# Patient Record
Sex: Male | Born: 1935 | Race: White | Hispanic: No | Marital: Married | Smoking: Never smoker
Health system: Southern US, Community
[De-identification: ages and names within clinical notes are randomized; demographics above are authoritative.]

## PROBLEM LIST (undated history)

## (undated) DIAGNOSIS — T4145XA Adverse effect of unspecified anesthetic, initial encounter: Secondary | ICD-10-CM

## (undated) DIAGNOSIS — M1710 Unilateral primary osteoarthritis, unspecified knee: Secondary | ICD-10-CM

## (undated) DIAGNOSIS — I259 Chronic ischemic heart disease, unspecified: Secondary | ICD-10-CM

## (undated) DIAGNOSIS — I10 Essential (primary) hypertension: Secondary | ICD-10-CM

## (undated) DIAGNOSIS — I4510 Unspecified right bundle-branch block: Secondary | ICD-10-CM

## (undated) DIAGNOSIS — H9190 Unspecified hearing loss, unspecified ear: Secondary | ICD-10-CM

## (undated) DIAGNOSIS — R600 Localized edema: Secondary | ICD-10-CM

## (undated) DIAGNOSIS — M179 Osteoarthritis of knee, unspecified: Secondary | ICD-10-CM

## (undated) DIAGNOSIS — B958 Unspecified staphylococcus as the cause of diseases classified elsewhere: Secondary | ICD-10-CM

## (undated) DIAGNOSIS — C801 Malignant (primary) neoplasm, unspecified: Secondary | ICD-10-CM

## (undated) DIAGNOSIS — J9601 Acute respiratory failure with hypoxia: Secondary | ICD-10-CM

## (undated) DIAGNOSIS — R0600 Dyspnea, unspecified: Secondary | ICD-10-CM

## (undated) DIAGNOSIS — Z87438 Personal history of other diseases of male genital organs: Secondary | ICD-10-CM

## (undated) DIAGNOSIS — N189 Chronic kidney disease, unspecified: Secondary | ICD-10-CM

## (undated) DIAGNOSIS — I2510 Atherosclerotic heart disease of native coronary artery without angina pectoris: Secondary | ICD-10-CM

## (undated) DIAGNOSIS — R0609 Other forms of dyspnea: Secondary | ICD-10-CM

## (undated) DIAGNOSIS — T8859XA Other complications of anesthesia, initial encounter: Secondary | ICD-10-CM

## (undated) DIAGNOSIS — J101 Influenza due to other identified influenza virus with other respiratory manifestations: Secondary | ICD-10-CM

## (undated) DIAGNOSIS — R609 Edema, unspecified: Secondary | ICD-10-CM

## (undated) DIAGNOSIS — N289 Disorder of kidney and ureter, unspecified: Secondary | ICD-10-CM

## (undated) DIAGNOSIS — I509 Heart failure, unspecified: Secondary | ICD-10-CM

## (undated) DIAGNOSIS — I214 Non-ST elevation (NSTEMI) myocardial infarction: Secondary | ICD-10-CM

## (undated) DIAGNOSIS — J189 Pneumonia, unspecified organism: Secondary | ICD-10-CM

## (undated) DIAGNOSIS — J45909 Unspecified asthma, uncomplicated: Secondary | ICD-10-CM

## (undated) DIAGNOSIS — I252 Old myocardial infarction: Secondary | ICD-10-CM

## (undated) HISTORY — PX: TOE AMPUTATION: SHX809

## (undated) HISTORY — PX: BILATERAL CARPAL TUNNEL RELEASE: SHX6508

## (undated) HISTORY — PX: JOINT REPLACEMENT: SHX530

## (undated) HISTORY — DX: Other forms of dyspnea: R06.09

## (undated) HISTORY — DX: Unspecified right bundle-branch block: I45.10

## (undated) HISTORY — DX: Edema, unspecified: R60.9

## (undated) HISTORY — PX: TRANSURETHRAL RESECTION OF PROSTATE: SHX73

## (undated) HISTORY — PX: FRACTURE SURGERY: SHX138

## (undated) HISTORY — PX: BUNIONECTOMY: SHX129

## (undated) HISTORY — DX: Old myocardial infarction: I25.2

## (undated) HISTORY — DX: Localized edema: R60.0

## (undated) HISTORY — PX: TOTAL KNEE ARTHROPLASTY: SHX125

## (undated) HISTORY — DX: Personal history of other diseases of male genital organs: Z87.438

## (undated) HISTORY — PX: BACK SURGERY: SHX140

## (undated) HISTORY — DX: Dyspnea, unspecified: R06.00

## (undated) HISTORY — DX: Chronic kidney disease, unspecified: N18.9

## (undated) HISTORY — DX: Essential (primary) hypertension: I10

## (undated) HISTORY — DX: Chronic ischemic heart disease, unspecified: I25.9

## (undated) HISTORY — PX: OTHER SURGICAL HISTORY: SHX169

## (undated) HISTORY — DX: Unilateral primary osteoarthritis, unspecified knee: M17.10

## (undated) HISTORY — DX: Osteoarthritis of knee, unspecified: M17.9

---

## 1996-01-21 HISTORY — PX: KNEE SURGERY: SHX244

## 1997-07-28 ENCOUNTER — Ambulatory Visit (HOSPITAL_COMMUNITY): Admission: RE | Admit: 1997-07-28 | Discharge: 1997-07-28 | Payer: Self-pay | Admitting: Orthopedic Surgery

## 1998-02-16 ENCOUNTER — Encounter: Payer: Self-pay | Admitting: Neurosurgery

## 1998-02-20 ENCOUNTER — Ambulatory Visit (HOSPITAL_COMMUNITY): Admission: RE | Admit: 1998-02-20 | Discharge: 1998-02-20 | Payer: Self-pay | Admitting: Neurosurgery

## 1998-07-03 ENCOUNTER — Ambulatory Visit (HOSPITAL_COMMUNITY): Admission: RE | Admit: 1998-07-03 | Discharge: 1998-07-04 | Payer: Self-pay | Admitting: Neurosurgery

## 1999-03-31 ENCOUNTER — Encounter: Payer: Self-pay | Admitting: Neurosurgery

## 1999-03-31 ENCOUNTER — Ambulatory Visit (HOSPITAL_COMMUNITY): Admission: RE | Admit: 1999-03-31 | Discharge: 1999-03-31 | Payer: Self-pay | Admitting: Neurosurgery

## 1999-04-12 ENCOUNTER — Encounter: Payer: Self-pay | Admitting: Neurosurgery

## 1999-04-16 ENCOUNTER — Encounter: Payer: Self-pay | Admitting: Neurosurgery

## 1999-04-16 ENCOUNTER — Inpatient Hospital Stay (HOSPITAL_COMMUNITY): Admission: RE | Admit: 1999-04-16 | Discharge: 1999-04-18 | Payer: Self-pay | Admitting: Neurosurgery

## 1999-04-21 ENCOUNTER — Emergency Department (HOSPITAL_COMMUNITY): Admission: EM | Admit: 1999-04-21 | Discharge: 1999-04-21 | Payer: Self-pay | Admitting: Emergency Medicine

## 1999-04-21 ENCOUNTER — Encounter: Payer: Self-pay | Admitting: Emergency Medicine

## 1999-05-01 ENCOUNTER — Encounter: Admission: RE | Admit: 1999-05-01 | Discharge: 1999-05-01 | Payer: Self-pay | Admitting: Neurosurgery

## 1999-05-01 ENCOUNTER — Encounter: Payer: Self-pay | Admitting: Neurosurgery

## 2000-12-15 ENCOUNTER — Encounter: Payer: Self-pay | Admitting: Family Medicine

## 2000-12-15 ENCOUNTER — Ambulatory Visit (HOSPITAL_COMMUNITY): Admission: RE | Admit: 2000-12-15 | Discharge: 2000-12-15 | Payer: Self-pay | Admitting: Family Medicine

## 2003-06-23 ENCOUNTER — Ambulatory Visit (HOSPITAL_COMMUNITY): Admission: RE | Admit: 2003-06-23 | Discharge: 2003-06-23 | Payer: Self-pay | Admitting: Neurosurgery

## 2003-07-04 ENCOUNTER — Inpatient Hospital Stay (HOSPITAL_COMMUNITY): Admission: RE | Admit: 2003-07-04 | Discharge: 2003-07-07 | Payer: Self-pay | Admitting: Neurosurgery

## 2003-10-14 ENCOUNTER — Ambulatory Visit (HOSPITAL_COMMUNITY): Admission: RE | Admit: 2003-10-14 | Discharge: 2003-10-14 | Payer: Self-pay | Admitting: Neurosurgery

## 2003-10-22 ENCOUNTER — Ambulatory Visit: Payer: Self-pay | Admitting: Internal Medicine

## 2003-10-22 ENCOUNTER — Inpatient Hospital Stay (HOSPITAL_COMMUNITY): Admission: AD | Admit: 2003-10-22 | Discharge: 2003-10-28 | Payer: Self-pay | Admitting: Neurosurgery

## 2003-11-28 ENCOUNTER — Ambulatory Visit: Payer: Self-pay | Admitting: Internal Medicine

## 2004-01-19 ENCOUNTER — Inpatient Hospital Stay (HOSPITAL_COMMUNITY): Admission: RE | Admit: 2004-01-19 | Discharge: 2004-01-23 | Payer: Self-pay | Admitting: Neurosurgery

## 2004-07-11 ENCOUNTER — Other Ambulatory Visit: Admission: RE | Admit: 2004-07-11 | Discharge: 2004-07-11 | Payer: Self-pay | Admitting: Dermatology

## 2004-08-20 ENCOUNTER — Encounter: Admission: RE | Admit: 2004-08-20 | Discharge: 2004-08-20 | Payer: Self-pay | Admitting: Otolaryngology

## 2004-10-07 ENCOUNTER — Ambulatory Visit: Payer: Self-pay | Admitting: Internal Medicine

## 2004-11-13 ENCOUNTER — Inpatient Hospital Stay (HOSPITAL_COMMUNITY): Admission: RE | Admit: 2004-11-13 | Discharge: 2004-11-16 | Payer: Self-pay | Admitting: Orthopedic Surgery

## 2005-01-07 ENCOUNTER — Ambulatory Visit: Payer: Self-pay | Admitting: Internal Medicine

## 2005-04-22 ENCOUNTER — Ambulatory Visit: Payer: Self-pay | Admitting: Internal Medicine

## 2005-09-30 ENCOUNTER — Ambulatory Visit (HOSPITAL_COMMUNITY): Admission: RE | Admit: 2005-09-30 | Discharge: 2005-09-30 | Payer: Self-pay | Admitting: Neurosurgery

## 2005-10-21 ENCOUNTER — Inpatient Hospital Stay (HOSPITAL_COMMUNITY): Admission: RE | Admit: 2005-10-21 | Discharge: 2005-10-25 | Payer: Self-pay | Admitting: Neurosurgery

## 2006-02-25 ENCOUNTER — Encounter (HOSPITAL_COMMUNITY): Admission: RE | Admit: 2006-02-25 | Discharge: 2006-03-27 | Payer: Self-pay | Admitting: Neurosurgery

## 2006-04-07 ENCOUNTER — Encounter (HOSPITAL_COMMUNITY): Admission: RE | Admit: 2006-04-07 | Discharge: 2006-05-07 | Payer: Self-pay | Admitting: Neurosurgery

## 2006-05-08 ENCOUNTER — Encounter (HOSPITAL_COMMUNITY): Admission: RE | Admit: 2006-05-08 | Discharge: 2006-06-07 | Payer: Self-pay | Admitting: Neurosurgery

## 2006-08-24 ENCOUNTER — Ambulatory Visit: Payer: Self-pay | Admitting: Internal Medicine

## 2006-08-25 ENCOUNTER — Ambulatory Visit: Payer: Self-pay | Admitting: Internal Medicine

## 2006-10-16 ENCOUNTER — Ambulatory Visit: Payer: Self-pay | Admitting: Internal Medicine

## 2006-10-23 ENCOUNTER — Encounter: Admission: RE | Admit: 2006-10-23 | Discharge: 2006-10-23 | Payer: Self-pay | Admitting: Cardiology

## 2007-03-21 DIAGNOSIS — I252 Old myocardial infarction: Secondary | ICD-10-CM

## 2007-03-21 HISTORY — DX: Old myocardial infarction: I25.2

## 2007-03-23 ENCOUNTER — Encounter: Payer: Self-pay | Admitting: Cardiology

## 2007-03-23 ENCOUNTER — Inpatient Hospital Stay (HOSPITAL_COMMUNITY): Admission: AD | Admit: 2007-03-23 | Discharge: 2007-03-28 | Payer: Self-pay | Admitting: Cardiology

## 2007-03-24 HISTORY — PX: CARDIAC CATHETERIZATION: SHX172

## 2007-04-15 ENCOUNTER — Encounter (HOSPITAL_COMMUNITY): Admission: RE | Admit: 2007-04-15 | Discharge: 2007-05-15 | Payer: Self-pay | Admitting: *Deleted

## 2007-05-17 ENCOUNTER — Encounter (HOSPITAL_COMMUNITY): Admission: RE | Admit: 2007-05-17 | Discharge: 2007-06-16 | Payer: Self-pay | Admitting: Cardiology

## 2007-06-18 ENCOUNTER — Encounter (HOSPITAL_COMMUNITY): Admission: RE | Admit: 2007-06-18 | Discharge: 2007-07-18 | Payer: Self-pay | Admitting: Cardiology

## 2007-07-19 ENCOUNTER — Encounter (HOSPITAL_COMMUNITY): Admission: RE | Admit: 2007-07-19 | Discharge: 2007-08-18 | Payer: Self-pay | Admitting: Cardiology

## 2008-02-28 ENCOUNTER — Ambulatory Visit (HOSPITAL_COMMUNITY): Admission: RE | Admit: 2008-02-28 | Discharge: 2008-02-28 | Payer: Self-pay | Admitting: Neurosurgery

## 2008-04-14 ENCOUNTER — Inpatient Hospital Stay (HOSPITAL_COMMUNITY): Admission: RE | Admit: 2008-04-14 | Discharge: 2008-04-15 | Payer: Self-pay | Admitting: Neurosurgery

## 2008-11-03 ENCOUNTER — Inpatient Hospital Stay (HOSPITAL_COMMUNITY): Admission: RE | Admit: 2008-11-03 | Discharge: 2008-11-06 | Payer: Self-pay | Admitting: Orthopedic Surgery

## 2009-03-22 ENCOUNTER — Encounter (HOSPITAL_COMMUNITY): Admission: RE | Admit: 2009-03-22 | Discharge: 2009-04-21 | Payer: Self-pay | Admitting: Orthopedic Surgery

## 2009-04-02 HISTORY — PX: CARDIOVASCULAR STRESS TEST: SHX262

## 2009-04-04 HISTORY — PX: TRANSTHORACIC ECHOCARDIOGRAM: SHX275

## 2009-12-05 ENCOUNTER — Ambulatory Visit: Payer: Self-pay | Admitting: Cardiology

## 2010-02-09 ENCOUNTER — Encounter: Payer: Self-pay | Admitting: Family Medicine

## 2010-04-04 ENCOUNTER — Ambulatory Visit (INDEPENDENT_AMBULATORY_CARE_PROVIDER_SITE_OTHER): Payer: PRIVATE HEALTH INSURANCE | Admitting: Cardiology

## 2010-04-04 DIAGNOSIS — I119 Hypertensive heart disease without heart failure: Secondary | ICD-10-CM

## 2010-04-04 DIAGNOSIS — E7800 Pure hypercholesterolemia, unspecified: Secondary | ICD-10-CM

## 2010-04-04 DIAGNOSIS — Z79899 Other long term (current) drug therapy: Secondary | ICD-10-CM

## 2010-04-25 LAB — TYPE AND SCREEN: ABO/RH(D): O POS

## 2010-04-25 LAB — COMPREHENSIVE METABOLIC PANEL
ALT: 26 U/L (ref 0–53)
Alkaline Phosphatase: 133 U/L — ABNORMAL HIGH (ref 39–117)
CO2: 28 mEq/L (ref 19–32)
Chloride: 105 mEq/L (ref 96–112)
Glucose, Bld: 106 mg/dL — ABNORMAL HIGH (ref 70–99)
Potassium: 4.7 mEq/L (ref 3.5–5.1)
Sodium: 141 mEq/L (ref 135–145)
Total Protein: 6.7 g/dL (ref 6.0–8.3)

## 2010-04-25 LAB — CBC
Hemoglobin: 14.4 g/dL (ref 13.0–17.0)
MCHC: 33.6 g/dL (ref 30.0–36.0)
Platelets: 143 10*3/uL — ABNORMAL LOW (ref 150–400)
RBC: 2.78 MIL/uL — ABNORMAL LOW (ref 4.22–5.81)
RBC: 2.84 MIL/uL — ABNORMAL LOW (ref 4.22–5.81)
RBC: 3.14 MIL/uL — ABNORMAL LOW (ref 4.22–5.81)
RBC: 4.46 MIL/uL (ref 4.22–5.81)
RDW: 15.1 % (ref 11.5–15.5)
RDW: 14.3 % (ref 11.5–15.5)
WBC: 6 10*3/uL (ref 4.0–10.5)
WBC: 7.1 10*3/uL (ref 4.0–10.5)
WBC: 6.3 10*3/uL (ref 4.0–10.5)

## 2010-04-25 LAB — BASIC METABOLIC PANEL
BUN: 46 mg/dL — ABNORMAL HIGH (ref 6–23)
CO2: 23 mEq/L (ref 19–32)
Calcium: 7.6 mg/dL — ABNORMAL LOW (ref 8.4–10.5)
Creatinine, Ser: 2.63 mg/dL — ABNORMAL HIGH (ref 0.4–1.5)
Creatinine, Ser: 2.8 mg/dL — ABNORMAL HIGH (ref 0.4–1.5)
GFR calc Af Amer: 27 mL/min — ABNORMAL LOW (ref >60)
GFR calc Af Amer: 29 mL/min — ABNORMAL LOW (ref >60)
GFR calc non Af Amer: 22 mL/min — ABNORMAL LOW (ref >60)
GFR calc non Af Amer: 24 mL/min — ABNORMAL LOW (ref >60)
Potassium: 4 mEq/L (ref 3.5–5.1)
Sodium: 139 mEq/L (ref 135–145)

## 2010-04-25 LAB — URINALYSIS, ROUTINE W REFLEX MICROSCOPIC
Glucose, UA: NEGATIVE mg/dL
Protein, ur: 30 mg/dL — AB

## 2010-04-25 LAB — PROTIME-INR
INR: 1.37 (ref 0.00–1.49)
INR: 2.49 — ABNORMAL HIGH (ref 0.00–1.49)
INR: 2.54 — ABNORMAL HIGH (ref 0.00–1.49)
Prothrombin Time: 16.8 seconds — ABNORMAL HIGH (ref 11.6–15.2)
Prothrombin Time: 26.7 seconds — ABNORMAL HIGH (ref 11.6–15.2)
Prothrombin Time: 27.1 seconds — ABNORMAL HIGH (ref 11.6–15.2)
Prothrombin Time: 13.9 seconds (ref 11.6–15.2)

## 2010-04-25 LAB — POCT I-STAT 4, (NA,K, GLUC, HGB,HCT)
Glucose, Bld: 123 mg/dL — ABNORMAL HIGH (ref 70–99)
HCT: 29 % — ABNORMAL LOW (ref 39.0–52.0)
Hemoglobin: 9.9 g/dL — ABNORMAL LOW (ref 13.0–17.0)

## 2010-04-25 LAB — PREPARE RBC (CROSSMATCH)

## 2010-04-25 LAB — URINE MICROSCOPIC-ADD ON

## 2010-05-02 LAB — COMPREHENSIVE METABOLIC PANEL
AST: 29 U/L (ref 0–37)
Albumin: 3.8 g/dL (ref 3.5–5.2)
Alkaline Phosphatase: 130 U/L — ABNORMAL HIGH (ref 39–117)
BUN: 34 mg/dL — ABNORMAL HIGH (ref 6–23)
CO2: 30 mEq/L (ref 19–32)
Chloride: 106 mEq/L (ref 96–112)
GFR calc non Af Amer: 27 mL/min — ABNORMAL LOW (ref >60)
Potassium: 5.2 mEq/L — ABNORMAL HIGH (ref 3.5–5.1)
Total Bilirubin: 0.7 mg/dL (ref 0.3–1.2)

## 2010-05-02 LAB — CBC
HCT: 43.8 % (ref 39.0–52.0)
Platelets: 209 10*3/uL (ref 150–400)
RBC: 4.54 MIL/uL (ref 4.22–5.81)
WBC: 6.3 10*3/uL (ref 4.0–10.5)

## 2010-05-02 LAB — DIFFERENTIAL
Basophils Absolute: 0.1 10*3/uL (ref 0.0–0.1)
Basophils Relative: 1 % (ref 0–1)
Eosinophils Relative: 3 % (ref 0–5)
Monocytes Absolute: 0.6 10*3/uL (ref 0.1–1.0)

## 2010-05-02 LAB — URINALYSIS, ROUTINE W REFLEX MICROSCOPIC
Bilirubin Urine: NEGATIVE
Glucose, UA: NEGATIVE mg/dL
Hgb urine dipstick: NEGATIVE
Ketones, ur: NEGATIVE mg/dL
pH: 6 (ref 5.0–8.0)

## 2010-05-02 LAB — POCT I-STAT 4, (NA,K, GLUC, HGB,HCT)
Glucose, Bld: 105 mg/dL — ABNORMAL HIGH (ref 70–99)
HCT: 44 % (ref 39.0–52.0)
Hemoglobin: 15 g/dL (ref 13.0–17.0)
Potassium: 4.4 mEq/L (ref 3.5–5.1)

## 2010-05-16 ENCOUNTER — Other Ambulatory Visit: Payer: Self-pay | Admitting: Cardiology

## 2010-05-16 DIAGNOSIS — I259 Chronic ischemic heart disease, unspecified: Secondary | ICD-10-CM

## 2010-05-16 DIAGNOSIS — I10 Essential (primary) hypertension: Secondary | ICD-10-CM

## 2010-05-17 NOTE — Telephone Encounter (Signed)
escribe request  

## 2010-05-31 ENCOUNTER — Other Ambulatory Visit: Payer: Self-pay | Admitting: *Deleted

## 2010-05-31 DIAGNOSIS — Z01818 Encounter for other preprocedural examination: Secondary | ICD-10-CM

## 2010-05-31 DIAGNOSIS — I252 Old myocardial infarction: Secondary | ICD-10-CM

## 2010-06-04 NOTE — Assessment & Plan Note (Signed)
[facility]                             PULMONARY OFFICE NOTE   NAME:[name] [name] LASHLEY                      MRN:          [id number]  DATE:10/16/2006                            DOB:          May 07, 1928    PROBLEM:  1. Chronic bronchitis.  2. History of lymphoma.  3. Question mediastinal adenopathy.  4. Allergic rhinitis.   HISTORY:  He and his wife indicate that he has really done pretty well  this summer with no special flare-up in the harvest season, apparently  because it has been wetter.  Less routine cough or phlegm.  He remembers  Advair as not helpful in the past and we note again his history of  childhood pneumonia.   MEDICATIONS:  1. Ziac 5 mg.  2. Allopurinol 100 mg.   ALLERGIES:  No medication allergy.   Chest x-ray:  August 24, 2006 film - no acute findings.  Lungs are  somewhat low-volume with left-base scarring.  Surgical changes in the  spine.   Pulmonary function tests on August 25, 2006, showed moderate obstructive  airways disease, especially in small area where there was response to  bronchodilator.  Measured lung volumes were normal.  Diffusion capacity  was normal.   On a six-minute walk test, he went 333 meters with normal heart rate and  oxygenation.  He held his oxygen saturation 96-98% throughout.   OBJECTIVE:  Weight 170 pounds, BP 140/82, pulse 54, room air saturation  99%.  Chronic postoperative changes on the right side of the face  persist.  Heart sounds regular.  Lungs are clear with somewhat  diminished breath sounds.   IMPRESSION:  1. History of lymphoma.  2. Asthmatic bronchitis/COPD, clinically stable.  3. Allergic rhinitis.   PLAN:  Sample albuterol inhaler with education and discussion for trial.  Flu vaccine was discussed and given.  Schedule return one year, earlier  p.r.n.     Matthew D. Maple Hudson, MD, [name] [name], FACP  Electronically Signed    CDY/MedQ  DD: 10/16/2006  DT: 10/17/2006  Job #:  [id number]   cc:   [name] [name], M.D.  [name] Callander. Ouida Sills, MD

## 2010-06-04 NOTE — Discharge Summary (Signed)
[name], [name]               ACCOUNT NO.:  [id number]   MEDICAL RECORD NO.:  [id number]          PATIENT TYPE:  INP   LOCATION:  4715                         FACILITY:  [facility]   PHYSICIAN:  [name], M.D. DATE OF BIRTH:  09/10/1928   DATE OF ADMISSION:  03/23/2007  DATE OF DISCHARGE:  03/28/2007                               DISCHARGE SUMMARY   FINAL DIAGNOSES:  1. Acute anterior wall myocardial infarction, with late presentation.  2. Chronic right bundle branch block.  3. Chronic renal insufficiency.  4. History of gout.  5. History of multiple previous operations on his spine.  6. Remote history of lymphoma of the right temporal area in 1979.   OPERATIONS PERFORMED:  1. Echocardiogram.  2. Left heart cardiac catheterization on March 24, 2007 by Dr. Roger Shelter, with no left ventricular angiogram performed because of      renal insufficiency.   HISTORY:  This is a 77 year old married Caucasian gentleman from  [place] who is admitted with chest pain and a suspected recent  anterior myocardial infarction.  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  He has not had any history of known coronary  artery disease.  In September 2006, he had a normal adenosine Cardiolite  stress test as preoperative clearance for hip surgery.  At that time, he  was noted to have a right bundle branch block, and his ejection fraction  at that time was 59%.  The patient had an episode of chest tightness  which prevented him from falling asleep on Friday evening, March 19, 2007.  He was in [place] visiting relatives at that time.  He drove  back to [place] the following day.  On March 22, 2006, he again  had chest discomfort in the evening and was able to fall asleep because  of chest tightness.  The discomfort  lasted most of the night, and he  came to the office as a work-in the next day, and his EKG showed  evidence of a new anterior wall myocardial infarction, with Q-waves  already present in leads V1-V3.   SOCIAL HISTORY:  Reveals that he does not drink alcohol or smoke.  He is  a retired Engineer, maintenance.  He is married, and his wife as a Engineer, civil (consulting).   FAMILY HISTORY:  Reveals his father died at 79 and had a pacemaker.  Mother died at 53 and had coronary disease.   PHYSICAL EXAMINATION ON ADMISSION IN THE OFFICE:  VITAL SIGNS:  Blood  pressure 130/84, pulse of 60 and regular, respirations are normal.  Jugular venous pressure normal.  HEAD/NECK:  Revealed scarring in the right temporal area from the  previous lymphoma excision.  CHEST:  Clear to percussion and auscultation.  HEART:  Reveals no murmur, gallop, rub, or click.  ABDOMEN:  Reveals no hepatosplenomegaly or mass.  EXTREMITIES:  Show 1+ pedal pulses.  No phlebitis or edema.  HOSPITAL COURSE:  The patient was admitted from my office to telemetry.  He was treated with IV nitroglycerin, IV heparin, Lopressor, and  aspirin.  He was begun on a Mucomyst protocol to prepare him for cardiac  catheterization the following day.  His Ziac was held.  Because of his  renal insufficiency and the risk of angiographic dye, we limited his  cardiac catheterization to coronaries only.  His left ventricular  function was assessed by echocardiogram on the afternoon of admission,  and it did show akinesis of the anterior wall, consistent with a recent  anterior wall myocardial infarction.  On the day after admission, after  IV hydration and after Mucomyst protocol, the patient underwent cardiac  catheterization, which showed that he did have a 100% occlusion of his  LAD before the first septal perforator.  He did not have any significant  obstructive disease of his other vessels.  There was collateral filling  from a large dominant right coronary artery,  with two large vessels on  the inferior wall supplying collaterals to the LAD, with incomplete  filling.  It was felt that medical management would be prudent.  The  patient was continued on aspirin and Plavix.  Postcatheterization, the  patient did well,  He did not have any groin complications.  His renal  function had improved prior to catheterization, with creatinine dropping  to 1.92.  At the time of discharge, BUN is 38, creatinine 2.25,  potassium 4.2.  The patient's activity was gradually increased.  He did  not show any evidence of congestive heart failure, and he had no  arrhythmias.  Cardiac rehab work with him, and he did well.  The plan  will be that he will join the [facility] cardiac rehab program in  several weeks.   By March 28, 2007, he was felt stable for discharge.  Chest x-ray that  morning showed no evidence of CHF, and his B natruretic peptide had  dropped to 945.   The patient is to increase activity slowly at home.  He may walk up  steps.  He is not to drive for several weeks.  He is to see Dr.  Patty Sermons in several weeks for a follow-up office visit, EKG, CBC, and  BMET.  We will call him with that appointment.   DISCHARGE MEDICATIONS:  1. Metoprolol 25 mg 1/2 tablet twice a day.  2. Coated aspirin 325, 1 daily.  3. Colchicine 0.6 mg daily.  4. Lipitor 80 mg daily.  5. Imdur 30 mg each morning.  6. Plavix 75 mg daily.  7. Nitrostat 1/50 sublingually p.r.n.  8. Allopurinol 100 mg daily.  9. Tylenol p.r.n. for pain.  10.He is to stop taking his Ziac.   CONDITION ON DISCHARGE:  Improved.           ______________________________  [name], M.D.     TB/MEDQ  D:  03/28/2007  T:  03/29/2007  Job:  [id number]   cc:   Colleen Can. Deborah Chalk, M.D.  Payton Doughty, M.D.

## 2010-06-04 NOTE — H&P (Signed)
NAMEDANDRE, SISLER               ACCOUNT NO.:  [id number]   MEDICAL RECORD NO.:  [id number]          PATIENT TYPE:  AMB   LOCATION:  SDS                          FACILITY:  [facility]   PHYSICIAN:  Payton Doughty, M.D.      DATE OF BIRTH:  November 01, 1928   DATE OF ADMISSION:  04/14/2008  DATE OF DISCHARGE:                              HISTORY & PHYSICAL   ADMISSION DIAGNOSIS:  Right peroneal neuropathy.   BODY OF TEXT:  This is a 77 year old gentleman who has had anterior  decompression and fusion, lumbar decompression and fusion, infected hip  replacement, redo hip replacement.  He has been developing dorsiflexion  and weakness on the right side.  MRI and CT of his spine did show any  compressive pathology.  EMG and nerve conduction studies demonstrated  pronator neuropathy at the fibular head on the right and he is now  admitted for peroneal nerve decompression.   MEDICAL HISTORY:  Remarkable for coronary artery disease.  He has had an  MI about a year ago.   He is on Toprol and several other cardiac medicines.   He is allergic to GENTAMICIN.   He had a history of renal failure secondary to gentamicin when he had  his infected hip.   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   REVIEW OF SYSTEMS:  Remarkable for leg pain, dorsiflexion, and weakness  on the right side.   PHYSICAL EXAMINATION:  HEENT:  Within normal limits.  NECK:  He has good range of motion in his neck.  CHEST:  Clear.  CARDIAC:  Bradycardic, but regular at this time.  ABDOMEN:  Nontender with hepatosplenomegaly.  EXTREMITIES:  No clubbing or cyanosis.  GU:  Deferred.  Peripheral pulses are good.  NEUROLOGIC:  He is awake, alert, and oriented.  Cranial nerves are  intact.  Motor exam shows 5/5 strength throughout the upper extremities,  save for the grip where he has bilateral interosseous weakness.  He has  had an ulnar nerve transposition as well.  LOWER EXTREMITIES:  Left  lower is pretty good strength.  Right side, he  has about 4/5 weakness in the dorsiflexors in the right and foot slap  when he walks.  This is all obviously complicated by his hip, with  limited range of motion of his right hip.  Although it is not bothering  him very much now.   CLINICAL IMPRESSION:  Right peroneal neuropathy.   PLAN:  Decompression of peroneal nerve with fibular head.  The risks and  benefits have been discussed and they wished to proceed.           ______________________________  Payton Doughty, M.D.     MWR/MEDQ  D:  04/14/2008  T:  04/14/2008  Job:  [id number]

## 2010-06-04 NOTE — Cardiovascular Report (Signed)
NAMEMOOSA, BUECHE               ACCOUNT NO.:  [id number]   MEDICAL RECORD NO.:  [id number]          PATIENT TYPE:  INP   LOCATION:  4715                         FACILITY:  [facility]   PHYSICIAN:  Colleen Can. Deborah Chalk, M.D.DATE OF BIRTH:  07/27/1928   DATE OF PROCEDURE:  03/24/2007  DATE OF DISCHARGE:                            CARDIAC CATHETERIZATION   PROCEDURE:  Left heart catheterization with coronary arteriograms.  No  left ventricular angiogram performed.   SITE:  Right femoral artery.   CATHETERS:  5-French, four curved Judkins left and right coronary  catheters; 5-French pigtail ventriculographic catheter.   CONTRAST MATERIAL:  Omnipaque.   MEDICATIONS PRIOR TO PROCEDURE:  Valium 10 mg.   MEDICATIONS DURING PROCEDURE:  None.   COMMENT:  The patient tolerated the procedure well.   HEMODYNAMIC DATA:  The aortic pressure was 134/77, LV is 127/15-26.   ANGIOGRAPHIC DATA:  1. Left main coronary artery is normal.  2. Left anterior descending is totally occluded before the first      septal perforating branch.  There are left-to-left collaterals with      intermittent filling of the left anterior descending as well as      right to left collaterals.  In a somewhat intermittent fashion, we      have filling of the distal left anterior descending.  3. Left circumflex continues predominantly as a relatively large,      trifurcating obtuse marginal branch.  There is 30% narrowing      approximately 2-cm from the origin.  At the level of the      trifurcation, there is another 30-40% narrowing.  There is some      scattered irregularities at the level of the trifurcation, but no      significant obstructive disease is present in the left circumflex.  4. Right coronary artery.  The right coronary artery is a very large      dominant vessel.  He has to large branches on the inferior wall.      There is a large posterior lateral branch and large posterior      descending vessel and  then at least three smaller branches on the      inferior wall.  At the level of the ostium of the posterior      descending branch, there appears to be a 30% narrowing.  There is      collaterals to the left anterior descending predominantly through      the septal perforating branches.  5. Left main coronary artery is normal.   IMPRESSION:  1. Totally occluded left anterior descending proximally before the      first septal perforating branch.  2. Mild coronary atherosclerosis in the right coronary artery and left      circumflex with incomplete collateral flow to the left anterior      descending.   DISCUSSION:  In light of the reports of the echocardiogram with anterior  wall being nonfunctioning, I think that we are basically dealing with a  completed anterior myocardial infarction of relatively significant size.  Unless there are  recurrent symptoms or objective signs of ischemia, I do  not think much will be accomplished with recanalization at this point 5  days out from his myocardial infarction.      Colleen Can. Deborah Chalk, M.D.  Electronically Signed     SNT/MEDQ  D:  03/24/2007  T:  03/25/2007  Job:  [id number]

## 2010-06-04 NOTE — H&P (Signed)
[name], [name]               ACCOUNT NO.:  [id number]   MEDICAL RECORD NO.:  [id number]          PATIENT TYPE:  INP   LOCATION:  4715                         FACILITY:  [facility]   PHYSICIAN:  [name], M.D. DATE OF BIRTH:  10-28-1928   DATE OF ADMISSION:  03/23/2007  DATE OF DISCHARGE:                              HISTORY & PHYSICAL   CHIEF COMPLAINT:  Chest pain.   HISTORY:  This is a 77 year old married Caucasian gentleman admitted  with chest pain and suspected recent anteroseptal myocardial infarction.  He has a past history of essential hypertension and history of chronic  renal insufficiency secondary to prior high-dose gentamicin for  osteomyelitis of his lumbar spine in 1978.  He does not have any history  of known coronary artery disease.  On October 10, 2004, he had a  normal adenosine Cardiolite stress test as preoperative clearance for  hip surgery, and at that time he did have a right bundle branch block  and had left ventricular ejection fraction of 59%.  Last Friday,  March 19, 2007, he was in [place] with his wife.  He was unable to  fall asleep because of chest tightness.  He was able to drive back to  [place] the following day.  Last night again he had difficulty  sleeping because of chest pressure and tightness. There was no dyspnea  at the time, no nausea, vomiting or diaphoresis.  He does give a history  of over the past several months having some occasional chest tightness  when he walks.  The patient came to the office today because of last  night's chest pain, and his EKG showed right bundle branch block with  new ST-segment elevation in the inner septal leads and new Q waves in V1-  V3.   PRESENT MEDICATIONS:  1. Ziac 5 mg daily.  2. Allegra 180 mg p.r.n.  3. Allopurinol 100 mg daily.  4. Colchicine 0.6 mg daily.  5. He also has some eye drops from his eye doctor on hand for p.r.n.      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   PAST MEDICAL HISTORY:  Positive for hypertension and renal  insufficiency. His serum creatinine is usually around 2.2 with a BUN in  the high 40s or low 50s.   PAST SURGICAL HISTORY:  Is extensive. He estimates he has had about 24  surgeries since 1978. He did have removal of an lymphoma from his right  temporal artery area in 1979, and this was treated at [facility] initially  with removal of the lymphoma tissue followed by radiation and followed  by chemotherapy. He has had multiple orthopedic procedures, most  recently a right hip replacement by Dr. Lequita Halt in October 2006. He had  a TURP in 1998 per Dr. Darvin Neighbours. He has had right total hip replacement  in 1983 with revision in 1994. He had carpal tunnel release on the left  in 1997. He had left total knee replacement in 1998.  He had bunion  surgery 1999, hand surgery in 2000, left arm surgery in 2000, three-  level  cervical fusion in 2001, right ankle surgery in 2002, thoracic and  lumbar surgery in 2005, amputation of the left fourth toe in 2005, and  second lumbar surgery with fusion and screws and ray cages in December  2005.   SOCIAL HISTORY:  Reveals that he is married.  He is a nonsmoker.  Does  not drink any alcohol.  He has four living children, and one child died  of an accident.   FAMILY HISTORY:  Reveals that the patient's father died at 68 and had a  pacemaker.  Mother died at age 51 and had coronary disease.   ALLERGIES:  None except for GENTAMICIN.   REVIEW OF SYSTEMS:  He denies any change in bowel habits, hematochezia  or melena.  He does have urinary frequency and nocturia x3.  He denies  any cough or sputum production.  Remainder of Review of Systems negative  in detail.   PHYSICAL EXAMINATION:  VITAL SIGNS:  Blood pressure 130/84, pulse 60 and  regular, respirations normal.  Jugular venous pressure normal.  HEAD AND NECK:  Reveals scarring in the right temple area from the area  of the previous lymphoma  removal.  LUNGS:  Clear to auscultation.  HEART:  Reveals no murmur, gallop, rub or click.  ABDOMEN:  Reveals no hepatosplenomegaly or mass.  EXTREMITIES:  Show 1+ pedal pulses.  No edema.  No phlebitis.   His electrocardiogram shows normal sinus rhythm with a right bundle  branch block pattern and what appears to be a probably recent  anteroseptal myocardial infarction with Q-waves V1-V3 and slight ST  elevation in V1-V3.   IMPRESSION:  1. Chest pain, probable recent anteroseptal myocardial infarction.  2. Right bundle branch block present in 2006.  3. Chronic renal insufficiency   DISPOSITION:  1. We are admitting to telemetry.  2. Will give him treatment with IV nitroglycerin, IV heparin,      Lopressor, aspirin.  3. Will hold his Ziac and give him IV hydration.  4. Will use the Mucomyst protocol.  5. Will plan for cardiac catheterization Wednesday morning by Dr.      Deborah Chalk using coronaries only.  6. We will assess his LV function by echocardiogram.  7. Will get serial enzymes.           ______________________________  [name], M.D.     TB/MEDQ  D:  03/23/2007  T:  03/23/2007  Job:  [id number]   cc:   Colleen Can. Deborah Chalk, M.D.

## 2010-06-04 NOTE — Op Note (Signed)
NAMEAIVAN, [name] NO.:  [id number]   MEDICAL RECORD NO.:  [id number]          PATIENT TYPE:  OIB   LOCATION:  3524                         FACILITY:  [facility]   PHYSICIAN:  Payton Doughty, M.D.      DATE OF BIRTH:  1928-02-09   DATE OF PROCEDURE:  04/14/2008  DATE OF DISCHARGE:                               OPERATIVE REPORT   PREOPERATIVE DIAGNOSIS:  Right peroneal neuropathy.   POSTOPERATIVE DIAGNOSIS:  Right peroneal neuropathy.   PROCEDURE:  Right peroneal nerve release.   SURGEON:  Payton Doughty, MD, Neurosurgery.   ANESTHESIA:  LMA.   COMPLICATIONS:  None.   ASSISTANT:  Bedelia Person, MD   BODY OF TEXT:  This is a 77 year old gentleman with right peroneal  neuropathy.   Taken to the operating room, had LMA anesthesia induced.  Following  shave, prep, and drape in usual sterile fashion, the skin was incised in  a lazy-S, starting just short of the popliteal fossa, carried 2  fingerbreadths below the fibular head, and then down onto the peroneus  muscle.  The fascial plane was identified and divided.  The peroneal  nerve was identified as it crossed the head of the fibula.  It was  gently dissected free.  The lateral margin of the peroneus fascia was  divided and this was the point of compression of the nerve.  There was a  fair amount of fat around the nerve, also right until it got to the  peroneal fascia and at that point, the fat had been pushed out of the  way and the nerve itself appeared fairly pale, division of this lateral  margin allowed the nerve to relax and actually seemed to pink up some.  The nerve was explored out into the leg and then up into the popliteal  fossa and found to be free.  The wound was irrigated.  Hemostasis  assured.  Successive layers of 2-0 Vicryl and 3-0 nylon were used to  close.  Betadine and Telfa dressing was applied, made occlusive with  OpSite, and the patient returned to the recovery room in good condition.         ______________________________  Payton Doughty, M.D.     MWR/MEDQ  D:  04/14/2008  T:  04/14/2008  Job:  [id number]

## 2010-06-04 NOTE — Assessment & Plan Note (Signed)
[facility]                             PULMONARY OFFICE NOTE   NAME:[name], [name]                      MRN:          [id number]  DATE:08/24/2006                            DOB:          10/15/1928    PROBLEM:  1. Chronic bronchitis.  2. History of lymphoma.  3. Question of mediastinal adenopathy.  4. Allergic rhinitis.   HISTORY:  No real change.  Persistent cough producing clear sputum.  Nasal congestion.  He has had hip surgery and back surgery in the past  year.  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  There is no  history of cardiac disease.   MEDICATIONS:  1. Ziac 5 mg.  2. Allopurinol 100 mg.   ALLERGIES:  No medication allergy.   OBJECTIVE:  Weight 171 pounds.  BP 120/72, pulse 61, room air saturation  98%.  There are no minor crackles.  Heart sounds are regular without murmur.  No visible drainage.  Voice  quality is normal.  No peripheral edema.   IMPRESSION:  1. History of lymphoma.  2. Bronchitis with occupational/farming dust exposure.   PLAN:  1. PFT.  2. Chest x-ray.  3. Schedule return in 2 months, earlier p.r.n.     Clinton D. Maple Hudson, MD, Tonny Bollman, FACP  Electronically Signed    CDY/MedQ  DD: 08/24/2006  DT: 08/25/2006  Job #: [id number]   cc:   [name], M.D.  Kingsley Callander. Ouida Sills, MD

## 2010-06-07 NOTE — Op Note (Signed)
NAMEJAESHAUN, RIVA               ACCOUNT NO.:  [id number]   MEDICAL RECORD NO.:  [id number]          PATIENT TYPE:  INP   LOCATION:  3010                         FACILITY:  [facility]   PHYSICIAN:  Leonides Grills, M.D.     DATE OF BIRTH:  [birth year]   DATE OF PROCEDURE:  10/25/2003  DATE OF DISCHARGE:                                 OPERATIVE REPORT   PREOPERATIVE DIAGNOSIS:  Left fourth toe osteomyelitis.   POSTOPERATIVE DIAGNOSIS:  Left fourth toe osteomyelitis.   OPERATION PERFORMED:  Left fourth toe amputation through metatarsophalangeal  joint.   ANESTHESIA:  Popliteal block with sedation.   SURGEON:  Leonides Grills, M.D.   ASSISTANT:  None.   COMPLICATIONS:  None.   TOURNIQUET TIME:  None.   DISPOSITION:  Stable to the PAR.   INDICATIONS:  This is a 77 year old male who on MRI as well as clinically  has findings consistent with osteomyelitis of the middle and distal phalanx  of the left fourth toe.  After a long consultation and discussion the  patient has elected to undergo a left fourth toe amputation instead of IV  antibiotics at this point.  He was consented for the above procedure.  All  risks, which include infection, neurovascular injury, persistent infection  and more proximal amputation were all explained.  Questions were encouraged  and answered.   DESCRIPTION OF OPERATION:  The patient was brought to the operating room and  placed in the supine position.  After adequate popliteal block with sedation  was administered (the patient was already on previous antibiotics) the left  lower extremity was then prepped and draped in a sterile manner.  No  tourniquet was used.   A curvilinear incision was then made around the base of the left fourth toe.  Dissection was carried down to bone.  Soft tissue was then elevated  circumferentially to the MTP joint.  Capsulotomy was then performed.  The  toe was  removed.  Hemostasis was obtained.  The deep tissues were closed  with 3-0  Vicryl and skin was closed with 4-0 nylon.  A sterile dressing was applied.   The patient was stable to the PAR.       PB/MEDQ  D:  10/25/2003  T:  10/25/2003  Job:  [id number]

## 2010-06-07 NOTE — H&P (Signed)
[place]. [facility]  Patient:    [name], [name]                        MRN: Adm. Date:  04/16/99 Attending:  Payton Doughty, M.D.                         History and Physical  ADMISSION DIAGNOSIS:  Cervical spondylosis at C4-5 and C6-C7.  HISTORY OF PRESENT ILLNESS:  This is a now 77 year old right handed white gentleman who has had a C5-6 disc done for cervical myelopathy numerous years ago.  He has also had a carpal tunnel done and he has had decompression of his left radial nerve.  He has had increasing clumsiness in his hands.  I can not button and unbutton objects.  EMG and nerve conduction study demonstrated undetectable median nerve left ulnar nerve diminished amplitude and ulnar muscles at C7 and C7.  He has loss of thenar eminence.  Hoffmans is positive.  MRI shows significant decompression of the spinal cord at C4-5 and C6-C7 levels above and below his previous fusion and he is now admitted for anterior cervical diskectomy and fusion at 4-5, 5-6 and 6-7.  PAST MEDICAL HISTORY:  Remarkable for a lymphoma in 1979.  It has completely resolved.  He has hypertension.  He has had a left knee replacement. He has had a bone graft revision in his left hip. He has had a right knee replacement and anterior cervical diskectomy and fusion.  SOCIAL HISTORY:  He does not smoke and does not drink.  He is a self Clinical biochemist.  ALLERGIES:  None.  FAMILY HISTORY:  Mom is 40 and daddy is deceased of cardiac disease.  PHYSICAL EXAMINATION:  HEENT: Within normal limits.  NECK:  He has reasonable range of motion in his neck but positive Lhermittes sign.  CHEST:  Clear.  CARDIAC: Unremarkable with regular rate and rhythm.  ABDOMEN:  Nontender.  No hepatosplenomegaly.  EXTREMITIES:  Without clubbing or cyanosis.  GU:  Deferred.  PERIPHERAL PULSES:  Good.  NEUROLOGIC: He is awake, alert and oriented.  His cranial nerves are intact. Motor exam  demonstrates 5/5 strength throughout the upper extremity save for the grips which are bilaterally 4/5.  Lower extremities are full strength with knee jerks at 3.  No clonus and toes are downgoing.  CLINICAL IMPRESSION:  Cervical myelopathy with spondylitic change at C4-5 and C6-C7.  PLAN:  The plan is to incorporate 4-5 and 6-7 fusions into the existing 5-6 fusion. The risks and benefits of this have been discussed with him and he wishes to proceed. DD:  04/16/99 TD:  04/17/99 Job: [id number]

## 2010-06-07 NOTE — Discharge Summary (Signed)
NAMEROURKE, MCQUITTY               ACCOUNT NO.:  [id number]   MEDICAL RECORD NO.:  [id number]          PATIENT TYPE:  INP   LOCATION:  3018                         FACILITY:  [facility]   PHYSICIAN:  Payton Doughty, M.D.      DATE OF BIRTH:  October 23, 1928   DATE OF ADMISSION:  01/19/2004  DATE OF DISCHARGE:  01/23/2004                                 DISCHARGE SUMMARY   ADMITTING DIAGNOSIS:  Spondylosis of L3-L4, L4-L5, L5-S1.   DISCHARGE DIAGNOSIS:  Spondylolysis L3-L4, L5-S1, spondylosis L4-L5.   PROCEDURES:  L3-L4, L4-L5, L5-S1 fusion.   COMPLICATIONS:  None.   DISCHARGE STATUS:  Doing well.   SUMMARY:  This is a 77 year old gentleman whose history and physical is  recounted in the chart.  He has had severe lumbar spondylosis and  progressive increase in low back pain.  Decompression about six months ago  has helped transiently but he now has intractable back and mostly right  lower extremity pain.  He is admitted for decompression and fusion.  Details  of the history and physical were recounted in the chart.  He was admitted  after obtaining normal laboratory values and underwent lumbar fusion.  It  was found that he had spondylolysis at L3-L4 and L5-S1.  Postoperatively, he  has done well.  His incision is well healing.  He has remained afebrile  except for one fever spike which resolved spontaneously.  His strength is  full save for the hip flexor on the right side which is limited by his hip  prosthesis.  He did well with physical therapy.  Foley is out.  PCA is  stopped.  He is being discharged home on Percocet and ciprofloxacin.  His  followup will be with [facility] office in about a week for  sutures.       MWR/MEDQ  D:  01/23/2004  T:  01/23/2004  Job:  [id number]

## 2010-06-07 NOTE — Discharge Summary (Signed)
NAMETYRESSE, [name]               ACCOUNT NO.:  [id number]   MEDICAL RECORD NO.:  [id number]          PATIENT TYPE:  INP   LOCATION:  3031                         FACILITY:  [facility]   PHYSICIAN:  Coletta Memos, M.D.     DATE OF BIRTH:  06/27/1928   DATE OF ADMISSION:  10/21/2005  DATE OF DISCHARGE:  10/25/2005                                 DISCHARGE SUMMARY   ADMISSION DIAGNOSIS:  Spondylosis L1-2, L2-3.   DISCHARGE DIAGNOSIS:  Spondylosis L1-2, L2-3.   PROCEDURE:  L2-3 laminectomy, discectomy.  Posterior lumbar interbody fusion  with Ray threaded cages, segmental pedicle screw fixation T9-L3,  posterolateral arthrodesis T9-L3.   DISCHARGE STATUS:  Alive and well.  Destination home.   DISCHARGE MEDICATIONS:  Discharge medications include Percocet and Flexeril.   HISTORY:  Mr. Jantz is a gentleman who had had previous surgery and fusion  from L3-S1__________ done well with that fusion but he returned with  significant pain secondary to lumbar stenosis __________.  He underwent a  decompression with arthrodesis on October 2.  He did well postoperatively.  He will be discharged home when he is walking with the use of a walker  without any problems.  He is voiding without difficulty.  Wounds are clean  and dry, no signs of infection __________.   DICTATION STOPPED HERE           ______________________________  Coletta Memos, M.D.     KC/MEDQ  D:  10/25/2005  T:  10/25/2005  Job:  [id number]

## 2010-06-07 NOTE — Op Note (Signed)
NAMEKAMONI, GENTLES NO.:  [id number]   MEDICAL RECORD NO.:  [id number]          PATIENT TYPE:  INP   LOCATION:  2626                         FACILITY:  [facility]   PHYSICIAN:  Payton Doughty, M.D.      DATE OF BIRTH:  [birth year]   DATE OF PROCEDURE:  10/21/2005  DATE OF DISCHARGE:                                 OPERATIVE REPORT   PREOPERATIVE DIAGNOSES:  Spondylosis, L2-3, L1-2.   POSTOPERATIVE DIAGNOSES:  Spondylosis, L2-3, L1-2.   OPERATIVE PROCEDURE:  L2-3 laminectomy, diskectomy, posterior lumbar  interbody fusion with Ray threaded fusion cages, segmental pedicle-screw  fixation from T9 to L3, and posterolateral arthrodesis, T9 to L3.   SURGEON:  Payton Doughty, M.D.   DOCTOR ASSISTANT:  Stefani Dama, M.D.   NURSE ASSISTANT:  [place].   SERVICE:  Neurosurgery.   ANESTHESIA:  General endotracheal.   PREPARATION:  Prep was done with an alcohol wipe.   COMPLICATIONS:  None.   BODY OF TEXT:  A 77 year old gentleman with severe lumbar spondylosis at 2-3  and severe neurogenic claudication, taken to the operating room, smoothly  anesthetized, intubated, placed prone on the operating table.  Following  shave, prep and drape in the usual sterile fashion, the skin was infiltrated  with 1% lidocaine with 1:100,000 epinephrine.  The skin was incised from T9  down to the top of L3.  The laminae of T9, T10, T11, T12, L1, L2 and the  transverse processes of L3 were exposed bilaterally in a subperiosteal  plane.  Correctness of the level was confirmed by the presence of pedicle  screws known to be in L3.  The pars interarticularis and lamina of L2 were  removed bilaterally with a high-speed drill, as well as the superior facet  of L3.  This allowed placement of Ray threaded fusion cages.  This level was  extremely spondylytic, with severe compression of the nerve roots.  Following complete decompression and placement of the cages, pedicle screws  were then placed  using standard landmarks in L2, L1, T12, T11, T9, T11, T10  and T9.  Intraoperative x-ray showed good placement of screws.  The rod was  contoured to connect to them using the rod-to-rod connector devices in the  [employer] set.  Following contouring of the rods and attachment, the caps were  tightened.  Intraoperative x-ray showed good confirmation of the spine and  good placement of the pedicle screws.  The ligaments of the thoracic  vertebra were decorticated as well the transverse processes of the lumbar  vertebra, and BMP on the extender Infuse matrix was placed as a  posterolateral arthrodesis.  The Ray cages had previously been impacted with  the bone graft harvested from the facet joints.  Successive layers of 0  Vicryl, 2-0 Vicryl and 3-0 nylon were used to close.  Betadine and a Telfa  dressing were applied, and the patient returned to the recovery room in good  condition.           ______________________________  Payton Doughty, M.D.     MWR/MEDQ  D:  10/21/2005  T:  10/21/2005  Job:  [id number]

## 2010-06-07 NOTE — Consult Note (Signed)
NAMEION, GONNELLA               ACCOUNT NO.:  [id number]   MEDICAL RECORD NO.:  [id number]          PATIENT TYPE:  INP   LOCATION:  3010                         FACILITY:  [facility]   PHYSICIAN:  Leonides Grills, M.D.     DATE OF BIRTH:  Aug 17, 1928   DATE OF CONSULTATION:  10/25/2003  DATE OF DISCHARGE:                                   CONSULTATION   CHIEF COMPLAINT:  Left fourth toe pain and swelling.   HISTORY:  This is a 77 year old male who has had progressive swelling and  pain involving his left fourth toe for several days.  He was admitted to the  hospital over the weekend where he has been evaluated for osteomyelitis of  his left fourth toe.  He has also had Herpes flare up as well.  He has had a  low grade fever and has no history of diabetes or gout.   PHYSICAL EXAMINATION:  He has a severely swollen left fourth toe.  It is  exquisitely tender.  There is erythema throughout.  This stops at just about  the MTP joint.  He has palpable dorsalis pedes and posterior tibial pulse.  Slight swelling to the great toe, there is no erythema and range of motion  of the joint.  No ulceration.  Sensation intact to light touch.   MRI was reviewed and is consistent with osteomyelitis of the middle and  distal phalanges of the left fourth toe.   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   PLAN:  I explained to Mr. and Ms. Restivo at great length today their options  of conservative management with IV antibiotics versus amputation through the  MTP joint and they wished to proceed with amputation.  We will keep him  n.p.o.  We will proceed with this in the near future.  I explained to them  the risks of the procedure which include infection, more proximal  amputation, osteomyelitis, and wound break down, these were all explained  and questions encouraged and answered.  We will proceed in the near future.       PB/MEDQ  D:  10/25/2003  T:  10/25/2003  Job:   [id number]

## 2010-06-07 NOTE — Op Note (Signed)
[name], [name]               ACCOUNT NO.:  [id number]   MEDICAL RECORD NO.:  [id number]          PATIENT TYPE:  INP   LOCATION:  2899                         FACILITY:  [facility]   PHYSICIAN:  Payton Doughty, M.D.      DATE OF BIRTH:  [birth year]   DATE OF PROCEDURE:  01/19/2004  DATE OF DISCHARGE:                                 OPERATIVE REPORT   PREOPERATIVE DIAGNOSIS:  Spondylosis at L3-4, 4-5 and L5-S1.   POSTOPERATIVE DIAGNOSIS:  Spondylosis at L3-4, 4-5 and L5-S1.   PROCEDURE:  L3-4, L4-5 and L5-S1 laminectomy and facetectomy, L3-4 and L4-5  posterior lumbar interbody fusion with Ray cages, L3-4, L4-5 and L5-S1  segmental posterior pedicle screw instrumentation and L3 to S1  posterolateral arthrodesis.   SURGEON:  Payton Doughty, M.D.   ANESTHESIA:  General endotracheal anesthesia.   PREP:  Skin prepped with alcohol wipe.   COMPLICATIONS:  None.   NURSE ASSISTANT:  [place].   DOCTOR ASSISTANT:  Danae Orleans. Venetia Maxon, M.D.   BODY OF TEXT:  44 three-year-old gentleman with severe spondylosis that  prevents him from walking. He is taken to the operating room for the above  procedures.   DESCRIPTION OF PROCEDURE:  He was taken to the operating room, given general  anesthesia and placed prone on the operating table.  The back was shaved,  prepped and draped in the usual sterile fashion.  The skin was infiltrated  with 1% lidocaine with 1:400,000 epinephrine.  The skin was incised from the  middle of L2 to the middle of S1 and the remaining lamina, pars,  reticularis, transverse process of L3, L4, L5 and the sacral ala were  exposed bilaterally.  An intraoperative x-ray confirmed correctness of the  level.  The pars, reticularis, lamina and inferior facet of L3, L4 and L5  and the superior facets of L4, L5 and S1 were removed bilaterally.  L3 had  bilateral pars defects and significant compression particularly on the left  side of the L4 root as it traversed this area.  At  4-5 the pars were intact.  There was significant disk under the right L4 root.  L5 had bilateral pars  defects and on the left side there was a conjoined root at L5.  Following  complete decompression of all the nerve roots, the Ray cage fusion cages  were placed at 3-4 and 4-5.  These were 12 x 20 mm cages.  It was not  feasible to place cages at 5-1 because of the conjoined root. The pedicle  screws were then placed at L3, L4, L5 and S1.  The left sided S1 screw was  repositioned with a larger screw but adequate purchase was obtained. The  screws were connected by a rod and locking devices placed and locked.  The  transverse process and sacral ala were decorticated with a high speed drill  and DBX and bone graft were picked together as an intertransverse bone  graft. The cages had previously been packed with bone graft harvested from  the facet joints.  An intraoperative x-ray showed good placement  of the Ray  cages, pedicle screws and rods.  The fascia was reapproximated with 0 Vicryl  in an interrupted fashion. The  subcutaneous tissues were reapproximated with 0 Vicryl in an interrupted  fashion.  The skin was closed with 3-0 nylon in a running locking fashion.  Betadine, Telfa dressing and OpSite were used.  The patient returned to the  recovery room in good condition.       MWR/MEDQ  D:  01/19/2004  T:  01/19/2004  Job:  [id number]

## 2010-06-07 NOTE — H&P (Signed)
[name], [name]               ACCOUNT NO.:  [id number]   MEDICAL RECORD NO.:  [id number]          PATIENT TYPE:  INP   LOCATION:  3010                         FACILITY:  [facility]   PHYSICIAN:  Payton Doughty, M.D.      DATE OF BIRTH:  October 21, 1928   DATE OF ADMISSION:  10/22/2003  DATE OF DISCHARGE:                                HISTORY & PHYSICAL   ADMISSION DIAGNOSES:  1.  Left foot cellulitis.  2.  Left conjunctivitis.  3.  Lumbar spondylosis.   HISTORY:  Very nice 77 year old right-handed white gentleman with history of  cervical myelopathy, peripheral nerve entrapment, lumbar disk disease,  difficulty ambulating secondary to stenosis.  He has also had increase in  left eye pain, left foot swelling and induration.  He has been treated with  clindamycin orally at home without improvement of the foot.  It was planned  for operation Wednesday until the foot problem arose.  Because his foot is  worsening, I contacted his wife today. She said it was worse, and I had him  come in for treatment of his foot and his eye.   PAST MEDICAL HISTORY:  Remarkable for hypertension.  He is on Ziac.   PAST SURGICAL HISTORY:  1.  Cervical and lumbar spine.  2.  Left hip replacement with subsequent staph infection about 20 years ago.      He has increased renal function tests secondary to the gentamycin is was      on for that.   SOCIAL HISTORY:  He does not smoke, does not drink.   FAMILY HISTORY:  Unknown.   ALLERGIES:  None.   MEDICATIONS:  Ziac, clindamycin.   REVIEW OF SYSTEMS:  Remarkable for diarrhea since starting clindamycin.   PHYSICAL EXAMINATION:  GENERAL:  He is awake with pain in his foot, low  back, and left eye.  The left eye is indurated with excessive tearing, and  there is no foreign body visible.  NECK:  Supple with no lymphadenopathy.  CHEST:  Clear.  CARDIAC:  Regular rate and rhythm.  ABDOMEN:  Nontender with no hepatosplenomegaly.  EXTREMITIES: Scarring in the  right hip.  Left foot has some swelling of the  fourth toe and into the third and fifth toes with swelling into the foot.  He has good pulses.  NEUROLOGIC:  He is awake, alert, and oriented.  His cranial nerves are  intact.  Motor exam shows 5/5 strength throughout the lower extremities  except on the right where he has 4/5 weakness in hip flexors and knee  extension on the right.   IMPRESSION:  1.  Cellulitis of the left foot.  Will obtain blood cultures.  Start      Primaxin.  Get an MRI and X-ray.  Orthopedics and infectious disease      will see him.  2.  The left eye has conjunctivitis.  There is no foreign body; however, the      differential will be foreign body versus corneal abrasion.      Ophthalmologist will visit with him.  Will place erythromycin ointment  in it and a patch.  3.  Lumbar spondylosis.  He needs to have his spine decompressed and      stabilized, but that cannot occur until the infection in his foot is      completely eradicated.   Appropriate consults have been contacted.       MWR/MEDQ  D:  10/22/2003  T:  10/22/2003  Job:  [id number]

## 2010-06-07 NOTE — Discharge Summary (Signed)
NAME:  [name], [name]                         ACCOUNT NO.:  [id number]   MEDICAL RECORD NO.:  [id number]                   PATIENT TYPE:  INP   LOCATION:  3014                                 FACILITY:  [facility]   PHYSICIAN:  Payton Doughty, M.D.                   DATE OF BIRTH:  [birth year]   DATE OF ADMISSION:  07/04/2003  DATE OF DISCHARGE:  07/07/2003                                 DISCHARGE SUMMARY   ADMISSION DIAGNOSIS:  Spondylosis on the right side at L4-5, L5-S1 and at  T10-11 on the right.   DISCHARGE DIAGNOSIS:  Spondylosis on the right side at L4-5, L5-S1 and at  T10-11 on the right.   OPERATIVE PROCEDURE:  Right L4-5, L5-S1 laminectomy, diskectomy at L4-5, and  right T10-11 facetectomy and diskectomy.   This is a very nice 77 year old right-handed white gentleman whose history  and physical is recounted in the chart.  He has had increasing right lower  extremity dysfunction after a fall off a tractor and an MR that shows  congestion of the right side at 4-5 and 5-1 as well as a right-sided T10-11  disk.  General exam was intact.   MEDICATIONS:  None.   ALLERGIES:  None.   PHYSICAL EXAMINATION:  GENERAL:  Exam was intact.  NEUROLOGIC:  Exam showed a right L5 radiculopathy.   HOSPITAL COURSE:  He was admitted after ascertainment of normal laboratory  values and underwent a right-sided L4-5, L5-S1 decompression and a 4-5  diskectomy on the right as well as a right T10-11 facetectomy/diskectomy on  the right.  Postoperatively, his leg pain is remarkably improved.  His  dorsiflexion weakness is virtually resolved.  He spent two days with the  physical therapist getting up and about.  He uses a brace and has good  strength.  His sensation is intact.  His incisions are dry and well healing.  He is eating and voiding normally.  He is being discharged home to the care  of his family.  His followup will in the [facility]  office in about a week for suture  removal.                                                Payton Doughty, M.D.    MWR/MEDQ  D:  07/07/2003  T:  07/08/2003  Job:  [id number]

## 2010-06-07 NOTE — Discharge Summary (Signed)
[place]. [facility]  Patient:    [name], [name]                      MRN: [id number] Adm. Date:  [id number] Disc. Date: [id number] Attending:  Emeterio Reeve                           Discharge Summary  ADMISSION DIAGNOSIS:  Cervical spondylitic myelopathy at C4-5, C5-6 and C6-7.  DISCHARGE DIAGNOSIS:  Cervical spondylitic myelopathy at C4-5, C5-6 and C6-7.  OPERATION:  C4-5, C5-6 and C6-7 anterior cervical diskectomy and fusion with an A line plate.  COMPLICATIONS:  None.  HISTORY OF PRESENT ILLNESS:  This is a 77 year old, right handed white gentleman whose history and physical is recounted on the chart.  He has had cervical spondylitic myelopathy which is increased and he is now admitted for an anterior cervical diskectomy and fusion at 4-5, 5-6 and 6-7.  He had a prior diskectomy at 5-6 and appeared to have a pseudoarthrosis there.  PAST MEDICAL HISTORY:  Remarkable for a lymphoma in the past.  He has also had a hip replacement.  Reconstruction of a hip replacement.  An anterior cervical diskectomy.  Carpal tunnel. TURP.  Knee replacement and radial and ulnar tunnel releases on the left arm.  MEDICATIONS:  He is on Diovan for blood pressure.  PHYSICAL EXAMINATION:  General exam is intact.  Neurologic exam shows diminished grip and interosseous strength as well as positive Hoffmans.  HOSPITAL COURSE:  He was admitted after ascertainment of normal laboratory values and underwent anterior cervical diskectomy and fusion at C4-5, C5-6 and C6-C7.  Postoperatively, he has done well with good return of grip strength. He is up walking about. His incision is dry and well healing.  He is eating and voiding normally.  He is being discharged home on Vicodin for pain. His follow up will be in the [facility] office in about ten days with a lateral C-spine x-ray. DD:  04/18/99 TD:  04/19/99 Job: [id number]

## 2010-06-07 NOTE — H&P (Signed)
NAME:  [name], HARDMAN                         ACCOUNT NO.:  [id number]   MEDICAL RECORD NO.:  [id number]                   PATIENT TYPE:  INP   LOCATION:  2866                                 FACILITY:  [facility]   PHYSICIAN:  Payton Doughty, M.D.                   DATE OF BIRTH:  05-16-1928   DATE OF ADMISSION:  07/04/2003  DATE OF DISCHARGE:                                HISTORY & PHYSICAL   ADMITTING DIAGNOSES:  1. Spondylosis on the right side at L4-L5, L5-S1.  2. Herniated disk at T12-L1, also on the right.   HISTORY:  This is a very nice now 77 year old, right handed, white  gentleman, who has had anterior cervical decompressions and fusions done  several times.  He has had carpal tunnel as well as a Guyon's canal done.  A  few weeks ago he took a fall off a tractor, and he has had increasing  dysfunction in his right lower extremity.  He has an MR that shows disk and  compression at L4-L5 and L5-S1 on the right.  He is now admitted for  decompression.  He also has a T12-L1 disk on the right side that we also be  decompressed.   MEDICAL HISTORY:  1. Lymphoma in 1979 with complete resolution.  2. Hypertension.  3. He has no allergies.   He does not take any medications.   He has had a left knee replacement and a bone graft revision in his left  hip.  He has had a right knee replacement.   SOCIAL HISTORY:  He does not smoke and does not drink.  He is a Visual merchandiser.   ALLERGIES:  None.   FAMILY HISTORY:  Mom died at 71, and father died of cardiac disease.   REVIEW OF SYSTEMS:  Remarkable for back and right lower extremity pain.   PHYSICAL EXAMINATION:  HEENT:  Within normal limits.  NECK:  He has good range of motion of his neck.  CHEST:  Clear.  CARDIAC:  Regular rate and rhythm.  ABDOMEN:  Nontender.  No hepatosplenomegaly.  EXTREMITIES:  Without clubbing or cyanosis.  Peripheral pulses are good.  GU:  Deferred.  NEUROLOGIC:  He is awake, alert, and oriented.  His cranial  nerves are  intact.  Motor exam shows 5/5 strength in the upper extremities.  In the  lower extremities, he has dorsiflex weakness on the right side 3/5.  Knee  jerk is absent.  Right ankle jerk is present.  Straight leg raise is  positive on the right.   MR shows a disk at 4-5 and 5-1 on the right side, disruption of facette  joints is chronic.  He also has a T12-L1 large foraminal disk at compression  out in the neuroforamen but not the cord.   CLINICAL IMPRESSION:  Right lumbar radiculopathy related to spondylosis and  disk at L4-L5 and L5-S1.  PLAN:  Laminotomy, foraminotomy at L4-L5 and L5-S1 and T12-L1 diskectomy on  the right side.  The risks and benefits of this approach have been discussed  with him, and he wishes to proceed.                                                Payton Doughty, M.D.    MWR/MEDQ  D:  07/04/2003  T:  07/04/2003  Job:  [id number]

## 2010-06-07 NOTE — H&P (Signed)
[name], [name]               ACCOUNT NO.:  [id number]   MEDICAL RECORD NO.:  [id number]          PATIENT TYPE:  INP   LOCATION:  1516                         FACILITY:  [facility]   PHYSICIAN:  Ollen Gross, M.D.    DATE OF BIRTH:  [birth year]   DATE OF ADMISSION:  11/13/2004  DATE OF DISCHARGE:                                HISTORY & PHYSICAL   DATE OF OFFICE VISIT:  November 01, 2004.   CHIEF COMPLAINT:  Right hip pain with loosening in Poly wear.   HISTORY OF PRESENT ILLNESS:  The patient is a 77 year old male who has been  seen by Dr. Lequita Halt for ongoing right hip pain and discomfort. He has  undergone a right total hip back in 1983 at [name] and underwent  reconstruction revision of the hip back in 1994 which was his last surgery  on his right hip. Since that time he has developed some pain with the right  hip. He was seen and evaluated by Dr. Lequita Halt and found to have Polyethelene  wear with some osteolysis. It is felt at this point due to the significant  Poly wear and also the osteolysis which is noted, that he has reached a  point where he needs to have something surgically done to include  Polyethylene exchange and thermal head exchange with possible revision if  needed of the cuff and stem. Risks and benefits of this procedure have been  discussed with the patient, he elects to proceed with surgery.   ALLERGIES:  No known drug allergies.   CURRENT MEDICATIONS:  Ziac 5 mg daily.   PAST MEDICAL HISTORY:  Hypertension   PAST SURGICAL HISTORY:  Biopsy of the right temple x2. Removal of a lymph  node in 1979. He is has had cosmetic surgery of the right temple twice.  Right total hip replacement in 1983 with subsequent revision in 1994. Carpal  tunnel release on the left in 1997. A TURP in 1998. Left total knee  replacement in 1998. Bunion surgery in 1999. Left hand surgery in 2000. Left  arm surgery in 2000. Three level cervical fusion in 2001. Right ankle  surgery in 2002. Thoracic and lumbar surgery in 2005. Amputation of left  fourth toe in 2005. Second lumbar surgery with fusion with screws and ray  cages in December, 2005.   SOCIAL HISTORY:  Married. Retired. Nonsmoker. No alcohol. Has four children.  Wife will be assisting with care after surgery.   FAMILY HISTORY:  Mother deceased at age 49 with diabetes and MI. Father  deceased at age 90 with heart failure and emphysema. He has two sisters both  with diabetes.   REVIEW OF SYSTEMS:  GENERAL:  No fevers, chills, or night sweats. NEURO:  No  seizures, syncope or paralysis. RESPIRATORY:  He has had some recent  productive cough with a recent work up. No significant etiology is found  (possible allergies). CARDIOVASCULAR:  No chest pain, angina, orthopnea. GI:  No nausea, vomiting, diarrhea or constipation. GU:  No dysuria, hematuria,  discharge. MUSCULOSKELETAL:  Right hip found in the history of present  illness.   PHYSICAL EXAMINATION:  VITAL SIGNS:  Pulse 56, respirations 14, blood  pressure 148/68.  GENERAL:  A 77 year old well-developed, well-nourished white male, in no  acute distress. He is alert and oriented, cooperative. He is accompanied by  his wife.  HEENT:  Normocephalic, atraumatic. Pupils are equal, round and reactive.  Oropharynx clear.  NECK:  Supple.  CHEST:  Clear.  HEART:  Irregular rhythm. No murmurs are appreciated. S1, S2 noted.  ABDOMEN:  Soft, nontender, bowel sounds present.  BREASTS/GENITALIA:  Not done per present illness.  EXTREMITIES:  Right hip shows pain free range of motion. Hip flexion is  about 90 degrees into rotation and 5 external rotation with abduction of  about 20.   IMPRESSION:  1.  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  2.  Hypertension.   PLAN:  The patient is admitted to [facility] to undergo a right  hip revision with a liner exchange, femoral head exchange, with possible   revision of components.      Alexzandrew L. Julien Girt, P.A.      Ollen Gross, M.D.  Electronically Signed    ALP/MEDQ  D:  11/13/2004  T:  11/13/2004  Job:  [id number]   cc:   Kingsley Callander. Ouida Sills, MD  Fax: [phone]   [name], M.D.  Fax: [phone]

## 2010-06-07 NOTE — Op Note (Signed)
[name], [name]               ACCOUNT NO.:  [id number]   MEDICAL RECORD NO.:  [id number]          PATIENT TYPE:  INP   LOCATION:  0006                         FACILITY:  [facility]   PHYSICIAN:  Ollen Gross, M.D.    DATE OF BIRTH:  Jun 06, 1928   DATE OF PROCEDURE:  11/13/2004  DATE OF DISCHARGE:                                 OPERATIVE REPORT   PREOPERATIVE DIAGNOSIS:  Polyethylene failure right total hip.   POSTOPERATIVE DIAGNOSIS:  Polyethylene failure right total hip.   PROCEDURE:  Right hip acetabular liner revision.   SURGEON:  Ollen Gross, M.D.   ASSISTANT:  Avel Peace, P.A.-C.   ANESTHESIA:  General.   ESTIMATED BLOOD LOSS:  300.   DRAINS:  Hemovac x1.   COMPLICATIONS:  None.   CONDITION:  Stable to recovery.   BRIEF CLINICAL NOTE:  Mr. Mecham is a 77 year old male who had a right hip  revision done over 10 years ago at [facility]. He has had significant  polyethylene wear and osteolysis. He has developed some groin pain. It is  equivocal whether or not the acetabular component is a loose. The femoral  component is a well-fixed AML long stem. He presents now for acetabular  liner versus total hip revision.   PROCEDURE IN DETAIL:  After successful administration of general anesthetic,  he is placed in the left lateral decubitus position with his right side up.  The right hip is isolated from the perineum with plastic drapes and prepped  and draped in the usual sterile fashion. He had a long far posterior  incision. We used the distal aspect of my current incision utilized his  previous incision and then proximally we used more of a posterolateral  incision. The skin was cut with a 10 blade through the subcutaneous tissue  to the level of the fascia lata which was incised in line with the skin  incision. A significant amount of scarring present. We then palpated the  sciatic nerve and protected it. The pseudocapsule excised off the posterior  femur to  get into the joint. No fluid was encountered. There was a lot of  osteolytic debris present. The membrane surrounding the acetabular component  is removed. The hip is then dislocated and the femoral head removed. The  femoral component is very well fixed. It is stable to torsion on axial  testing. There is some lysis present proximally but again no loosening. We  retracted the femur anteriorly to gain acetabular exposure.   The acetabular component has gross wear. There was a large groove  posterosuperior where the head and neck had worn. There is some osteolytic  debris surrounding this. This was all removed. The locking ring was also  broke and we removed the ring and the acetabular liner easily. The  acetabular shell is in neutral version. The shell appears well fixed. I  threaded the impactor handle into the shell and while attempting to move the  shell, the shell and pelvis moved as a solitary unit consistent with it  being well grown. I even removed one of the screws from the  acetabular shell  and still everything was very solid. It was decided to keep the shell intact  since there was minimal thickness of bone surrounding it and there would be  a lot of pelvic disruption in order to attempt to remove this. Since it was  well fixed, it is left intact. I debrided out any osteolytic membrane.  Through the screw holes, we also debrided out some lytic membrane. I  thoroughly irrigated it. The defect was really not large enough to pack any  graft in. We then placed a new locking ring for the 62 mm inner diameter  shell. It was a 72 mm shell overall. The trial 62 liner fits well. We then  placed the permanent 62 mm x 36, 10 degree plus 4 liner. The elevated rim is  at the 11 o'clock position. I then trialed the heads. With a 36 plus zero,  there was too much laxity. We went up to a 36 plus 11 which corrected the  soft tissue tension. His stability was full extension, full external   rotation 70 degrees flexion, 40 degrees adduction and about 40 degrees  internal rotation which was better then the rotation he had preop. At 90  degrees flexion, he was still stable. The trial was removed and the  permanent 36 plus 11 head is placed. The wound is then copiously irrigated  with saline solution and the posterior tissues reattached to the femur  through drill holes with Ethibond suture. The fascia lata was then closed  over a Hemovac drain with interrupted #1 Vicryl, subcu closed with #1 and #2-  0 Vicryl and skin with staples. The drain is hooked to suction, incision  cleaned and dried and a bulky sterile dressing applied. He was placed into a  knee immobilizer, awakened and transported to recovery in stable condition.      Ollen Gross, M.D.  Electronically Signed     FA/MEDQ  D:  11/13/2004  T:  11/13/2004  Job:  [id number]

## 2010-06-07 NOTE — Discharge Summary (Signed)
[name], [name]               ACCOUNT NO.:  [id number]   MEDICAL RECORD NO.:  [id number]          PATIENT TYPE:  INP   LOCATION:  1516                         FACILITY:  [facility]   PHYSICIAN:  Ollen Gross, M.D.    DATE OF BIRTH:  1928-12-07   DATE OF ADMISSION:  11/13/2004  DATE OF DISCHARGE:  11/16/2004                                 DISCHARGE SUMMARY   ADMISSION DIAGNOSES:  1.  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  2.  Hypertension.   DISCHARGE DIAGNOSES:  1.  Polyethylene failure, right hip, status post right hip acetabular liner      revision.  2.  Hypertension.   PROCEDURE:  On November 13, 2004, right hip acetabular liner revision.   SURGEON:  Ollen Gross, M.D.   ASSISTANT:  Alexzandrew L. Julien Girt, P.A.-C.   ANESTHESIA:  General.   BRIEF HISTORY:  Mr. Harold is a 77 year old male with a right hip revision  done over 10 years ago at [facility].  He has developed significant  polyethylene wear with osteolysis and some groin pain.  It is equivocal  whether or not the acetabular component is loose.  The femoral component  feels well-fixed.  Now presents for liner exchange versus revision.   LABORATORY DATA:  Preop CBC:  Hemoglobin 14.6, hematocrit 42.9, white cell  count 5.6.  Differential normal.  Postop hemoglobin 12.  Last noted H&H of  12.7 and 36.4.  PT/PTT preop 14 and 36, respectively.  Serial pro times  followed:  Last noted PT/INR 25.8 and 20.3.  Chem panel on admission:  Elevated BUN 34, elevated creatinine 2.4.  The remaining chem panel within  normal limits with the exception of an elevated ALP of 127.  Serial BMETs  were followed.  BUN improved to 27.  Creatinine dropped a little to 2.3.  The remaining electrolytes remained within normal limits.  Urinalysis  negative.  Blood group type O+.   Right hip films dated November 11, 2004:  Right hip replacement with evidence  of paraprosthetic cyst formation  and severe displacement of the femoral head  relative to the acetabulum.   Portable pelvis on November 13, 2004:  Status post revision, right total hip,  without immediate complicating features.   Preop Cardiolite dated October 10, 2004:  Normal Adenosine Cardiolite.  No  evidence of ischemia.  There is normal LV function.   Patient had a chest x-ray, preop, September 30, 2004:  No acute disease.   HOSPITAL COURSE:  Admitted to [facility].  Tolerated the procedure  well and was later transferred to the recovery room and then to the  orthopedic floor.  Started on PCA and p.o. analgesics.  Did very well  through the night.  Not much pain but just did not sleep much.  Was seen on  rounds from day #1, doing well.  Due to it just a liner exchange, he was  able to be weightbearing as tolerated.  Fluids were reduced.  He did have  some preoperative renal insufficiency; however, his BUN remained stable  along with his creatinine postop.  He started getting up with physical  therapy.  Did well with his physical therapy.  Was up ambulating  approximately 60 feet by day #2 and then 100 feet by day #3.  Dressing was  changed on day #2.  His BUN and creatinine remained stable.  He did so well  with PT that by day #3, he was up moving around and was ready to go home.   DISCHARGE PLAN:  1.  Patient was discharged home on October 28 , 2006.  2.  Discharge diagnoses:  Please see above.  3.  Discharge meds:  Coumadin, Percocet, Robaxin.  4.  Diet:  Resume previous home diet.  5.  Activity:  He is to weight bear as tolerated.  Gait training ambulation,      ADLs.  Hip precautions.  Home health PT/OT and home health nursing.  6.  Follow up two weeks from surgery.   DISPOSITION:  Home.   CONDITION ON DISCHARGE:  Improved.      Alexzandrew L. Julien Girt, P.A.      Ollen Gross, M.D.  Electronically Signed    ALP/MEDQ  D:  01/02/2005  T:  01/03/2005  Job:  [id number]   cc:   Kingsley Callander.  Ouida Sills, MD  Fax: [phone]   [name], M.D.  Fax: [phone]

## 2010-06-07 NOTE — H&P (Signed)
NAMEELADIO, [name] NO.:  [id number]   MEDICAL RECORD NO.:  [id number]          PATIENT TYPE:  INP   LOCATION:  2626                         FACILITY:  [facility]   PHYSICIAN:  Payton Doughty, M.D.      DATE OF BIRTH:  1928/10/20   DATE OF ADMISSION:  10/21/2005  DATE OF DISCHARGE:                                HISTORY & PHYSICAL   ADMISSION DIAGNOSIS:  Spondylosis L1-2 and L2-3.   HISTORY OF PRESENT ILLNESS:  This is a 77 year old gentleman who has had  fuse from L3 to S1 and done reasonably well.  He has had some increasing  difficulty with gait and posture.  Fusion looks good at L2-3 and 1-2.  He  developed spondylolisthesis, subsequent MR showed significant stenosis at  these levels and he is here now for decompression and fusion at 2-3 and  extension of his fusion up to T9.   PAST MEDICAL HISTORY:  Remarkable for hypertension for which he is on Ziac.  He also has a bit of chronic renal insufficiency related to gentamicin used  to treat a staph infection 30 years ago.  He has had a hip replacement and  bone graft revision of the left hip.  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   ALLERGIES:  NONE.   SOCIAL HISTORY:  He does not smoke or drink. He is a [employer].   FAMILY HISTORY:  Mother died at 7.  Father died of cardiac disease at a  younger age.   REVIEW OF SYSTEMS:  Remarkable for back pain and gait difficulties.   PHYSICAL EXAMINATION:  HEENT:  Within normal limits.  NECK:  Supple with no lymphadenopathy.  CHEST:  Clear.  HEART:  Regular rate and rhythm.  ABDOMEN:  Nontender, no hepatosplenomegaly.  EXTREMITIES:  Without clubbing or cyanosis.  Peripheral pulses are good.  GENITOURINARY:  Deferred.  NEUROLOGIC:  He is awake, alert and oriented.  His cranial nerves are  intact.  Motor exam shows 5/5 strength throughout the upper and lower  extremities when seated, when standing and then when reseated quickly.  He  gets  about barely 4/5 in dorsi and plantar flexors and hip flexors.  He has  loss of proprioception.   Reflexes are absent in the lower extremities.   MR results have been reviewed above.   CLINICAL IMPRESSION:  Lumbar spondylosis with spondylolisthesis and severe  neurogenic claudication.   PLAN:  For laminectomy, diskectomy, posterior lumbar interbody fusion at L2-  3 and then extension of fusion from L3 up through T9.  The risks and  benefits of this approach have been discussed and he wishes to proceed.           ______________________________  Payton Doughty, M.D.     MWR/MEDQ  D:  10/21/2005  T:  10/22/2005  Job:  [id number]

## 2010-06-07 NOTE — Op Note (Signed)
NAME:  [name], [name]                         ACCOUNT NO.:  [id number]   MEDICAL RECORD NO.:  [id number]                   PATIENT TYPE:  INP   LOCATION:  3014                                 FACILITY:  [facility]   PHYSICIAN:  Payton Doughty, M.D.                   DATE OF BIRTH:  03-04-1928   DATE OF PROCEDURE:  07/04/2003  DATE OF DISCHARGE:                                 OPERATIVE REPORT   PREOPERATIVE DIAGNOSIS:  Herniated disk, right L4-5, spondylosis, right L5-  S1 and right T12 and T11 herniated disk.   POSTOPERATIVE DIAGNOSIS:  Herniated disk, right L4-5, spondylosis, right L5-  S1 and right T12 and T11 herniated disk.   OPERATION PERFORMED:  Right L4-5 and L5-S1 laminotomy and foraminotomy,  right L4-5 diskectomy, right T10-T11 facetectomy diskectomy.   SURGEON:  Payton Doughty, M.D.   ANESTHESIA:  General endotracheal.   PREP:  Sterile Betadine prep and scrub with alcohol wipe.   NURSE ASSISTANT:  [place].   COMPLICATIONS:  None.   INDICATIONS FOR PROCEDURE:  The patient is a 77 year old gentleman with  right leg dysfunction related to disk after a fall.   DESCRIPTION OF PROCEDURE:  The patient was taken to the operating room,  smoothly anesthetized, intubated, and placed prone on the operating table.  Following shave, prep and drape in the usual sterile fashion, the skin was  infiltrated with 1% lidocaine 1:400,000 epinephrine.  Skin was incised from  mid-L3 to mid-S1 and the lamina of L4, L5 and S1 were exposed on the right  side in the subperiosteal plane.  Intraoperative x-ray confirmed correctness  of level.  Having confirmed correctness of level, laminotomy and  foraminotomy were performed on the right side at L4-5 and L5-S1 to the top  and bottom of ligamentum flavum respectively.  The ligamentum flavum was  removed.  This allowed decompression of the 5 and 1 roots on the right side.  There was found to be a fair amount of hypertrophy of the ligamentum flavum  as well as some rather nondescript tissue associated with the right 4-5  disk.  There was also a herniated disk underneath the right 5 root.  The  annular fibers were divided and disk material removed to allow decompression  of the root.  The 5 and 1 roots were explored on the right side and found to  be free.  The wound was irrigated.  The laminotomy defects filled with Depo-  Medrol soaked fat.  The fascia was closed with 0 Vicryl in interrupted  fashion.  Subcutaneous tissue was reapproximated with 0 Vicryl in  interrupted fashion, subcuticular tissue was reapproximated with 3-0 Vicryl  in interrupted fashion and the skin was closed with 3-0 nylon in running  locked fashion.  Attention was then turned to the T11-T12 interspace where  incision was made from the bottom of T10 to the bottom of T12 and  the T10  and T11 laminae exposed on the right side.  Intraoperative x-ray confirmed  correctness of the level.  Working laterally through the facet joint, the  inferior facet of 11 and the superior facet of 12 were removed to allow  lateral access to the foramina and disk space. Having found the foramina and  the nerve root, the disk was immediately obvious underneath.  It was removed  with the pituitary.  At no time was the spinal cord touched.  The wound was  irrigated and hemostasis assured.  The facetectomy defect filled with Depo-  Medrol soaked fat.  The fascia was reapproximated with 0 Vicryl in  interrupted fashion.  Subcutaneous tissue reapproximated with 0 Vicryl in  interrupted fashion.  Subcuticular tissue reapproximated with 3-0 Vicryl in  interrupted fashion.  Skin was closed with 3-0 nylon in running locked  fashion.  Betadine Telfa dressing applied and made occlusive with OpSite.  The patient was then transferred to the recovery room in good condition.                                               Payton Doughty, M.D.    MWR/MEDQ  D:  07/04/2003  T:  07/05/2003  Job:  [id number]

## 2010-06-07 NOTE — Op Note (Signed)
[place]. [facility]  Patient:    TOWNSEND, CUDWORTH                      MRN: [id number] Proc. Date: 04/16/99 Adm. 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  OPERATION: Anterior cervical diskectomy and fusion at C4-5, C5-6, and C6-7 with  A-line plate.  SURGEON:   Payton Doughty, M.D.  SERVICE:  Neurosurgery.  ANESTHESIA:  General endotracheal anesthesia.  PREP:    In sterile manner and scrubbed with alcohol wipe.  COMPLICATIONS:  None.  INDICATIONS:  A 77 year old right-handed white gentleman with cervical myelopathy.  DESCRIPTION OF PROCEDURE:  He was taken to the operating suite, intubated, and placed supine on the operating table.  Following shave, prep, and drape in the usual sterile fashion, skin was incised paralleling the sternocleidomastoid muscle, starting a fingerbreadth above the clavicle and extending to two fingerbreadths  above the level of the carotid tubercle.  The platysma was identified, elevated, divided, and undermined.  The sternocleidomastoid was identified.  Medial dissection through the old scar was dissected.  The carotid artery was identified and retracted laterally.  The trachea and the esophagus were retracted laterally to the right, exposing the bones of the anterior cervical spine.  The scar in the prevertebral space was dissected, and intraoperative x-ray obtained with marker  in place confirmed correctness of the level.  The longus colli were taken down bilaterally and the ______ retractor placed.  Diskectomy was then carried ut at C4-5 and at C6-C7.  At C5-C6, the pseudoarthrosis was identified.  There was  some osseous union that was poor quality.  This was removed.  The anterior epidural space was dissected at  all three levels, and complete decompression achieved using undercutting with the Kerrison instrumentation.  Then 7 mm bone grafts were fashioned from patellar autograft and tapped into place at each level.  The 66 m A-line plate was then placed using 12 mm screws, two in C4, one in C5, one in C6, and two in C7.  Locking screws were placed.  Intraoperative x-ray obtained showed good placement of bone graft, plate, and screws.  The wound was irrigated, hemostasis assured.  The platysma was reapproximated with 3-0 Vicryl in interrupted fashion, subcutaneous tissue was reapproximated with 3-0 Vicryl in interrupted fashion.  The skin was closed with 4-0 Vicryl in running subcuticular fashion.  Benzoin and Steri-Strips were placed, meclizine, Telfa, and Op-Site.  The patient returned to the recovery room in good condition after being placed in Aspen collar.DD:  04/16/99 TD:  04/17/99 Job: [id number]

## 2010-06-07 NOTE — H&P (Signed)
NAMECARLO, [name]               ACCOUNT NO.:  [id number]   MEDICAL RECORD NO.:  [id number]          PATIENT TYPE:  INP   LOCATION:  NA                           FACILITY:  [facility]   PHYSICIAN:  Payton Doughty, M.D.      DATE OF BIRTH:  02-05-1928   DATE OF ADMISSION:  01/19/2004  DATE OF DISCHARGE:                                HISTORY & PHYSICAL   ADMITTING DIAGNOSIS:  Spondylosis at L3-4, L4-5, L5-S1.   This is a very nice 77 year old, right handed, white gentleman who has had a  history of cervical myelopathy, operated on, done well; peripheral nerve  entrapment, done well; lumbar disc disease with a discectomy and  decompression at L4-5 and L5-S1 on the right last March.  He has had a  marked increase in his low back pain and pain down in his legs.  MR shows  continued degenerative changes at L3-4, L4-5 and L5-S1.  He is admitted now  for decompression and fusion at those levels.  He has recently had foot  cellulitis with amputation of a toe and conjunctivitis.   PAST MEDICAL HISTORY:  Remarkable for hypertension - He is on Ziac for that.   PAST SURGICAL HISTORY:  1.  Cervical and lumbar spines as noted above.  2.  Peripheral nerve entrapment.  3.  He has also had a left hip replacement, Staph infection 20 years ago.  4.  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   SOCIAL HISTORY:  He does not smoke, does not drink.   FAMILY HISTORY:  Not given.   ALLERGIES:  None.   MEDICATIONS:  Ziac.   REVIEW OF SYSTEMS:  Remarkable for his back pain and hip pain.   PHYSICAL EXAMINATION:  HEENT:  Within normal limits.  He has reasonable  range of motion in his neck.  CHEST:  Clear.  CARDIAC:  Regular rate and rhythm.  ABDOMEN:  Nontender with no hepatosplenomegaly.  EXTREMITIES:  Without clubbing or cyanosis, although he is missing a fourth  toe on the left foot.  Peripheral pulses are good.  GU:  Deferred.  NEUROLOGIC:  He is awake, alert and oriented.  His cranial  nerves are  intact.  Motor exam shows 5/5 strength throughout the lower extremities  except on the right where there is 4/5 weakness hip flexors, knee extensors,  dorsiflexors.  Reflexes are absent at the knees and ankles.  Straight leg  raise is positive and he has a lot of back pain with motion.   Plain films and MR show significant lumbar spondylytic disease with  compressive pathology at 3-4 and 4-5.  L5-S1 is virtually ankylosed with  lateral recess narrowing.   CLINICAL IMPRESSION:  1.  Lumbar spondylosis.  2.  Neurogenic claudication.  3.  L4 and L5 radiculopathies.   PLAN:  Lumbar laminectomy, discectomy, posterolateral lumbar interbody  fusion at L3-4 and L4-5, attempted at L5-S1; if it is ankylosed it will be  simply decompressed and then segmental fusion and posterolateral arthrodesis  from L3-S1.  The risks and benefits of this approach have been discussed  with him and he wishes to proceed.       MWR/MEDQ  D:  01/19/2004  T:  01/19/2004  Job:  [id number]

## 2010-06-10 ENCOUNTER — Other Ambulatory Visit: Payer: Self-pay | Admitting: *Deleted

## 2010-06-10 DIAGNOSIS — I519 Heart disease, unspecified: Secondary | ICD-10-CM

## 2010-06-10 MED ORDER — CLOPIDOGREL BISULFATE 75 MG PO TABS: 75.0000 mg | ORAL_TABLET | Freq: Every day | ORAL | Status: DC

## 2010-06-10 NOTE — Telephone Encounter (Signed)
Refilled plavix and generic ok with Dr.Brackbill,notifed [facility].

## 2010-07-08 ENCOUNTER — Ambulatory Visit (HOSPITAL_COMMUNITY): Payer: PRIVATE HEALTH INSURANCE | Attending: Cardiology

## 2010-07-08 DIAGNOSIS — I219 Acute myocardial infarction, unspecified: Secondary | ICD-10-CM | POA: Insufficient documentation

## 2010-07-08 DIAGNOSIS — I379 Nonrheumatic pulmonary valve disorder, unspecified: Secondary | ICD-10-CM | POA: Insufficient documentation

## 2010-07-08 DIAGNOSIS — I252 Old myocardial infarction: Secondary | ICD-10-CM

## 2010-07-08 DIAGNOSIS — I10 Essential (primary) hypertension: Secondary | ICD-10-CM | POA: Insufficient documentation

## 2010-07-08 DIAGNOSIS — I059 Rheumatic mitral valve disease, unspecified: Secondary | ICD-10-CM | POA: Insufficient documentation

## 2010-07-08 DIAGNOSIS — Z01818 Encounter for other preprocedural examination: Secondary | ICD-10-CM

## 2010-07-10 ENCOUNTER — Encounter: Payer: Self-pay | Admitting: Cardiology

## 2010-07-15 ENCOUNTER — Encounter: Payer: Self-pay | Admitting: Cardiology

## 2010-07-15 ENCOUNTER — Other Ambulatory Visit (INDEPENDENT_AMBULATORY_CARE_PROVIDER_SITE_OTHER): Payer: PRIVATE HEALTH INSURANCE | Admitting: *Deleted

## 2010-07-15 ENCOUNTER — Ambulatory Visit (INDEPENDENT_AMBULATORY_CARE_PROVIDER_SITE_OTHER): Payer: PRIVATE HEALTH INSURANCE | Admitting: Cardiology

## 2010-07-15 VITALS — BP 140/70 | HR 58 | Wt 159.0 lb

## 2010-07-15 DIAGNOSIS — I4510 Unspecified right bundle-branch block: Secondary | ICD-10-CM

## 2010-07-15 DIAGNOSIS — I259 Chronic ischemic heart disease, unspecified: Secondary | ICD-10-CM

## 2010-07-15 DIAGNOSIS — Z79899 Other long term (current) drug therapy: Secondary | ICD-10-CM

## 2010-07-15 DIAGNOSIS — I519 Heart disease, unspecified: Secondary | ICD-10-CM

## 2010-07-15 DIAGNOSIS — I119 Hypertensive heart disease without heart failure: Secondary | ICD-10-CM

## 2010-07-15 DIAGNOSIS — I252 Old myocardial infarction: Secondary | ICD-10-CM

## 2010-07-15 DIAGNOSIS — M1990 Unspecified osteoarthritis, unspecified site: Secondary | ICD-10-CM

## 2010-07-15 DIAGNOSIS — E785 Hyperlipidemia, unspecified: Secondary | ICD-10-CM

## 2010-07-15 DIAGNOSIS — N189 Chronic kidney disease, unspecified: Secondary | ICD-10-CM

## 2010-07-15 LAB — CBC WITH DIFFERENTIAL/PLATELET
Basophils Absolute: 0 10*3/uL (ref 0.0–0.1)
Eosinophils Absolute: 0.3 10*3/uL (ref 0.0–0.7)
Eosinophils Relative: 4.8 % (ref 0.0–5.0)
MCV: 97.2 fl (ref 78.0–100.0)
Monocytes Absolute: 0.7 10*3/uL (ref 0.1–1.0)
Neutrophils Relative %: 65.3 % (ref 43.0–77.0)
Platelets: 188 10*3/uL (ref 150.0–400.0)
WBC: 7 10*3/uL (ref 4.5–10.5)

## 2010-07-15 LAB — BASIC METABOLIC PANEL
BUN: 49 mg/dL — ABNORMAL HIGH (ref 6–23)
Chloride: 107 mEq/L (ref 96–112)
Glucose, Bld: 94 mg/dL (ref 70–99)
Potassium: 5 mEq/L (ref 3.5–5.1)

## 2010-07-15 LAB — LIPID PANEL
Cholesterol: 154 mg/dL (ref 0–200)
LDL Cholesterol: 93 mg/dL (ref 0–99)

## 2010-07-15 LAB — HEPATIC FUNCTION PANEL
ALT: 18 U/L (ref 0–53)
AST: 18 U/L (ref 0–37)
Total Bilirubin: 0.7 mg/dL (ref 0.3–1.2)

## 2010-07-15 MED ORDER — CLOPIDOGREL BISULFATE 75 MG PO TABS: 75.0000 mg | ORAL_TABLET | Freq: Every day | ORAL | Status: DC

## 2010-07-15 NOTE — Assessment & Plan Note (Signed)
The patient has a history of known ischemic heart disease.  He had a history of an old anteroseptal myocardial infarction.  He has known old right bundle-branch block.  He has not been expressing any recent chest pain or angina.  He underwent echocardiogram on 07/08/10 which showed an ejection fraction of 40-45% with grade 1 diastolic dysfunction

## 2010-07-15 NOTE — Assessment & Plan Note (Signed)
The patient has a history of chronic renal insufficiency he's had no recent symptoms referable to his kidneys.

## 2010-07-15 NOTE — Progress Notes (Signed)
Peggye Ley Date of Birth:  1928/07/19 [facility] / [facility] [address]   [address] [place], [place]  [address] [phone]           Fax   [phone]  HPI: This 77 year old gentleman comes in for preoperative cardiology clearance.  He will need to have a left knee revision by Dr. Lequita Halt in August 2012.  The patient has a past history of known ischemic heart disease.  He had an acute anteroseptal myocardial infarction on March 22, 2007.  His last nuclear stress test was on 04/02/09 and showed an ejection fraction of 29% and a large area of infarct in the apical anterior area with mild peri-infarct ischemia.  He had a subsequent echocardiogram on 04/04/09 in which his ejection fraction appeared to be in the 45-50% range.  The patient has not been expressing any recent chest pain.  He does have exertional dyspnea which is chronic.  He has a right bundle branch block which is chronic and asymptomatic.  As a past history of essential hypertension and history of chronic renal insufficiency.  Current Outpatient Prescriptions  Medication Sig Dispense Refill  . allopurinol (ZYLOPRIM) 100 MG tablet TAKE ONE TABLET BY MOUTH EVERY DAY  90 tablet  3  . aspirin 81 MG tablet Take 81 mg by mouth every other day.        . clopidogrel (PLAVIX) 75 MG tablet Take 1 tablet (75 mg total) by mouth daily.  90 tablet  3  . isosorbide mononitrate (IMDUR) 30 MG 24 hr tablet TAKE ONE TABLET BY MOUTH IN THE MORNING  90 tablet  3  . metoprolol tartrate (LOPRESSOR) 25 MG tablet TAKE ONE-HALF TABLET BY MOUTH TWICE DAILY  90 tablet  3  . nitroGLYCERIN (NITROSTAT) 0.4 MG SL tablet Place 0.4 mg under the tongue every 5 (five) minutes as needed.        Marland Kitchen DISCONTD: clopidogrel (PLAVIX) 75 MG tablet Take 1 tablet (75 mg total) by mouth daily.  30 tablet  11  . DISCONTD: finasteride (PROSCAR) 5 MG tablet Take 5 mg by mouth daily.          Allergies  Allergen Reactions  . Crestor (Rosuvastatin Calcium)      MUSCLE PAIN  . Lipitor (Atorvastatin Calcium)     MUSCLE PAIN    Patient Active Problem List  Diagnoses  . Osteoarthritis  . Ischemic heart disease  . Right bundle branch block  . Chronic renal insufficiency  . Benign hypertensive heart disease without heart failure    History  Smoking status  . Never Smoker   Smokeless tobacco  . Not on file    History  Alcohol Use No    Family History  Problem Relation Age of Onset  . Coronary artery disease Mother     Review of Systems: The patient denies any heat or cold intolerance.  No weight gain or weight loss.  The patient denies headaches or blurry vision.  There is no cough or sputum production.  The patient denies dizziness.  There is no hematuria or hematochezia.  The patient denies any muscle aches or arthritis.  The patient denies any rash.  The patient denies frequent falling or instability.  There is no history of depression or anxiety.  All other systems were reviewed and are negative.   Physical Exam: Filed Vitals:   07/15/10 1433  BP: 140/70  Pulse: 58   The general appearance feels a well-developed well-nourished elderly gentleman in  no distress.Pupils equal and reactive.   Extraocular Movements are full.  There is no scleral icterus.  The mouth and pharynx are normal.  The neck is supple.  The carotids reveal no bruits.  The jugular venous pressure is normal.  The thyroid is not enlarged.  There is no lymphadenopathy.  He does have some chronic scarring along the right side of his face secondary to previous surgery chest is clear to percussion and auscultation.  Heart reveals no gallop murmur click or rub.  The abdomen is soft and nontender.  Extremities reveal no phlebitis or edema. His EKG today shows normal sinus rhythm with premature atrial beats.  He has a right bundle branch block pattern and a pattern of an old anteroseptal myocardial infarction.  Assessment / Plan: The patient's echocardiogram from 07/13/10  shows a stable ejection fraction of 40-45%.  The patient is not having any evidence of active ischemia.  We are clearing him from a cardiac standpoint for surgery.  He may leave off his Plavix 5 days prior to the surgery.

## 2010-07-15 NOTE — Assessment & Plan Note (Signed)
The patient has a history of osteoarthritis.  He has had a Left knee prosthesis which is giving him a lot of problem and he will need to have a revision of both the femoral and tibial components.  This will be done in August.

## 2010-07-16 ENCOUNTER — Encounter: Payer: Self-pay | Admitting: Cardiology

## 2010-07-17 ENCOUNTER — Encounter: Payer: Self-pay | Admitting: Cardiology

## 2010-07-17 ENCOUNTER — Telehealth: Payer: Self-pay | Admitting: *Deleted

## 2010-07-17 NOTE — Telephone Encounter (Signed)
Pt's wife notified of lab results and will have pt continue same medications.

## 2010-07-17 NOTE — Telephone Encounter (Signed)
Message copied by Adolphus Birchwood on Wed Jul 17, 2010 11:37 AM ------      Message from: [name]      Created: Tue Jul 16, 2010  8:50 AM       Please report.  Lipids are satisfactory.  His hemoglobin has come back up to normal.  His kidney function is stable.  His liver tests are stable.  Continue same medication

## 2010-08-16 ENCOUNTER — Telehealth: Payer: Self-pay | Admitting: Cardiology

## 2010-08-16 NOTE — Telephone Encounter (Signed)
[facility] requesting last ov, ekg, chest xray stress, nuc to be faxed to [phone]

## 2010-08-27 ENCOUNTER — Other Ambulatory Visit: Payer: Self-pay | Admitting: Orthopedic Surgery

## 2010-08-27 ENCOUNTER — Ambulatory Visit (HOSPITAL_COMMUNITY)
Admission: RE | Admit: 2010-08-27 | Discharge: 2010-08-27 | Disposition: A | Discharge status: Home / Self Care | Payer: Medicare Other | Source: Ambulatory Visit | Attending: Orthopedic Surgery | Admitting: Orthopedic Surgery

## 2010-08-27 ENCOUNTER — Encounter (HOSPITAL_COMMUNITY): Payer: Medicare Other

## 2010-08-27 ENCOUNTER — Other Ambulatory Visit (HOSPITAL_COMMUNITY): Payer: Self-pay | Admitting: Orthopedic Surgery

## 2010-08-27 DIAGNOSIS — Z96659 Presence of unspecified artificial knee joint: Secondary | ICD-10-CM

## 2010-08-27 DIAGNOSIS — Z01812 Encounter for preprocedural laboratory examination: Secondary | ICD-10-CM | POA: Insufficient documentation

## 2010-08-27 DIAGNOSIS — M6289 Other specified disorders of muscle: Secondary | ICD-10-CM | POA: Insufficient documentation

## 2010-08-27 LAB — URINALYSIS, ROUTINE W REFLEX MICROSCOPIC
Ketones, ur: NEGATIVE mg/dL
Leukocytes, UA: NEGATIVE
Nitrite: NEGATIVE
Protein, ur: 100 mg/dL — AB

## 2010-08-27 LAB — COMPREHENSIVE METABOLIC PANEL
AST: 17 U/L (ref 0–37)
Alkaline Phosphatase: 128 U/L — ABNORMAL HIGH (ref 39–117)
BUN: 47 mg/dL — ABNORMAL HIGH (ref 6–23)
CO2: 27 mEq/L (ref 19–32)
Chloride: 105 mEq/L (ref 96–112)
Creatinine, Ser: 2.98 mg/dL — ABNORMAL HIGH (ref 0.50–1.35)
GFR calc non Af Amer: 20 mL/min — ABNORMAL LOW (ref >60)
Total Bilirubin: 0.4 mg/dL (ref 0.3–1.2)

## 2010-08-27 LAB — PROTIME-INR
INR: 1.07 (ref 0.00–1.49)
Prothrombin Time: 14.1 seconds (ref 11.6–15.2)

## 2010-08-27 LAB — SURGICAL PCR SCREEN
MRSA, PCR: POSITIVE — AB
Staphylococcus aureus: POSITIVE — AB

## 2010-08-27 LAB — CBC
MCV: 95 fL (ref 78.0–100.0)
Platelets: 236 10*3/uL (ref 150–400)
RBC: 4.4 MIL/uL (ref 4.22–5.81)
WBC: 7.7 10*3/uL (ref 4.0–10.5)

## 2010-08-27 LAB — APTT: aPTT: 33 seconds (ref 24–37)

## 2010-09-03 ENCOUNTER — Inpatient Hospital Stay (HOSPITAL_COMMUNITY)
Admission: RE | Admit: 2010-09-03 | Discharge: 2010-09-05 | DRG: 468 | Disposition: A | Discharge status: Home Health | Payer: Medicare Other | Source: Ambulatory Visit | Attending: Orthopedic Surgery | Admitting: Orthopedic Surgery

## 2010-09-03 DIAGNOSIS — I2510 Atherosclerotic heart disease of native coronary artery without angina pectoris: Secondary | ICD-10-CM | POA: Diagnosis present

## 2010-09-03 DIAGNOSIS — B029 Zoster without complications: Secondary | ICD-10-CM | POA: Diagnosis present

## 2010-09-03 DIAGNOSIS — T84039A Mechanical loosening of unspecified internal prosthetic joint, initial encounter: Principal | ICD-10-CM | POA: Diagnosis present

## 2010-09-03 DIAGNOSIS — N400 Enlarged prostate without lower urinary tract symptoms: Secondary | ICD-10-CM | POA: Diagnosis present

## 2010-09-03 DIAGNOSIS — G8929 Other chronic pain: Secondary | ICD-10-CM | POA: Diagnosis present

## 2010-09-03 DIAGNOSIS — M549 Dorsalgia, unspecified: Secondary | ICD-10-CM | POA: Diagnosis present

## 2010-09-03 DIAGNOSIS — I252 Old myocardial infarction: Secondary | ICD-10-CM

## 2010-09-03 DIAGNOSIS — M109 Gout, unspecified: Secondary | ICD-10-CM | POA: Diagnosis present

## 2010-09-03 DIAGNOSIS — I129 Hypertensive chronic kidney disease with stage 1 through stage 4 chronic kidney disease, or unspecified chronic kidney disease: Secondary | ICD-10-CM | POA: Diagnosis present

## 2010-09-03 DIAGNOSIS — Z96649 Presence of unspecified artificial hip joint: Secondary | ICD-10-CM

## 2010-09-03 DIAGNOSIS — Z7982 Long term (current) use of aspirin: Secondary | ICD-10-CM

## 2010-09-03 DIAGNOSIS — Z981 Arthrodesis status: Secondary | ICD-10-CM

## 2010-09-03 DIAGNOSIS — Z87898 Personal history of other specified conditions: Secondary | ICD-10-CM

## 2010-09-03 DIAGNOSIS — Z96659 Presence of unspecified artificial knee joint: Secondary | ICD-10-CM

## 2010-09-03 DIAGNOSIS — Z01812 Encounter for preprocedural laboratory examination: Secondary | ICD-10-CM

## 2010-09-03 DIAGNOSIS — N189 Chronic kidney disease, unspecified: Secondary | ICD-10-CM | POA: Diagnosis present

## 2010-09-03 DIAGNOSIS — I259 Chronic ischemic heart disease, unspecified: Secondary | ICD-10-CM | POA: Diagnosis present

## 2010-09-03 DIAGNOSIS — Y831 Surgical operation with implant of artificial internal device as the cause of abnormal reaction of the patient, or of later complication, without mention of misadventure at the time of the procedure: Secondary | ICD-10-CM | POA: Diagnosis present

## 2010-09-03 DIAGNOSIS — Z79899 Other long term (current) drug therapy: Secondary | ICD-10-CM

## 2010-09-03 LAB — BASIC METABOLIC PANEL
CO2: 25 mEq/L (ref 19–32)
Calcium: 9.4 mg/dL (ref 8.4–10.5)
Chloride: 106 mEq/L (ref 96–112)
Glucose, Bld: 105 mg/dL — ABNORMAL HIGH (ref 70–99)
Sodium: 140 mEq/L (ref 135–145)

## 2010-09-04 LAB — PROTIME-INR: INR: 1.23 (ref 0.00–1.49)

## 2010-09-04 LAB — CBC
HCT: 30.1 % — ABNORMAL LOW (ref 39.0–52.0)
Hemoglobin: 10.2 g/dL — ABNORMAL LOW (ref 13.0–17.0)
MCHC: 33.9 g/dL (ref 30.0–36.0)
RBC: 3.2 MIL/uL — ABNORMAL LOW (ref 4.22–5.81)
WBC: 8.8 10*3/uL (ref 4.0–10.5)

## 2010-09-04 LAB — BASIC METABOLIC PANEL
BUN: 44 mg/dL — ABNORMAL HIGH (ref 6–23)
CO2: 22 mEq/L (ref 19–32)
Chloride: 107 mEq/L (ref 96–112)
GFR calc non Af Amer: 23 mL/min — ABNORMAL LOW (ref >60)
Glucose, Bld: 180 mg/dL — ABNORMAL HIGH (ref 70–99)
Potassium: 4.3 mEq/L (ref 3.5–5.1)
Sodium: 139 mEq/L (ref 135–145)

## 2010-09-04 NOTE — H&P (Signed)
NAMESALIL, [name] NO.:  [id number]  MEDICAL RECORD NO.:  [id number]  LOCATION:                                 FACILITY:  PHYSICIAN:  Ollen Gross, M.D.    DATE OF BIRTH:  19-Jun-1928  DATE OF ADMISSION: DATE OF DISCHARGE:                             HISTORY & PHYSICAL   CHIEF COMPLAINT:  Left knee pain.  HISTORY OF PRESENT ILLNESS:  The patient is a 77 year old male who has been seen by Dr. Lequita Halt  and he previously has undergone a left total knee in the past.  He has been having problems with his left knee for quite some time now with some instability and swelling and felt like he is having some mechanical issues and these have been already 14 years now.  He has had infection workup which has been negative and felt he would benefit undergoing revision surgery.  Risks and benefits were discussed.  He elects to proceed with surgery.  ALLERGIES:  No known drug allergies.  There are intolerances to CRESTOR and LIPITOR, both cause muscle pain.  CURRENT MEDICATIONS:  Imdur, Plavix, metoprolol, allopurinol, and aspirin.  PAST MEDICAL HISTORY:  Shingles, exertional dyspnea, osteoarthritis, hypertension, coronary arterial disease, history of myocardial infarction in 2009, right bundle-branch block, ischemic heart disease, benign prostatic hypertrophy, chronic renal insufficiency, history of anemia, history of lymphoma, history of gout, childhood illnesses of measles, mumps, and scarlet fever.  PAST SURGICAL HISTORY:  Right temple biopsy x2, removal of a lymphoma, right total hip replacement, right hip revision with bone grafting, left carpal tunnel TURP, left total knee replacement, bunion surgery, left hand surgery and left arm surgery, cervical fusions three levels, right ankle surgery, back surgery, left foot fourth toe amputation, lumbar fusion with screws and Ray cages, left ankle surgery, liner revision hip surgery, back surgery in 2007 again,  heart catheterization, and hip reconstructive surgery in 2010.  FAMILY HISTORY:  Father deceased at age 71 with congestive heart failure.  Mother with heart disease.  SOCIAL HISTORY:  Married, Visual merchandiser.  Nonsmoker.  No alcohol.  He has had 5 children, one is deceased.  He has a ramp entering home.  He has a living will and healthcare power of attorney.  REVIEW OF SYSTEMS:  GENERAL:  No fevers, chills, or night sweats. NEURO:  No seizure, syncope, or paralysis.  RESPIRATORY:  No shortness breath, productive cough, or hemoptysis.  CARDIOVASCULAR:  No chest pain, angina, or orthopnea.  GI:  No nausea, vomiting, diarrhea, or constipation.  GU:  No dysuria, hematuria, or discharge.  Does have a little bit of frequency.  MUSCULOSKELETAL:  Knee pain.  PHYSICAL EXAMINATION:  Vital Signs:  Pulse 68, respirations 18, and blood pressure 160/98. GENERAL:  A 77 year old white male, well-nourished, well-developed, in no acute distress, short stature, is alert, oriented, and cooperative. Good historian.  He is accompanied by his wife. HEENT:  Normocephalic and atraumatic.  Pupils are round and reactive. EOMs intact. NECK:  Supple.  No carotid bruits. CHEST:  Clear anterior and posterior chest wall.  No rhonchi, rales, or wheezing. HEART:  Regular rate and rhythm.  No murmurs. Abdomen:  Slightly protuberant, nontender.  Bowel  sounds present. RECTAL/BREASTS/GENITALIA:  Not done, not pertinent to present illness. EXTREMITIES:  Left knee has varus valgus laxity, has a large effusion, some AP laxity at 90 degrees of flexion.  IMPRESSION:  Loose left total knee.  PLAN:  The patient was admitted to [facility] to undergo revision left total knee arthroplasty.  Surgery will be performed by Dr. Ollen Gross.     [name], P.A.C.   ______________________________ Ollen Gross, M.D.    [name]  D:  09/01/2010  T:  09/02/2010  Job:  [id number]  cc:   [name],  M.D. Fax: [phone]  Electronically Signed by Patrica Duel P.A.C. on 09/03/2010 10:11:28 AM Electronically Signed by Ollen Gross M.D. on 09/04/2010 10:25:59 AM

## 2010-09-05 LAB — BASIC METABOLIC PANEL
CO2: 23 mEq/L (ref 19–32)
Calcium: 8.4 mg/dL (ref 8.4–10.5)
GFR calc non Af Amer: 21 mL/min — ABNORMAL LOW (ref >60)
Glucose, Bld: 162 mg/dL — ABNORMAL HIGH (ref 70–99)
Potassium: 3.8 mEq/L (ref 3.5–5.1)
Sodium: 137 mEq/L (ref 135–145)

## 2010-09-05 LAB — PROTIME-INR: INR: 1.7 — ABNORMAL HIGH (ref 0.00–1.49)

## 2010-09-05 LAB — CBC
HCT: 28.8 % — ABNORMAL LOW (ref 39.0–52.0)
Hemoglobin: 9.9 g/dL — ABNORMAL LOW (ref 13.0–17.0)
MCV: 94.7 fL (ref 78.0–100.0)
RBC: 3.04 MIL/uL — ABNORMAL LOW (ref 4.22–5.81)
RDW: 13.9 % (ref 11.5–15.5)
WBC: 8.3 10*3/uL (ref 4.0–10.5)

## 2010-09-09 NOTE — Op Note (Signed)
NAMEVELMER, WOELFEL NO.:  [id number]  MEDICAL RECORD NO.:  [id number]  LOCATION:  1602                         FACILITY:  [facility]  PHYSICIAN:  Ollen Gross, M.D.    DATE OF BIRTH:  June 24, 1928  DATE OF PROCEDURE: DATE OF DISCHARGE:                              OPERATIVE REPORT   PREOPERATIVE DIAGNOSIS:  Loose left total knee arthroplasty.  POSTOPERATIVE DIAGNOSIS:  Loose left total knee arthroplasty.  PROCEDURE:  Left total knee arthroplasty revision.  SURGEON:  Ollen Gross, MD  ASSISTANT:  Alexzandrew L. Perkins, PAC  ANESTHESIA:  General.  ESTIMATED BLOOD LOSS:  Minimal.  DRAIN:  Hemovac x1.  TOURNIQUET TIME:  Up 55 minutes at 300 mmHg, down 10 minutes up additional 24 minutes at 300 mmHg.  COMPLICATIONS:  None.  CONDITION:  Stable to Recovery.  BRIEF CLINICAL NOTE:  Mr. [name] is a 77 year old male with a painful unstable grossly loose left total knee arthroplasty.  He has severe pain and dysfunction and the knee is giving out on him .  He has had recurrent effusions due to the instability.  He presents now for left total knee arthroplasty revision.  PROCEDURE IN DETAIL:  After successful administration of general anesthetic, a tourniquet was placed high on his left thigh and his left lower extremity was prepped and draped in usual sterile fashion.  Knee was flexed and tourniquet inflated to 300 mmHg.  Previous midline incision was made with a 10 blade through subcutaneous tissue to the level of the extensor mechanism.  A fresh blade was used make a medial parapatellar arthrotomy.  Soft tissue on the proximal medial tibia subperiosteally elevated to joint line with the knife into the semimembranosus bursa with a Cobb elevator.  He has a tremendous amount of metal-stained tissue as well as osteolytic debris in the joint.  I did a thorough synovectomy to remove all this back to normal-appearing tissue.  I was then able to evert the patella  and flexing the knee 90 degrees.  The tibia was grossly loose and just was bending into the tibial tray moving.  I then flexed the knee 90 degrees, subluxed tibia forward and removed the tibial polyethylene.  I was then able to literally pick the tibial tray out of the bone with my fingers.  I did not need to disrupt any of the interfaces as this was grossly loose.  We then disrupted the interface between the femoral component of bone with an osteotome.  I barely needed to utilize the osteotome to pry the femoral component off.  There was no bone loss with removing into the femoral or tibial component.  However, there was fair amount of fibrinous debris that was underneath both components.  I removed this to get back to bone.  We then used the drill to create the starter point for the entrance into the medullary canal of the femur and tibia.  Both canals were then thoroughly irrigated with saline solution and removed the fatty contents.  I reamed on the femoral side up to 20 mm to get a good Press-Fit and reamed to 13 mm on the tibial side for placement of 13 mm cemented stem.  The tibia was then subluxed forward and retractors placed.  The extramedullary tibial alignment guide was placed referencing proximally to medial aspect of the tibial tubercle and distally along the second metatarsal axis tibial crest.  She had a very large defect medially due to the severe varus wear of the component.  I did not want to cut all the way down there cross, thus I just resected about 3 mm from the proximal and lateral side to build the platform laterally and then about 10 mm below this, I did the step-cut to place the asymmetric augment which was going to be 15 mm on the lateral side and 5 mm on the medial side.  The resections were made with an oscillating saw down to healthy- appearing bone.  The size 3 is the most appropriate tibial component. Proximally, I then reamed through the size 3 and then  did a broach preparation for 29 mm sleeve.  We then placed the trial together which was a size 3 in between revision tray with a 5 mm augment laterally and a 15 mm augment medially.  A 29 sleeve was placed in neutral position and a 13 x 30 stem extension was placed.  I impacted this trial with excellent fit on the cut tibial surfaces.  We then prepared the femur.  I placed a 20 mm reamer to serve as our intramedullary guide.  The 5 degrees left valgus alignment guide was placed and about 4 mm needed to be taken on each side to get back to healthy bone.  I thus decided to use 4 mm augment distally both medial and lateral.  Size four appeared to be the most appropriate femoral component.  The AP cutting block was placed in the plus 2 position to effectively raise the stem in the lower the trochlear flange down to the anterior cortex of the femur.  The rotations marked at the epicondylar axis and confirmed by creating a symmetric flexion gap at 90 degrees. This was done using a spacer, where the block was pinned in its rotation.  Anteriorly, would again bone posteriorly at +4 position on each side in order to get bone, thus 4 mm augments were needed medially and lateral on the posterior femur.  The revision block was placed and the intercondylar cut made for the TC3.  The neck in the bone and chamfer cuts.  Trial femur was then set, which was a size 4 TC3 steamer with 5 mm medial and lateral distal augments and 5 mm medial and lateral posterior augments with the stem in a +2 position and 5 degrees of valgus and then a 20 x 75 stem extension was placed.  This trial was placed along the tibial trial.  A 15-mm trial inserts placed.  Full extensions achieved with excellent varus-valgus and anterior-posterior balance throughout full range of motion.  Patella was everted.  This was a metal back LCS patella.  I removed the polyethylene portion of this did not use an oscillating saw and small  osteotome to remove the metal back portion of this.  This was removed with minimal if any bone loss. The thickness of the bone and 15 mm.  I cut down to 12 mm.  A 38 template was placed, lug holes were drilled, trial patella was placed, it tracks normally.  At this time the tourniquet time 55 minutes.  A moist sponge was placed and the tourniquet released.  It was down 10 minutes.  There was minimal bleeding in the  bleeding that was identified was stopped with cautery.  During this time, I also assembled the components on the back table.  Once the components were fully assembled and at 10 minutes from the time the tourniquet was released, I subsequently rewrapped leg in Esmarch and reinflated the tourniquet would have to 300 mmHg.  The knee was then flexed and trials removed. Trial of the tibial cement restrictor and size 4 is most appropriate. The size 4 restrictor was placed at the appropriate depth in the tibial canal.  The bone surface was then prepared with pulsatile lavage. Cement was mixed.  Once ready for implantation, the tibial component was cemented and placed cementing both the stem and proximally.  The impacted and all extruded cement removed.  Femoral component cemented distally then it was pressed with Press-Fit.  There was also impacted and extruded cement removed.  A 15-mm trial spacer was placed.  Knee held in full extension.  Once again all extruded cement was removed. The 38 patella was then cemented into place and held with a clamp.  15 mm trial inserts placed.  There was great stability, anterior-posterior and varus-valgus both full extension and 390 degrees of flexion.  A permanent 15 mm TC3 rotating platform insert was placed into the tibial tray.  Wound was copiously irrigated with saline solution.  The center mechanism was then closed over Hemovac drain with interrupted #1 PDS. Flexion against gravity 130 degrees and patella tracks normally. Tourniquet released  second time of 24 minutes.  Subcu was closed with interrupted 2-0 Vicryl and skin closed with staples. Catheter for Marcaine pain pump placed and pumps initiated.  The incision was then cleaned and dried and a bulky sterile dressing was applied.  He was then placed into a knee immobilizer, awakened, and transferred to recovery in stable condition.     Ollen Gross, M.D.     FA/MEDQ  D:  09/03/2010  T:  09/04/2010  Job:  [id number]  Electronically Signed by Ollen Gross M.D. on 09/09/2010 04:38:30 PM

## 2010-09-10 ENCOUNTER — Other Ambulatory Visit (HOSPITAL_COMMUNITY): Payer: Self-pay | Admitting: Orthopedic Surgery

## 2010-09-10 ENCOUNTER — Ambulatory Visit (HOSPITAL_COMMUNITY)
Admission: RE | Admit: 2010-09-10 | Discharge: 2010-09-10 | Disposition: A | Discharge status: Home / Self Care | Payer: Medicare Other | Source: Ambulatory Visit | Attending: Orthopedic Surgery | Admitting: Orthopedic Surgery

## 2010-09-10 DIAGNOSIS — M79669 Pain in unspecified lower leg: Secondary | ICD-10-CM

## 2010-09-10 DIAGNOSIS — M7989 Other specified soft tissue disorders: Secondary | ICD-10-CM

## 2010-09-10 DIAGNOSIS — Z96659 Presence of unspecified artificial knee joint: Secondary | ICD-10-CM | POA: Insufficient documentation

## 2010-09-16 ENCOUNTER — Telehealth: Payer: Self-pay | Admitting: Cardiology

## 2010-09-16 MED ORDER — FUROSEMIDE 40 MG PO TABS: ORAL_TABLET | ORAL | Status: DC

## 2010-09-16 NOTE — Telephone Encounter (Signed)
Left knee surgery on 8/14 and 8/17 leg started swelling real bad.  She took him back to see Dr Despina Hick on 8/23.  Started him on Keflex and leg looks better.  He now has swelling in both feet and ankles, left worse than right.  States it is pitting edema.   Does keep his legs elevated, does not go down at night.  Urinating ok.  Denies increase in salt.  Not on any diuretics, denies shortness of breath.

## 2010-09-16 NOTE — Telephone Encounter (Signed)
Add Lasix 40 mg one daily for the next 3 days and see if fluid resolves.

## 2010-09-16 NOTE — Telephone Encounter (Signed)
Patient's wife would like for you to call her back regarding her husband's edema in his legs

## 2010-09-16 NOTE — Telephone Encounter (Signed)
Left message

## 2010-09-16 NOTE — Telephone Encounter (Signed)
Advised wife, will call back Thursday with update

## 2010-09-19 ENCOUNTER — Telehealth: Payer: Self-pay | Admitting: *Deleted

## 2010-09-19 NOTE — Telephone Encounter (Signed)
FYI Swelling in leg/foot better.  He saw Dr Despina Hick yesterday and he was very pleased with progress. He will discontinue furosemide and just use prn.  Advised wife if he needed again to let us know.

## 2010-09-19 NOTE — Telephone Encounter (Signed)
Agree with plan 

## 2010-09-25 NOTE — Discharge Summary (Signed)
[name], [name] NO.:  [id number]  MEDICAL RECORD NO.:  [id number]  LOCATION:  1602                         FACILITY:  [facility]  PHYSICIAN:  Ollen Gross, M.D.    DATE OF BIRTH:  1928/02/16  DATE OF ADMISSION:  09/03/2010 DATE OF DISCHARGE:  09/05/2010                              DISCHARGE SUMMARY   ADMITTING DIAGNOSES: 1. Loose left total knee arthroplasty. 2. Shingles. 3. Exertional dyspnea. 4. Osteoarthritis. 5. Hypertension. 6. Coronary arterial disease. 7. History of myocardial infarction in 2009. 8. Right bundle branch block. 9. Ischemic heart disease. 10.Benign prostatic hypertrophy. 11.Chronic renal insufficiency. 12.History of anemia. 13.History of lymphoma. 14.History of gout. 15.Childhood illnesses measles, mumps, scarlet fever.  DISCHARGE DIAGNOSES: 1. Loose left total knee arthroplasty, status post revision left total     knee arthroplasty. 2. Shingles. 3. Exertional dyspnea. 4. Osteoarthritis. 5. Hypertension. 6. Coronary arterial disease. 7. History of myocardial infarction in 2009. 8. Right bundle branch block. 9. Ischemic heart disease. 10.Benign prostatic hypertrophy. 11.Chronic renal insufficiency. 12.History of anemia. 13.History of lymphoma. 14.History of gout. 15.Childhood illnesses measles, mumps, scarlet fever.  PROCEDURE:  September 03, 2010, revision left total knee arthroplasty. Surgeon, Dr. Lequita Halt.  Assistant, Patrica Duel, PA-C.  Anesthesia general.  Tourniquet time up 55 minutes, down for 10 minutes, and up an additional 24 minutes.  CONSULTS:  None.  BRIEF HISTORY:  [name] is a 77 year old male with painfully unstable gross loose left total knee arthroplasty.  He has severe pain and dysfunction, knees given out.  He has recurrent effusions due to knee instability.  He now presents for revision surgery.  LABORATORY DATA:  The admission CBC is not scanned into the chart, but his admission  hemoglobin was 13.4.  Serial CBCs were followed. Hemoglobin dropped down to 10.2, last done hemoglobin and hematocrit of 9.9 and 28.8.  Admission prothrombin time INR is not scanned into the chart, not available, serial prothrombin times were followed, last noted PT/INR prior to discharge was 20.3 and 1.70.  Admission Chem panel, not available, not scanned into the chart, but serial BMETs were followed for 72 hours.  The electrolytes remained within normal limits.  His BUN and creatinine on day #1 was 52 and 2.93, on day #2 was 44 and 2.69, on day #3 was 44 and 2.88.  Please note the patient has known renal insufficiency.  Blood group type O positive.  HOSPITAL COURSE:  The patient was admitted to [facility], taken to OR, and underwent above-stated procedure without complication. The patient tolerated procedure well, later transferred to recovery room and then to the orthopedic floor.  Given p.o. and IV analgesic for pain control following surgery.  Actually, he did pretty well on the evening of surgery and looked good on the morning of day #1 and had good pain control.  Hemoglobin was stable.  He was started back on all of his home medications.  His preop BUN and creatinine were 47 and 2.98 and followup on that came down to 44 and 2.69.  He had known chronic renal insufficiency and this was stable postoperatively.  He was started back on all of his cardiac medications.  He had  excellent urinary output. Started getting up out of bed and worked very well with therapy on day #1 and by day #2, he was doing well, had good pain control, was seen by Dr. Lequita Halt and wanted to go home.  DISCHARGE PLAN: 1. The patient discharged home on September 05, 2010. 2. Discharge diagnosis, please see above. 3. Discharge medications, Robaxin, OxyIR, Coumadin.  Continue home     medications of allopurinol, enteric-coated aspirin, Imdur,     metoprolol, and trifluridine ophthalmic drops. 4. Diet,  heart-healthy cardiac diet. 5. Activity, he is weightbearing as tolerated, total knee protocol. 6. Follow up in 2 weeks.  DISPOSITION:  Home.  CONDITION UPON DISCHARGE:  Improving.     [name], P.A.C.   ______________________________ Ollen Gross, M.D.    ALP/MEDQ  D:  09/19/2010  T:  09/20/2010  Job:  [id number]  cc:   [name], M.D. Fax: [phone]  Electronically Signed by Patrica Duel P.A.C. on 09/24/2010 08:07:10 AM Electronically Signed by Ollen Gross M.D. on 09/25/2010 10:03:38 AM

## 2010-10-14 LAB — BASIC METABOLIC PANEL
BUN: 34 — ABNORMAL HIGH
CO2: 20
CO2: 24
CO2: 24
CO2: 26
Calcium: 8.4
Calcium: 8.5
Calcium: 8.6
Calcium: 8.8
Chloride: 104
Creatinine, Ser: 1.92 — ABNORMAL HIGH
Creatinine, Ser: 1.96 — ABNORMAL HIGH
Creatinine, Ser: 2.16 — ABNORMAL HIGH
GFR calc Af Amer: 34 — ABNORMAL LOW
GFR calc Af Amer: 40 — ABNORMAL LOW
GFR calc Af Amer: 41 — ABNORMAL LOW
GFR calc non Af Amer: 29 — ABNORMAL LOW
GFR calc non Af Amer: 30 — ABNORMAL LOW
GFR calc non Af Amer: 30 — ABNORMAL LOW
GFR calc non Af Amer: 34 — ABNORMAL LOW
Glucose, Bld: 100 — ABNORMAL HIGH
Glucose, Bld: 98
Glucose, Bld: 98
Potassium: 3.7
Potassium: 4.2
Potassium: 5
Sodium: 133 — ABNORMAL LOW
Sodium: 137
Sodium: 138
Sodium: 139

## 2010-10-14 LAB — CBC
HCT: 34.9 — ABNORMAL LOW
HCT: 35.8 — ABNORMAL LOW
HCT: 36.5 — ABNORMAL LOW
Hemoglobin: 11.4 — ABNORMAL LOW
Hemoglobin: 11.8 — ABNORMAL LOW
Hemoglobin: 12.1 — ABNORMAL LOW
Hemoglobin: 12.5 — ABNORMAL LOW
MCHC: 33.5
MCHC: 33.8
MCHC: 33.8
MCHC: 34
MCHC: 34.3
Platelets: 161
Platelets: 180
Platelets: 186
Platelets: 192
RBC: 3.81 — ABNORMAL LOW
RBC: 3.86 — ABNORMAL LOW
RBC: 4.22
RDW: 14.1
RDW: 14.3
RDW: 14.4
RDW: 14.5
RDW: 14.5
WBC: 10.9 — ABNORMAL HIGH
WBC: 7.6
WBC: 9.4

## 2010-10-14 LAB — CARDIAC PANEL(CRET KIN+CKTOT+MB+TROPI)
CK, MB: 287.7 — ABNORMAL HIGH
CK, MB: 388.1 — ABNORMAL HIGH
Total CK: 1082 — ABNORMAL HIGH
Total CK: 1614 — ABNORMAL HIGH
Total CK: 1896 — ABNORMAL HIGH
Troponin I: 17.92
Troponin I: 42.91
Troponin I: 53.73

## 2010-10-14 LAB — HEPARIN LEVEL (UNFRACTIONATED)
Heparin Unfractionated: 0.61
Heparin Unfractionated: 0.64

## 2010-10-14 LAB — URINALYSIS, ROUTINE W REFLEX MICROSCOPIC
Bilirubin Urine: NEGATIVE
Hgb urine dipstick: NEGATIVE
Specific Gravity, Urine: 1.02
Urobilinogen, UA: 0.2

## 2010-10-14 LAB — COMPREHENSIVE METABOLIC PANEL
CO2: 28
Calcium: 9.4
Creatinine, Ser: 2.1 — ABNORMAL HIGH
GFR calc Af Amer: 37 — ABNORMAL LOW
GFR calc non Af Amer: 31 — ABNORMAL LOW
Glucose, Bld: 154 — ABNORMAL HIGH
Total Protein: 6.8

## 2010-10-14 LAB — B-NATRIURETIC PEPTIDE (CONVERTED LAB): Pro B Natriuretic peptide (BNP): 838 — ABNORMAL HIGH

## 2010-10-14 LAB — PROTIME-INR: INR: 1.2

## 2010-10-14 LAB — D-DIMER, QUANTITATIVE: D-Dimer, Quant: 0.82 — ABNORMAL HIGH

## 2010-10-14 LAB — APTT: aPTT: 32

## 2010-10-14 LAB — URIC ACID: Uric Acid, Serum: 6.1

## 2010-10-14 LAB — LIPID PANEL: VLDL: 12

## 2010-11-04 ENCOUNTER — Telehealth: Payer: Self-pay | Admitting: Cardiology

## 2010-11-04 NOTE — Telephone Encounter (Signed)
Continue to take the furosemide 40 mg daily until her breathing gets back to normal and then use when necessary for dyspnea.

## 2010-11-04 NOTE — Telephone Encounter (Signed)
Advised wife and if no better after a couple of days of Lasix to call back and would get him in with Lawson Fiscal NP. Wife stated he said his breathing was better today

## 2010-11-04 NOTE — Telephone Encounter (Signed)
Pt's wife called. She said they were out of town and she says he has been out of breath please call

## 2010-11-04 NOTE — Telephone Encounter (Signed)
Wheezing and shortness of breath just getting up to go to the bathroom.  Daughter did get BNP and it was 411.  Were out of town and no lasix with them.  Went to visit daughter, about a 4 hour drive.  They got home yesterday and wife gave him a Lasix and his shortness of breath is a little better today.  Has not heard anymore wheezing.  Has had a little edema in leg all along.  Have requested paper chart.  Please advise

## 2010-12-04 ENCOUNTER — Ambulatory Visit (INDEPENDENT_AMBULATORY_CARE_PROVIDER_SITE_OTHER): Payer: Medicare Other | Admitting: Cardiology

## 2010-12-04 ENCOUNTER — Other Ambulatory Visit (INDEPENDENT_AMBULATORY_CARE_PROVIDER_SITE_OTHER): Payer: Medicare Other | Admitting: *Deleted

## 2010-12-04 ENCOUNTER — Encounter: Payer: Self-pay | Admitting: Cardiology

## 2010-12-04 VITALS — BP 138/88 | HR 66 | Ht 64.0 in | Wt 162.0 lb

## 2010-12-04 DIAGNOSIS — I519 Heart disease, unspecified: Secondary | ICD-10-CM

## 2010-12-04 DIAGNOSIS — Z79899 Other long term (current) drug therapy: Secondary | ICD-10-CM

## 2010-12-04 DIAGNOSIS — M1990 Unspecified osteoarthritis, unspecified site: Secondary | ICD-10-CM

## 2010-12-04 DIAGNOSIS — I259 Chronic ischemic heart disease, unspecified: Secondary | ICD-10-CM

## 2010-12-04 DIAGNOSIS — N189 Chronic kidney disease, unspecified: Secondary | ICD-10-CM

## 2010-12-04 DIAGNOSIS — I119 Hypertensive heart disease without heart failure: Secondary | ICD-10-CM

## 2010-12-04 MED ORDER — HYDROCHLOROTHIAZIDE 12.5 MG PO CAPS: 12.5000 mg | ORAL_CAPSULE | Freq: Every day | ORAL | Status: DC

## 2010-12-04 NOTE — Patient Instructions (Signed)
Start Hydrochlorothiazide 12.5 mg daily, Rx sent to [facility] Your physician recommends that you schedule a follow-up appointment in: 1 month office visit and BMET with Lawson Fiscal NP or  Dr. Patty Sermons

## 2010-12-04 NOTE — Progress Notes (Signed)
[name] Date of Birth:  1928/04/22 [facility] [address]   [address] [place], [place]  [address] [phone]           Fax   [phone]  History of Present Illness: This pleasant 77 year old gentleman is seen for a scheduled 4 month followup office visit.  As a history of known ischemic heart disease.  He had an acute anterior septal myocardial infarction in March 2009.  He presented late and did not have reperfusion.  He has a large area of infarct in the apical anterior area on his most recent nuclear stress test of 04/02/09.  There was only mild peri-infarct ischemia.  His echocardiogram on 04/04/2009 showed an ejection fraction of 45-50%.  He has a right bundle branch block which is asymptomatic.  He has a history of chronic renal insufficiency with serum creatinines in the 2.8 range.  He has a history of essential hypertension.  Current Outpatient Prescriptions  Medication Sig Dispense Refill  . allopurinol (ZYLOPRIM) 100 MG tablet TAKE ONE TABLET BY MOUTH EVERY DAY  90 tablet  3  . aspirin 81 MG tablet Take 81 mg by mouth every other day.        . clopidogrel (PLAVIX) 75 MG tablet Take 1 tablet (75 mg total) by mouth daily.  90 tablet  3  . furosemide (LASIX) 40 MG tablet As needed       . isosorbide mononitrate (IMDUR) 30 MG 24 hr tablet TAKE ONE TABLET BY MOUTH IN THE MORNING  90 tablet  3  . metoprolol tartrate (LOPRESSOR) 25 MG tablet TAKE ONE-HALF TABLET BY MOUTH TWICE DAILY  90 tablet  3  . hydrochlorothiazide (MICROZIDE) 12.5 MG capsule Take 1 capsule (12.5 mg total) by mouth daily.  90 capsule  3  . nitroGLYCERIN (NITROSTAT) 0.4 MG SL tablet Place 0.4 mg under the tongue every 5 (five) minutes as needed.          Allergies  Allergen Reactions  . Crestor (Rosuvastatin Calcium)     MUSCLE PAIN  . Lipitor (Atorvastatin Calcium)     MUSCLE PAIN    Patient Active Problem List  Diagnoses  . Osteoarthritis  . Ischemic heart disease  .  Right bundle branch block  . Chronic renal insufficiency  . Benign hypertensive heart disease without heart failure    History  Smoking status  . Never Smoker   Smokeless tobacco  . Not on file    History  Alcohol Use No    Family History  Problem Relation Age of Onset  . Coronary artery disease Mother     Review of Systems: Constitutional: no fever chills diaphoresis or fatigue or change in weight.  Head and neck: no hearing loss, no epistaxis, no photophobia or visual disturbance. Respiratory: No cough, shortness of breath or wheezing. Cardiovascular: No chest pain peripheral edema, palpitations. Gastrointestinal: No abdominal distention, no abdominal pain, no change in bowel habits hematochezia or melena. Genitourinary: No dysuria, no frequency, no urgency, no nocturia. Musculoskeletal:No arthralgias, no back pain, no gait disturbance or myalgias. Neurological: No dizziness, no headaches, no numbness, no seizures, no syncope, no weakness, no tremors. Hematologic: No lymphadenopathy, no easy bruising. Psychiatric: No confusion, no hallucinations, no sleep disturbance.    Physical Exam: Filed Vitals:   12/04/10 1637  BP: 138/88  Pulse: 66   The general appearance reveals an elderly gentleman in no acute distress.Pupils equal and reactive.   Extraocular Movements are full.  There  is no scleral icterus.  The mouth and pharynx are normal.  The neck is supple.  The carotids reveal no bruits.  The jugular venous pressure is normal.  The thyroid is not enlarged.  There is no lymphadenopathy.  The chest is clear to percussion and auscultation. There are no rales or rhonchi. Expansion of the chest is symmetrical.  Heart reveals no murmur gallop rub or clickThe abdomen is soft and nontender. Bowel sounds are normal. The liver and spleen are not enlarged. There Are no abdominal masses. There are no bruits.  Extremities showed trace edemaStrength is normal and symmetrical in all  extremities.  There is no lateralizing weakness.  There are no sensory deficits.  The skin is warm and dry.  There is no rash.   Assessment / Plan: Start hydrochlorothiazide 12.5 mg daily.  Return in one month for followup office visit and basal metabolic panel with me or with Lawson Fiscal

## 2010-12-04 NOTE — Assessment & Plan Note (Signed)
The patient has not had any recurrent angina pectoris.  Is not to take any sublingual nitroglycerin recently.

## 2010-12-04 NOTE — Assessment & Plan Note (Signed)
The patient's blood pressure has been running high recently.  In the past he was on a low dose of hydrochlorothiazide with improvement.  We'll restart hydrochlorothiazide 12.5 mg daily

## 2010-12-04 NOTE — Assessment & Plan Note (Signed)
Since I last saw the patient he has had successful revision and replacement of a previously loose left total knee replacement he is now doing well.  He still has some residual swelling

## 2010-12-05 LAB — BASIC METABOLIC PANEL
Chloride: 107 mEq/L (ref 96–112)
Creatinine, Ser: 2.9 mg/dL — ABNORMAL HIGH (ref 0.4–1.5)
Sodium: 139 mEq/L (ref 135–145)

## 2010-12-05 LAB — LIPID PANEL
Cholesterol: 153 mg/dL (ref 0–200)
HDL: 49.5 mg/dL (ref >39.00)
LDL Cholesterol: 91 mg/dL (ref 0–99)
Total CHOL/HDL Ratio: 3
Triglycerides: 64 mg/dL (ref 0.0–149.0)
VLDL: 12.8 mg/dL (ref 0.0–40.0)

## 2010-12-05 LAB — HEPATIC FUNCTION PANEL
Albumin: 3.8 g/dL (ref 3.5–5.2)
Alkaline Phosphatase: 125 U/L — ABNORMAL HIGH (ref 39–117)
Total Protein: 6.6 g/dL (ref 6.0–8.3)

## 2010-12-13 ENCOUNTER — Telehealth: Payer: Self-pay | Admitting: Cardiology

## 2010-12-13 ENCOUNTER — Telehealth: Payer: Self-pay | Admitting: *Deleted

## 2010-12-13 DIAGNOSIS — R062 Wheezing: Secondary | ICD-10-CM

## 2010-12-13 MED ORDER — ALBUTEROL SULFATE HFA 108 (90 BASE) MCG/ACT IN AERS: 2.0000 | INHALATION_SPRAY | Freq: Four times a day (QID) | RESPIRATORY_TRACT | Status: DC | PRN

## 2010-12-13 NOTE — Telephone Encounter (Signed)
Please call him in ProAir HFA 2 puffs every 6 hours when necessary.

## 2010-12-13 NOTE — Telephone Encounter (Signed)
Advised wife.  Wife wants to know if he can get a hand held nebulizer called to [facility] pharmacy, wheezing some yesterday

## 2010-12-13 NOTE — Telephone Encounter (Signed)
Message copied by Burnell Blanks on Fri Dec 13, 2010 12:09 PM ------      Message from: [name]      Created: Sun Dec 08, 2010  8:51 PM       Pl report.  Lipids are good. LFTs nl. Kidney function stable. Creatinine 2.9 drink plenty of water.

## 2010-12-13 NOTE — Telephone Encounter (Signed)
Left message and called to pharmacy

## 2010-12-13 NOTE — Telephone Encounter (Signed)
Fu call °Pt returning your call  °

## 2010-12-13 NOTE — Telephone Encounter (Signed)
Advised wife of labs 

## 2010-12-30 ENCOUNTER — Encounter: Payer: Self-pay | Admitting: Nurse Practitioner

## 2010-12-30 ENCOUNTER — Ambulatory Visit (INDEPENDENT_AMBULATORY_CARE_PROVIDER_SITE_OTHER): Payer: Medicare Other | Admitting: Nurse Practitioner

## 2010-12-30 VITALS — BP 120/68 | HR 62 | Ht 64.0 in | Wt 167.8 lb

## 2010-12-30 DIAGNOSIS — I259 Chronic ischemic heart disease, unspecified: Secondary | ICD-10-CM

## 2010-12-30 DIAGNOSIS — N189 Chronic kidney disease, unspecified: Secondary | ICD-10-CM

## 2010-12-30 DIAGNOSIS — N289 Disorder of kidney and ureter, unspecified: Secondary | ICD-10-CM

## 2010-12-30 DIAGNOSIS — I119 Hypertensive heart disease without heart failure: Secondary | ICD-10-CM

## 2010-12-30 DIAGNOSIS — I10 Essential (primary) hypertension: Secondary | ICD-10-CM

## 2010-12-30 LAB — BASIC METABOLIC PANEL
BUN: 52 mg/dL — ABNORMAL HIGH (ref 6–23)
CO2: 28 mEq/L (ref 19–32)
Calcium: 8.9 mg/dL (ref 8.4–10.5)
Chloride: 103 mEq/L (ref 96–112)
Creatinine, Ser: 3 mg/dL — ABNORMAL HIGH (ref 0.4–1.5)
GFR: 21.84 mL/min — ABNORMAL LOW (ref >60.00)
Glucose, Bld: 96 mg/dL (ref 70–99)
Potassium: 3.7 mEq/L (ref 3.5–5.1)
Sodium: 141 mEq/L (ref 135–145)

## 2010-12-30 NOTE — Assessment & Plan Note (Signed)
We will be rechecking labs today.

## 2010-12-30 NOTE — Patient Instructions (Addendum)
Stay on your current medicines.  Your blood pressure looks good.  We will recheck your labs today.  We will see you back in 4 months.  Call the [facility] office at [phone] if you have any questions, problems or concerns.

## 2010-12-30 NOTE — Progress Notes (Signed)
Peggye Ley Date of Birth: 05/15/1928 Medical Record #[id number]  History of Present Illness: Mr. Runyon is seen back today for a follow up visit. He is seen for Dr. Patty Sermons. He has a known history of HTN, CAD, remote MI, and CKD. He has had his HCTZ restarted for his blood pressure. He has an EF of 45 to 50% per echo back in March of 2011. Last nuclear in March of 2011 showing large area of infarct with only mild peri infarct ischemia with an EF of 27%. He is managed medically.   He is back on very low dose HCTZ for his blood pressure. It has improved. Having less swelling of his feet. Has had flare up of what he presumed was gout. His wife gave him 2 doses of colchicine with improvement. He does need follow up labs today. No chest pain. Has chronic DOE.   Current Outpatient Prescriptions on File Prior to Visit  Medication Sig Dispense Refill  . albuterol (PROAIR HFA) 108 (90 BASE) MCG/ACT inhaler Inhale 2 puffs into the lungs every 6 (six) hours as needed for wheezing.  6.7 g  1  . allopurinol (ZYLOPRIM) 100 MG tablet TAKE ONE TABLET BY MOUTH EVERY DAY  90 tablet  3  . aspirin 81 MG tablet Take 81 mg by mouth every other day.        . clopidogrel (PLAVIX) 75 MG tablet Take 1 tablet (75 mg total) by mouth daily.  90 tablet  3  . colchicine 0.6 MG tablet Take 0.6 mg by mouth 2 (two) times daily as needed.        . furosemide (LASIX) 40 MG tablet As needed       . hydrochlorothiazide (MICROZIDE) 12.5 MG capsule Take 1 capsule (12.5 mg total) by mouth daily.  90 capsule  3  . isosorbide mononitrate (IMDUR) 30 MG 24 hr tablet TAKE ONE TABLET BY MOUTH IN THE MORNING  90 tablet  3  . metoprolol tartrate (LOPRESSOR) 25 MG tablet TAKE ONE-HALF TABLET BY MOUTH TWICE DAILY  90 tablet  3  . nitroGLYCERIN (NITROSTAT) 0.4 MG SL tablet Place 0.4 mg under the tongue every 5 (five) minutes as needed.          Allergies  Allergen Reactions  . Crestor (Rosuvastatin Calcium)     MUSCLE PAIN  .  Lipitor (Atorvastatin Calcium)     MUSCLE PAIN    Past Medical History  Diagnosis Date  . Hypertension   . Gout   . IHD (ischemic heart disease)     Remote MI in 2009 with late presentation; no reperfusion. Has large area of infarct in the apical anterior area on last nuclear in 2011. Managed medically  . Chronic renal insufficiency     creatinine ranges around 2.8  . OA (osteoarthritis) of knee     Left knee with revision and replacement of prosthetic L TKR  . 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 DOE (dyspnea on exertion)   . Herpes zoster 12/2009  . Peripheral edema   . RBBB (right bundle branch block)   . History of BPH     Past Surgical History  Procedure Date  . Cardiac catheterization 03/24/2007  . Total knee arthroplasty   . Cardiovascular stress test 04/02/2009    EF 27% AND A LARGE AREA OF INFARCT IN THE APICAL ANTERIOR WITH MILD PERI-INFARCT ISCHEMIA  . Transthoracic echocardiogram 04/04/2009    EF 45-50%  . Knee surgery 1998  History  Smoking status  . Never Smoker   Smokeless tobacco  . Not on file    History  Alcohol Use No    Family History  Problem Relation Age of Onset  . Coronary artery disease Mother     Review of Systems: The review of systems is positive for recent flare up of gout.  All other systems were reviewed and are negative.  Physical Exam: BP 120/68  Pulse 62  Ht 5\' 4"  (1.626 m)  Wt 167 lb 12.8 oz (76.114 kg)  BMI 28.80 kg/m2 Patient is very pleasant elderly white male and in no acute distress. Skin is warm and dry. Color is normal.  HEENT is unremarkable. Normocephalic/atraumatic. PERRL. Sclera are nonicteric. Neck is supple. No masses. No JVD. Lungs are clear. Cardiac exam shows a regular rate and rhythm. Abdomen is soft. Extremities are with just trace edema. Gait and ROM are intact. No gross neurologic deficits noted.   LABORATORY DATA: BMET is pending.   Assessment / Plan:

## 2010-12-30 NOTE — Assessment & Plan Note (Signed)
No chest pain. Has chronic DOE that is unchanged.

## 2010-12-30 NOTE — Assessment & Plan Note (Addendum)
Blood pressure is better by me. He has had a flare up of his gout which may be exacerbated by the HCTZ. We will see how he does over the next few days. He is already better with the colchicine. We will recheck his labs today. I will have him see Dr. Patty Sermons back in 4 months. Patient is agreeable to this plan and will call if any problems develop in the interim.

## 2010-12-31 ENCOUNTER — Other Ambulatory Visit: Payer: Self-pay | Admitting: *Deleted

## 2010-12-31 ENCOUNTER — Telehealth: Payer: Self-pay | Admitting: *Deleted

## 2010-12-31 MED ORDER — COLCHICINE 0.6 MG PO TABS: 0.6000 mg | ORAL_TABLET | Freq: Two times a day (BID) | ORAL | Status: DC | PRN

## 2010-12-31 NOTE — Telephone Encounter (Signed)
Message copied by Burnell Blanks on Tue Dec 31, 2010 12:06 PM ------      Message from: Rosalio Macadamia      Created: Mon Dec 30, 2010  5:06 PM       Ok to report. Potassium ok. Renal function unchanged.

## 2010-12-31 NOTE — Telephone Encounter (Signed)
Advised of labs 

## 2011-03-11 ENCOUNTER — Telehealth: Payer: Self-pay | Admitting: Cardiology

## 2011-03-11 NOTE — Telephone Encounter (Signed)
New Problem   Patient wife [name] who like a return call from nurse MP, as patient is very dizzy and disoriented when he wakes up during the night to the point of stumbling and falling.  Patient is ok during the day, but  [name] is concerned, call her on hm#

## 2011-03-11 NOTE — Telephone Encounter (Signed)
I suspect that the hydrochlorothiazide may be playing a role in his nocturnal symptoms.  Stop HCTZ and observe response.

## 2011-03-11 NOTE — Telephone Encounter (Signed)
Advised and she will call back with update

## 2011-03-11 NOTE — Telephone Encounter (Signed)
Happens in middle of night and first thing in the morning. Only happens when he first stands up and this morning he even sat on side of bed for a while before he got up, still happened.  She has not checked blood pressure, but does not happen during the day.  Denies any pain meds or sleeping meds.  Only new medication is HCTZ 12.5 mg daily, takes in am.  Will forward to  Dr. Patty Sermons for reveiew

## 2011-03-13 ENCOUNTER — Telehealth: Payer: Self-pay | Admitting: Cardiology

## 2011-03-13 MED ORDER — HYDROCHLOROTHIAZIDE 25 MG PO TABS: ORAL_TABLET | ORAL | Status: DC

## 2011-03-13 NOTE — Telephone Encounter (Signed)
Refilled hctz for 25mg  taking 1/2 daily per pharmacy

## 2011-03-13 NOTE — Telephone Encounter (Signed)
FYI :Pt's dr stopped the HCTZ and it stopped the dizziness wanted to let Juliette Alcide know

## 2011-03-14 NOTE — Telephone Encounter (Signed)
Agree that he should stay off of hydrochlorothiazide.

## 2011-03-14 NOTE — Telephone Encounter (Signed)
Called patient and pharmacy, wife did not get Rx since patient felt better of HCTZ.  Discontinued Rx at pharmacy

## 2011-03-21 ENCOUNTER — Telehealth: Payer: Self-pay | Admitting: Cardiology

## 2011-03-21 NOTE — Telephone Encounter (Signed)
LMTCB Debbie Katalaya Beel RN  

## 2011-03-21 NOTE — Telephone Encounter (Signed)
Wife calls today b/c pt experienced "less than a second of dizziness when sitting up on the bed"  He is out riding on the tractor now. This has occurred only once since stopping the HCTZ.  His bp today is 140/70  HR 60's.  His appetite is good, no edema, no shortness of breath per wife. She wondered if it was his ears. Pt has had no complaints of ear ache. Recommended she call pcp Dr. Ouida Sills. Reassurance given to wife.  She will call pcp. Mylo Red RN

## 2011-03-21 NOTE — Telephone Encounter (Signed)
Patient wife Huntley Dec [phone]  Please return call to wife as patient experiencing dizziness over the last couple of nights, tends to go vier off to the left when walking.  She can be reached at hm# [phone] for return call.

## 2011-03-23 NOTE — Telephone Encounter (Signed)
Agree with advice given

## 2011-05-05 ENCOUNTER — Encounter: Payer: Self-pay | Admitting: Cardiology

## 2011-05-05 ENCOUNTER — Ambulatory Visit (INDEPENDENT_AMBULATORY_CARE_PROVIDER_SITE_OTHER): Payer: Medicare Other | Admitting: Cardiology

## 2011-05-05 VITALS — BP 120/84 | HR 60 | Ht 64.0 in | Wt 165.0 lb

## 2011-05-05 DIAGNOSIS — N189 Chronic kidney disease, unspecified: Secondary | ICD-10-CM

## 2011-05-05 DIAGNOSIS — I119 Hypertensive heart disease without heart failure: Secondary | ICD-10-CM

## 2011-05-05 DIAGNOSIS — I259 Chronic ischemic heart disease, unspecified: Secondary | ICD-10-CM

## 2011-05-05 DIAGNOSIS — R05 Cough: Secondary | ICD-10-CM | POA: Insufficient documentation

## 2011-05-05 DIAGNOSIS — N289 Disorder of kidney and ureter, unspecified: Secondary | ICD-10-CM

## 2011-05-05 DIAGNOSIS — I2510 Atherosclerotic heart disease of native coronary artery without angina pectoris: Secondary | ICD-10-CM

## 2011-05-05 DIAGNOSIS — J40 Bronchitis, not specified as acute or chronic: Secondary | ICD-10-CM

## 2011-05-05 DIAGNOSIS — R059 Cough, unspecified: Secondary | ICD-10-CM

## 2011-05-05 MED ORDER — AZITHROMYCIN 250 MG PO TABS: ORAL_TABLET | ORAL | Status: AC

## 2011-05-05 NOTE — Patient Instructions (Signed)
Z-Pak as directed.  Your physician wants you to follow-up in: 4 months with Dr. Patty Sermons. You will receive a reminder letter in the mail two months in advance. If you don't receive a letter, please call our office to schedule the follow-up appointment.  Your physician recommends that you return for fasting lab work in: 4 months

## 2011-05-05 NOTE — Progress Notes (Signed)
[name] Date of Birth:  06-Nov-1928 [facility] [address] [place], [place]  [address] [phone]  Fax   [phone]  HPI: This pleasant 78 year old gentleman is seen for a month followup office visit.  He has a past history of the large anterior wall myocardial infarction with late presentation.  As a history of high blood pressure and chronic kidney disease.  His last ejection fraction by echo in March 2011 was 45-50%.  His last nuclear stress test in March 2011 showed a large area of infarct with only minimal peri-infarct ischemia and an ejection fraction of 27%.  He is managed medically.  He has not been experiencing any chest pain but has had some exertional dyspnea which is chronic.  Also had a nonproductive cough and some symptoms of bronchitis.  Current Outpatient Prescriptions  Medication Sig Dispense Refill  . albuterol (PROAIR HFA) 108 (90 BASE) MCG/ACT inhaler Inhale 2 puffs into the lungs every 6 (six) hours as needed for wheezing.  6.7 g  1  . allopurinol (ZYLOPRIM) 100 MG tablet TAKE ONE TABLET BY MOUTH EVERY DAY  90 tablet  3  . aspirin 81 MG tablet Take 81 mg by mouth every other day.        . clopidogrel (PLAVIX) 75 MG tablet Take 1 tablet (75 mg total) by mouth daily.  90 tablet  3  . colchicine 0.6 MG tablet Take 1 tablet (0.6 mg total) by mouth 2 (two) times daily as needed.  60 tablet  3  . furosemide (LASIX) 40 MG tablet As needed       . isosorbide mononitrate (IMDUR) 30 MG 24 hr tablet TAKE ONE TABLET BY MOUTH IN THE MORNING  90 tablet  3  . metoprolol tartrate (LOPRESSOR) 25 MG tablet TAKE ONE-HALF TABLET BY MOUTH TWICE DAILY  90 tablet  3  . nitroGLYCERIN (NITROSTAT) 0.4 MG SL tablet Place 0.4 mg under the tongue every 5 (five) minutes as needed.        Marland Kitchen azithromycin (ZITHROMAX) 250 MG tablet Take 2 tablets (500 mg) on  Day 1,  followed by 1 tablet (250 mg) once daily on Days 2 through 5.  6 each  0    Allergies  Allergen  Reactions  . Crestor (Rosuvastatin Calcium)     MUSCLE PAIN  . Hctz (Hydrochlorothiazide)     dizziness  . Lipitor (Atorvastatin Calcium)     MUSCLE PAIN    Patient Active Problem List  Diagnoses  . Osteoarthritis  . Ischemic heart disease  . Right bundle branch block  . Chronic renal insufficiency  . Benign hypertensive heart disease without heart failure    History  Smoking status  . Never Smoker   Smokeless tobacco  . Not on file    History  Alcohol Use No    Family History  Problem Relation Age of Onset  . Coronary artery disease Mother     Review of Systems: The patient denies any heat or cold intolerance.  No weight gain or weight loss.  The patient denies headaches or blurry vision.  There is no cough or sputum production.  The patient denies dizziness.  There is no hematuria or hematochezia.  The patient denies any muscle aches or arthritis.  The patient denies any rash.  The patient denies frequent falling or instability.  There is no history of depression or anxiety.  All other systems were reviewed and are negative.   Physical Exam: Filed Vitals:  05/05/11 0915  BP: 120/84  Pulse: 60   the general appearance reveals an elderly gentleman in no acute distress.Pupils equal and reactive.   Extraocular Movements are full.  There is no scleral icterus.  The mouth and pharynx are normal.  The neck is supple.  The carotids reveal no bruits.  The jugular venous pressure is normal.  The thyroid is not enlarged.  There is no lymphadenopathy.  The chest is clear to percussion and auscultation. There are no rales or rhonchi. Expansion of the chest is symmetrical.  The heart reveals a soft systolic ejection murmur at the base.The abdomen is soft and nontender. Bowel sounds are normal. The liver and spleen are not enlarged. There Are no abdominal masses. There are no bruits.  The pedal pulses are good.  There is no phlebitis or edema.  There is no cyanosis or  clubbing. The skin is warm and dry.  There is no rash.     Assessment / Plan: Continue current cardiac meds.  We will treat his bronchitis.  Recheck in 4 months for followup office visit and fasting lipid panel hepatic function panel and basal metabolic panel

## 2011-05-05 NOTE — Assessment & Plan Note (Signed)
The patient has had a primarily nonproductive cough or duct of minimal amounts of yellowish sputum.  No chills or fever.  He will resume taking Mucinex and we will call him in a Z-Pak

## 2011-05-05 NOTE — Assessment & Plan Note (Signed)
The patient has a history of chronic renal insufficiency.  He is not at this time followed by nephrology.  He does have nocturia x2.  He has not had any fever or dysuria.

## 2011-05-05 NOTE — Assessment & Plan Note (Signed)
The patient denies any recent chest pain or angina.  He will continue current cardiac meds

## 2011-05-26 ENCOUNTER — Other Ambulatory Visit: Payer: Self-pay | Admitting: Cardiology

## 2011-05-26 NOTE — Telephone Encounter (Signed)
Refilled metoprolol 

## 2011-06-17 ENCOUNTER — Other Ambulatory Visit (HOSPITAL_COMMUNITY): Payer: Self-pay | Admitting: Dermatology

## 2011-06-17 ENCOUNTER — Ambulatory Visit (HOSPITAL_COMMUNITY)
Admission: RE | Admit: 2011-06-17 | Discharge: 2011-06-17 | Disposition: A | Discharge status: Home / Self Care | Payer: Medicare Other | Source: Ambulatory Visit | Attending: Dermatology | Admitting: Dermatology

## 2011-06-17 DIAGNOSIS — W19XXXA Unspecified fall, initial encounter: Secondary | ICD-10-CM | POA: Insufficient documentation

## 2011-06-17 DIAGNOSIS — S8990XA Unspecified injury of unspecified lower leg, initial encounter: Secondary | ICD-10-CM | POA: Insufficient documentation

## 2011-06-17 DIAGNOSIS — S99919A Unspecified injury of unspecified ankle, initial encounter: Secondary | ICD-10-CM | POA: Insufficient documentation

## 2011-06-17 DIAGNOSIS — M25579 Pain in unspecified ankle and joints of unspecified foot: Secondary | ICD-10-CM | POA: Insufficient documentation

## 2011-07-02 ENCOUNTER — Other Ambulatory Visit: Payer: Self-pay | Admitting: Cardiology

## 2011-07-18 ENCOUNTER — Telehealth: Payer: Self-pay | Admitting: Cardiology

## 2011-07-18 MED ORDER — DOXYCYCLINE HYCLATE 100 MG PO TABS: 100.0000 mg | ORAL_TABLET | Freq: Two times a day (BID) | ORAL | Status: AC

## 2011-07-18 NOTE — Telephone Encounter (Signed)
Advised wife and called to pharmacy 

## 2011-07-18 NOTE — Telephone Encounter (Signed)
Please return call to patient wife Huntley Dec [phone].    Ms. Huntley Dec pulled a deer tick off of patient last night and is concerned as the sight has swollen 4x the original size.  Please return call to advise.

## 2011-07-18 NOTE — Telephone Encounter (Signed)
Start doxycycline 100mg twice a day for 7 days.

## 2011-07-18 NOTE — Telephone Encounter (Signed)
Sure she got the head out.  Concerned and wonders if he needs Doxycycline.  States just red and swollen.  Will forward to  Dr. Patty Sermons for review

## 2011-08-28 ENCOUNTER — Other Ambulatory Visit: Payer: Self-pay | Admitting: *Deleted

## 2011-08-28 DIAGNOSIS — I519 Heart disease, unspecified: Secondary | ICD-10-CM

## 2011-08-28 MED ORDER — CLOPIDOGREL BISULFATE 75 MG PO TABS: 75.0000 mg | ORAL_TABLET | Freq: Every day | ORAL | Status: DC

## 2011-08-28 NOTE — Telephone Encounter (Signed)
Refilled clopidogrel.

## 2011-09-02 ENCOUNTER — Other Ambulatory Visit: Payer: Self-pay | Admitting: *Deleted

## 2011-09-02 ENCOUNTER — Other Ambulatory Visit: Payer: Self-pay

## 2011-09-02 MED ORDER — ISOSORBIDE MONONITRATE ER 30 MG PO TB24: 30.0000 mg | ORAL_TABLET | Freq: Every day | ORAL | Status: DC

## 2011-09-02 NOTE — Telephone Encounter (Signed)
..   Requested Prescriptions   Signed Prescriptions Disp Refills  . isosorbide mononitrate (IMDUR) 30 MG 24 hr tablet 30 tablet 6    Sig: Take 1 tablet (30 mg total) by mouth daily.    Authorizing Provider: [name]    Ordering User: Christella Hartigan, Harrol Novello Judie Petit

## 2011-09-02 NOTE — Telephone Encounter (Signed)
Fax Received. Refill Completed. [name] (R.M.A)   

## 2011-09-04 ENCOUNTER — Ambulatory Visit: Payer: Medicare Other | Admitting: Cardiology

## 2011-09-04 ENCOUNTER — Other Ambulatory Visit: Payer: Medicare Other

## 2011-09-09 ENCOUNTER — Encounter: Payer: Self-pay | Admitting: *Deleted

## 2011-09-10 ENCOUNTER — Ambulatory Visit (INDEPENDENT_AMBULATORY_CARE_PROVIDER_SITE_OTHER): Payer: Medicare Other | Admitting: Cardiology

## 2011-09-10 ENCOUNTER — Other Ambulatory Visit (INDEPENDENT_AMBULATORY_CARE_PROVIDER_SITE_OTHER): Payer: Medicare Other

## 2011-09-10 ENCOUNTER — Ambulatory Visit
Admission: RE | Admit: 2011-09-10 | Discharge: 2011-09-10 | Disposition: A | Discharge status: Home / Self Care | Payer: Medicare Other | Source: Ambulatory Visit | Attending: Cardiology | Admitting: Cardiology

## 2011-09-10 ENCOUNTER — Encounter: Payer: Self-pay | Admitting: Cardiology

## 2011-09-10 VITALS — BP 140/90 | HR 64 | Ht 64.0 in | Wt 163.0 lb

## 2011-09-10 DIAGNOSIS — I4510 Unspecified right bundle-branch block: Secondary | ICD-10-CM

## 2011-09-10 DIAGNOSIS — I259 Chronic ischemic heart disease, unspecified: Secondary | ICD-10-CM

## 2011-09-10 DIAGNOSIS — I119 Hypertensive heart disease without heart failure: Secondary | ICD-10-CM

## 2011-09-10 DIAGNOSIS — R05 Cough: Secondary | ICD-10-CM

## 2011-09-10 DIAGNOSIS — R059 Cough, unspecified: Secondary | ICD-10-CM

## 2011-09-10 DIAGNOSIS — N189 Chronic kidney disease, unspecified: Secondary | ICD-10-CM

## 2011-09-10 DIAGNOSIS — I2510 Atherosclerotic heart disease of native coronary artery without angina pectoris: Secondary | ICD-10-CM

## 2011-09-10 DIAGNOSIS — M109 Gout, unspecified: Secondary | ICD-10-CM

## 2011-09-10 DIAGNOSIS — R0989 Other specified symptoms and signs involving the circulatory and respiratory systems: Secondary | ICD-10-CM

## 2011-09-10 DIAGNOSIS — J40 Bronchitis, not specified as acute or chronic: Secondary | ICD-10-CM

## 2011-09-10 LAB — HEPATIC FUNCTION PANEL
ALT: 15 U/L (ref 0–53)
Total Protein: 6.9 g/dL (ref 6.0–8.3)

## 2011-09-10 LAB — BASIC METABOLIC PANEL
GFR: 21.06 mL/min — ABNORMAL LOW (ref >60.00)
Potassium: 4.5 mEq/L (ref 3.5–5.1)
Sodium: 140 mEq/L (ref 135–145)

## 2011-09-10 LAB — LIPID PANEL
Cholesterol: 164 mg/dL (ref 0–200)
HDL: 49 mg/dL (ref >39.00)
LDL Cholesterol: 103 mg/dL — ABNORMAL HIGH (ref 0–99)
VLDL: 11.6 mg/dL (ref 0.0–40.0)

## 2011-09-10 NOTE — Assessment & Plan Note (Signed)
Patient has had no symptoms referable to his right bundle branch block.  No dizziness or syncope

## 2011-09-10 NOTE — Patient Instructions (Addendum)
Will obtain labs today and call you with the results (bmet/hfp/lp)  Will have you go for a chest and call you with the result  Your physician recommends that you continue on your current medications as directed. Please refer to the Current Medication list given to you today.  Your physician recommends that you schedule a follow-up appointment in: 4 months with fasting labs (lp/bmet/hfp/uric acid)

## 2011-09-10 NOTE — Assessment & Plan Note (Signed)
Patient has a history of chronic renal insufficiency.  He is not having any symptoms of uremia

## 2011-09-10 NOTE — Assessment & Plan Note (Signed)
The patient has not been experiencing any recurrent chest pain or angina.  He has a history of a remote large anterior wall myocardial infarction which presented late and he has not had catheterization or intervention

## 2011-09-10 NOTE — Progress Notes (Signed)
[name] Date of Birth:  10/03/1928 [facility] [address] [place], [place]  [address] [phone]         Fax   [phone]  History of Present Illness: This pleasant 78 year old gentleman is seen for a month followup office visit. He has a past history of the large anterior wall myocardial infarction with late presentation. As a history of high blood pressure and chronic kidney disease. His last ejection fraction by echo in March 2011 was 45-50%. His last nuclear stress test in March 2011 showed a large area of infarct with only minimal peri-infarct ischemia and an ejection fraction of 27%. He is managed medically. He has not been experiencing any chest pain but has had some exertional dyspnea which is chronic. Also had a nonproductive cough and some symptoms of bronchitis. He has continued to have a dry hacking cough.  He sleeps on one pillow and has not been experiencing any peripheral edema increase over his usual mild edema.  His last chest x-ray was on 08/27/10   Current Outpatient Prescriptions  Medication Sig Dispense Refill  . albuterol (PROAIR HFA) 108 (90 BASE) MCG/ACT inhaler Inhale 2 puffs into the lungs every 6 (six) hours as needed for wheezing.  6.7 g  1  . allopurinol (ZYLOPRIM) 100 MG tablet TAKE ONE TABLET BY MOUTH EVERY DAY  30 tablet  2  . aspirin 81 MG tablet Take 81 mg by mouth every other day.        . clopidogrel (PLAVIX) 75 MG tablet Take 1 tablet (75 mg total) by mouth daily.  90 tablet  3  . colchicine 0.6 MG tablet Take 1 tablet (0.6 mg total) by mouth 2 (two) times daily as needed.  60 tablet  3  . furosemide (LASIX) 40 MG tablet As needed       . isosorbide mononitrate (IMDUR) 30 MG 24 hr tablet Take 1 tablet (30 mg total) by mouth daily.  90 tablet  3  . metoprolol tartrate (LOPRESSOR) 25 MG tablet TAKE ONE-HALF TABLET BY MOUTH TWICE DAILY  90 tablet  3  . nitroGLYCERIN (NITROSTAT) 0.4 MG SL tablet Place 0.4 mg under the tongue  every 5 (five) minutes as needed.          Allergies  Allergen Reactions  . Crestor (Rosuvastatin Calcium)     MUSCLE PAIN  . Hctz (Hydrochlorothiazide)     dizziness  . Lipitor (Atorvastatin Calcium)     MUSCLE PAIN    Patient Active Problem List  Diagnosis  . Osteoarthritis  . Ischemic heart disease  . Right bundle branch block  . Chronic renal insufficiency  . Benign hypertensive heart disease without heart failure  . Cough    History  Smoking status  . Never Smoker   Smokeless tobacco  . Not on file    History  Alcohol Use No    Family History  Problem Relation Age of Onset  . Coronary artery disease Mother     Review of Systems: Constitutional: no fever chills diaphoresis or fatigue or change in weight.  Head and neck: no hearing loss, no epistaxis, no photophobia or visual disturbance. Respiratory: No cough, shortness of breath or wheezing. Cardiovascular: No chest pain peripheral edema, palpitations. Gastrointestinal: No abdominal distention, no abdominal pain, no change in bowel habits hematochezia or melena. Genitourinary: No dysuria, no frequency, no urgency, no nocturia. Musculoskeletal:No arthralgias, no back pain, no gait disturbance or myalgias. Neurological: No dizziness, no headaches, no numbness,  no seizures, no syncope, no weakness, no tremors. Hematologic: No lymphadenopathy, no easy bruising. Psychiatric: No confusion, no hallucinations, no sleep disturbance.    Physical Exam: Filed Vitals:   09/10/11 0938  BP: 140/90  Pulse: 64   the general appearance reveals a elderly gentleman in no acute distress.The head and neck exam reveals pupils equal and reactive.  Extraocular movements are full.  There is no scleral icterus.  The mouth and pharynx are normal.  The neck is supple.  The carotids reveal no bruits.  The jugular venous pressure is normal.  The  thyroid is not enlarged.  There is no lymphadenopathy.  The chest is clear to percussion  and auscultation.  There are no rales or rhonchi.  Expansion of the chest is symmetrical.  The precordium is quiet.  The first heart sound is normal.  The second heart sound is physiologically split.  There is no murmur gallop rub or click.  There is no abnormal lift or heave.  The abdomen is soft and nontender.  The bowel sounds are normal.  The liver and spleen are not enlarged.  There are no abdominal masses.  There are no abdominal bruits.  Extremities reveal good pedal pulses.  There is 1+ bilateral edema worse on the right than in the left foot. There is no cyanosis or clubbing.  Strength is normal and symmetrical in all extremities.  There is no lateralizing weakness.  There are no sensory deficits.  The skin is warm and dry.  There is no rash.     Assessment / Plan: We are getting a chest x-ray today.  The result was coming back and shows normal heart size and clear lungs and is unremarkable and is unchanged from last year.  The patient will continue same medication and be rechecked in 4 months for followup office visit EKG and fasting lab work and uric acid

## 2011-09-11 ENCOUNTER — Telehealth: Payer: Self-pay | Admitting: *Deleted

## 2011-09-11 NOTE — Telephone Encounter (Signed)
Message copied by Burnell Blanks on Thu Sep 11, 2011  8:13 AM ------      Message from: [name]      Created: Wed Sep 10, 2011  8:37 PM       Lipids good. Creatinine slightly higher 3.1.  Try to drink more water. CSD.  Liver okay

## 2011-09-11 NOTE — Telephone Encounter (Signed)
Message copied by Burnell Blanks on Thu Sep 11, 2011  8:13 AM ------      Message from: [name]      Created: Wed Sep 10, 2011 12:29 PM       Please report.  The chest x-ray is unremarkable.  The heart size is normal and there is no evidence of pneumonia or congestive heart failure or other abnormality.

## 2011-09-11 NOTE — Telephone Encounter (Signed)
Advised wife of labs and xray results

## 2011-10-15 ENCOUNTER — Other Ambulatory Visit: Payer: Self-pay | Admitting: *Deleted

## 2011-10-15 MED ORDER — ALLOPURINOL 100 MG PO TABS: 100.0000 mg | ORAL_TABLET | Freq: Every day | ORAL | Status: DC

## 2011-11-24 DIAGNOSIS — N400 Enlarged prostate without lower urinary tract symptoms: Secondary | ICD-10-CM | POA: Insufficient documentation

## 2011-11-26 ENCOUNTER — Telehealth: Payer: Self-pay | Admitting: Cardiology

## 2011-11-26 MED ORDER — AMLODIPINE BESYLATE 5 MG PO TABS: 5.0000 mg | ORAL_TABLET | Freq: Every day | ORAL | Status: DC

## 2011-11-26 NOTE — Telephone Encounter (Signed)
New Problem:    Patient's wife called in because her husbands BP was 175/95 and she would like to know how to proceed.  Please call back.

## 2011-11-26 NOTE — Telephone Encounter (Signed)
Patient has had elevated blood pressure all week, even at MD's office earlier this week.  Discussed with  Dr. Patty Sermons and will have the patient add Amlodipine 5 mg daily.  Advised wife, she will call back next week with updated blood pressure readings.

## 2011-11-26 NOTE — Telephone Encounter (Signed)
Left message to call back  

## 2011-12-23 ENCOUNTER — Ambulatory Visit (INDEPENDENT_AMBULATORY_CARE_PROVIDER_SITE_OTHER): Payer: Medicare Other | Admitting: Cardiology

## 2011-12-23 ENCOUNTER — Encounter: Payer: Self-pay | Admitting: Cardiology

## 2011-12-23 ENCOUNTER — Other Ambulatory Visit (INDEPENDENT_AMBULATORY_CARE_PROVIDER_SITE_OTHER): Payer: Medicare Other

## 2011-12-23 VITALS — BP 163/82 | HR 59 | Ht 64.0 in | Wt 165.8 lb

## 2011-12-23 DIAGNOSIS — I259 Chronic ischemic heart disease, unspecified: Secondary | ICD-10-CM

## 2011-12-23 DIAGNOSIS — I4510 Unspecified right bundle-branch block: Secondary | ICD-10-CM

## 2011-12-23 DIAGNOSIS — I119 Hypertensive heart disease without heart failure: Secondary | ICD-10-CM

## 2011-12-23 DIAGNOSIS — M109 Gout, unspecified: Secondary | ICD-10-CM

## 2011-12-23 LAB — BASIC METABOLIC PANEL
Chloride: 105 mEq/L (ref 96–112)
Creatinine, Ser: 3.1 mg/dL — ABNORMAL HIGH (ref 0.4–1.5)

## 2011-12-23 LAB — HEPATIC FUNCTION PANEL
Albumin: 4.1 g/dL (ref 3.5–5.2)
Alkaline Phosphatase: 108 U/L (ref 39–117)
Total Protein: 6.8 g/dL (ref 6.0–8.3)

## 2011-12-23 LAB — URIC ACID: Uric Acid, Serum: 5.3 mg/dL (ref 4.0–7.8)

## 2011-12-23 LAB — LIPID PANEL
HDL: 46 mg/dL (ref >39.00)
LDL Cholesterol: 91 mg/dL (ref 0–99)

## 2011-12-23 MED ORDER — AMLODIPINE BESYLATE 10 MG PO TABS: 10.0000 mg | ORAL_TABLET | Freq: Every day | ORAL | Status: DC

## 2011-12-23 NOTE — Assessment & Plan Note (Signed)
The patient has not been experiencing any recent chest pain or angina 

## 2011-12-23 NOTE — Assessment & Plan Note (Signed)
The patient has had no dizzy spells or syncope related to his right bundle branch block

## 2011-12-23 NOTE — Patient Instructions (Addendum)
INCREASE YOUR AMLODIPINE TO 10 MG DAILY  Your physician wants you to follow-up in: 4 months with fasting labs (lp/bmet/hfp) You will receive a reminder letter in the mail two months in advance. If you don't receive a letter, please call our office to schedule the follow-up appointment.   Will obtain labs today and call you with the results (lp/bmet/hfp/uric acid)

## 2011-12-23 NOTE — Assessment & Plan Note (Signed)
His blood pressure has been running high.  He has been on amlodipine 5 mg daily which has improved his blood pressure somewhat.  We will increase his amlodipine up to 10 mg daily

## 2011-12-23 NOTE — Progress Notes (Signed)
[name] Date of Birth:  1929-01-12 [facility] [address] [place], [place]  [address] [phone]         Fax   [phone]  History of Present Illness: This pleasant 78 year old gentleman is seen for a month followup office visit. He has a past history of the large anterior wall myocardial infarction with late presentation. As a history of high blood pressure and chronic kidney disease. His last ejection fraction by echo in March 2011 was 45-50%. His last nuclear stress test in March 2011 showed a large area of infarct with only minimal peri-infarct ischemia and an ejection fraction of 27%. He is managed medically. He has not been experiencing any chest pain but has had some exertional dyspnea which is chronic. Also had a nonproductive cough and some symptoms of bronchitis.  Recently he has been experiencing more difficulty ambulating.  He has seen Dr. Channing Mutters, neurosurgeon, and the patient has a cervical MRI scheduled for Friday of this week.   Current Outpatient Prescriptions  Medication Sig Dispense Refill  . allopurinol (ZYLOPRIM) 100 MG tablet Take 1 tablet (100 mg total) by mouth daily.  30 tablet  9  . amLODipine (NORVASC) 10 MG tablet Take 1 tablet (10 mg total) by mouth daily.  90 tablet  3  . aspirin 81 MG tablet Take 81 mg by mouth every other day.        . clopidogrel (PLAVIX) 75 MG tablet Take 1 tablet (75 mg total) by mouth daily.  90 tablet  3  . colchicine 0.6 MG tablet Take 1 tablet (0.6 mg total) by mouth 2 (two) times daily as needed.  60 tablet  3  . furosemide (LASIX) 40 MG tablet As needed       . isosorbide mononitrate (IMDUR) 30 MG 24 hr tablet Take 1 tablet (30 mg total) by mouth daily.  90 tablet  3  . metoprolol tartrate (LOPRESSOR) 25 MG tablet TAKE ONE-HALF TABLET BY MOUTH TWICE DAILY  90 tablet  3  . nitroGLYCERIN (NITROSTAT) 0.4 MG SL tablet Place 0.4 mg under the tongue every 5 (five) minutes as needed.        . [DISCONTINUED]  amLODipine (NORVASC) 5 MG tablet Take 1 tablet (5 mg total) by mouth daily.  30 tablet  5  . albuterol (PROAIR HFA) 108 (90 BASE) MCG/ACT inhaler Inhale 2 puffs into the lungs every 6 (six) hours as needed for wheezing.  6.7 g  1    Allergies  Allergen Reactions  . Crestor (Rosuvastatin Calcium)     MUSCLE PAIN  . Hctz (Hydrochlorothiazide)     dizziness  . Lipitor (Atorvastatin Calcium)     MUSCLE PAIN    Patient Active Problem List  Diagnosis  . Osteoarthritis  . Ischemic heart disease  . Right bundle branch block  . Chronic renal insufficiency  . Benign hypertensive heart disease without heart failure  . Cough    History  Smoking status  . Never Smoker   Smokeless tobacco  . Not on file    History  Alcohol Use No    Family History  Problem Relation Age of Onset  . Coronary artery disease Mother     Review of Systems: Constitutional: no fever chills diaphoresis or fatigue or change in weight.  Head and neck: no hearing loss, no epistaxis, no photophobia or visual disturbance. Respiratory: No cough, shortness of breath or wheezing. Cardiovascular: No chest pain peripheral edema, palpitations. Gastrointestinal: No abdominal distention,  no abdominal pain, no change in bowel habits hematochezia or melena. Genitourinary: No dysuria, no frequency, no urgency, no nocturia. Musculoskeletal:No arthralgias, no back pain, no gait disturbance or myalgias. Neurological: No dizziness, no headaches, no numbness, no seizures, no syncope, no weakness, no tremors. Hematologic: No lymphadenopathy, no easy bruising. Psychiatric: No confusion, no hallucinations, no sleep disturbance.    Physical Exam: Filed Vitals:   12/23/11 1046  BP: 163/82  Pulse: 59   the general appearance reveals a well-developed well-nourished alert gentleman in no distress.The head and neck exam reveals pupils equal and reactive.  Extraocular movements are full.  There is no scleral icterus.  The mouth  and pharynx are normal.  The neck is supple.  The carotids reveal no bruits.  The jugular venous pressure is normal.  The  thyroid is not enlarged.  There is no lymphadenopathy.  The chest is clear to percussion and auscultation.  There are no rales or rhonchi.  Expansion of the chest is symmetrical.  The precordium is quiet.  The first heart sound is normal.  The second heart sound is physiologically split.  There is no murmur gallop rub or click.  There is no abnormal lift or heave.  The abdomen is soft and nontender.  The bowel sounds are normal.  The liver and spleen are not enlarged.  There are no abdominal masses.  There are no abdominal bruits.  Extremities reveal good pedal pulses.  There is no phlebitis or edema.  There is no cyanosis or clubbing.  Strength is normal and symmetrical in all extremities.  There is no lateralizing weakness.  There are no sensory deficits.  The skin is warm and dry.  There is no rash.  EKG shows sinus bradycardia and bifascicular block and no ischemic changes   Assessment / Plan: Continue same medication except increase amlodipine to 10 mg daily.  Recheck in 4 months for followup office visit and fasting lipid panel hepatic function panel and basal metabolic panel.

## 2011-12-30 ENCOUNTER — Other Ambulatory Visit: Payer: Medicare Other

## 2011-12-30 ENCOUNTER — Ambulatory Visit: Payer: Medicare Other | Admitting: Cardiology

## 2012-01-05 ENCOUNTER — Telehealth: Payer: Self-pay | Admitting: *Deleted

## 2012-01-06 ENCOUNTER — Other Ambulatory Visit: Payer: Self-pay | Admitting: Cardiology

## 2012-01-06 MED ORDER — ALLOPURINOL 100 MG PO TABS: 100.0000 mg | ORAL_TABLET | Freq: Every day | ORAL | Status: DC

## 2012-01-06 NOTE — Telephone Encounter (Signed)
Advised wife patient of lab results

## 2012-03-12 ENCOUNTER — Telehealth: Payer: Self-pay | Admitting: *Deleted

## 2012-03-12 NOTE — Telephone Encounter (Signed)
I will keep [name] on her amlodipine. The alternative medicines would be likely more difficult on her lung function.  In regard to [name] I would agree with reducing the dose down to just 5 mg and putting up with a slight amount of swelling.   Advised wife

## 2012-03-12 NOTE — Telephone Encounter (Signed)
-----   Message from Starr Sinclair to [name], MD sent at 03/11/2012 3:33 PM ----- Juliette Alcide, I went to dr. Sherene Sires today. He think my norvasc should be changed. Also Ray was taking 10mg  ,Dr. Margo Aye decreased his to 5mg  because his feet and legs were swollen with 3 to 4 pitting edema. And his creating was 3.2 . He has been on 5mg  for 2 weeks and he still has some swelling. Let me know what he thinks. [phone]. Thanks Huntley Dec    Per wife swelling still there. Did take lasix for few days and swelling did improve some but didn't go away.  Will forward to  Dr. Patty Sermons for review

## 2012-04-20 ENCOUNTER — Encounter: Payer: Self-pay | Admitting: Cardiology

## 2012-04-20 ENCOUNTER — Telehealth: Payer: Self-pay | Admitting: *Deleted

## 2012-04-20 ENCOUNTER — Ambulatory Visit (INDEPENDENT_AMBULATORY_CARE_PROVIDER_SITE_OTHER): Payer: Medicare Other | Admitting: Cardiology

## 2012-04-20 VITALS — BP 140/78 | HR 67 | Ht 63.0 in | Wt 164.8 lb

## 2012-04-20 DIAGNOSIS — I119 Hypertensive heart disease without heart failure: Secondary | ICD-10-CM

## 2012-04-20 DIAGNOSIS — I259 Chronic ischemic heart disease, unspecified: Secondary | ICD-10-CM

## 2012-04-20 DIAGNOSIS — N189 Chronic kidney disease, unspecified: Secondary | ICD-10-CM

## 2012-04-20 LAB — HEPATIC FUNCTION PANEL
ALT: 19 U/L (ref 0–53)
AST: 21 U/L (ref 0–37)
Albumin: 4 g/dL (ref 3.5–5.2)
Alkaline Phosphatase: 104 U/L (ref 39–117)
Bilirubin, Direct: 0.1 mg/dL (ref 0.0–0.3)
Total Protein: 6.8 g/dL (ref 6.0–8.3)

## 2012-04-20 LAB — BASIC METABOLIC PANEL
BUN: 63 mg/dL — ABNORMAL HIGH (ref 6–23)
Calcium: 9.3 mg/dL (ref 8.4–10.5)
Creatinine, Ser: 3.2 mg/dL — ABNORMAL HIGH (ref 0.4–1.5)
GFR: 19.97 mL/min — ABNORMAL LOW (ref >60.00)

## 2012-04-20 LAB — LIPID PANEL
Total CHOL/HDL Ratio: 3
VLDL: 13.6 mg/dL (ref 0.0–40.0)

## 2012-04-20 MED ORDER — HYDRALAZINE HCL 25 MG PO TABS: 25.0000 mg | ORAL_TABLET | Freq: Three times a day (TID) | ORAL | Status: DC

## 2012-04-20 NOTE — Telephone Encounter (Signed)
Yes we discussed at OV he was taking hydralazine, not hydroxyzine.We increased his hydralazine from BID to TID.

## 2012-04-20 NOTE — Telephone Encounter (Signed)
Discussed with wife and corrected on medication list

## 2012-04-20 NOTE — Progress Notes (Signed)
[name] Date of Birth:  1928-06-26 [facility] [address] [place], [place]  [address] [phone]         Fax   [phone]  History of Present Illness: This pleasant 79 year old gentleman is seen for a month followup office visit. He has a past history of the large anterior wall myocardial infarction with late presentation. As a history of high blood pressure and chronic kidney disease. His last ejection fraction by echo in March 2011 was 45-50%. His last nuclear stress test in March 2011 showed a large area of infarct with only minimal peri-infarct ischemia and an ejection fraction of 27%. He is managed medically. He has not been experiencing any chest pain but has had some exertional dyspnea which is chronic.  He recently developed worsening peripheral edema after we increased his amlodipine from 5 mg up to 10 mg daily.  His edema has improved since his PCP Dr. Catalina Pizza decreased tenderness amlodipine back to 5 mg daily and added hydralazine 25 mg twice a day.    Current Outpatient Prescriptions  Medication Sig Dispense Refill  . allopurinol (ZYLOPRIM) 100 MG tablet Take 1 tablet (100 mg total) by mouth daily.  90 tablet  3  . aspirin 81 MG tablet Take 81 mg by mouth every other day.        . clopidogrel (PLAVIX) 75 MG tablet Take 1 tablet (75 mg total) by mouth daily.  90 tablet  3  . colchicine 0.6 MG tablet Take 1 tablet (0.6 mg total) by mouth 2 (two) times daily as needed.  60 tablet  3  . furosemide (LASIX) 40 MG tablet As needed       . metoprolol tartrate (LOPRESSOR) 25 MG tablet TAKE ONE-HALF TABLET BY MOUTH TWICE DAILY  90 tablet  3  . nitroGLYCERIN (NITROSTAT) 0.4 MG SL tablet Place 0.4 mg under the tongue every 5 (five) minutes as needed.        Marland Kitchen albuterol (PROAIR HFA) 108 (90 BASE) MCG/ACT inhaler Inhale 2 puffs into the lungs every 6 (six) hours as needed for wheezing.  6.7 g  1  . hydrALAZINE (APRESOLINE) 25 MG tablet Take 1 tablet (25 mg  total) by mouth 3 (three) times daily.  270 tablet  3  . hydrOXYzine (ATARAX/VISTARIL) 25 MG tablet Take 25 mg by mouth 2 (two) times daily.       No current facility-administered medications for this visit.    Allergies  Allergen Reactions  . Crestor (Rosuvastatin Calcium)     MUSCLE PAIN  . Hctz (Hydrochlorothiazide)     dizziness  . Lipitor (Atorvastatin Calcium)     MUSCLE PAIN    Patient Active Problem List  Diagnosis  . Osteoarthritis  . Ischemic heart disease  . Right bundle branch block  . Chronic renal insufficiency  . Benign hypertensive heart disease without heart failure  . Cough    History  Smoking status  . Never Smoker   Smokeless tobacco  . Not on file    History  Alcohol Use No    Family History  Problem Relation Age of Onset  . Coronary artery disease Mother     Review of Systems: Constitutional: no fever chills diaphoresis or fatigue or change in weight.  Head and neck: no hearing loss, no epistaxis, no photophobia or visual disturbance. Respiratory: No cough, shortness of breath or wheezing. Cardiovascular: No chest pain peripheral edema, palpitations. Gastrointestinal: No abdominal distention, no abdominal pain, no change  in bowel habits hematochezia or melena. Genitourinary: No dysuria, no frequency, no urgency, no nocturia. Musculoskeletal:No arthralgias, no back pain, no gait disturbance or myalgias. Neurological: No dizziness, no headaches, no numbness, no seizures, no syncope, no weakness, no tremors. Hematologic: No lymphadenopathy, no easy bruising. Psychiatric: No confusion, no hallucinations, no sleep disturbance.    Physical Exam: Filed Vitals:   04/20/12 1000  BP: 140/78  Pulse: 67   the general appearance reveals a well-developed elderly gentleman in no distress.The head and neck exam reveals pupils equal and reactive.  Extraocular movements are full.  There is no scleral icterus.  The mouth and pharynx are normal.  The  neck is supple.  The carotids reveal no bruits.  The jugular venous pressure is normal.  The  thyroid is not enlarged.  There is no lymphadenopathy.  The chest is clear to percussion and auscultation.  There are no rales or rhonchi.  Expansion of the chest is symmetrical.  The precordium is quiet.  The first heart sound is normal.  The second heart sound is physiologically split.  There is no  gallop rub or click.  There is a soft systolic ejection murmur at the base.  No diastolic murmur  There is no abnormal lift or heave.  The abdomen is soft and nontender.  The bowel sounds are normal.  The liver and spleen are not enlarged.  There are no abdominal masses.  There are no abdominal bruits.  Extremities reveal good pedal pulses.  There is mild to moderate pretibial edema bilaterally  There is no cyanosis or clubbing.  Strength is normal and symmetrical in all extremities.  There is no lateralizing weakness.  There are no sensory deficits.  The skin is warm and dry.  There is no rash.     Assessment / Plan: Continue same medication except stop amlodipine altogether and increase hydralazine to 3 times a day.  Blood work today Medical laboratory scientific officer in 4 months for office visit EKG lipid panel hepatic function panel and basal metabolic panel

## 2012-04-20 NOTE — Assessment & Plan Note (Signed)
The patient has not had any recurrent chest pain or angina pectoris. 

## 2012-04-20 NOTE — Assessment & Plan Note (Signed)
His wife reports that when we increased his amlodipine to 10 mg daily his creatinine worsened but more recently his creatinine improved after his amlodipine was reduced back to 5 mg daily

## 2012-04-20 NOTE — Telephone Encounter (Signed)
Will forward to  Dr. Patty Sermons for review   From: Peggye Ley   Sent: 04/20/2012 3:36 PM   To: [facility] Triage   Subject: Non-Urgent Medical Question       Garey Ham is not taking. hYdroxyzine 25mg  at all. This was a mistake. Thank you. Huntley Dec

## 2012-04-20 NOTE — Progress Notes (Signed)
Quick Note:  Please report to patient. The recent labs are stable. Continue same medication and careful diet. Creat 3.2 BUN 63 ______

## 2012-04-20 NOTE — Assessment & Plan Note (Signed)
The patient's edema has improved but is still mild to moderate.  Since he has had a good response to hydralazine 25 mg twice a day we will stop his amlodipine and increase his hydralazine to 25 mg 3 times a day.

## 2012-04-20 NOTE — Patient Instructions (Addendum)
Will obtain labs today and call you with the results (LP/BMET/HFP)  STOP AMLODIPINE AND INCREASE YOUR HYDRALAZINE 25 MG TO 3 TIMES A DAY  Your physician wants you to follow-up in: 4 months with fasting labs (lp/bmet/hfp) AND EKG You will receive a reminder letter in the mail two months in advance. If you don't receive a letter, please call our office to schedule the follow-up appointment.

## 2012-04-21 ENCOUNTER — Telehealth: Payer: Self-pay | Admitting: *Deleted

## 2012-04-21 NOTE — Telephone Encounter (Signed)
Mailed copy of labs and left message to call if any questions  

## 2012-04-21 NOTE — Telephone Encounter (Signed)
Message copied by Burnell Blanks on Wed Apr 21, 2012  2:37 PM ------      Message from: [name]      Created: Tue Apr 20, 2012  3:50 PM       Please report to patient.  The recent labs are stable. Continue same medication and careful diet. Creat 3.2 BUN 63 ------

## 2012-06-01 ENCOUNTER — Other Ambulatory Visit: Payer: Self-pay | Admitting: *Deleted

## 2012-06-01 MED ORDER — METOPROLOL TARTRATE 25 MG PO TABS: 12.5000 mg | ORAL_TABLET | Freq: Two times a day (BID) | ORAL | Status: DC

## 2012-06-15 ENCOUNTER — Other Ambulatory Visit: Payer: Self-pay | Admitting: *Deleted

## 2012-06-15 MED ORDER — NITROGLYCERIN 0.4 MG SL SUBL: 0.4000 mg | SUBLINGUAL_TABLET | SUBLINGUAL | Status: DC | PRN

## 2012-06-25 ENCOUNTER — Telehealth: Payer: Self-pay | Admitting: *Deleted

## 2012-06-25 NOTE — Telephone Encounter (Signed)
Per wife patient was having issues with blood pressure being up. Saw Dr Margo Aye and he increased hydralazine to 50 mg three times a day. Information only

## 2012-08-06 ENCOUNTER — Ambulatory Visit (INDEPENDENT_AMBULATORY_CARE_PROVIDER_SITE_OTHER)
Admission: RE | Admit: 2012-08-06 | Discharge: 2012-08-06 | Disposition: A | Discharge status: Home / Self Care | Payer: Medicare Other | Source: Ambulatory Visit | Attending: Internal Medicine | Admitting: Internal Medicine

## 2012-08-06 ENCOUNTER — Other Ambulatory Visit (INDEPENDENT_AMBULATORY_CARE_PROVIDER_SITE_OTHER): Payer: Medicare Other

## 2012-08-06 ENCOUNTER — Ambulatory Visit (INDEPENDENT_AMBULATORY_CARE_PROVIDER_SITE_OTHER): Payer: Medicare Other | Admitting: Internal Medicine

## 2012-08-06 ENCOUNTER — Encounter: Payer: Self-pay | Admitting: Internal Medicine

## 2012-08-06 VITALS — BP 150/82 | HR 70 | Temp 98.3°F | Ht 64.0 in | Wt 166.0 lb

## 2012-08-06 DIAGNOSIS — R05 Cough: Secondary | ICD-10-CM

## 2012-08-06 DIAGNOSIS — R059 Cough, unspecified: Secondary | ICD-10-CM

## 2012-08-06 LAB — CBC WITH DIFFERENTIAL/PLATELET
Eosinophils Relative: 2.9 % (ref 0.0–5.0)
Lymphocytes Relative: 16.7 % (ref 12.0–46.0)
Monocytes Relative: 10.4 % (ref 3.0–12.0)
Neutrophils Relative %: 69.3 % (ref 43.0–77.0)
Platelets: 221 10*3/uL (ref 150.0–400.0)
WBC: 7.1 10*3/uL (ref 4.5–10.5)

## 2012-08-06 MED ORDER — FAMOTIDINE 20 MG PO TABS: ORAL_TABLET | ORAL | Status: DC

## 2012-08-06 MED ORDER — PANTOPRAZOLE SODIUM 40 MG PO TBEC: 40.0000 mg | DELAYED_RELEASE_TABLET | Freq: Every day | ORAL | Status: DC

## 2012-08-06 MED ORDER — PREDNISONE (PAK) 10 MG PO TABS: ORAL_TABLET | ORAL | Status: DC

## 2012-08-06 NOTE — Patient Instructions (Addendum)
Pantoprazole (protonix) 40 mg   Take 30-60 min before first meal of the day and Pepcid 20 mg one bedtime until return to office - this is the best way to tell whether stomach acid is contributing to your problem.    Prednisone 10 mg take  4 each am x 2 days,   2 each am x 2 days,  1 each am x 2 days and stop  Please see patient coordinator before you leave today  to schedule sinus ct  GERD (REFLUX)  is an extremely common cause of respiratory symptoms, many times with no significant heartburn at all.    It can be treated with medication, but also with lifestyle changes including avoidance of late meals, excessive alcohol, smoking cessation, and avoid fatty foods, chocolate, peppermint, colas, red wine, and acidic juices such as orange juice.  NO MINT OR MENTHOL PRODUCTS SO NO COUGH DROPS  USE SUGARLESS CANDY INSTEAD (jolley ranchers or Stover's)  NO OIL BASED VITAMINS - use powdered substitutes.   Please remember to go to   BOTH  lab and  x-ray department downstairs for your tests - we will call you with the results when they are available.     Please schedule a follow up office visit in 2 weeks, sooner if needed

## 2012-08-06 NOTE — Progress Notes (Signed)
  Subjective:    Patient ID: [name], [name]    DOB: August 01, 1928  MRN: [id number]  HPI  33 yowm never smoker with pna as child completely healthy by teenage then indolent onset daily cough x around 2011 self referred .08/06/12 to pulmonary clinic.   08/06/2012 1st pulmonary eval/ [facility]  cc daily cough x 2 years assoc some sinus problems x decade seems worse p wakes up and stirs around occurs before bfast  Prod white mucus thick and worse again at hs. No better with inhalers or otc cough meds, decongestants.  Sinus problems are all year and consistent of nasal congestion and clear drainage but not typically sneezing or itching.   No obvious daytime variabilty or assoc sob  or cp or chest tightness, subjective wheeze overt   hb symptoms. No unusual exp hx or h/o childhood   asthma or knowledge of premature birth.   Sleeping ok without nocturnal  or premature awakening due to am exacerbation  of respiratory  c/o's or need for noct saba. Also denies any obvious fluctuation of symptoms with weather or environmental changes or other aggravating or alleviating factors except as outlined above     Review of Systems  Constitutional: Negative for fever, chills, activity change, appetite change and unexpected weight change.  HENT: Negative for congestion, sore throat, rhinorrhea, sneezing, trouble swallowing, dental problem, voice change and postnasal drip.   Eyes: Negative for visual disturbance.  Respiratory: Positive for cough. Negative for choking and shortness of breath.   Cardiovascular: Negative for chest pain and leg swelling.  Gastrointestinal: Negative for nausea, vomiting and abdominal pain.  Genitourinary: Negative for difficulty urinating.  Musculoskeletal: Negative for arthralgias.  Skin: Negative for rash.  Psychiatric/Behavioral: Negative for behavioral problems and confusion.       Objective:   Physical Exam Wt Readings from Last 3 Encounters:  08/06/12 166 lb (75.297 kg)   04/20/12 164 lb 12.8 oz (74.753 kg)  12/23/11 165 lb 12.8 oz (75.206 kg)     amb wm nad walks bent over   HEENT: nl dentition, turbinates, and orophanx. Nl external ear canals without cough reflex   NECK :  without JVD/Nodes/TM/ nl carotid upstrokes bilaterally   LUNGS: no acc muscle use, clear to A and P bilaterally without cough on insp or exp maneuvers   CV:  RRR  no s3 or murmur or increase in P2, no edema   ABD:  soft and nontender with nl excursion in the supine position. No bruits or organomegaly, bowel sounds nl  MS:  warm without deformities, calf tenderness, cyanosis or clubbing  SKIN: warm and dry without lesions    NEURO:  alert, approp, no deficits     CXR  08/06/2012 :     1. No radiographic evidence of acute cardiopulmonary disease.  2. Atherosclerosis.     Assessment & Plan:

## 2012-08-07 NOTE — Assessment & Plan Note (Addendum)
The most common causes of chronic cough in immunocompetent adults include the following: upper airway cough syndrome (UACS), previously referred to as postnasal drip syndrome (PNDS), which is caused by variety of rhinosinus conditions; (2) asthma; (3) GERD; (4) chronic bronchitis from cigarette smoking or other inhaled environmental irritants; (5) nonasthmatic eosinophilic bronchitis; and (6) bronchiectasis.   These conditions, singly or in combination, have accounted for up to 94% of the causes of chronic cough in prospective studies.   Other conditions have constituted no >6% of the causes in prospective studies These have included bronchogenic carcinoma, chronic interstitial pneumonia, sarcoidosis, left ventricular failure, ACEI-induced cough, and aspiration from a condition associated with pharyngeal dysfunction.    Chronic cough is often simultaneously caused by more than one condition. A single cause has been found from 38 to 82% of the time, multiple causes from 18 to 62%. Multiply caused cough has been the result of three diseases up to 42% of the time.       Most likely this is a form of  Classic Upper airway cough syndrome, so named because it's frequently impossible to sort out how much is  CR/sinusitis with freq throat clearing (which can be related to primary GERD)   vs  causing  secondary (" extra esophageal")  GERD from wide swings in gastric pressure that occur with throat clearing, often  promoting self use of mint and menthol lozenges that reduce the lower esophageal sphincter tone and exacerbate the problem further in a cyclical fashion.   These are the same pts (now being labeled as having "irritable larynx syndrome" by some cough centers) who not infrequently have a history of having failed to tolerate ace inhibitors,  dry powder inhalers or biphosphonates or report having atypical reflux symptoms that don't respond to standard doses of PPI , and are easily confused as having aecopd  or asthma flares by even experienced allergists/ pulmonologists.   For now max rx for Gerd and eval for allergy  and  If Sinus CT neg > add h1 at hs per guidelines   Reviewed with pt The standardized cough guidelines published in Chest by [place] in 2006 are still the best available and consist of a multiple step process (up to 12!) , not a single office visit,  and are intended  to address this problem logically,  with an alogrithm dependent on response to empiric treatment at  each progressive step  to determine a specific diagnosis with  minimal addtional testing needed. Therefore if adherence is an issue or can't be accurately verified,  it's very unlikely the standard evaluation and treatment will be successful here.    Furthermore, response to therapy (other than acute cough suppression, which should only be used short term with avoidance of narcotic containing cough syrups if possible), can be a gradual process for which the patient may perceive immediate benefit.  Unlike going to an eye doctor where the best perscription is almost always the first one and is immediately effective, this is almost never the case in the management of chronic cough syndromes. Therefore the patient needs to commit up front to consistently adhere to recommendations  for up to 6 weeks of therapy directed at the likely underlying problem(s) before the response can be reasonably evaluated.

## 2012-08-09 ENCOUNTER — Encounter: Payer: Self-pay | Admitting: Internal Medicine

## 2012-08-09 ENCOUNTER — Telehealth: Payer: Self-pay | Admitting: Internal Medicine

## 2012-08-09 LAB — ALLERGY PROFILE REGION II-DC, DE, MD, ~~LOC~~, VA
Allergen, D pternoyssinus,d7: <0.1 kU/L
Bermuda Grass: <0.1 kU/L
Box Elder IgE: <0.1 kU/L
Cat Dander: 0.29 kU/L — ABNORMAL HIGH
Cladosporium Herbarum: <0.1 kU/L
Common Ragweed: <0.1 kU/L
D. farinae: <0.1 kU/L
Dog Dander: 0.28 kU/L — ABNORMAL HIGH
Oak: <0.1 kU/L
Pecan/Hickory Tree IgE: <0.1 kU/L

## 2012-08-09 NOTE — Telephone Encounter (Signed)
Spoke with Almyra Free regarding this PA and she states it's already been handled, he actually did not need a precert I called and made Stacy at [facility] aware and she verbalized understanding

## 2012-08-10 ENCOUNTER — Ambulatory Visit (HOSPITAL_COMMUNITY)
Admission: RE | Admit: 2012-08-10 | Discharge: 2012-08-10 | Disposition: A | Discharge status: Home / Self Care | Payer: Medicare Other | Source: Ambulatory Visit | Attending: Internal Medicine | Admitting: Internal Medicine

## 2012-08-10 ENCOUNTER — Encounter: Payer: Self-pay | Admitting: Internal Medicine

## 2012-08-10 DIAGNOSIS — K056 Periodontal disease, unspecified: Secondary | ICD-10-CM | POA: Insufficient documentation

## 2012-08-10 DIAGNOSIS — R05 Cough: Secondary | ICD-10-CM | POA: Insufficient documentation

## 2012-08-10 DIAGNOSIS — R059 Cough, unspecified: Secondary | ICD-10-CM | POA: Insufficient documentation

## 2012-08-10 DIAGNOSIS — J329 Chronic sinusitis, unspecified: Secondary | ICD-10-CM | POA: Insufficient documentation

## 2012-08-11 NOTE — Progress Notes (Signed)
Quick Note:  Spoke with pt and notified of results per Dr. Wert. Pt verbalized understanding and denied any questions.  ______ 

## 2012-08-20 ENCOUNTER — Ambulatory Visit (INDEPENDENT_AMBULATORY_CARE_PROVIDER_SITE_OTHER): Payer: Medicare Other | Admitting: Internal Medicine

## 2012-08-20 ENCOUNTER — Encounter: Payer: Self-pay | Admitting: Internal Medicine

## 2012-08-20 VITALS — BP 140/80 | HR 70 | Temp 97.8°F | Ht 64.0 in | Wt 166.0 lb

## 2012-08-20 DIAGNOSIS — R059 Cough, unspecified: Secondary | ICD-10-CM

## 2012-08-20 DIAGNOSIS — R05 Cough: Secondary | ICD-10-CM

## 2012-08-20 MED ORDER — MONTELUKAST SODIUM 10 MG PO TABS: ORAL_TABLET | ORAL | Status: DC

## 2012-08-20 NOTE — Patient Instructions (Addendum)
Singulair 10 mg every evening   Add chlortrimeton 4 mg at bedtime   For cough mucinex dm max dose 1200 mg every 12 hours   If not better by 1st September call Libby [phone] and ask her to schedule a methacholine challenge test - if doing better then just make a follow up visit in about 6 weeks to regroup re longterm treatment

## 2012-08-20 NOTE — Assessment & Plan Note (Signed)
-   Allergy profile 08/06/2012 >  No eos,  IgE 130  > POS cats and dogs - sinus ct 08/10/2012 > Minimal mucosal thickening of the maxillary sinuses without the airfluid levels  ddx is between cough variant asthma and  Classic Upper airway cough syndrome, so named because it's frequently impossible to sort out how much is  CR/sinusitis with freq throat clearing (which can be related to primary GERD)   vs  causing  secondary (" extra esophageal")  GERD from wide swings in gastric pressure that occur with throat clearing, often  promoting self use of mint and menthol lozenges that reduce the lower esophageal sphincter tone and exacerbate the problem further in a cyclical fashion.   These are the same pts (now being labeled as having "irritable larynx syndrome" by some cough centers) who not infrequently have a history of having failed to tolerate ace inhibitors,  dry powder inhalers or biphosphonates or report having atypical reflux symptoms that don't respond to standard doses of PPI , and are easily confused as having aecopd or asthma flares by even experienced allergists/ pulmonologists.    Will try max gerd and 1st gn h1 while adding singulair then next step is methacholine challenge

## 2012-08-20 NOTE — Progress Notes (Signed)
Subjective:    Patient ID: [name], male    DOB: [birth year]  MRN: [id number]  Brief patient profile:  25 yowm never smoker with pna as child completely healthy by teenage then indolent onset daily cough x around 2012 self referred  08/06/12 to pulmonary clinic.    HPI  08/06/2012 1st pulmonary eval/ [name]  cc daily cough x 2 years assoc some sinus problems x decade seems worse p wakes up and stirs around occurs before bfast  Prod white mucus thick and worse again at hs. No better with inhalers or otc cough meds, decongestants.  Sinus problems are all year and consistent of nasal congestion and clear drainage but not typically sneezing or itching.  rec Pantoprazole (protonix) 40 mg   Take 30-60 min before first meal of the day and Pepcid 20 mg one bedtime until return to office - this is the best way to tell whether stomach acid is contributing to your problem.   Prednisone 10 mg take  4 each am x 2 days,   2 each am x 2 days,  1 each am x 2 days and stop Please see patient coordinator before you leave today  to schedule sinus ct> min thickening GERD diet.    08/20/2012 f/u ov/[name] re cough Chief Complaint  Patient presents with  . Follow-up    Pt states cough is unchnanged since the last visit. No new co's today.   mucus white thick, no change pattern even when driving back and forth from [facility]. RAST pos  Cat/dog, neither in bedroom and cat is outdoor pet.   No obvious daytime variabilty or assoc sob  or cp or chest tightness, subjective wheeze overt   hb symptoms. No unusual exp hx or h/o childhood   asthma or knowledge of premature birth.   Sleeping ok without nocturnal  or premature awakening due to am exacerbation  of respiratory  c/o's or need for noct saba. Also denies any obvious fluctuation of symptoms with weather or environmental changes or other aggravating or alleviating factors except as outlined above    Current Medications, Allergies, Past Medical History,  Past Surgical History, Family History, and Social History were reviewed in [employer] record.  ROS  The following are not active complaints unless bolded sore throat, dysphagia, dental problems, itching, sneezing,  nasal congestion or excess/ purulent secretions, ear ache,   fever, chills, sweats, unintended wt loss, pleuritic or exertional cp, hemoptysis,  orthopnea pnd or leg swelling, presyncope, palpitations, heartburn, abdominal pain, anorexia, nausea, vomiting, diarrhea  or change in bowel or urinary habits, change in stools or urine, dysuria,hematuria,  rash, arthralgias, visual complaints, headache, numbness weakness or ataxia or problems with walking or coordination,  change in mood/affect or memory.             Objective:   Physical Exam  08/20/2012         166    08/06/12 166 lb (75.297 kg)  04/20/12 164 lb 12.8 oz (74.753 kg)  12/23/11 165 lb 12.8 oz (75.206 kg)     amb depressed appearing wm nad walks bent over   HEENT: nl dentition, turbinates, and orophanx. Nl external ear canals without cough reflex   NECK :  without JVD/Nodes/TM/ nl carotid upstrokes bilaterally   LUNGS: no acc muscle use, clear to A and P bilaterally without cough on insp or exp maneuvers   CV:  RRR  no s3 or murmur or increase in P2, no  edema   ABD:  soft and nontender with nl excursion in the supine position. No bruits or organomegaly, bowel sounds nl  MS:  warm without deformities, calf tenderness, cyanosis or clubbing  SKIN: warm and dry without lesions    NEURO:  alert, approp, no deficits     CXR  08/06/2012 :   1. No radiographic evidence of acute cardiopulmonary disease.  2. Atherosclerosis.     Assessment & Plan:

## 2012-08-23 ENCOUNTER — Ambulatory Visit: Payer: Medicare Other | Admitting: Cardiology

## 2012-08-25 ENCOUNTER — Other Ambulatory Visit: Payer: Self-pay

## 2012-09-14 ENCOUNTER — Other Ambulatory Visit: Payer: Self-pay

## 2012-09-14 DIAGNOSIS — I519 Heart disease, unspecified: Secondary | ICD-10-CM

## 2012-09-14 MED ORDER — CLOPIDOGREL BISULFATE 75 MG PO TABS: 75.0000 mg | ORAL_TABLET | Freq: Every day | ORAL | Status: DC

## 2012-09-28 ENCOUNTER — Telehealth: Payer: Self-pay | Admitting: Internal Medicine

## 2012-09-28 DIAGNOSIS — R05 Cough: Secondary | ICD-10-CM

## 2012-09-28 DIAGNOSIS — R059 Cough, unspecified: Secondary | ICD-10-CM

## 2012-09-28 NOTE — Telephone Encounter (Signed)
Spoke with patients spouse, [name] She states patient is not doing any better.  Still coughing, worse in the mornings. [name] said she would have called earlier however patient has been busy working outdoors. Would like to proceed with methacholine challenge as advised if not any better at last OV: 08/20/12 Patient Instructions    Singulair 10 mg every evening  Add chlortrimeton 4 mg at bedtime  For cough mucinex dm max dose 1200 mg every 12 hours  If not better by 1st September call Libby [phone] and ask her to schedule a methacholine challenge test - if doing better then just make a follow up visit in about 6 weeks to regroup re longterm treatment   [name] is aware I am placing order now and someone from our office will be contacting them to schedule this. [name] verbalized understanding and nothing further needed at this time.

## 2012-09-29 ENCOUNTER — Ambulatory Visit (INDEPENDENT_AMBULATORY_CARE_PROVIDER_SITE_OTHER): Payer: Medicare Other | Admitting: Cardiology

## 2012-09-29 ENCOUNTER — Encounter: Payer: Self-pay | Admitting: Cardiology

## 2012-09-29 VITALS — BP 180/70 | HR 64 | Ht 64.0 in | Wt 162.0 lb

## 2012-09-29 DIAGNOSIS — I259 Chronic ischemic heart disease, unspecified: Secondary | ICD-10-CM

## 2012-09-29 DIAGNOSIS — N189 Chronic kidney disease, unspecified: Secondary | ICD-10-CM

## 2012-09-29 DIAGNOSIS — I119 Hypertensive heart disease without heart failure: Secondary | ICD-10-CM

## 2012-09-29 LAB — LIPID PANEL
HDL: 37.7 mg/dL — ABNORMAL LOW (ref >39.00)
Total CHOL/HDL Ratio: 3
VLDL: 15 mg/dL (ref 0.0–40.0)

## 2012-09-29 LAB — BASIC METABOLIC PANEL
CO2: 24 mEq/L (ref 19–32)
Glucose, Bld: 94 mg/dL (ref 70–99)
Potassium: 4.7 mEq/L (ref 3.5–5.1)
Sodium: 140 mEq/L (ref 135–145)

## 2012-09-29 LAB — HEPATIC FUNCTION PANEL
Bilirubin, Direct: 0.1 mg/dL (ref 0.0–0.3)
Total Bilirubin: 0.4 mg/dL (ref 0.3–1.2)

## 2012-09-29 NOTE — Assessment & Plan Note (Signed)
Patient has had improved blood pressure control since being on hydralazine.  I rechecked his blood pressure myself and it was 136/78 in the right arm sitting after he had been quiet for a while.

## 2012-09-29 NOTE — Assessment & Plan Note (Signed)
The patient has not had any recurrent chest pain or angina. 

## 2012-09-29 NOTE — Progress Notes (Signed)
Peggye Ley Date of Birth:  1929/01/03 [facility] [address] [place], [place]  [address] [phone]         Fax   [phone]  History of Present Illness: This pleasant 79 year old gentleman is seen for a  followup office visit. He has a past history of the large anterior wall myocardial infarction with late presentation.  That was about 5 years ago.  The patient has a history of high blood pressure and chronic kidney disease. His last ejection fraction by echo in March 2011 was 45-50%. His last nuclear stress test in March 2011 showed a large area of infarct with only minimal peri-infarct ischemia and an ejection fraction of 27%. He is managed medically. He has not been experiencing any chest pain but has had some exertional dyspnea which is chronic. He recently developed worsening peripheral edema after we increased his amlodipine from 5 mg up to 10 mg daily. His edema has improved since his PCP Dr. Catalina Pizza switched him from amlodipine to hydralazine. The patient has a chronic nonproductive cough and has been seeing Dr. Sherene Sires. The patient has a history of occasional gout and had an episode recently which lasted about a week and responded to colchicine.  The patient remains on allopurinol.   Current Outpatient Prescriptions  Medication Sig Dispense Refill  . allopurinol (ZYLOPRIM) 100 MG tablet Take 1 tablet (100 mg total) by mouth daily.  90 tablet  3  . aspirin 81 MG tablet Take 81 mg by mouth every other day.        . clopidogrel (PLAVIX) 75 MG tablet Take 1 tablet (75 mg total) by mouth daily.  90 tablet  3  . famotidine (PEPCID) 20 MG tablet One at bedtime  30 tablet  2  . hydrALAZINE (APRESOLINE) 50 MG tablet Take 50 mg by mouth 3 (three) times daily.      . metoprolol tartrate (LOPRESSOR) 25 MG tablet Take 0.5 tablets (12.5 mg total) by mouth 2 (two) times daily.  90 tablet  3  . montelukast (SINGULAIR) 10 MG tablet One at bedtime every night  30 tablet   2  . nitroGLYCERIN (NITROSTAT) 0.4 MG SL tablet Place 1 tablet (0.4 mg total) under the tongue every 5 (five) minutes as needed.  25 tablet  5  . pantoprazole (PROTONIX) 40 MG tablet Take 1 tablet (40 mg total) by mouth daily. Take 30-60 min before first meal of the day  30 tablet  2   No current facility-administered medications for this visit.    Allergies  Allergen Reactions  . Crestor [Rosuvastatin Calcium]     MUSCLE PAIN  . Gentamycin [Gentamicin]     Decreased kidney fx  . Hctz [Hydrochlorothiazide]     dizziness  . Lipitor [Atorvastatin Calcium]     MUSCLE PAIN    Patient Active Problem List   Diagnosis Date Noted  . Cough 05/05/2011  . Osteoarthritis 07/15/2010  . Ischemic heart disease 07/15/2010  . Right bundle branch block 07/15/2010  . Chronic renal insufficiency 07/15/2010  . Benign hypertensive heart disease without heart failure 07/15/2010    History  Smoking status  . Never Smoker   Smokeless tobacco  . Never Used    History  Alcohol Use No    Family History  Problem Relation Age of Onset  . Coronary artery disease Mother   . Emphysema Father     never smoked, worked as a Visual merchandiser    Review of Systems: Constitutional:  no fever chills diaphoresis or fatigue or change in weight.  Head and neck: no hearing loss, no epistaxis, no photophobia or visual disturbance. Respiratory: No cough, shortness of breath or wheezing. Cardiovascular: No chest pain peripheral edema, palpitations. Gastrointestinal: No abdominal distention, no abdominal pain, no change in bowel habits hematochezia or melena. Genitourinary: No dysuria, no frequency, no urgency, no nocturia. Musculoskeletal:No arthralgias, no back pain, no gait disturbance or myalgias. Neurological: No dizziness, no headaches, no numbness, no seizures, no syncope, no weakness, no tremors. Hematologic: No lymphadenopathy, no easy bruising. Psychiatric: No confusion, no hallucinations, no sleep  disturbance.    Physical Exam: Filed Vitals:   09/29/12 1032  BP: 180/70  Pulse: 64   repeat blood pressure is 136/78 right arm sitting relaxed.The head and neck exam reveals pupils equal and reactive.  Extraocular movements are full.  There is no scleral icterus.  The mouth and pharynx are normal.  The neck is supple.  The carotids reveal no bruits.  The jugular venous pressure is normal.  The  thyroid is not enlarged.  There is no lymphadenopathy.  The chest is clear to percussion and auscultation.  There are no rales or rhonchi.  Expansion of the chest is symmetrical.  The precordium is quiet.  The first heart sound is normal.  The second heart sound is physiologically split.  There is no murmur gallop rub or click.  There is no abnormal lift or heave.  The abdomen is soft and nontender.  The bowel sounds are normal.  The liver and spleen are not enlarged.  There are no abdominal masses.  There are no abdominal bruits.  Extremities reveal good pedal pulses.  There is no phlebitis or edema.  There is no cyanosis or clubbing.  Strength is decreased on right side and the patient walks with a crutch .There are no sensory deficits.  The skin is warm and dry.  There is no rash.  EKG today shows normal sinus rhythm with first degree AV block and evidence of an old anterolateral infarct as well as right bundle branch block.  No change since 12/23/11   Assessment / Plan: Overall the patient is stable from the cardiovascular standpoint.  He will monitor his blood pressure at home.  Continue same medication.  Recheck in 4 months for followup office visit lipid panel hepatic function panel and basal metabolic panel.  Blood work today pending

## 2012-09-29 NOTE — Patient Instructions (Addendum)

## 2012-09-29 NOTE — Assessment & Plan Note (Signed)
The patient has a history of chronic renal insufficiency.  We are rechecking renal function today.

## 2012-09-30 NOTE — Progress Notes (Signed)
Quick Note:  Please report to patient. The recent labs are stable. Continue same medication and careful diet. The lipids are good. The liver tests are normal. The BUN is better but the creatinine is higher. I want him to continue to drink plenty of water. I want him to decrease his allopurinol to just 50 mg daily. When he returns for his next office visit we should check a uric acid along with the other labs already ordered. ______

## 2012-10-01 ENCOUNTER — Telehealth: Payer: Self-pay | Admitting: *Deleted

## 2012-10-01 DIAGNOSIS — M109 Gout, unspecified: Secondary | ICD-10-CM

## 2012-10-01 NOTE — Telephone Encounter (Signed)
Advised patient and wife of labs and med change

## 2012-10-01 NOTE — Telephone Encounter (Signed)
Message copied by Burnell Blanks on Fri Oct 01, 2012  3:43 PM ------      Message from: [name]      Created: Thu Sep 30, 2012  2:55 PM       Please report to patient.  The recent labs are stable. Continue same medication and careful diet.  The lipids are good.  The liver tests are normal.  The BUN is better but the creatinine is higher.  I want him to continue to drink plenty of water.  I want him to decrease his allopurinol to just 50 mg daily.  When he returns for his next office visit we should check a uric acid along with the other labs already ordered. ------

## 2012-10-11 ENCOUNTER — Ambulatory Visit (HOSPITAL_COMMUNITY)
Admission: RE | Admit: 2012-10-11 | Discharge: 2012-10-11 | Disposition: A | Discharge status: Home / Self Care | Payer: Medicare Other | Source: Ambulatory Visit | Attending: Internal Medicine | Admitting: Internal Medicine

## 2012-10-11 DIAGNOSIS — R059 Cough, unspecified: Secondary | ICD-10-CM | POA: Insufficient documentation

## 2012-10-11 DIAGNOSIS — R05 Cough: Secondary | ICD-10-CM

## 2012-10-11 MED ORDER — SODIUM CHLORIDE 0.9 % IN NEBU
3.0000 mL | INHALATION_SOLUTION | Freq: Once | RESPIRATORY_TRACT | Status: AC
  Administered 2012-10-11: 3 mL via RESPIRATORY_TRACT

## 2012-10-11 MED ORDER — METHACHOLINE 1 MG/ML NEB SOLN
2.0000 mL | Freq: Once | RESPIRATORY_TRACT | Status: AC
  Administered 2012-10-11: 2 mg via RESPIRATORY_TRACT

## 2012-10-11 MED ORDER — METHACHOLINE 0.25 MG/ML NEB SOLN
2.0000 mL | Freq: Once | RESPIRATORY_TRACT | Status: AC
  Administered 2012-10-11: 0.5 mg via RESPIRATORY_TRACT

## 2012-10-11 MED ORDER — ALBUTEROL SULFATE (5 MG/ML) 0.5% IN NEBU
2.5000 mg | INHALATION_SOLUTION | Freq: Once | RESPIRATORY_TRACT | Status: AC
  Administered 2012-10-11: 2.5 mg via RESPIRATORY_TRACT

## 2012-10-11 MED ORDER — METHACHOLINE 4 MG/ML NEB SOLN: 2.0000 mL | Freq: Once | RESPIRATORY_TRACT | Status: DC

## 2012-10-11 MED ORDER — METHACHOLINE 16 MG/ML NEB SOLN: 2.0000 mL | Freq: Once | RESPIRATORY_TRACT | Status: DC

## 2012-10-11 MED ORDER — METHACHOLINE 0.0625 MG/ML NEB SOLN
2.0000 mL | Freq: Once | RESPIRATORY_TRACT | Status: AC
  Administered 2012-10-11: 0.125 mg via RESPIRATORY_TRACT

## 2012-10-20 ENCOUNTER — Telehealth: Payer: Self-pay | Admitting: Internal Medicine

## 2012-10-20 DIAGNOSIS — R059 Cough, unspecified: Secondary | ICD-10-CM

## 2012-10-20 DIAGNOSIS — R05 Cough: Secondary | ICD-10-CM

## 2012-10-20 NOTE — Telephone Encounter (Signed)
It looks like it is pos for asthma > needs to set up ov asap to regroup for longterm rx

## 2012-10-20 NOTE — Telephone Encounter (Signed)
Called, spoke with pt's wife.  Pt has Methacholine challenge test done on 9/22.  They are requesting results.  Advised this could take a few weeks to get results back, and I would forward msg to MW to let him know they are calling requesting this.  Dr. Sherene Sires, have you received results yet?

## 2012-10-20 NOTE — Telephone Encounter (Signed)
Results received and place din MW look at

## 2012-10-20 NOTE — Telephone Encounter (Signed)
[id number]. I held 4pm slot tomorrow for the pt if he calls back today. If not then we need to take the hold off. Carron Curie, CMA

## 2012-10-20 NOTE — Telephone Encounter (Signed)
I called [name] and she will have this faxed over to triage. Will await fax

## 2012-10-20 NOTE — Telephone Encounter (Signed)
Have not seen it - have the lab fax me the prelim copy

## 2012-10-21 NOTE — Telephone Encounter (Signed)
I spoke with pt spouse. He is scheduled to come in on Monday to see MW. Nothing further needed

## 2012-10-25 ENCOUNTER — Ambulatory Visit (INDEPENDENT_AMBULATORY_CARE_PROVIDER_SITE_OTHER): Payer: Medicare Other | Admitting: Internal Medicine

## 2012-10-25 ENCOUNTER — Encounter: Payer: Self-pay | Admitting: Internal Medicine

## 2012-10-25 VITALS — BP 140/70 | HR 60 | Temp 97.2°F | Ht 64.0 in | Wt 164.6 lb

## 2012-10-25 DIAGNOSIS — I10 Essential (primary) hypertension: Secondary | ICD-10-CM

## 2012-10-25 DIAGNOSIS — R059 Cough, unspecified: Secondary | ICD-10-CM

## 2012-10-25 DIAGNOSIS — R05 Cough: Secondary | ICD-10-CM

## 2012-10-25 MED ORDER — NEBIVOLOL HCL 5 MG PO TABS: 5.0000 mg | ORAL_TABLET | Freq: Every day | ORAL | Status: DC

## 2012-10-25 MED ORDER — MOMETASONE FURO-FORMOTEROL FUM 100-5 MCG/ACT IN AERO: INHALATION_SPRAY | RESPIRATORY_TRACT | Status: DC

## 2012-10-25 MED ORDER — AZELASTINE-FLUTICASONE 137-50 MCG/ACT NA SUSP: 1.0000 | Freq: Two times a day (BID) | NASAL | Status: DC

## 2012-10-25 NOTE — Patient Instructions (Addendum)
Dulera 100  Take 2 puffs first thing in am and then another 2 puffs about 12 hours later.  bystolic 5 mg one daily    Stop singulair and also the chlortrimeton   dymista one twice daily should stop your nose from running  Stay on acid suppression just as you are  GERD (REFLUX)  is an extremely common cause of respiratory symptoms, many times with no significant heartburn at all.    It can be treated with medication, but also with lifestyle changes including avoidance of late meals, excessive alcohol, smoking cessation, and avoid fatty foods, chocolate, peppermint, colas, red wine, and acidic juices such as orange juice.  NO MINT OR MENTHOL PRODUCTS SO NO COUGH DROPS  USE SUGARLESS CANDY INSTEAD (jolley ranchers or Stover's)  NO OIL BASED VITAMINS - use powdered substitutes.    Please schedule a follow up office visit in 6 weeks, call sooner if needed

## 2012-10-25 NOTE — Progress Notes (Signed)
Subjective:    Patient ID: [name], male    DOB: 06-03-1928  MRN: [id number]  Brief patient profile:  75 yowm never smoker with pna as child completely healthy by teenage then indolent onset daily cough x around 2012 self referred  08/06/12 to pulmonary clinic.    HPI 08/06/2012 1st pulmonary eval/ [name]  cc daily cough x 2 years assoc some sinus problems x decade seems worse p wakes up and stirs around occurs before bfast  Prod white mucus thick and worse again at hs. No better with inhalers or otc cough meds, decongestants.  Sinus problems are all year and consistent of nasal congestion and clear drainage but not typically sneezing or itching.  rec Pantoprazole (protonix) 40 mg   Take 30-60 min before first meal of the day and Pepcid 20 mg one bedtime until return to office - this is the best way to tell whether stomach acid is contributing to your problem.   Prednisone 10 mg take  4 each am x 2 days,   2 each am x 2 days,  1 each am x 2 days and stop Please see patient coordinator before you leave today  to schedule sinus ct> min thickening GERD diet.    08/20/2012 f/u ov/[name] re cough Chief Complaint  Patient presents with  . Follow-up    Pt states cough is unchnanged since the last visit. No new co's today.   mucus white thick, no change pattern even when driving back and forth from [facility]. RAST pos  Cat/dog, neither in bedroom and cat is outdoor pet. rec Singulair 10 mg every evening  Add chlortrimeton 4 mg at bedtime  For cough mucinex dm max dose 1200 mg every 12 hours     10/25/2012 f/u ov/[name] re:  Cough/ POS MCT "nothing ever works, even prednisoneMuseum/gallery conservator Complaint  Patient presents with  . Follow-up    Here to disucss pos MCT. Breathing unchanged since his last visit.   cough present all through the day > thick white chokes him "points to mid neck" = tsp of white mucus at most, never discolored  Cough is immediately when lie down  Mouth tends to be dry, nose  tends to get clogged up, nose runs when eat x forever = decades     No obvious daytime variabilty or assoc sob  or cp or chest tightness, subjective wheeze overt   hb symptoms. No unusual exp hx or h/o childhood   asthma or knowledge of premature birth.   Sleeping ok without nocturnal  or premature awakening due to am exacerbation  of respiratory  c/o's or need for noct saba. Also denies any obvious fluctuation of symptoms with weather or environmental changes or other aggravating or alleviating factors except as outlined above    Current Medications, Allergies, Past Medical History, Past Surgical History, Family History, and Social History were reviewed in [employer] record.  ROS  The following are not active complaints unless bolded sore throat, dysphagia, dental problems, itching, sneezing,  nasal congestion or excess/ purulent secretions, ear ache,   fever, chills, sweats, unintended wt loss, pleuritic or exertional cp, hemoptysis,  orthopnea pnd or leg swelling, presyncope, palpitations, heartburn, abdominal pain, anorexia, nausea, vomiting, diarrhea  or change in bowel or urinary habits, change in stools or urine, dysuria,hematuria,  rash, arthralgias, visual complaints, headache, numbness weakness or ataxia or problems with walking or coordination,  change in mood/affect or memory.     Kouffman Reflux  v Neurogenic Cough Differentiator Reflux Comments  Do you awaken from a sound sleep coughing violently?                            With trouble breathing? no   Do you have choking episodes when you cannot  Get enough air, gasping for air ?              Yes   Do you usually cough when you lie down into  The bed, or when you just lie down to rest ?                          Yes   Do you usually cough after meals or eating?         Yes   Do you cough when (or after) you bend over?    no   GERD SCORE     Kouffman Reflux v Neurogenic Cough Differentiator Neurogenic    Do you more-or-less cough all day long? intermittent   Does change of temperature make you cough? no   Does laughing or chuckling cause you to cough? no   Do fumes (perfume, automobile fumes, burned  Toast, etc.,) cause you to cough ?      no   Does speaking, singing, or talking on the phone cause you to cough   ?               no   Neurogenic/Airway score                    Objective:   Physical Exam  08/20/2012         166 >  10/25/12  164    08/06/12 166 lb (75.297 kg)  04/20/12 164 lb 12.8 oz (74.753 kg)  12/23/11 165 lb 12.8 oz (75.206 kg)     amb depressed appearing wm nad walks bent over   HEENT: nl dentition, turbinates, and orophanx. Nl external ear canals without cough reflex   NECK :  without JVD/Nodes/TM/ nl carotid upstrokes bilaterally   LUNGS: no acc muscle use, clear to A and P bilaterally without cough on insp or exp maneuvers   CV:  RRR  no s3 or murmur or increase in P2, no edema   ABD:  soft and nontender with nl excursion in the supine position. No bruits or organomegaly, bowel sounds nl  MS:  warm without deformities, calf tenderness, cyanosis or clubbing  SKIN: warm and dry without lesions    NEURO:  alert, approp, no deficits     CXR  08/06/2012 :   1. No radiographic evidence of acute cardiopulmonary disease.  2. Atherosclerosis.     Assessment & Plan:

## 2012-10-26 ENCOUNTER — Encounter: Payer: Self-pay | Admitting: Cardiology

## 2012-10-26 DIAGNOSIS — I10 Essential (primary) hypertension: Secondary | ICD-10-CM | POA: Insufficient documentation

## 2012-10-26 NOTE — Assessment & Plan Note (Signed)
-   Allergy profile 08/06/2012 >  No eos,  IgE 130  > POS cats and dogs - sinus ct 08/10/2012 > Minimal mucosal thickening of the maxillary sinuses without the airfluid levels. - trial of singulair and 1st gen h1 at hs started 08/20/2012  - MCT 10/11/12 > Pos at 1.0 but no cough  - hfa 75% 10/25/2012  > trial of dulera 100 and off lopressor  The proper method of use, as well as anticipated side effects, of a metered-dose inhaler are discussed and demonstrated to the patient. Improved effectiveness after extensive coaching during this visit to a level of approximately  75%   See instructions for specific recommendations which were reviewed directly with the patient who was given a copy with highlighter outlining the key components.

## 2012-10-26 NOTE — Assessment & Plan Note (Signed)
Strongly prefer in this setting: Bystolic, the most beta -1  selective Beta blocker available in sample form, with bisoprolol the most selective generic choice  on the market.  

## 2012-11-05 ENCOUNTER — Other Ambulatory Visit: Payer: Self-pay | Admitting: Cardiology

## 2012-11-05 ENCOUNTER — Other Ambulatory Visit (HOSPITAL_COMMUNITY): Payer: Self-pay | Admitting: Orthopedic Surgery

## 2012-11-05 DIAGNOSIS — M25551 Pain in right hip: Secondary | ICD-10-CM

## 2012-11-05 DIAGNOSIS — T84038A Mechanical loosening of other internal prosthetic joint, initial encounter: Secondary | ICD-10-CM

## 2012-11-08 ENCOUNTER — Encounter (HOSPITAL_COMMUNITY)
Admission: RE | Admit: 2012-11-08 | Discharge: 2012-11-08 | Disposition: A | Discharge status: Home / Self Care | Payer: Medicare Other | Source: Ambulatory Visit | Attending: Orthopedic Surgery | Admitting: Orthopedic Surgery

## 2012-11-08 ENCOUNTER — Encounter (HOSPITAL_COMMUNITY): Payer: Self-pay

## 2012-11-08 DIAGNOSIS — Z96649 Presence of unspecified artificial hip joint: Secondary | ICD-10-CM | POA: Insufficient documentation

## 2012-11-08 DIAGNOSIS — R262 Difficulty in walking, not elsewhere classified: Secondary | ICD-10-CM | POA: Insufficient documentation

## 2012-11-08 DIAGNOSIS — M25551 Pain in right hip: Secondary | ICD-10-CM

## 2012-11-08 DIAGNOSIS — T84038A Mechanical loosening of other internal prosthetic joint, initial encounter: Secondary | ICD-10-CM

## 2012-11-08 DIAGNOSIS — R937 Abnormal findings on diagnostic imaging of other parts of musculoskeletal system: Secondary | ICD-10-CM | POA: Insufficient documentation

## 2012-11-08 HISTORY — DX: Unspecified asthma, uncomplicated: J45.909

## 2012-11-08 HISTORY — DX: Disorder of kidney and ureter, unspecified: N28.9

## 2012-11-08 HISTORY — DX: Malignant (primary) neoplasm, unspecified: C80.1

## 2012-11-08 MED ORDER — TECHNETIUM TC 99M MEDRONATE IV KIT
25.0000 | PACK | Freq: Once | INTRAVENOUS | Status: AC | PRN
  Administered 2012-11-08: 25 via INTRAVENOUS

## 2012-11-12 ENCOUNTER — Encounter: Payer: Self-pay | Admitting: Cardiology

## 2012-11-12 NOTE — Telephone Encounter (Signed)
Spoke with patient wife and advised to call Dr Margo Aye PCP, verbalized understanding.

## 2012-11-29 ENCOUNTER — Ambulatory Visit (HOSPITAL_COMMUNITY)
Admission: RE | Admit: 2012-11-29 | Discharge: 2012-11-29 | Disposition: A | Discharge status: Home / Self Care | Payer: Medicare Other | Source: Ambulatory Visit | Attending: Orthopedic Surgery | Admitting: Orthopedic Surgery

## 2012-11-29 DIAGNOSIS — M25559 Pain in unspecified hip: Secondary | ICD-10-CM | POA: Insufficient documentation

## 2012-11-29 DIAGNOSIS — IMO0001 Reserved for inherently not codable concepts without codable children: Secondary | ICD-10-CM | POA: Insufficient documentation

## 2012-11-29 DIAGNOSIS — R269 Unspecified abnormalities of gait and mobility: Secondary | ICD-10-CM | POA: Insufficient documentation

## 2012-11-29 DIAGNOSIS — M6281 Muscle weakness (generalized): Secondary | ICD-10-CM | POA: Insufficient documentation

## 2012-11-29 DIAGNOSIS — R262 Difficulty in walking, not elsewhere classified: Secondary | ICD-10-CM | POA: Insufficient documentation

## 2012-11-29 NOTE — Evaluation (Signed)
Physical Therapy Evaluation  Patient Details  Name: [name] MRN: [id number] Date of Birth: November 06, 1928  Today's Date: 11/29/2012 Time: 4098-1191 PT Time Calculation (min): 44 min Charges: 1 evaluation              Visit#: 1 of 10  Re-eval: 12/29/12 Assessment Diagnosis: Rt hip weakness Next MD Visit: Dr. Despina Hick - couple months  Authorization: Medicare    Authorization Time Period:    Authorization Visit#: 1 of 10   Past Medical History:  Past Medical History  Diagnosis Date  . Hypertension   . Gout   . IHD (ischemic heart disease)     Remote MI in 2009 with late presentation; no reperfusion. Has large area of infarct in the apical anterior area on last nuclear in 2011. Managed medically  . Chronic renal insufficiency     creatinine ranges around 2.8  . OA (osteoarthritis) of knee     Left knee with revision and replacement of prosthetic L TKR  . 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 DOE (dyspnea on exertion)   . Herpes zoster 12/2009  . Peripheral edema   . RBBB (right bundle branch block)   . History of BPH   . Cancer   . Renal insufficiency   . Asthma    Past Surgical History:  Past Surgical History  Procedure Laterality Date  . Cardiac catheterization  03/24/2007  . Total knee arthroplasty      left knee  . Cardiovascular stress test  04/02/2009    EF 27% AND A LARGE AREA OF INFARCT IN THE APICAL ANTERIOR WITH MILD PERI-INFARCT ISCHEMIA  . Transthoracic echocardiogram  04/04/2009    EF 45-50%  . Knee surgery  1998    left knee    Subjective Symptoms/Limitations Symptoms: Pt is an 79 year old male referred to PT for Rt hip weakness.  He has a hx of 3 hip replacements (started in 1982) and his last was 2010 w/pelvic reconstruction and bone grafts.  Pt only recieved HHPT services.  He feels that over the years he has become weak in his hip and is now is walking with a crutch and was walking independently for about 5 months due to weakness and balance  disturbance.  He reports difficulty leaning over and picking something up.  Pertinent History: extensive hx of orthopedic surgeries. And cancer Limitations: House hold activities How long can you sit comfortably?: no difficutly How long can you stand comfortably?: no difficulty How long can you walk comfortably?: 10 minutes Patient Stated Goals: get away from the crutch and walk independently again.  Pain Assessment Currently in Pain?: No/denies  Precautions/Restrictions  Precautions Precautions: Fall  Balance Screening Balance Screen Has the patient fallen in the past 6 months: Yes How many times?: 6 Has the patient had a decrease in activity level because of a fear of falling? : Yes Is the patient reluctant to leave their home because of a fear of falling? : No  Prior Functioning  Prior Function Level of Independence: Independent with basic ADLs Driving: Yes Vocation: Full time employment Vocation Requirements: [employer] on the [employer]  Cognition/Observation Observation/Other Assessments Observations: poor circulations to hands  Other Assessments: 2/3 in shoe lift on Rt shoe  Sensation/Coordination/Flexibility/Functional Tests Functional Tests Functional Tests: FOTO: 66/34  Assessment RLE Strength RLE Overall Strength Comments: Hip IR: 2/5, Hip ER: 2/5 Right Hip Flexion: 4/5 Right Hip Extension: 3/5 Right Hip ABduction: 2/5 Right Hip ADduction: 2+/5 Right Knee  Flexion: 5/5 Right Knee Extension: 5/5 LLE AROM (degrees) Left Knee Extension: 10 Left Knee Flexion: 120 LLE Strength Left Hip Flexion: 5/5 Left Hip Extension: 3+/5 Left Hip ABduction: 4/5 Left Hip ADduction: 3+/5 Left Knee Flexion: 5/5 Left Knee Extension: 5/5 Lumbar Assessment Lumbar Assessment: Exceptions to [facility] Lumbar AROM Overall Lumbar AROM Comments: unable to come into full lumbar extension by 10 degrees  Mobility/Balance  Ambulation/Gait Ambulation/Gait: Yes Gait Pattern:  Antalgic;Lateral hip instability Posture/Postural Control Posture/Postural Control: Postural limitations Postural Limitations: stands in 20 degrees lumbar flexion, unable to come to nuetral. Without shoes: standing: hips are equal, knee in 10 degrees of flexion (unable to fully extend), supine: LLE shorter by 1/4 inch. Static Standing Balance Single Leg Stance - Right Leg: 0 Single Leg Stance - Left Leg: 0 Tandem Stance - Right Leg: 0 Tandem Stance - Left Leg: 0 Rhomberg - Eyes Opened: 10 Rhomberg - Eyes Closed: 5   Physical Therapy Assessment and Plan PT Assessment and Plan Clinical Impression Statement: Pt is an 79 year old male referred to PT for Rt hip weakness with impairments listed below.  At this time feel pt is most limited by his lack of Lt knee extension causing dysfunction to RLE.  Pt is also greatly limited by fascial restrictions and significant decreae in trunk and BLE flexibility causing a decrease in motion.  At this time evaluated pt with and without a shoe lift.  He has improved alignment without shoe lift when he is encouraged to maintain an upright postion.  Also I am concerned about a non healing wound to his Rt pretibial region which also has increased swelling.  Discussed with pt and wife to talk with his PCP about his wound.  Pt will benefit from skilled therapeutic intervention in order to improve on the following deficits: Abnormal gait;Decreased balance;Decreased strength;Decreased coordination;Impaired tone;Difficulty walking;Pain;Impaired perceived functional ability;Decreased range of motion;Decreased mobility;Increased fascial restricitons;Increased muscle spasms;Improper body mechanics Rehab Potential: Fair Clinical Impairments Affecting Rehab Potential: FOTO predicts increased limiation PT Frequency: Min 3X/week (3x/week x2 weeks, 2x/week x2 weeks) PT Duration: 4 weeks PT Plan: complete all activities in even shoes if pt brings them in. NuStep for LE  strengthening, squats, Bil knee flexion, hip abduction if able.  Mat activities: iso hip adduction and hip abduction (with manual resistance or theraband), bridging, S/L clams, heel and toe raises seated.  COntinue to focus on improve gluteus medius strength (add hip hikes when wable) and postural exercises to improve posture.  Progress towards gait activities.     Goals Home Exercise Program Pt/caregiver will Perform Home Exercise Program: For increased ROM;For increased strengthening;For improved balance;Independently PT Goal: Perform Home Exercise Program - Progress: Goal set today PT Short Term Goals Time to Complete Short Term Goals: 2 weeks PT Short Term Goal 1: Pt will improve Rt hip abduction by to a 3+/5 in order to begin ambulating independently.  PT Short Term Goal 2: Pt will improve his LE activitiy tolerance in order to ambualte x15 minutes without an AD with moderate gait impairments.  PT Short Term Goal 3: Pt will improve his proprioceptive awarness and demonstrate partial tandem stance x20 seconds on solid surface to decrease falls risk.  PT Long Term Goals Time to Complete Long Term Goals: 4 weeks PT Long Term Goal 1: Pt will improve his postural strength and flexibility to [facility] in order to maintain neutral spine postion in standing for 10 minutes to decrease risk of secondary injury.  PT Long Term Goal 2: Pt  will improve his FOTO to status greater than 66% and limiations less than 44% for improve percieved functional ability.  Long Term Goal 3: Pt will improve his RLE strength to [facility] in order to ambulate with minimal trendelenberg gait impariments to reduce risk of secondary injury.   Problem List Patient Active Problem List   Diagnosis Date Noted  . Generalized muscle weakness 11/29/2012  . Difficulty in walking(719.7) 11/29/2012  . HBP (high blood pressure) 10/26/2012  . Cough 05/05/2011  . Osteoarthritis 07/15/2010  . Ischemic heart disease 07/15/2010  . Right bundle  branch block 07/15/2010  . Chronic renal insufficiency 07/15/2010  . Benign hypertensive heart disease without heart failure 07/15/2010    PT - End of Session Activity Tolerance: Patient tolerated treatment well General Behavior During Therapy: WFL for tasks assessed/performed PT Plan of Care PT Home Exercise Plan: given PT Patient Instructions: importance of HEP, discussing bringing in normal shoes to address gait mechanics.  Consulted and Agree with Plan of Care: Patient  GP Functional Assessment Tool Used: FOTO: 66/34 Functional Limitation: Mobility: Walking and moving around Mobility: Walking and Moving Around Current Status (Z6109): At least 20 percent but less than 40 percent impaired, limited or restricted Mobility: Walking and Moving Around Goal Status [phone]): At least 20 percent but less than 40 percent impaired, limited or restricted  Shreyansh Tiffany, MPT, ATC 11/29/2012, 6:04 PM  Physician Documentation Your signature is required to indicate approval of the treatment plan as stated above.  Please sign and either send electronically or make a copy of this report for your files and return this physician signed original.   Please mark one 1.__approve of plan  2. ___approve of plan with the following conditions.   ______________________________                                                          _____________________ Physician Signature                                                                                                             Date

## 2012-12-01 ENCOUNTER — Ambulatory Visit (HOSPITAL_COMMUNITY)
Admission: RE | Admit: 2012-12-01 | Discharge: 2012-12-01 | Disposition: A | Discharge status: Home / Self Care | Payer: Medicare Other | Source: Ambulatory Visit | Attending: Internal Medicine | Admitting: Internal Medicine

## 2012-12-01 DIAGNOSIS — R262 Difficulty in walking, not elsewhere classified: Secondary | ICD-10-CM

## 2012-12-01 DIAGNOSIS — M6281 Muscle weakness (generalized): Secondary | ICD-10-CM

## 2012-12-01 NOTE — Progress Notes (Signed)
Physical Therapy Treatment Patient Details  Name: [name] MRN: [id number] Date of Birth: [birth year]  Today's Date: 12/01/2012 Time: 4098-1191 PT Time Calculation (min): 53 min Charge: TE [id number]  Visit#: 2 of 10  Re-eval: 12/29/12 Assessment Diagnosis: Rt hip weakness Next MD Visit: Dr. Despina Hick - couple months  Authorization: Medicare  Authorization Time Period:    Authorization Visit#: 2 of 10   Subjective: Symptoms/Limitations Symptoms: Pt reported he was pain free today.  Reports complaince with HEP without question.   Pain Assessment Currently in Pain?: No/denies  Precautions/Restrictions  Precautions Precautions: Fall  Exercise/Treatments Stretches Active Hamstring Stretch: 2 reps;30 seconds Aerobic Stationary Bike: Nustep level x 10 minutes Standing Knee Flexion: 10 reps;Limitations Knee Flexion Limitations: BLE Functional Squat: 10 reps Supine Hip Adduction Isometric: Both;10 reps Bridges: Both;10 reps Straight Leg Raises: Both;10 reps Other Supine Knee Exercises: Isometric hip flexion 10x 5" Bil LE Sidelying Hip ABduction: AAROM;Right;10 reps Clams: 10x 10     Physical Therapy Assessment and Plan PT Assessment and Plan Clinical Impression Statement: Pt came into dept with shoe lift on Rt LE.  Following observation no noted leg discrepancy .  Pt educeted on purpose of shoe lift and encouraged to bring even tennis shoes next session.  Began therex for Rt LE strengthening with AAROM reqquired for glut med exercises due to weakness.  Pt encouraged to increase frequency of HEP for maximum benefits.  Pt limited by fatigue at end of session, no reports of pain.   PT Plan: complete all activities in even shoes if pt brings them in. NuStep for LE strengthening, squats, Bil knee flexion, hip abduction if able.  Mat activities: iso hip adduction and hip abduction (with manual resistance or theraband), bridging, S/L clams, heel and toe raises seated.  COntinue  to focus on improve gluteus medius strength (add hip hikes when wable) and postural exercises to improve posture.  Progress towards gait activities.     Goals Home Exercise Program Pt/caregiver will Perform Home Exercise Program: For increased ROM;For increased strengthening;For improved balance;Independently PT Short Term Goals Time to Complete Short Term Goals: 2 weeks PT Short Term Goal 1: Pt will improve Rt hip abduction by to a 3+/5 in order to begin ambulating independently.  PT Short Term Goal 1 - Progress: Progressing toward goal PT Short Term Goal 2: Pt will improve his LE activitiy tolerance in order to ambualte x15 minutes without an AD with moderate gait impairments.  PT Short Term Goal 3: Pt will improve his proprioceptive awarness and demonstrate partial tandem stance x20 seconds on solid surface to decrease falls risk.  PT Long Term Goals Time to Complete Long Term Goals: 4 weeks PT Long Term Goal 1: Pt will improve his postural strength and flexibility to [facility] in order to maintain neutral spine postion in standing for 10 minutes to decrease risk of secondary injury.  PT Long Term Goal 2: Pt will improve his FOTO to status greater than 66% and limiations less than 44% for improve percieved functional ability.  Long Term Goal 3: Pt will improve his RLE strength to [facility] in order to ambulate with minimal trendelenberg gait impariments to reduce risk of secondary injury.   Problem List Patient Active Problem List   Diagnosis Date Noted  . Generalized muscle weakness 11/29/2012  . Difficulty in walking(719.7) 11/29/2012  . HBP (high blood pressure) 10/26/2012  . Cough 05/05/2011  . Osteoarthritis 07/15/2010  . Ischemic heart disease 07/15/2010  . Right bundle branch block 07/15/2010  .  Chronic renal insufficiency 07/15/2010  . Benign hypertensive heart disease without heart failure 07/15/2010    PT - End of Session Activity Tolerance: Patient tolerated treatment  well General Behavior During Therapy: [facility] for tasks assessed/performed  GP    Juel Burrow 12/01/2012, 4:45 PM

## 2012-12-03 ENCOUNTER — Ambulatory Visit (HOSPITAL_COMMUNITY)
Admission: RE | Admit: 2012-12-03 | Discharge: 2012-12-03 | Disposition: A | Discharge status: Home / Self Care | Payer: Medicare Other | Source: Ambulatory Visit | Attending: Internal Medicine | Admitting: Internal Medicine

## 2012-12-03 DIAGNOSIS — R262 Difficulty in walking, not elsewhere classified: Secondary | ICD-10-CM

## 2012-12-03 DIAGNOSIS — M6281 Muscle weakness (generalized): Secondary | ICD-10-CM

## 2012-12-03 NOTE — Progress Notes (Signed)
Physical Therapy Treatment Patient Details  Name: [name] MRN: [id number] Date of Birth: 1928-05-03  Today's Date: 12/03/2012 Time: 1478-2956 PT Time Calculation (min): 58 min Charge: TE 2130-8657  Visit#: 3 of 10  Re-eval: 12/29/12 Assessment Diagnosis: Rt hip weakness Next MD Visit: Dr. Despina Hick - couple months  Authorization: Medicare  Authorization Time Period:    Authorization Visit#: 3 of 10   Subjective: Symptoms/Limitations Symptoms: Pt brought in tennis shoes of equal height/  Pt's wife reported increase ease with hip abduction at home, stated requires less assistance.  Currently pain free. Pain Assessment Currently in Pain?: No/denies  Precautions/Restrictions  Precautions Precautions: Fall  Exercise/Treatments Stretches Passive Hamstring Stretch: 3 reps;60 seconds;Limitations Passive Hamstring Stretch Limitations: with rope Aerobic Stationary Bike: Nustep level x 10 minutes Standing Heel Raises: 10 reps;Limitations Heel Raises Limitations: toe raises 10x Other Standing Knee Exercises: side stepping in even shoes Seated Other Seated Knee Exercises: isometric adduction 10x 5" Other Seated Knee Exercises: abduction with green tband 10x5" Supine Hip Adduction Isometric: Both;2 sets;10 reps Bridges: 2 sets;10 reps Straight Leg Raises: Both;10 reps Sidelying Hip ABduction: AAROM;Right;10 reps Clams: 10x 10"    Physical Therapy Assessment and Plan PT Assessment and Plan Clinical Impression Statement: Pt came into dept wtih even shoe height,progressed hip strengthening exercises with even shoe height.  Pt required AAROM with glut med exercises due to weakness and educated on compensation of other musculature with movements.  Wife instructed proper form to assist wtih HEP at home for maximum gluteal strengthening benefits.  Pt c/o hamstrings cramps during bridges, instructed stretching to assist with cramps,   PT Plan: complete all activities in even  shoes if pt brings them in. NuStep for LE strengthening, squats, Bil knee flexion, hip abduction if able.  Mat activities: iso hip adduction and hip abduction (with manual resistance or theraband), bridging, S/L clams, heel and toe raises seated.  COntinue to focus on improve gluteus medius strength (add hip hikes when wable) and postural exercises to improve posture.  Progress towards gait activities.     Goals Home Exercise Program Pt/caregiver will Perform Home Exercise Program: For increased ROM;For increased strengthening;For improved balance;Independently PT Short Term Goals Time to Complete Short Term Goals: 2 weeks PT Short Term Goal 1: Pt will improve Rt hip abduction by to a 3+/5 in order to begin ambulating independently.  PT Short Term Goal 1 - Progress: Progressing toward goal PT Short Term Goal 2: Pt will improve his LE activitiy tolerance in order to ambualte x15 minutes without an AD with moderate gait impairments.  PT Short Term Goal 3: Pt will improve his proprioceptive awarness and demonstrate partial tandem stance x20 seconds on solid surface to decrease falls risk.  PT Long Term Goals Time to Complete Long Term Goals: 4 weeks PT Long Term Goal 1: Pt will improve his postural strength and flexibility to [facility] in order to maintain neutral spine postion in standing for 10 minutes to decrease risk of secondary injury.  PT Long Term Goal 1 - Progress: Progressing toward goal PT Long Term Goal 2: Pt will improve his FOTO to status greater than 66% and limiations less than 44% for improve percieved functional ability.  Long Term Goal 3: Pt will improve his RLE strength to [facility] in order to ambulate with minimal trendelenberg gait impariments to reduce risk of secondary injury.   Problem List Patient Active Problem List   Diagnosis Date Noted  . Generalized muscle weakness 11/29/2012  . Difficulty in walking(719.7)  11/29/2012  . HBP (high blood pressure) 10/26/2012  . Cough  05/05/2011  . Osteoarthritis 07/15/2010  . Ischemic heart disease 07/15/2010  . Right bundle branch block 07/15/2010  . Chronic renal insufficiency 07/15/2010  . Benign hypertensive heart disease without heart failure 07/15/2010    PT - End of Session Activity Tolerance: Patient tolerated treatment well;Patient limited by fatigue General Behavior During Therapy: [facility] for tasks assessed/performed  GP    Juel Burrow 12/03/2012, 5:04 PM

## 2012-12-06 ENCOUNTER — Encounter: Payer: Self-pay | Admitting: Internal Medicine

## 2012-12-06 ENCOUNTER — Ambulatory Visit (HOSPITAL_COMMUNITY): Payer: Medicare Other

## 2012-12-06 ENCOUNTER — Ambulatory Visit (INDEPENDENT_AMBULATORY_CARE_PROVIDER_SITE_OTHER): Payer: Medicare Other | Admitting: Internal Medicine

## 2012-12-06 VITALS — BP 162/84 | HR 55 | Temp 98.0°F | Ht 64.0 in | Wt 164.0 lb

## 2012-12-06 DIAGNOSIS — R05 Cough: Secondary | ICD-10-CM

## 2012-12-06 DIAGNOSIS — I10 Essential (primary) hypertension: Secondary | ICD-10-CM

## 2012-12-06 DIAGNOSIS — R059 Cough, unspecified: Secondary | ICD-10-CM

## 2012-12-06 MED ORDER — PANTOPRAZOLE SODIUM 40 MG PO TBEC: 40.0000 mg | DELAYED_RELEASE_TABLET | Freq: Every day | ORAL | Status: DC

## 2012-12-06 MED ORDER — NEBIVOLOL HCL 5 MG PO TABS: 5.0000 mg | ORAL_TABLET | Freq: Every day | ORAL | Status: DC

## 2012-12-06 NOTE — Progress Notes (Signed)
Subjective:    Patient ID: [name], male    DOB: Jun 07, 1928  MRN: [id number]  Brief patient profile:  57 yowm never smoker with pna as child completely healthy by teenage then indolent onset daily cough x around 2012 self referred  08/06/12 to pulmonary clinic.    HPI 08/06/2012 1st pulmonary eval/ [name]  cc daily cough x 2 years assoc some sinus problems x decade seems worse p wakes up and stirs around occurs before bfast  Prod white mucus thick and worse again at hs. No better with inhalers or otc cough meds, decongestants.  Sinus problems are all year and consistent of nasal congestion and clear drainage but not typically sneezing or itching.  rec Pantoprazole (protonix) 40 mg   Take 30-60 min before first meal of the day and Pepcid 20 mg one bedtime until return to office - this is the best way to tell whether stomach acid is contributing to your problem.   Prednisone 10 mg take  4 each am x 2 days,   2 each am x 2 days,  1 each am x 2 days and stop Please see patient coordinator before you leave today  to schedule sinus ct> min thickening GERD diet.    08/20/2012 f/u ov/[name] re cough Chief Complaint  Patient presents with  . Follow-up    Pt states cough is unchnanged since the last visit. No new co's today.   mucus white thick, no change pattern even when driving back and forth from [facility]. RAST pos  Cat/dog, neither in bedroom and cat is outdoor pet. rec Singulair 10 mg every evening  Add chlortrimeton 4 mg at bedtime  For cough mucinex dm max dose 1200 mg every 12 hours     10/25/2012 f/u ov/[name] re:  Cough/ POS MCT "nothing ever works, even prednisoneMuseum/gallery conservator Complaint  Patient presents with  . Follow-up    Here to disucss pos MCT. Breathing unchanged since his last visit.   cough present all through the day > thick white chokes him "points to mid neck" = tsp of white mucus at most, never discolored  Cough is immediately when lie down  Mouth tends to be dry, nose  tends to get clogged up, nose runs when eat x forever = decades  rec Dulera 100  Take 2 puffs first thing in am and then another 2 puffs about 12 hours later. bystolic 5 mg one daily   Stop singulair and also the chlortrimeton  dymista one twice daily should stop your nose from running Stay on acid suppression just as you are GERD diet      12/06/2012 f/u ov/[name] re: cough / Pos MCT Chief Complaint  Patient presents with  . Follow-up    Pt states breathing is doing well since he started sleeping on 7 inch wedge. Stopped using inhalers. No co's today.   No limited by breathing  No obvious daytime variabilty or assoc sob  or cp or chest tightness, subjective wheeze overt   hb symptoms. No unusual exp hx or h/o childhood   asthma or knowledge of premature birth.   Sleeping ok without nocturnal  or premature awakening due to am exacerbation  of respiratory  c/o's or need for noct saba. Also denies any obvious fluctuation of symptoms with weather or environmental changes or other aggravating or alleviating factors except as outlined above    Current Medications, Allergies, Past Medical History, Past Surgical History, Family History, and Social History were reviewed  in [employer] record.  ROS  The following are not active complaints unless bolded sore throat, dysphagia, dental problems, itching, sneezing,  nasal congestion or excess/ purulent secretions, ear ache,   fever, chills, sweats, unintended wt loss, pleuritic or exertional cp, hemoptysis,  orthopnea pnd or leg swelling, presyncope, palpitations, heartburn, abdominal pain, anorexia, nausea, vomiting, diarrhea  or change in bowel or urinary habits, change in stools or urine, dysuria,hematuria,  rash, arthralgias, visual complaints, headache, numbness weakness or ataxia or problems with walking or coordination,  change in mood/affect or memory.      Objective:   Physical Exam  08/20/2012         166 >  10/25/12   164  > 12/06/2012 164    08/06/12 166 lb (75.297 kg)  04/20/12 164 lb 12.8 oz (74.753 kg)  12/23/11 165 lb 12.8 oz (75.206 kg)     amb depressed appearing wm nad walks bent over   HEENT: nl dentition, turbinates, and orophanx. Nl external ear canals without cough reflex   NECK :  without JVD/Nodes/TM/ nl carotid upstrokes bilaterally   LUNGS: no acc muscle use, clear to A and P bilaterally without cough on insp or exp maneuvers   CV:  RRR  no s3 or murmur or increase in P2, no edema   ABD:  soft and nontender with nl excursion in the supine position. No bruits or organomegaly, bowel sounds nl  MS:  warm without deformities, calf tenderness, cyanosis or clubbing  SKIN: warm and dry without lesions          CXR  08/06/2012 :   1. No radiographic evidence of acute cardiopulmonary disease.  2. Atherosclerosis.     Assessment & Plan:

## 2012-12-06 NOTE — Patient Instructions (Addendum)
After the holidays try off protonix / pantoprazole to see what difference if it makes  Continue bystolic until you see [facility] and he can decide what medication you need for your blood pressure    If you are satisfied with your treatment plan let your doctor know and he/she can either refill your medications or you can return here when your prescription runs out.     If in any way you are not 100% satisfied,  please tell us.  If 100% better, tell your friends!

## 2012-12-07 ENCOUNTER — Ambulatory Visit: Payer: Medicare Other | Admitting: Internal Medicine

## 2012-12-07 NOTE — Assessment & Plan Note (Addendum)
-   Allergy profile 08/06/2012 >  No eos,  IgE 130  > POS cats and dogs - sinus ct 08/10/2012 > Minimal mucosal thickening of the maxillary sinuses without the airfluid levels. - trial of singulair and 1st gen h1 at hs started 08/20/2012 > stopped 10/25/2012 as no better  - MCT 10/11/12 > Pos at 1.0 but no cough  - hfa 75% 10/25/2012  > trial of dulera 100 and off lopressor - resolved 12/06/12 p elevation of HOB and off all inhalers  Although he clearly has asthma, as long as he treats gerd aggressively (esp including raising HOB, and oldie but goodie in terms of addressing non acid reflux)  and stays off non-specific beta blockers and limits his pet exposure, does not appear to be inhaler dep so ok to leave off for now and stay on present rx at least through the holidays

## 2012-12-07 NOTE — Assessment & Plan Note (Signed)
Adequate control on present rx, reviewed > no change in rx needed    Strongly prefer in this setting (positive methacholine challenge) : Bystolic, the most beta -1  selective Beta blocker available in sample form, with bisoprolol the most selective generic choice  on the market, but will defer rx to Dr Patty Sermons

## 2012-12-08 ENCOUNTER — Ambulatory Visit (HOSPITAL_COMMUNITY)
Admission: RE | Admit: 2012-12-08 | Discharge: 2012-12-08 | Disposition: A | Discharge status: Home / Self Care | Payer: Medicare Other | Source: Ambulatory Visit | Attending: Internal Medicine | Admitting: Internal Medicine

## 2012-12-08 DIAGNOSIS — M6281 Muscle weakness (generalized): Secondary | ICD-10-CM

## 2012-12-08 DIAGNOSIS — R262 Difficulty in walking, not elsewhere classified: Secondary | ICD-10-CM

## 2012-12-08 NOTE — Progress Notes (Signed)
Physical Therapy Treatment Patient Details  Name: [name] MRN: [id number] Date of Birth: [birth year]  Today's Date: 12/08/2012 Time: 4098-1191 PT Time Calculation (min): 50 min Charge:TE [id number]  Visit#: 4 of 10  Re-eval: 12/29/12 Assessment Diagnosis: Rt hip weakness Next MD Visit: Dr. Despina Hick - couple months  Authorization: Medicare  Authorization Time Period:    Authorization Visit#: 4 of 10   Subjective: Symptoms/Limitations Symptoms: Pain free, pt reported difficulty with sidelying exercises at home.   Pain Assessment Currently in Pain?: No/denies  Precautions/Restrictions  Precautions Precautions: Fall  Exercise/Treatments Aerobic Stationary Bike: Nustep level x 10 minutes hill level 3, resistance 3 Standing Heel Raises: 15 reps;Limitations Heel Raises Limitations: toe raises 15x Knee Flexion: 10 reps;Limitations Knee Flexion Limitations: BLE Functional Squat: 10 reps;Limitations Functional Squat Limitations: PT facilitation Other Standing Knee Exercises: side stepping with PT facilitatio to reduce ER Other Standing Knee Exercises: retro gait 1 RT Supine Bridges: Both;20 reps;Limitations Bridges Limitations: adduction with ball between knees Straight Leg Raises: Both;15 reps Sidelying Hip ABduction: AAROM;Right;10 reps Clams: 10x 10"     Physical Therapy Assessment and Plan PT Assessment and Plan Clinical Impression Statement: Session focus on gluteal strengthening to improve balance and gait mechanincs.  Pt continues to required AAROM with glut med exercises, pt educated on importance of proper postition with exercises to reduce compensation.   PT Plan: complete all activities in even shoes if pt brings them in. NuStep for LE strengthening, squats, Bil knee flexion, hip abduction if able.  Mat activities: iso hip adduction and hip abduction (with manual resistance or theraband), bridging, S/L clams, heel and toe raises seated.  COntinue to focus on  improve gluteus medius strength (add hip hikes when wable) and postural exercises to improve posture.  Progress towards gait activities.     Goals Home Exercise Program Pt/caregiver will Perform Home Exercise Program: For increased ROM;For increased strengthening;For improved balance;Independently PT Short Term Goals Time to Complete Short Term Goals: 2 weeks PT Short Term Goal 1: Pt will improve Rt hip abduction by to a 3+/5 in order to begin ambulating independently.  PT Short Term Goal 1 - Progress: Progressing toward goal PT Short Term Goal 2: Pt will improve his LE activitiy tolerance in order to ambualte x15 minutes without an AD with moderate gait impairments.  PT Short Term Goal 3: Pt will improve his proprioceptive awarness and demonstrate partial tandem stance x20 seconds on solid surface to decrease falls risk.  PT Long Term Goals Time to Complete Long Term Goals: 4 weeks PT Long Term Goal 1: Pt will improve his postural strength and flexibility to [facility] in order to maintain neutral spine postion in standing for 10 minutes to decrease risk of secondary injury.  PT Long Term Goal 2: Pt will improve his FOTO to status greater than 66% and limiations less than 44% for improve percieved functional ability.  Long Term Goal 3: Pt will improve his RLE strength to [facility] in order to ambulate with minimal trendelenberg gait impariments to reduce risk of secondary injury.   Problem List Patient Active Problem List   Diagnosis Date Noted  . Generalized muscle weakness 11/29/2012  . Difficulty in walking(719.7) 11/29/2012  . HBP (high blood pressure) 10/26/2012  . Cough 05/05/2011  . Osteoarthritis 07/15/2010  . Ischemic heart disease 07/15/2010  . Right bundle branch block 07/15/2010  . Chronic renal insufficiency 07/15/2010  . Benign hypertensive heart disease without heart failure 07/15/2010    PT - End of Session Activity  Tolerance: Patient tolerated treatment well;Patient limited by  fatigue General Behavior During Therapy: [facility] for tasks assessed/performed  GP    Juel Burrow 12/08/2012, 4:28 PM

## 2012-12-10 ENCOUNTER — Ambulatory Visit (HOSPITAL_COMMUNITY)
Admission: RE | Admit: 2012-12-10 | Discharge: 2012-12-10 | Disposition: A | Discharge status: Home / Self Care | Payer: Medicare Other | Source: Ambulatory Visit | Attending: Internal Medicine | Admitting: Internal Medicine

## 2012-12-10 DIAGNOSIS — R262 Difficulty in walking, not elsewhere classified: Secondary | ICD-10-CM

## 2012-12-10 DIAGNOSIS — M6281 Muscle weakness (generalized): Secondary | ICD-10-CM

## 2012-12-10 NOTE — Progress Notes (Signed)
Physical Therapy Treatment Patient Details  Name: [name] MRN: [id number] Date of Birth: 12-04-1928  Today's Date: 12/10/2012 Time: 1300-1352 PT Time Calculation (min): 52 min Charge:TE 1300-1352  Visit#: 5 of 10  Re-eval: 12/29/12 Assessment Diagnosis: Rt hip weakness Next MD Visit: Dr. Despina Hick - couple months  Authorization: Medicare  Authorization Time Period:    Authorization Visit#: 5 of 10   Subjective: Symptoms/Limitations Symptoms: Pain free, continues with complaince with HEP daily. Pain Assessment Currently in Pain?: No/denies  Precautions/Restrictions  Precautions Precautions: Fall  Exercise/Treatments Stretches Passive Hamstring Stretch: 3 reps;60 seconds;Limitations Passive Hamstring Stretch Limitations: with rope Aerobic Stationary Bike: Nustep level x 10 minutes hill level 3, resistance 3 Standing Rocker Board: 2 minutes;Limitations Rocker Board Limitations: max PT facilitatoin Other Standing Knee Exercises: side stepping with PT facilitatio to reduce ER Seated Other Seated Knee Exercises: heel rollout with towel between knees Supine Bridges: Both;20 reps;Limitations Bridges Limitations: adduction with ball between knees Other Supine Knee Exercises: hip abd with red theraband 10x 5" Sidelying Hip ABduction: AAROM;Right;10 reps Clams: 10x 10"    Physical Therapy Assessment and Plan PT Assessment and Plan Clinical Impression Statement: Session focus on improving gluteal strength to improve gait mechanics.  Completed therex without shoes this session.  Pt continued to required max PT facilitation with gait mechanics for correct musculature activation due to weak hip musculature, cueing to reduce compensation.   PT Plan: complete all activities in even shoes if pt brings them in. NuStep for LE strengthening, squats, Bil knee flexion, hip abduction if able.  Mat activities: iso hip adduction and hip abduction (with manual resistance or theraband),  bridging, S/L clams, heel and toe raises seated.  COntinue to focus on improve gluteus medius strength (add hip hikes when wable) and postural exercises to improve posture.  Progress towards gait activities.     Goals Home Exercise Program Pt/caregiver will Perform Home Exercise Program: For increased ROM;For increased strengthening;For improved balance;Independently PT Short Term Goals Time to Complete Short Term Goals: 2 weeks PT Short Term Goal 1: Pt will improve Rt hip abduction by to a 3+/5 in order to begin ambulating independently.  PT Short Term Goal 1 - Progress: Progressing toward goal PT Short Term Goal 2: Pt will improve his LE activitiy tolerance in order to ambualte x15 minutes without an AD with moderate gait impairments.  PT Short Term Goal 3: Pt will improve his proprioceptive awarness and demonstrate partial tandem stance x20 seconds on solid surface to decrease falls risk.  PT Long Term Goals Time to Complete Long Term Goals: 4 weeks PT Long Term Goal 1: Pt will improve his postural strength and flexibility to [facility] in order to maintain neutral spine postion in standing for 10 minutes to decrease risk of secondary injury.  PT Long Term Goal 2: Pt will improve his FOTO to status greater than 66% and limiations less than 44% for improve percieved functional ability.  Long Term Goal 3: Pt will improve his RLE strength to [facility] in order to ambulate with minimal trendelenberg gait impariments to reduce risk of secondary injury.   Problem List Patient Active Problem List   Diagnosis Date Noted  . Generalized muscle weakness 11/29/2012  . Difficulty in walking(719.7) 11/29/2012  . HBP (high blood pressure) 10/26/2012  . Cough 05/05/2011  . Osteoarthritis 07/15/2010  . Ischemic heart disease 07/15/2010  . Right bundle branch block 07/15/2010  . Chronic renal insufficiency 07/15/2010  . Benign hypertensive heart disease without heart failure 07/15/2010  PT - End of  Session Equipment Utilized During Treatment: Gait belt Activity Tolerance: Patient tolerated treatment well;Patient limited by fatigue General Behavior During Therapy: [facility] for tasks assessed/performed  GP    Juel Burrow 12/10/2012, 2:07 PM

## 2012-12-13 ENCOUNTER — Ambulatory Visit (HOSPITAL_COMMUNITY)
Admission: RE | Admit: 2012-12-13 | Discharge: 2012-12-13 | Disposition: A | Discharge status: Home / Self Care | Payer: Medicare Other | Source: Ambulatory Visit | Attending: Internal Medicine | Admitting: Internal Medicine

## 2012-12-13 DIAGNOSIS — M6281 Muscle weakness (generalized): Secondary | ICD-10-CM

## 2012-12-13 DIAGNOSIS — R262 Difficulty in walking, not elsewhere classified: Secondary | ICD-10-CM

## 2012-12-13 NOTE — Progress Notes (Signed)
Physical Therapy Treatment Patient Details  Name: [name] MRN: [id number] Date of Birth: 11-04-1928  Today's Date: 12/13/2012 Time: 1300- 1345 Charges: TE: 1300-1315 NMR: 1315-1345    Visit#: 6 of 10  Re-eval: 12/29/12    Authorization: Medicare  Authorization Time Period:    Authorization Visit#: 6 of 10   Subjective: Symptoms/Limitations Symptoms: Reports difficulty with doing his hip abduction s/l at home.  Pain Assessment Currently in Pain?: No/denies  Exercise/Treatments Standing Heel Raises: 15 reps;Limitations Heel Raises Limitations: w/o HHA, w/min A  Lateral Step Up: 10 reps;Hand Hold: 1;Step Height: 2";Limitations;Right Lateral Step Up Limitations: Min A Forward Step Up: Right;15 reps;Hand Hold: 0;Step Height: 2";Limitations Forward Step Up Limitations: min A Functional Squat: 15 reps Functional Squat Limitations: PT facilitation Other Standing Knee Exercises: hip Hikes 2 in step x10 reps w/PT facilaition and use of mirror Standing Standing Eyes Opened: Narrow base of support (BOS);Solid surface;Limitations;Time Standing Eyes Opened Time: 3x3 minutes  Standing Eyes Opened Limitations: with shoulder flexion 3x10 reps w/mirror Other Standing Exercises: weight shifting: S<>S x1 minute w/min A and mirror Other Standing Exercises: Red theraband: Shoulder extension BUE x10, Rows x10 reps BUE, adduction x10 reps BUE w/min A w/ narrow BOS   Physical Therapy Assessment and Plan PT Assessment and Plan Clinical Impression Statement: Added stair and hip hikes to improve gluteal medius strength and added standing theraband exercises to improve balance and posture.  Complete all exercises without shoes and used a mirror to demonstrate height of iliac crests in static standing.  Pt continues to have a significant Lt trenlenberg gait with forward flexion and requries max cueing to improve posture with gait.  PT Plan: isometric hip abduction  Goals    Problem  List Patient Active Problem List   Diagnosis Date Noted  . Generalized muscle weakness 11/29/2012  . Difficulty in walking(719.7) 11/29/2012  . HBP (high blood pressure) 10/26/2012  . Cough 05/05/2011  . Osteoarthritis 07/15/2010  . Ischemic heart disease 07/15/2010  . Right bundle branch block 07/15/2010  . Chronic renal insufficiency 07/15/2010  . Benign hypertensive heart disease without heart failure 07/15/2010    PT - End of Session Equipment Utilized During Treatment: Gait belt Activity Tolerance: Patient tolerated treatment well;Patient limited by fatigue General Behavior During Therapy: [facility] for tasks assessed/performed  GP    Conner Neiss, MPT, ATC 12/13/2012, 2:13 PM

## 2012-12-20 ENCOUNTER — Ambulatory Visit (HOSPITAL_COMMUNITY): Payer: Medicare Other | Admitting: Physical Therapy

## 2012-12-20 ENCOUNTER — Ambulatory Visit (HOSPITAL_COMMUNITY): Payer: Medicare Other

## 2012-12-22 ENCOUNTER — Ambulatory Visit (HOSPITAL_COMMUNITY): Payer: Medicare Other | Admitting: Physical Therapy

## 2012-12-22 ENCOUNTER — Ambulatory Visit (HOSPITAL_COMMUNITY): Payer: Medicare Other

## 2012-12-23 ENCOUNTER — Ambulatory Visit (HOSPITAL_COMMUNITY)
Admission: RE | Admit: 2012-12-23 | Discharge: 2012-12-23 | Disposition: A | Discharge status: Home / Self Care | Payer: Medicare Other | Source: Ambulatory Visit | Attending: Orthopedic Surgery | Admitting: Orthopedic Surgery

## 2012-12-23 ENCOUNTER — Ambulatory Visit (HOSPITAL_COMMUNITY): Payer: Medicare Other | Admitting: Physical Therapy

## 2012-12-23 DIAGNOSIS — R269 Unspecified abnormalities of gait and mobility: Secondary | ICD-10-CM | POA: Insufficient documentation

## 2012-12-23 DIAGNOSIS — M6281 Muscle weakness (generalized): Secondary | ICD-10-CM | POA: Insufficient documentation

## 2012-12-23 DIAGNOSIS — IMO0001 Reserved for inherently not codable concepts without codable children: Secondary | ICD-10-CM | POA: Insufficient documentation

## 2012-12-23 DIAGNOSIS — M25559 Pain in unspecified hip: Secondary | ICD-10-CM | POA: Insufficient documentation

## 2012-12-23 DIAGNOSIS — R262 Difficulty in walking, not elsewhere classified: Secondary | ICD-10-CM | POA: Insufficient documentation

## 2012-12-23 NOTE — Progress Notes (Signed)
Physical Therapy Treatment Patient Details  Name: [name] MRN: [id number] Date of Birth: 1928/08/27  Today's Date: 12/23/2012 Time: 0930-1025 PT Time Calculation (min): 55 min Charge:TE 4098-1191  Visit#: 7 of 10  Re-eval: 12/29/12 Assessment Diagnosis: Rt hip weakness Next MD Visit: Dr. Despina Hick - couple months  Authorization: Medicare  Authorization Time Period:    Authorization Visit#: 7 of 10   Subjective: Symptoms/Limitations Symptoms: Pt reports difficulty with rolling in bed and complieteing abduction at home.   Pain Assessment Currently in Pain?: No/denies  Precautions/Restrictions  Precautions Precautions: Fall  Exercise/Treatments Standing Lateral Step Up: 10 reps;Hand Hold: 1;Step Height: 2";Limitations;Right Lateral Step Up Limitations: Min A Forward Step Up: Right;15 reps;Hand Hold: 0;Limitations;Step Height: 4" Functional Squat: 15 reps Functional Squat Limitations: PT facilitation Other Standing Knee Exercises: hip Hikes 2 in step x10 reps w/PT facilaition and use of mirror Supine Other Supine Knee Exercises: isometric hip abduction 10x 5" Bil LE Sidelying Hip ABduction: AAROM;Right;10 reps Clams: 10x 10" Other Sidelying Knee Exercises: AAROM reverse clam 5x each LE Balance Exercises Standing Other Standing Exercises: weight shifting: S<>S x1 minute w/min A and mirror Other Standing Exercises: Red theraband: Shoulder extension BUE x10, Rows x10 reps BUE, adduction x10 reps BUE w/min A w/ narrow BOS   Physical Therapy Assessment and Plan PT Assessment and Plan Clinical Impression Statement: Pt instructed proper techniques with rolling in bed to increase functional ease.  Added isometric hip abduction for gluteal medius strengthening and continued with postural strengthening exercises.  PT facilitation required to with techniques for proper musculature activation with exercise. PT Plan: complete all activities in even shoes if pt brings them in.  Continue with balance activities next visit: cone rotations and numbers.  Continue to attempt s/l hip abduction, and encourage proper posture with gait.     Goals Home Exercise Program Pt/caregiver will Perform Home Exercise Program: For increased ROM;For increased strengthening;For improved balance;Independently PT Short Term Goals Time to Complete Short Term Goals: 2 weeks PT Short Term Goal 1: Pt will improve Rt hip abduction by to a 3+/5 in order to begin ambulating independently.  PT Short Term Goal 1 - Progress: Progressing toward goal PT Short Term Goal 2: Pt will improve his LE activitiy tolerance in order to ambualte x15 minutes without an AD with moderate gait impairments.  PT Short Term Goal 2 - Progress: Progressing toward goal PT Short Term Goal 3: Pt will improve his proprioceptive awarness and demonstrate partial tandem stance x20 seconds on solid surface to decrease falls risk.  PT Short Term Goal 3 - Progress: Progressing toward goal PT Long Term Goals Time to Complete Long Term Goals: 4 weeks PT Long Term Goal 1: Pt will improve his postural strength and flexibility to [facility] in order to maintain neutral spine postion in standing for 10 minutes to decrease risk of secondary injury.  PT Long Term Goal 1 - Progress: Progressing toward goal PT Long Term Goal 2: Pt will improve his FOTO to status greater than 66% and limiations less than 44% for improve percieved functional ability.  Long Term Goal 3: Pt will improve his RLE strength to [facility] in order to ambulate with minimal trendelenberg gait impariments to reduce risk of secondary injury.   Problem List Patient Active Problem List   Diagnosis Date Noted  . Generalized muscle weakness 11/29/2012  . Difficulty in walking(719.7) 11/29/2012  . HBP (high blood pressure) 10/26/2012  . Cough 05/05/2011  . Osteoarthritis 07/15/2010  . Ischemic heart disease 07/15/2010  .  Right bundle branch block 07/15/2010  . Chronic renal  insufficiency 07/15/2010  . Benign hypertensive heart disease without heart failure 07/15/2010    PT - End of Session Equipment Utilized During Treatment: Gait belt Activity Tolerance: Patient tolerated treatment well;Patient limited by fatigue General Behavior During Therapy: [facility] for tasks assessed/performed  GP    Juel Burrow 12/23/2012, 10:56 AM

## 2012-12-24 ENCOUNTER — Ambulatory Visit (HOSPITAL_COMMUNITY): Payer: Medicare Other | Admitting: Physical Therapy

## 2012-12-29 ENCOUNTER — Ambulatory Visit (HOSPITAL_COMMUNITY)
Admission: RE | Admit: 2012-12-29 | Discharge: 2012-12-29 | Disposition: A | Discharge status: Home / Self Care | Payer: Medicare Other | Source: Ambulatory Visit | Attending: Internal Medicine | Admitting: Internal Medicine

## 2012-12-29 DIAGNOSIS — M6281 Muscle weakness (generalized): Secondary | ICD-10-CM

## 2012-12-29 DIAGNOSIS — R262 Difficulty in walking, not elsewhere classified: Secondary | ICD-10-CM

## 2012-12-29 NOTE — Progress Notes (Signed)
Physical Therapy Re-evaluation/Treatment  Patient Details  Name: [name] MRN: [id number] Date of Birth: 07-15-1928  Today's Date: 12/29/2012 Time: 1110-1210 PT Time Calculation (min): 60 min Charge:MMT 1110-1125, TE 1125-1200, Self care 1200-1210              Visit#: 8 of 16  Re-eval: 01/27/12 Assessment Diagnosis: Rt hip weakness Next MD Visit: Dr. Despina Hick - next week  Authorization: Medicare    Authorization Time Period:    Authorization Visit#: 8 of 10   Subjective Symptoms/Limitations Symptoms: Pt reports he is getting agrgevated with the shoe heights, no reports of pain. How long can you walk comfortably?: 15 minutes was 10 minutes Pain Assessment Currently in Pain?: No/denies  Precautions/Restrictions  Precautions Precautions: Fall  Sensation/Coordination/Flexibility/Functional Tests Functional Tests Functional Tests: FOTO: 58/48 (FOTO was: 66/34)  Observation: 1/4in leg distripency  Assessment RLE Strength RLE Overall Strength Comments: Hip IR: 2-/5, Hip ER: 2+/5 (was Hip IR: 2/5, Hip ER: 2/5) Right Hip Flexion: 4/5 (was 4/5) Right Hip Extension: 3-/5 (was 3/5) Right Hip ABduction: 2+/5 (was 2/5) Right Hip ADduction: 3/5 (was 2+/5) Right Knee Flexion: 5/5 Right Knee Extension: 5/5 LLE AROM (degrees) Left Knee Extension: 8 (was 10) Left Knee Flexion: 120 LLE Strength Left Hip Flexion: 5/5 (was 5/5) Left Hip Extension: 3/5 (was 3+/5) Left Hip ABduction: 4/5 (was 4/5) Left Hip ADduction: 4/5 (was 3+/5) Left Knee Flexion: 5/5 Left Knee Extension: 5/5 Lumbar Assessment Lumbar Assessment: Exceptions to [facility] Lumbar AROM Overall Lumbar AROM Comments: lumbar extension at neutral following cueing to improve posture  Mobility/Balance  Ambulation/Gait Ambulation/Gait: Yes Assistive device: Crutches (1 crutch) Gait Pattern: Antalgic;Lateral hip instability Posture/Postural Control Posture/Postural Control: Postural limitations Static Standing  Balance Single Leg Stance - Right Leg: 0 Single Leg Stance - Left Leg: 0 Tandem Stance - Right Leg: 0 Tandem Stance - Left Leg: 0 Rhomberg - Eyes Opened: 10 Rhomberg - Eyes Closed: 5   Exercise/Treatments Standing SLS: SLS 3x 30" with HHA Supine Bridges: Both;20 reps;Limitations Bridges Limitations: adduction with ball between knees Other Supine Knee Exercises: isometric hip abduction against wall 10x 10"    Physical Therapy Assessment and Plan PT Assessment and Plan Clinical Impression Statement: Re-eval complete with the following findings:  Pt has had 8 OPPT sessions and reports compliance with HEP daily.  Pt continues to have significant postural, Bil LE strength and balance weakness, antalgic gait mechanics and depends on crutches with gait.  Pt with decreased FOTO status due to these weaknesses. Pt will continue to benefit from skilled PT intervention to improve strength, balance and gait mechanics.  Pt has been given updated HEP to address most hip and gluteal strengthening and theraband to improve posture, able to demonstrate appropriate technqiue with these exercises following cueing for form.   PT Plan: F/U with MD next week for continue vs d/c PT.  Recommend continuing OPPT sessions for 4 more weeks to address unmet goals.  complete all activities in even shoes if pt brings them in. Continue with balance activities next visit: cone rotations and numbers.  Continue to attempt s/l hip abduction, and encourage proper posture with gait.     Goals Home Exercise Program Pt/caregiver will Perform Home Exercise Program: For increased ROM;For increased strengthening;For improved balance;Independently PT Goal: Perform Home Exercise Program - Progress: Progressing toward goal (15 minutes every day) PT Short Term Goals Time to Complete Short Term Goals: 2 weeks PT Short Term Goal 1: Pt will improve Rt hip abduction by to a 3+/5 in  order to begin ambulating independently.  Progressing  toward goal PT Short Term Goal 2: Pt will improve his LE activitiy tolerance in order to ambualte x15 minutes without an AD with moderate gait impairments. Progressing toward goal PT Short Term Goal 3: Pt will improve his proprioceptive awarness and demonstrate partial tandem stance x20 seconds on solid surface to decrease falls risk. Progressing toward goal PT Long Term Goals Time to Complete Long Term Goals: 4 weeks PT Long Term Goal 1: Pt will improve his postural strength and flexibility to [facility] in order to maintain neutral spine postion in standing for 10 minutes to decrease risk of secondary injury.  Partly met PT Long Term Goal 2: Pt will improve his FOTO to status greater than 66% and limiations less than 44% for improve percieved functional ability. : Not met Long Term Goal 3: Pt will improve his RLE strength to [facility] in order to ambulate with minimal trendelenberg gait impariments to reduce risk of secondary injury. Progressing toward goal  Problem List Patient Active Problem List   Diagnosis Date Noted  . Generalized muscle weakness 11/29/2012  . Difficulty in walking(719.7) 11/29/2012  . HBP (high blood pressure) 10/26/2012  . Cough 05/05/2011  . Osteoarthritis 07/15/2010  . Ischemic heart disease 07/15/2010  . Right bundle branch block 07/15/2010  . Chronic renal insufficiency 07/15/2010  . Benign hypertensive heart disease without heart failure 07/15/2010    PT - End of Session Equipment Utilized During Treatment: Gait belt Activity Tolerance: Patient tolerated treatment well;Patient limited by fatigue General Behavior During Therapy: [facility] for tasks assessed/performed PT Plan of Care PT Home Exercise Plan: given advanced HEP  GP Functional Assessment Tool Used: FOTO: 66/34 Functional Limitation: Mobility: Walking and moving around Mobility: Walking and Moving Around Current Status (F6213): At least 40 percent but less than 60 percent impaired, limited or  restricted Mobility: Walking and Moving Around Goal Status [id number]): At least 20 percent but less than 40 percent impaired, limited or restricted  Juel Burrow; Annett Fabian, MPT, ATC 12/29/2012, 12:31 PM  Physician Documentation Your signature is required to indicate approval of the treatment plan as stated above.  Please sign and either send electronically or make a copy of this report for your files and return this physician signed original.   Please mark one 1.__approve of plan  2. ___approve of plan with the following conditions.   ______________________________                                                          _____________________ Physician Signature                                                                                                             Date

## 2013-02-01 ENCOUNTER — Ambulatory Visit (INDEPENDENT_AMBULATORY_CARE_PROVIDER_SITE_OTHER): Payer: Medicare Other | Admitting: Cardiology

## 2013-02-01 ENCOUNTER — Encounter: Payer: Self-pay | Admitting: Cardiology

## 2013-02-01 VITALS — BP 144/82 | HR 68 | Ht 63.0 in | Wt 161.0 lb

## 2013-02-01 DIAGNOSIS — R262 Difficulty in walking, not elsewhere classified: Secondary | ICD-10-CM

## 2013-02-01 DIAGNOSIS — I10 Essential (primary) hypertension: Secondary | ICD-10-CM

## 2013-02-01 DIAGNOSIS — N189 Chronic kidney disease, unspecified: Secondary | ICD-10-CM

## 2013-02-01 DIAGNOSIS — M109 Gout, unspecified: Secondary | ICD-10-CM

## 2013-02-01 DIAGNOSIS — I259 Chronic ischemic heart disease, unspecified: Secondary | ICD-10-CM

## 2013-02-01 DIAGNOSIS — I119 Hypertensive heart disease without heart failure: Secondary | ICD-10-CM

## 2013-02-01 LAB — HEPATIC FUNCTION PANEL
ALK PHOS: 97 U/L (ref 39–117)
ALT: 17 U/L (ref 0–53)
AST: 18 U/L (ref 0–37)
Albumin: 3.8 g/dL (ref 3.5–5.2)
BILIRUBIN DIRECT: 0.1 mg/dL (ref 0.0–0.3)
Total Bilirubin: 0.7 mg/dL (ref 0.3–1.2)
Total Protein: 6.6 g/dL (ref 6.0–8.3)

## 2013-02-01 LAB — BASIC METABOLIC PANEL
BUN: 72 mg/dL — AB (ref 6–23)
CO2: 25 mEq/L (ref 19–32)
CREATININE: 3.5 mg/dL — AB (ref 0.4–1.5)
Calcium: 8.9 mg/dL (ref 8.4–10.5)
Chloride: 109 mEq/L (ref 96–112)
GFR: 17.85 mL/min — AB (ref >60.00)
Glucose, Bld: 101 mg/dL — ABNORMAL HIGH (ref 70–99)
Potassium: 4.4 mEq/L (ref 3.5–5.1)
Sodium: 142 mEq/L (ref 135–145)

## 2013-02-01 LAB — LIPID PANEL
CHOLESTEROL: 158 mg/dL (ref 0–200)
HDL: 53.2 mg/dL (ref >39.00)
LDL Cholesterol: 95 mg/dL (ref 0–99)
Total CHOL/HDL Ratio: 3
Triglycerides: 50 mg/dL (ref 0.0–149.0)
VLDL: 10 mg/dL (ref 0.0–40.0)

## 2013-02-01 LAB — URIC ACID: URIC ACID, SERUM: 6.4 mg/dL (ref 4.0–7.8)

## 2013-02-01 NOTE — Patient Instructions (Addendum)
Will obtain labs today and call you with the results (LP/BMET/HFP/URIC ACID)  Your physician recommends that you continue on your current medications as directed. Please refer to the Current Medication list given to you today.  Your physician wants you to follow-up in: 4 months with fasting labs (lp/bmet/hfp)  You will receive a reminder letter in the mail two months in advance. If you don't receive a letter, please call our office to schedule the follow-up appointment.

## 2013-02-01 NOTE — Progress Notes (Signed)
[name] Date of Birth:  10-11-1928 [address] [place], [place]  [address] [phone]         Fax   [phone]  History of Present Illness: This pleasant 80 year old gentleman is seen for a  followup office visit. He has a past history of the large anterior wall myocardial infarction with late presentation.  That was about 5 years ago.  The patient has a history of high blood pressure and chronic kidney disease. His last ejection fraction by echo in March 2011 was 45-50%. His last nuclear stress test in March 2011 showed a large area of infarct with only minimal peri-infarct ischemia and an ejection fraction of 27%. He is managed medically. He has not been experiencing any chest pain but has had some exertional dyspnea which is chronic. He recently developed worsening peripheral edema after we increased his amlodipine from 5 mg up to 10 mg daily. His edema has improved since his PCP Dr. Delphina Cahill switched him from amlodipine to hydralazine.  The patient has been troubled with a bothersome cough.  His cough has resolved since he started sleeping with a wedge. The patient has a chronic nonproductive cough and has been seeing Dr. Melvyn Novas. The patient has a history of occasional gout and had an episode recently which lasted about a week and responded to colchicine.  The patient remains on allopurinol.   Current Outpatient Prescriptions  Medication Sig Dispense Refill  . allopurinol (ZYLOPRIM) 100 MG tablet Take 100 mg by mouth as directed. 1/2 tablet daily      . aspirin 81 MG tablet Take 81 mg by mouth every other day.        . clopidogrel (PLAVIX) 75 MG tablet Take 1 tablet (75 mg total) by mouth daily.  90 tablet  3  . hydrALAZINE (APRESOLINE) 50 MG tablet Take 50 mg by mouth 3 (three) times daily.      . metoprolol tartrate (LOPRESSOR) 25 MG tablet Take 25 mg by mouth 2 (two) times daily. 1/2 TAB      . nitroGLYCERIN (NITROSTAT) 0.4 MG SL tablet Place 1 tablet (0.4 mg  total) under the tongue every 5 (five) minutes as needed.  25 tablet  5  . PROAIR HFA 108 (90 BASE) MCG/ACT inhaler USE 2 PUFFS EVERY 6 HOURS AS NEEDED  8.5 g  1  . famotidine (PEPCID) 20 MG tablet One at bedtime  30 tablet  2  . nebivolol (BYSTOLIC) 5 MG tablet Take 1 tablet (5 mg total) by mouth daily.  30 tablet  2   No current facility-administered medications for this visit.    Allergies  Allergen Reactions  . Crestor [Rosuvastatin Calcium]     MUSCLE PAIN  . Gentamycin [Gentamicin]     Decreased kidney fx  . Hctz [Hydrochlorothiazide]     dizziness  . Lipitor [Atorvastatin Calcium]     MUSCLE PAIN    Patient Active Problem List   Diagnosis Date Noted  . Generalized muscle weakness 11/29/2012  . Difficulty in walking(719.7) 11/29/2012  . HBP (high blood pressure) 10/26/2012  . Cough 05/05/2011  . Osteoarthritis 07/15/2010  . Ischemic heart disease 07/15/2010  . Right bundle branch block 07/15/2010  . Chronic renal insufficiency 07/15/2010  . Benign hypertensive heart disease without heart failure 07/15/2010    History  Smoking status  . Never Smoker   Smokeless tobacco  . Never Used    History  Alcohol Use No    Family History  Problem Relation Age of Onset  . Coronary artery disease Mother   . Emphysema Father     never smoked, worked as a Psychologist, sport and exercise    Review of Systems: Constitutional: no fever chills diaphoresis or fatigue or change in weight.  Head and neck: no hearing loss, no epistaxis, no photophobia or visual disturbance. Respiratory: No cough, shortness of breath or wheezing. Cardiovascular: No chest pain peripheral edema, palpitations. Gastrointestinal: No abdominal distention, no abdominal pain, no change in bowel habits hematochezia or melena. Genitourinary: No dysuria, no frequency, no urgency, no nocturia. Musculoskeletal:No arthralgias, no back pain, no gait disturbance or myalgias. Neurological: No dizziness, no headaches, no numbness, no  seizures, no syncope, no weakness, no tremors. Hematologic: No lymphadenopathy, no easy bruising. Psychiatric: No confusion, no hallucinations, no sleep disturbance.    Physical Exam: Filed Vitals:   02/01/13 1005  BP: 144/82  Pulse: 68   repeat blood pressure is 136/78 right arm sitting relaxed.The head and neck exam reveals pupils equal and reactive.  Extraocular movements are full.  There is no scleral icterus.  The mouth and pharynx are normal.  The neck is supple.  The carotids reveal no bruits.  The jugular venous pressure is normal.  The  thyroid is not enlarged.  There is no lymphadenopathy.  The chest is clear to percussion and auscultation.  There are no rales or rhonchi.  Expansion of the chest is symmetrical.  The precordium is quiet.  The first heart sound is normal.  The second heart sound is physiologically split.  There is no murmur gallop rub or click.  There is no abnormal lift or heave.  The abdomen is soft and nontender.  The bowel sounds are normal.  The liver and spleen are not enlarged.  There are no abdominal masses.  There are no abdominal bruits.  Extremities reveal good pedal pulses.  There is no phlebitis or edema.  There is no cyanosis or clubbing.  Strength is decreased on right side and the patient walks with a crutch .There are no sensory deficits.  The skin is warm and dry.  There is no rash.     Assessment / Plan: Overall the patient is stable from the cardiovascular standpoint.  He will monitor his blood pressure at home.  Continue same medication.  Recheck in 4 months for followup office visit, EKG, lipid panel hepatic function panel and basal metabolic panel.  Blood work today pending

## 2013-02-01 NOTE — Assessment & Plan Note (Signed)
Patient has a history of chronic renal insufficiency.  We are checking lab work today

## 2013-02-01 NOTE — Assessment & Plan Note (Signed)
Blood pressure has been stable on hydralazine and beta blocker.  No dizziness or syncope

## 2013-02-01 NOTE — Assessment & Plan Note (Signed)
The patient has not been experiencing any recurrent chest pain or angina.  Because of cost his wife has switched him back from Bystolic to metoprolol and he has been doing well.

## 2013-02-01 NOTE — Assessment & Plan Note (Signed)
The patient has a lot of difficulty in walking because of severe osteoarthritis.  He does not anticipate any further surgery.

## 2013-02-02 NOTE — Progress Notes (Signed)
Quick Note:  Please report to patient. The recent labs are stable. Continue same medication and careful diet. BUN is higher. Drink plenty of water ______

## 2013-02-26 ENCOUNTER — Inpatient Hospital Stay (HOSPITAL_COMMUNITY)
Admission: EM | Admit: 2013-02-26 | Discharge: 2013-03-01 | DRG: 280 | Disposition: A | Discharge status: Home / Self Care | Payer: Medicare Other | Attending: Cardiology | Admitting: Cardiology

## 2013-02-26 ENCOUNTER — Emergency Department (HOSPITAL_COMMUNITY): Payer: Medicare Other

## 2013-02-26 ENCOUNTER — Emergency Department (HOSPITAL_COMMUNITY)
Admission: EM | Admit: 2013-02-26 | Discharge: 2013-02-26 | Disposition: A | Discharge status: Hospice | Payer: Medicare Other | Source: Home / Self Care | Attending: Family Medicine | Admitting: Family Medicine

## 2013-02-26 ENCOUNTER — Encounter (HOSPITAL_COMMUNITY): Payer: Self-pay | Admitting: Emergency Medicine

## 2013-02-26 DIAGNOSIS — Z96659 Presence of unspecified artificial knee joint: Secondary | ICD-10-CM

## 2013-02-26 DIAGNOSIS — R7989 Other specified abnormal findings of blood chemistry: Secondary | ICD-10-CM

## 2013-02-26 DIAGNOSIS — N289 Disorder of kidney and ureter, unspecified: Secondary | ICD-10-CM

## 2013-02-26 DIAGNOSIS — I513 Intracardiac thrombosis, not elsewhere classified: Secondary | ICD-10-CM

## 2013-02-26 DIAGNOSIS — Z836 Family history of other diseases of the respiratory system: Secondary | ICD-10-CM

## 2013-02-26 DIAGNOSIS — I119 Hypertensive heart disease without heart failure: Secondary | ICD-10-CM

## 2013-02-26 DIAGNOSIS — I129 Hypertensive chronic kidney disease with stage 1 through stage 4 chronic kidney disease, or unspecified chronic kidney disease: Secondary | ICD-10-CM | POA: Diagnosis present

## 2013-02-26 DIAGNOSIS — J1100 Influenza due to unidentified influenza virus with unspecified type of pneumonia: Secondary | ICD-10-CM | POA: Diagnosis not present

## 2013-02-26 DIAGNOSIS — D631 Anemia in chronic kidney disease: Secondary | ICD-10-CM | POA: Diagnosis present

## 2013-02-26 DIAGNOSIS — I10 Essential (primary) hypertension: Secondary | ICD-10-CM

## 2013-02-26 DIAGNOSIS — I255 Ischemic cardiomyopathy: Secondary | ICD-10-CM | POA: Diagnosis present

## 2013-02-26 DIAGNOSIS — B029 Zoster without complications: Secondary | ICD-10-CM | POA: Diagnosis present

## 2013-02-26 DIAGNOSIS — I5043 Acute on chronic combined systolic (congestive) and diastolic (congestive) heart failure: Principal | ICD-10-CM

## 2013-02-26 DIAGNOSIS — I160 Hypertensive urgency: Secondary | ICD-10-CM

## 2013-02-26 DIAGNOSIS — I2589 Other forms of chronic ischemic heart disease: Secondary | ICD-10-CM | POA: Diagnosis present

## 2013-02-26 DIAGNOSIS — I509 Heart failure, unspecified: Secondary | ICD-10-CM

## 2013-02-26 DIAGNOSIS — I259 Chronic ischemic heart disease, unspecified: Secondary | ICD-10-CM

## 2013-02-26 DIAGNOSIS — Z888 Allergy status to other drugs, medicaments and biological substances status: Secondary | ICD-10-CM

## 2013-02-26 DIAGNOSIS — I214 Non-ST elevation (NSTEMI) myocardial infarction: Secondary | ICD-10-CM

## 2013-02-26 DIAGNOSIS — M1710 Unilateral primary osteoarthritis, unspecified knee: Secondary | ICD-10-CM | POA: Diagnosis present

## 2013-02-26 DIAGNOSIS — N184 Chronic kidney disease, stage 4 (severe): Secondary | ICD-10-CM

## 2013-02-26 DIAGNOSIS — I2510 Atherosclerotic heart disease of native coronary artery without angina pectoris: Secondary | ICD-10-CM

## 2013-02-26 DIAGNOSIS — N039 Chronic nephritic syndrome with unspecified morphologic changes: Secondary | ICD-10-CM

## 2013-02-26 DIAGNOSIS — R05 Cough: Secondary | ICD-10-CM

## 2013-02-26 DIAGNOSIS — I5023 Acute on chronic systolic (congestive) heart failure: Secondary | ICD-10-CM

## 2013-02-26 DIAGNOSIS — I252 Old myocardial infarction: Secondary | ICD-10-CM

## 2013-02-26 DIAGNOSIS — N189 Chronic kidney disease, unspecified: Secondary | ICD-10-CM

## 2013-02-26 DIAGNOSIS — R059 Cough, unspecified: Secondary | ICD-10-CM

## 2013-02-26 DIAGNOSIS — J9600 Acute respiratory failure, unspecified whether with hypoxia or hypercapnia: Secondary | ICD-10-CM | POA: Diagnosis not present

## 2013-02-26 DIAGNOSIS — I4510 Unspecified right bundle-branch block: Secondary | ICD-10-CM

## 2013-02-26 DIAGNOSIS — N400 Enlarged prostate without lower urinary tract symptoms: Secondary | ICD-10-CM | POA: Diagnosis present

## 2013-02-26 DIAGNOSIS — Z7982 Long term (current) use of aspirin: Secondary | ICD-10-CM

## 2013-02-26 DIAGNOSIS — M109 Gout, unspecified: Secondary | ICD-10-CM | POA: Diagnosis present

## 2013-02-26 DIAGNOSIS — J45909 Unspecified asthma, uncomplicated: Secondary | ICD-10-CM | POA: Diagnosis present

## 2013-02-26 DIAGNOSIS — Z23 Encounter for immunization: Secondary | ICD-10-CM

## 2013-02-26 DIAGNOSIS — Z8249 Family history of ischemic heart disease and other diseases of the circulatory system: Secondary | ICD-10-CM

## 2013-02-26 DIAGNOSIS — R778 Other specified abnormalities of plasma proteins: Secondary | ICD-10-CM

## 2013-02-26 DIAGNOSIS — N179 Acute kidney failure, unspecified: Secondary | ICD-10-CM | POA: Diagnosis present

## 2013-02-26 DIAGNOSIS — N185 Chronic kidney disease, stage 5: Secondary | ICD-10-CM | POA: Diagnosis present

## 2013-02-26 LAB — URINALYSIS, ROUTINE W REFLEX MICROSCOPIC
BILIRUBIN URINE: NEGATIVE
Glucose, UA: NEGATIVE mg/dL
Ketones, ur: NEGATIVE mg/dL
LEUKOCYTES UA: NEGATIVE
NITRITE: NEGATIVE
PH: 6 (ref 5.0–8.0)
Protein, ur: 100 mg/dL — AB
SPECIFIC GRAVITY, URINE: 1.009 (ref 1.005–1.030)
UROBILINOGEN UA: 0.2 mg/dL (ref 0.0–1.0)

## 2013-02-26 LAB — COMPREHENSIVE METABOLIC PANEL
ALBUMIN: 3.2 g/dL — AB (ref 3.5–5.2)
ALT: 28 U/L (ref 0–53)
AST: 34 U/L (ref 0–37)
Alkaline Phosphatase: 116 U/L (ref 39–117)
BUN: 50 mg/dL — ABNORMAL HIGH (ref 6–23)
CHLORIDE: 100 meq/L (ref 96–112)
CO2: 21 mEq/L (ref 19–32)
CREATININE: 3.51 mg/dL — AB (ref 0.50–1.35)
Calcium: 8.5 mg/dL (ref 8.4–10.5)
GFR calc Af Amer: 17 mL/min — ABNORMAL LOW (ref >90)
GFR calc non Af Amer: 15 mL/min — ABNORMAL LOW (ref >90)
Glucose, Bld: 108 mg/dL — ABNORMAL HIGH (ref 70–99)
POTASSIUM: 4.7 meq/L (ref 3.7–5.3)
Sodium: 137 mEq/L (ref 137–147)
TOTAL PROTEIN: 6.3 g/dL (ref 6.0–8.3)

## 2013-02-26 LAB — CBC WITH DIFFERENTIAL/PLATELET
BASOS PCT: 1 % (ref 0–1)
Basophils Absolute: 0 10*3/uL (ref 0.0–0.1)
EOS ABS: 0.2 10*3/uL (ref 0.0–0.7)
Eosinophils Relative: 4 % (ref 0–5)
HCT: 37.7 % — ABNORMAL LOW (ref 39.0–52.0)
Hemoglobin: 12.5 g/dL — ABNORMAL LOW (ref 13.0–17.0)
Lymphocytes Relative: 15 % (ref 12–46)
Lymphs Abs: 0.7 10*3/uL (ref 0.7–4.0)
MCH: 31.4 pg (ref 26.0–34.0)
MCHC: 33.2 g/dL (ref 30.0–36.0)
MCV: 94.7 fL (ref 78.0–100.0)
MONO ABS: 0.7 10*3/uL (ref 0.1–1.0)
Monocytes Relative: 13 % — ABNORMAL HIGH (ref 3–12)
NEUTROS ABS: 3.5 10*3/uL (ref 1.7–7.7)
Neutrophils Relative %: 68 % (ref 43–77)
Platelets: 169 10*3/uL (ref 150–400)
RBC: 3.98 MIL/uL — ABNORMAL LOW (ref 4.22–5.81)
RDW: 13.8 % (ref 11.5–15.5)
WBC: 5.1 10*3/uL (ref 4.0–10.5)

## 2013-02-26 LAB — URINE MICROSCOPIC-ADD ON

## 2013-02-26 LAB — TROPONIN I: TROPONIN I: 0.46 ng/mL — AB (ref <0.30)

## 2013-02-26 LAB — PRO B NATRIURETIC PEPTIDE: PRO B NATRI PEPTIDE: 4115 pg/mL — AB (ref 0–450)

## 2013-02-26 MED ORDER — SODIUM CHLORIDE 0.9 % IJ SOLN
3.0000 mL | Freq: Two times a day (BID) | INTRAMUSCULAR | Status: DC
  Administered 2013-02-27 – 2013-03-01 (×6): 3 mL via INTRAVENOUS

## 2013-02-26 MED ORDER — METOPROLOL TARTRATE 12.5 MG HALF TABLET
12.5000 mg | ORAL_TABLET | Freq: Two times a day (BID) | ORAL | Status: DC
  Administered 2013-02-27: 12.5 mg via ORAL
  Filled 2013-02-26 (×3): qty 1

## 2013-02-26 MED ORDER — ASPIRIN 81 MG PO TABS: 81.0000 mg | ORAL_TABLET | ORAL | Status: DC

## 2013-02-26 MED ORDER — NITROGLYCERIN 0.4 MG SL SUBL
0.4000 mg | SUBLINGUAL_TABLET | SUBLINGUAL | Status: DC | PRN
  Administered 2013-02-26: 0.4 mg via SUBLINGUAL
  Filled 2013-02-26: qty 25

## 2013-02-26 MED ORDER — FUROSEMIDE 10 MG/ML IJ SOLN
20.0000 mg | Freq: Once | INTRAMUSCULAR | Status: AC
  Administered 2013-02-26: 20 mg via INTRAVENOUS
  Filled 2013-02-26: qty 2

## 2013-02-26 MED ORDER — HEPARIN SODIUM (PORCINE) 5000 UNIT/ML IJ SOLN
5000.0000 [IU] | Freq: Three times a day (TID) | INTRAMUSCULAR | Status: DC
  Administered 2013-02-27 – 2013-03-01 (×7): 5000 [IU] via SUBCUTANEOUS
  Filled 2013-02-26 (×10): qty 1

## 2013-02-26 MED ORDER — HYDRALAZINE HCL 50 MG PO TABS
50.0000 mg | ORAL_TABLET | Freq: Three times a day (TID) | ORAL | Status: DC
  Administered 2013-02-27 – 2013-02-28 (×5): 50 mg via ORAL
  Filled 2013-02-26 (×7): qty 1

## 2013-02-26 MED ORDER — NITROGLYCERIN 0.4 MG SL SUBL: 0.4000 mg | SUBLINGUAL_TABLET | SUBLINGUAL | Status: DC | PRN

## 2013-02-26 MED ORDER — ALBUTEROL SULFATE (2.5 MG/3ML) 0.083% IN NEBU: 3.0000 mL | INHALATION_SOLUTION | Freq: Four times a day (QID) | RESPIRATORY_TRACT | Status: DC | PRN

## 2013-02-26 MED ORDER — ACETAMINOPHEN 325 MG PO TABS: 650.0000 mg | ORAL_TABLET | ORAL | Status: DC | PRN

## 2013-02-26 MED ORDER — SODIUM CHLORIDE 0.9 % IV SOLN: 250.0000 mL | INTRAVENOUS | Status: DC | PRN

## 2013-02-26 MED ORDER — ALLOPURINOL 100 MG PO TABS
50.0000 mg | ORAL_TABLET | Freq: Every day | ORAL | Status: DC
  Administered 2013-02-27 – 2013-03-01 (×3): 50 mg via ORAL
  Filled 2013-02-26 (×3): qty 0.5

## 2013-02-26 MED ORDER — CLOPIDOGREL BISULFATE 75 MG PO TABS
75.0000 mg | ORAL_TABLET | Freq: Every day | ORAL | Status: DC
  Administered 2013-02-27 – 2013-03-01 (×3): 75 mg via ORAL
  Filled 2013-02-26 (×3): qty 1

## 2013-02-26 MED ORDER — ASPIRIN 81 MG PO CHEW
324.0000 mg | CHEWABLE_TABLET | Freq: Once | ORAL | Status: AC
Start: 2013-02-26 — End: 2013-02-26
  Administered 2013-02-26: 324 mg via ORAL
  Filled 2013-02-26: qty 4

## 2013-02-26 MED ORDER — SODIUM CHLORIDE 0.9 % IV SOLN: Freq: Once | INTRAVENOUS | Status: DC

## 2013-02-26 MED ORDER — ASPIRIN EC 81 MG PO TBEC
81.0000 mg | DELAYED_RELEASE_TABLET | Freq: Every day | ORAL | Status: DC
  Administered 2013-02-27 – 2013-03-01 (×3): 81 mg via ORAL
  Filled 2013-02-26 (×3): qty 1

## 2013-02-26 MED ORDER — ONDANSETRON HCL 4 MG/2ML IJ SOLN: 4.0000 mg | Freq: Four times a day (QID) | INTRAMUSCULAR | Status: DC | PRN

## 2013-02-26 MED ORDER — SODIUM CHLORIDE 0.9 % IJ SOLN: 3.0000 mL | INTRAMUSCULAR | Status: DC | PRN

## 2013-02-26 MED ORDER — FUROSEMIDE 10 MG/ML IJ SOLN
20.0000 mg | Freq: Two times a day (BID) | INTRAMUSCULAR | Status: DC
  Administered 2013-02-27 – 2013-02-28 (×3): 20 mg via INTRAVENOUS
  Filled 2013-02-26 (×5): qty 2

## 2013-02-26 NOTE — ED Notes (Signed)
Md Masneri at bedside.

## 2013-02-26 NOTE — ED Notes (Signed)
CRITICAL VALUE ALERT  Critical value received:  Troponin 0.46  Date of notification:  02/26/2013  Time of notification:  2134  Critical value read back: Yes  Nurse who received alert:  Elyn Peers  MD notified (1st page):  MD [facility]

## 2013-02-26 NOTE — ED Notes (Signed)
Pt denies SOB at this time. Pt on 2L 02, pt's 02 sats checked on RA and read 91%. PT placed on 2L 02 again, pt's 02 sats reading 97%.

## 2013-02-26 NOTE — ED Provider Notes (Signed)
CSN: [id number]     Arrival date & time 02/26/13  1737 History   First MD Initiated Contact with Patient 02/26/13 1901     Chief Complaint  Patient presents with  . Nasal Congestion  . Wheezing   (Consider location/radiation/quality/duration/timing/severity/associated sxs/prior Treatment) HPI Comments: 80 year old male presents for evaluation of cough, shortness of breath, wheezing. he has had an upper respiratory infection for the past couple of days. He was seen by his primary care physician on Tuesday and was prescribed symptomatic treatment and azithromycin. He is here today because he is continuing to worsen. He has been requiring his wife's home oxygen while at home in order to avoid becoming severely short of breath. He also admits to increased swelling of his legs, equal bilaterally. His shortness of breath is much worse with any activity or with lying flat and is somewhat relieved by sitting up and being still. Denies fever, recent travel, sick contacts. He did take his blood pressure medicines today  Patient is a 80 y.o. male presenting with wheezing.  Wheezing Associated symptoms: chest tightness, cough and shortness of breath   Associated symptoms: no chest pain, no fatigue, no fever, no rash and no sore throat     Past Medical History  Diagnosis Date  . Hypertension   . Gout   . IHD (ischemic heart disease)     Remote MI in 2009 with late presentation; no reperfusion. Has large area of infarct in the apical anterior area on last nuclear in 2011. Managed medically  . Chronic renal insufficiency     creatinine ranges around 2.8  . OA (osteoarthritis) of knee     Left knee with revision and replacement of prosthetic L TKR  . 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 DOE (dyspnea on exertion)   . Herpes zoster 12/2009  . Peripheral edema   . RBBB (right bundle branch block)   . History of BPH   . Cancer   . Renal insufficiency   . Asthma    Past Surgical History   Procedure Laterality Date  . Cardiac catheterization  03/24/2007  . Total knee arthroplasty      left knee  . Cardiovascular stress test  04/02/2009    EF 27% AND A LARGE AREA OF INFARCT IN THE APICAL ANTERIOR WITH MILD PERI-INFARCT ISCHEMIA  . Transthoracic echocardiogram  04/04/2009    EF 45-50%  . Knee surgery  1998    left knee   Family History  Problem Relation Age of Onset  . Coronary artery disease Mother   . Emphysema Father     never smoked, worked as a Psychologist, sport and exercise   History  Substance Use Topics  . Smoking status: Never Smoker   . Smokeless tobacco: Never Used  . Alcohol Use: No    Review of Systems  Constitutional: Negative for fever, chills and fatigue.  HENT: Negative for sore throat.   Eyes: Negative for visual disturbance.  Respiratory: Positive for cough, chest tightness, shortness of breath and wheezing.   Cardiovascular: Positive for leg swelling. Negative for chest pain and palpitations.  Gastrointestinal: Negative for nausea, vomiting, abdominal pain, diarrhea and constipation.  Genitourinary: Negative for dysuria, urgency, frequency and hematuria.  Musculoskeletal: Negative for arthralgias, myalgias, neck pain and neck stiffness.  Skin: Negative for rash.  Neurological: Negative for dizziness, weakness and light-headedness.    Allergies  Crestor; Gentamycin; Hctz; and Lipitor  Home Medications   Current Outpatient Rx  Name  Route  Sig  Dispense  Refill  . allopurinol (ZYLOPRIM) 100 MG tablet   Oral   Take 100 mg by mouth as directed. 1/2 tablet daily         . aspirin 81 MG tablet   Oral   Take 81 mg by mouth every other day.           . clopidogrel (PLAVIX) 75 MG tablet   Oral   Take 1 tablet (75 mg total) by mouth daily.   90 tablet   3   . famotidine (PEPCID) 20 MG tablet      One at bedtime   30 tablet   2   . hydrALAZINE (APRESOLINE) 50 MG tablet   Oral   Take 50 mg by mouth 3 (three) times daily.         . metoprolol  tartrate (LOPRESSOR) 25 MG tablet   Oral   Take 25 mg by mouth 2 (two) times daily. 1/2 TAB         . nebivolol (BYSTOLIC) 5 MG tablet   Oral   Take 1 tablet (5 mg total) by mouth daily.   30 tablet   2   . nitroGLYCERIN (NITROSTAT) 0.4 MG SL tablet   Sublingual   Place 1 tablet (0.4 mg total) under the tongue every 5 (five) minutes as needed.   25 tablet   5   . PROAIR HFA 108 (90 BASE) MCG/ACT inhaler      USE 2 PUFFS EVERY 6 HOURS AS NEEDED   8.5 g   1     Patient needs to get additional refills from PCP    BP 204/92  Pulse 84  Temp(Src) 98.5 F (36.9 C) (Oral)  Resp 26  SpO2 100% Physical Exam  Nursing note and vitals reviewed. Constitutional: He is oriented to person, place, and time. He appears well-developed and well-nourished. No distress.  HENT:  Head: Normocephalic.  Neck: Normal range of motion. Neck supple. JVD present.  Cardiovascular: Normal rate, regular rhythm, normal heart sounds, intact distal pulses and normal pulses.   No extrasystoles are present.  Pulmonary/Chest: Accessory muscle usage present. Tachypnea noted. No respiratory distress. He has wheezes (scattered, expiratory). He has rales (Bibasilar).  Neurological: He is alert and oriented to person, place, and time. Coordination normal.  Skin: Skin is warm and dry. No rash noted. He is not diaphoretic.  Psychiatric: He has a normal mood and affect. Judgment normal.    ED Course  Procedures (including critical care time) Labs Review Labs Reviewed - No data to display Imaging Review No results found.   EKG nonischemic, some minimal changes in the lateral leads  MDM   1. CHF exacerbation    Patient has significant shortness of breath and is requiring home oxygen, although he has never been prescribed home oxygen. He will likely require inpatient treatment. Transferred to ED via [place], PA-C 02/26/13 1927

## 2013-02-26 NOTE — ED Notes (Signed)
Transporting patient to new room assignment. 

## 2013-02-26 NOTE — ED Notes (Addendum)
Pt reports productive cough and generalized weakness since Tuesday. States he was seen by PCP and started on Z pack yesterday with no relief of SOB. Pt states he has been wearing wife's oxygen at home for comfort. Also states he has been sleeping propped up at night. Pt takes hydralazine 3/day, states took twice today.

## 2013-02-26 NOTE — ED Provider Notes (Signed)
CSN: [id number]     Arrival date & time 02/26/13  1953 History   First MD Initiated Contact with Patient 02/26/13 2020     Chief Complaint  Patient presents with  . Shortness of Breath   (Consider location/radiation/quality/duration/timing/severity/associated sxs/prior Treatment) HPI Comments: 80 yo wm with pmh of HTN, IHD, renal insufficiency, OA, MI 6 years ago, DOE, zoster, peripheral edema, RBBB, presents with 5 days of worsening SOB.  Pt was seen by PCM  2 days ago and started on azithromycin.  Seen at [facility] today and was noted to be hypertensive with EKG changes.  T/S to ER   Patient is a 80 y.o. male presenting with shortness of breath. The history is provided by the patient, the EMS personnel and the spouse.  Shortness of Breath Severity:  Moderate Onset quality:  Gradual Duration:  5 days Timing:  Constant Chronicity:  New Context: activity   Relieved by: Wife's home O2. Worsened by:  Nothing tried Ineffective treatments:  None tried (Zpack started one day ago) Associated symptoms: cough   Associated symptoms: no abdominal pain, no chest pain, no claudication, no diaphoresis, no ear pain, no fever, no headaches, no hemoptysis, no neck pain and no PND     Past Medical History  Diagnosis Date  . Hypertension   . Gout   . IHD (ischemic heart disease)     Remote MI in 2009 with late presentation; no reperfusion. Has large area of infarct in the apical anterior area on last nuclear in 2011. Managed medically  . Chronic renal insufficiency     creatinine ranges around 2.8  . OA (osteoarthritis) of knee     Left knee with revision and replacement of prosthetic L TKR  . 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 DOE (dyspnea on exertion)   . Herpes zoster 12/2009  . Peripheral edema   . RBBB (right bundle branch block)   . History of BPH   . Cancer   . Renal insufficiency   . Asthma    Past Surgical History  Procedure Laterality Date  . Cardiac catheterization  03/24/2007  .  Total knee arthroplasty      left knee  . Cardiovascular stress test  04/02/2009    EF 27% AND A LARGE AREA OF INFARCT IN THE APICAL ANTERIOR WITH MILD PERI-INFARCT ISCHEMIA  . Transthoracic echocardiogram  04/04/2009    EF 45-50%  . Knee surgery  1998    left knee   Family History  Problem Relation Age of Onset  . Coronary artery disease Mother   . Emphysema Father     never smoked, worked as a Psychologist, sport and exercise   History  Substance Use Topics  . Smoking status: Never Smoker   . Smokeless tobacco: Never Used  . Alcohol Use: No    Review of Systems  Constitutional: Negative for fever and diaphoresis.  HENT: Negative for ear pain.   Respiratory: Positive for cough and shortness of breath. Negative for hemoptysis.   Cardiovascular: Negative for chest pain, claudication and PND.  Gastrointestinal: Negative for abdominal pain.  Musculoskeletal: Negative for neck pain.  Neurological: Negative for headaches.    Allergies  Crestor; Gentamycin; Hctz; and Lipitor  Home Medications   No current outpatient prescriptions on file. BP 175/89  Pulse 79  Temp(Src) 98.7 F (37.1 C) (Oral)  Resp 21  Wt 160 lb 6.4 oz (72.757 kg)  SpO2 94% Physical Exam  Nursing note and vitals reviewed. Constitutional: He is oriented  to person, place, and time. He appears well-developed and well-nourished. No distress.  HENT:  Head: Normocephalic and atraumatic.  Nose: Nose normal.  Mouth/Throat: Oropharynx is clear and moist. No oropharyngeal exudate.  Eyes: Conjunctivae are normal. Right eye exhibits no discharge. Left eye exhibits no discharge.  Neck: Normal range of motion. Neck supple. No JVD present.  Cardiovascular: Normal rate and regular rhythm.  Exam reveals no friction rub.   No murmur heard. Pulmonary/Chest: Effort normal. No stridor. He has decreased breath sounds. He has wheezes.  Abdominal: Soft. Bowel sounds are normal. He exhibits no pulsatile liver, no fluid wave, no ascites and no mass.  There is no hepatosplenomegaly. There is no tenderness. There is no rebound, no guarding and no CVA tenderness.  Musculoskeletal: Normal range of motion.  Neurological: He is alert and oriented to person, place, and time. He has normal reflexes. Coordination normal.  Skin: Skin is warm. He is not diaphoretic.    ED Course  Procedures (including critical care time) Labs Review Labs Reviewed  CBC WITH DIFFERENTIAL - Abnormal; Notable for the following:    RBC 3.98 (*)    Hemoglobin 12.5 (*)    HCT 37.7 (*)    Monocytes Relative 13 (*)    All other components within normal limits  COMPREHENSIVE METABOLIC PANEL - Abnormal; Notable for the following:    Glucose, Bld 108 (*)    BUN 50 (*)    Creatinine, Ser 3.51 (*)    Albumin 3.2 (*)    Total Bilirubin <0.2 (*)    GFR calc non Af Amer 15 (*)    GFR calc Af Amer 17 (*)    All other components within normal limits  TROPONIN I - Abnormal; Notable for the following:    Troponin I 0.46 (*)    All other components within normal limits  PRO B NATRIURETIC PEPTIDE - Abnormal; Notable for the following:    Pro B Natriuretic peptide (BNP) 4115.0 (*)    All other components within normal limits  URINALYSIS, ROUTINE W REFLEX MICROSCOPIC - Abnormal; Notable for the following:    Color, Urine STRAW (*)    APPearance HAZY (*)    Hgb urine dipstick TRACE (*)    Protein, ur 100 (*)    All other components within normal limits  MRSA PCR SCREENING  URINE MICROSCOPIC-ADD ON  BASIC METABOLIC PANEL  CBC  CREATININE, SERUM  TROPONIN I  TROPONIN I  TROPONIN I  CK TOTAL AND CKMB  CK TOTAL AND CKMB  CK TOTAL AND CKMB   Imaging Review Dg Chest 2 View  02/26/2013   CLINICAL DATA:  Shortness of breath, cough.  EXAM: CHEST  2 VIEW  COMPARISON:  08/06/2012  FINDINGS: Heart is borderline enlarged. Mild peribronchial thickening. Right basilar atelectasis. No visible effusions.  Nodular density in the right mid lung peripherally measuring approximately 13  mm in diameter. Cannot exclude pulmonary nodule. This could be further evaluated with chest CT.  No acute bony abnormality.  IMPRESSION: Mild bronchitic changes.  Right base atelectasis.  Nodular density peripherally in the right mid lung. This could be further evaluated with chest CT if felt clinically indicated.   Electronically Signed   By: Rolm Baptise M.D.   On: 02/26/2013 21:01     Old EKG from October 01, 2012 with similar findings.  MDM   1. Acute on chronic systolic CHF (congestive heart failure)   2. Benign hypertensive heart disease without heart failure   3. Chronic  renal insufficiency   4. Cough   5. HBP (high blood pressure)   6. Ischemic heart disease   7. Right bundle branch block   8. Chronic kidney disease (CKD), stage IV (severe)    80 year old male with past mental history significant for MI remote 2009, ischemic heart disease, hypertension, gout, dyspnea on exertion, peripheral edema, herpes zoster presents emergency chief complaint of shortness of breath. Symptoms have worsened over the past 5 days. Patient has been seen as an outpatient by his primary care provider and started on antibiotics. He was also seen at urgent care today and was noted to have significant hypertension and EKG changes.  EKG in the emergency department reveals right bundle branch block with anterior Q waves.  On arrival to the emergency room the patient is in no acute distress he denies chest pain. His cardiac history significant for cardiac catheterization in 2009 and a echo stress in 2011 which revealed EF 27% and a large area of infarct and apical anterior. He had a TEE in 2011 with an EF of 45-50%. His physical exam today is significant for decreased lung sounds with crackles and peripheral edema bilaterally in lower extremities. His respiratory status is unremarkable. He is breathing with no increased effort. His pulse ox is normal on room air, however he feels subjectively better with nasal  cannula O2.  And for evaluation for ACS, CHF, pneumonia, as well as other etiologies shortness of breath. Will give Lasix and nitroglycerin to treat suspected CHF.  CRITICAL CARE Performed by: Curly Rim, Dilia Alemany   Total critical care time: 45  Critical care time was exclusive of separately billable procedures and treating other patients.  Critical care was necessary to treat or prevent imminent or life-threatening deterioration.  Critical care was time spent personally by me on the following activities: development of treatment plan with patient and/or surrogate as well as nursing, discussions with consultants, evaluation of patient's response to treatment, examination of patient, obtaining history from patient or surrogate, ordering and performing treatments and interventions, ordering and review of laboratory studies, ordering and review of radiographic studies, pulse oximetry and re-evaluation of patient's condition.  Trop positive at 0.456.  BNP 4115.  AKI with Cr 3.51. Concern for trop leak from CHF vs AKI vs NSTEMI.  Must assume NSTEMI at this point.  Cardiology consulted.  Filed Vitals:   02/26/13 2345  BP: 175/89  Pulse: 79  Temp: 98.7 F (37.1 C)  Resp: 21   I spoke to cardiology, Dr. Tommi Rumps - hold on [place] or [facility] at this time.  He will evaluate pt.    Plan to admit to cardiology.  Hold on [facility] or [facility] at this time.      Elmer Sow, MD 02/26/13 [phone]

## 2013-02-26 NOTE — ED Notes (Signed)
C/o congestion and wheezing States that he was seen by PCP on Tuesday  States he has SOB, wheezing  Started zpak on yesterday Has had breathing tx

## 2013-02-26 NOTE — ED Notes (Signed)
Sent to ED from [facility] via [place] with c/o shortness of breath that started Wednesday-- saw private doctor on Thursday, got a Z-pak, went to Urgent Care today, had EKG changes, sent here.

## 2013-02-26 NOTE — ED Notes (Signed)
I had this pt ambulate around the department w/o O2. HR remained at 90 and SpO2 remained at 99-100% while ambulating. Pt denied SOB while ambulating, but as soon as the pt sat back down on exam table, his RR increased to 34/min. Pt's wheezing was more pronounced. The same difference in observation was noticed by the pt's wife & daughter. I overheard wife say that his color looked "different."

## 2013-02-26 NOTE — H&P (Signed)
Patient ID: [name] MRN: [id number], DOB/AGE: 1928-09-25   Admit date: 02/26/2013   Primary Physician: Delphina Cahill, MD Primary Cardiologist: Dr. Darlin Coco  Pt. 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   Problem List  Past Medical History  Diagnosis Date  . Hypertension   . Gout   . IHD (ischemic heart disease)     Remote MI in 2009 with late presentation; no reperfusion. Has large area of infarct in the apical anterior area on last nuclear in 2011. Managed medically  . Chronic renal insufficiency     creatinine ranges around 2.8  . OA (osteoarthritis) of knee     Left knee with revision and replacement of prosthetic L TKR  . 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 DOE (dyspnea on exertion)   . Herpes zoster 12/2009  . Peripheral edema   . RBBB (right bundle branch block)   . History of BPH   . Cancer   . Renal insufficiency   . Asthma     Past Surgical History  Procedure Laterality Date  . Cardiac catheterization  03/24/2007  . Total knee arthroplasty      left knee  . Cardiovascular stress test  04/02/2009    EF 27% AND A LARGE AREA OF INFARCT IN THE APICAL ANTERIOR WITH MILD PERI-INFARCT ISCHEMIA  . Transthoracic echocardiogram  04/04/2009    EF 45-50%  . Knee surgery  1998    left knee     Allergies  Allergies  Allergen Reactions  . Crestor [Rosuvastatin Calcium]     MUSCLE PAIN  . Gentamycin [Gentamicin]     Decreased kidney fx  . Hctz [Hydrochlorothiazide]     dizziness  . 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   Mr. Wessinger reports he was in his USOH until  Tuesday when he developed dyspnea (worse on exertion) and a cough. No associated chest pain or pressure. Symptoms worsened and on Thursday he saw his PCP who gave him a dose of IV decadron, mucinex with pseudoephedrine, and a cough suppressant with codeine. Later that day his dyspnea progressed and he began wearing his wife's oxygen, with improvement in his symptoms. He was seen again by his PCP on Friday and a Z-pack was started. His symptoms continued to worsen and he presented for evaluation today.  Of note, his cough was productive of frothy, yellowish sputum. He had an isolated temperature to 101F yesterday.  Because of progressive symptoms, he went to urgent care for evaluation. He was hypertensive to ~200, felt to be volume overloaded and transferred to the ED for admission. In the ED, he remained hypertensive. ECG demonstrated old RBBB with evidence of old anteroseptal MI. Labs notable for Cr 3.5 (baseline), TnI 0.46, Pro BNP 4115. He was given 20mg  IV lasix, 0.4mg  SL NTG, 324mg  ASA. He has had approximately 800cc UOP over 2.5 hours. Cardiology was consulted.   On my interview, he describes  slight worsening of baseline LE edema and 3-4 pounds weight gain.  He sleeps on a 7 inch  wedge and has since 3 months ago, when he started using it to treat cough and DOE in the morning. The wedge did help these symptoms. He has not added pillows to the wedge. He denies PND. He has not had chest pain or pressure at any time. Prior to his MI in 2009, he reported he had heartburn symptoms and not chest pain.  He has not had any heartburn type symptoms.   On chart review, he has had difficult to control blood pressure and problems with LE edema for some time. He has advanced CKD that has been followed in the outpatient setting. He is scheduled to see a nephrologist soon.  Home Medications  Prior to Admission medications   Medication Sig Start Date End Date Taking? Authorizing Provider  albuterol (PROVENTIL  HFA;VENTOLIN HFA) 108 (90 BASE) MCG/ACT inhaler Inhale 2 puffs into the lungs every 6 (six) hours as needed for wheezing or shortness of breath.   Yes Historical Provider, MD  allopurinol (ZYLOPRIM) 100 MG tablet Take 50 mg by mouth daily. 1/2 tablet daily 01/06/12  Yes Darlin Coco, MD  aspirin 81 MG tablet Take 81 mg by mouth every other day.     Yes Historical Provider, MD  clopidogrel (PLAVIX) 75 MG tablet Take 1 tablet (75 mg total) by mouth daily. 09/14/12 09/14/13 Yes Darlin Coco, MD  hydrALAZINE (APRESOLINE) 50 MG tablet Take 50 mg by mouth 3 (three) times daily.   Yes Historical Provider, MD  metoprolol tartrate (LOPRESSOR) 25 MG tablet Take 12.5 mg by mouth 2 (two) times daily.  12/29/12  Yes Historical Provider, MD  nitroGLYCERIN (NITROSTAT) 0.4 MG SL tablet Place 1 tablet (0.4 mg total) under the tongue every 5 (five) minutes as needed. 06/15/12  Yes Darlin Coco, MD    Family History  Family History  Problem Relation Age of Onset  . Coronary artery disease Mother   . Emphysema Father     never smoked, worked as a Psychologist, sport and exercise    Social History  History   Social History  . Marital Status: Married    Spouse Name: N/A    Number of Children: N/A  . Years of Education: N/A   Occupational History  . Retired [place] Topics  . Smoking status: Never Smoker   . Smokeless tobacco: Never Used  . Alcohol Use: No  . Drug Use: No  . Sexual Activity: Not on file   Other Topics Concern  . Not on file   Social History Narrative  . No narrative on file     Review of Systems General:  No chills. + fever yesterday and weight gain per HPI Cardiovascular:  See HPI Dermatological: No rash, lesions/masses Respiratory: See HPI Urologic: No hematuria, dysuria Abdominal:   No nausea, vomiting, diarrhea, bright red blood per rectum, melena, or hematemesis Neurologic:  No visual changes, wkns, changes in mental status. All other systems reviewed and are  otherwise negative except as noted above.  Physical Exam  Blood pressure 174/97, pulse 74, temperature 98.3 F (36.8 C), temperature source Oral, resp. rate 22, SpO2 98.00%.  General: Pleasant, NAD Psych: Normal affect. Neuro: Alert and oriented X 3. Moves all extremities spontaneously. HEENT: Normal  Neck: Supple without bruits. JVP = 11-12cmH2O Lungs:  Decreased breath sounds at bases, occasional scattered crackles with occasional expiratory wheezes.  Heart: RRR no s3, s4, or murmurs. Abdomen: Soft, non-tender, non-distended, BS + x 4.  Extremities: No clubbing, cyanosis. DP/PT/Radials 2+ and equal bilaterally.  1+ LE edema to upper calf.   Labs  Troponin [facility] of Care Test) No results found for this basename: TROPIPOC,  in the last 72 hours  Recent Labs  02/26/13 2025  TROPONINI 0.46*   Lab Results  Component Value Date   WBC 5.1 02/26/2013   HGB 12.5* 02/26/2013   HCT 37.7* 02/26/2013   MCV 94.7 02/26/2013   PLT 169 02/26/2013    Recent Labs Lab 02/26/13 2025  NA 137  K 4.7  CL 100  CO2 21  BUN 50*  CREATININE 3.51*  CALCIUM 8.5  PROT 6.3  BILITOT <0.2*  ALKPHOS 116  ALT 28  AST 34  GLUCOSE 108*   Lab Results  Component Value Date   CHOL 158 02/01/2013   HDL 53.20 02/01/2013   LDLCALC 95 02/01/2013   TRIG 50.0 02/01/2013   Lab Results  Component Value Date   DDIMER  Value: 0.82        AT THE INHOUSE ESTABLISHED CUTOFF VALUE OF 0.48 ug/mL FEU, THIS ASSAY HAS BEEN DOCUMENTED IN THE LITERATURE TO HAVE* 03/23/2007     Radiology/Studies  Dg Chest 2 View  02/26/2013   CLINICAL DATA:  Shortness of breath, cough.  EXAM: CHEST  2 VIEW  COMPARISON:  08/06/2012  FINDINGS: Heart is borderline enlarged. Mild peribronchial thickening. Right basilar atelectasis. No visible effusions.  Nodular density in the right mid lung peripherally measuring approximately 13 mm in diameter. Cannot exclude pulmonary nodule. This could be further evaluated with chest CT.  No acute bony  abnormality.  IMPRESSION: Mild bronchitic changes.  Right base atelectasis.  Nodular density peripherally in the right mid lung. This could be further evaluated with chest CT if felt clinically indicated.   Electronically Signed   By: Rolm Baptise M.D.   On: 02/26/2013 21:01    ECG RBBB, old ASMI. Unchanged from prior.    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   1. Volume overload in setting of LV systolic dysfunction, advanced CKD, and hypertensive urgency.  Given stable CKD, precipitant may be worsening LV function, hypertension, or perhaps infection given he had the single fever yesterday as well as cough. He is clinically not very volume overloaded and so I do not think he will need extensive diuresis. He should probably be discharged on a diuretic, which will also help with his HTN.  - Start lasix 20mg  IV BID; may only require BID dosing for a day or two - Check TTE - Cannot tolerate ACE/ARB or MRA given renal insufficiency - Continue BB  2. NSTEMI.  Given lack of symptoms c/w ischemia in setting of known CAD, HF, and advanced CKD, would suspect this is most likely Type II MI.   - Trend CBM including CK-MB given renal insufficieny - ASA, BB, plavix - Off statin due to intolerance; could consider starting zetia - Check echo  3. CKD, Stage IV/V.  Renal function stable. UA with proteinuria. No current indication for dialysis as volume overload should be able to be managed with diuresis at this stage. I was frank with the family that it is not clear how long oral diuretics will be sufficient.  - Agree with outpatient nephrology appointment  4. Hypertensive urgency in setting of advanced CKD, pseudoephedrine use, LVSD, volume overload. BP already improved with diuresis and SL  NTG.  - No pseudoephedrine - IV diuresis - Home  antihypertensives  Signed, Lamar Sprinkles, MD 02/26/2013, 10:10 PM

## 2013-02-27 ENCOUNTER — Encounter (HOSPITAL_COMMUNITY): Payer: Self-pay

## 2013-02-27 DIAGNOSIS — N184 Chronic kidney disease, stage 4 (severe): Secondary | ICD-10-CM

## 2013-02-27 DIAGNOSIS — I259 Chronic ischemic heart disease, unspecified: Secondary | ICD-10-CM

## 2013-02-27 DIAGNOSIS — I5023 Acute on chronic systolic (congestive) heart failure: Secondary | ICD-10-CM

## 2013-02-27 DIAGNOSIS — I517 Cardiomegaly: Secondary | ICD-10-CM

## 2013-02-27 DIAGNOSIS — I10 Essential (primary) hypertension: Secondary | ICD-10-CM

## 2013-02-27 DIAGNOSIS — I509 Heart failure, unspecified: Secondary | ICD-10-CM

## 2013-02-27 LAB — CK TOTAL AND CKMB (NOT AT ARMC)
CK TOTAL: 255 U/L — AB (ref 7–232)
CK, MB: 11.7 ng/mL (ref 0.3–4.0)
CK, MB: 10.4 ng/mL (ref 0.3–4.0)
CK, MB: 11.2 ng/mL — AB (ref 0.3–4.0)
RELATIVE INDEX: 6.2 — AB (ref 0.0–2.5)
RELATIVE INDEX: 6.4 — AB (ref 0.0–2.5)
Relative Index: 4.6 — ABNORMAL HIGH (ref 0.0–2.5)
Total CK: 168 U/L (ref 7–232)
Total CK: 175 U/L (ref 7–232)

## 2013-02-27 LAB — BASIC METABOLIC PANEL
BUN: 52 mg/dL — ABNORMAL HIGH (ref 6–23)
CHLORIDE: 103 meq/L (ref 96–112)
CO2: 21 meq/L (ref 19–32)
Calcium: 8.3 mg/dL — ABNORMAL LOW (ref 8.4–10.5)
Creatinine, Ser: 3.56 mg/dL — ABNORMAL HIGH (ref 0.50–1.35)
GFR calc non Af Amer: 15 mL/min — ABNORMAL LOW (ref >90)
GFR, EST AFRICAN AMERICAN: 17 mL/min — AB (ref >90)
Glucose, Bld: 97 mg/dL (ref 70–99)
Potassium: 4.3 mEq/L (ref 3.7–5.3)
SODIUM: 140 meq/L (ref 137–147)

## 2013-02-27 LAB — CREATININE, SERUM
Creatinine, Ser: 3.49 mg/dL — ABNORMAL HIGH (ref 0.50–1.35)
GFR calc Af Amer: 17 mL/min — ABNORMAL LOW (ref >90)
GFR, EST NON AFRICAN AMERICAN: 15 mL/min — AB (ref >90)

## 2013-02-27 LAB — CBC
HCT: 34.9 % — ABNORMAL LOW (ref 39.0–52.0)
Hemoglobin: 11.9 g/dL — ABNORMAL LOW (ref 13.0–17.0)
MCH: 32 pg (ref 26.0–34.0)
MCHC: 34.1 g/dL (ref 30.0–36.0)
MCV: 93.8 fL (ref 78.0–100.0)
Platelets: 172 10*3/uL (ref 150–400)
RBC: 3.72 MIL/uL — AB (ref 4.22–5.81)
RDW: 13.7 % (ref 11.5–15.5)
WBC: 4.6 10*3/uL (ref 4.0–10.5)

## 2013-02-27 LAB — TROPONIN I
TROPONIN I: 0.42 ng/mL — AB (ref <0.30)
Troponin I: 0.44 ng/mL (ref <0.30)
Troponin I: 0.36 ng/mL (ref <0.30)

## 2013-02-27 LAB — MRSA PCR SCREENING: MRSA by PCR: NEGATIVE

## 2013-02-27 MED ORDER — LEVALBUTEROL HCL 0.63 MG/3ML IN NEBU
INHALATION_SOLUTION | RESPIRATORY_TRACT | Status: AC
  Administered 2013-02-27: 0.63 mg
  Filled 2013-02-27: qty 3

## 2013-02-27 MED ORDER — IPRATROPIUM BROMIDE 0.02 % IN SOLN
0.5000 mg | Freq: Four times a day (QID) | RESPIRATORY_TRACT | Status: DC
  Administered 2013-02-27 – 2013-03-01 (×8): 0.5 mg via RESPIRATORY_TRACT
  Filled 2013-02-27 (×9): qty 2.5

## 2013-02-27 MED ORDER — LEVALBUTEROL HCL 0.63 MG/3ML IN NEBU
0.6300 mg | INHALATION_SOLUTION | Freq: Four times a day (QID) | RESPIRATORY_TRACT | Status: DC
  Administered 2013-02-27 – 2013-03-01 (×8): 0.63 mg via RESPIRATORY_TRACT
  Filled 2013-02-27 (×17): qty 3

## 2013-02-27 MED ORDER — IPRATROPIUM BROMIDE 0.02 % IN SOLN
RESPIRATORY_TRACT | Status: AC
Start: 2013-02-27 — End: 2013-02-27
  Administered 2013-02-27: 0.5 mg
  Filled 2013-02-27: qty 2.5

## 2013-02-27 MED ORDER — METOPROLOL TARTRATE 25 MG PO TABS
25.0000 mg | ORAL_TABLET | Freq: Two times a day (BID) | ORAL | Status: DC
  Administered 2013-02-27 – 2013-03-01 (×5): 25 mg via ORAL
  Filled 2013-02-27 (×6): qty 1

## 2013-02-27 MED ORDER — NITROGLYCERIN 2 % TD OINT
1.0000 [in_us] | TOPICAL_OINTMENT | Freq: Four times a day (QID) | TRANSDERMAL | Status: DC
  Administered 2013-02-27 – 2013-02-28 (×4): 1 [in_us] via TOPICAL
  Filled 2013-02-27 (×2): qty 30

## 2013-02-27 NOTE — Progress Notes (Signed)
Patient Name: [name] Date of Encounter: 02/27/2013  Principal Problem:   Acute on chronic systolic CHF (congestive heart failure) Active Problems:   Ischemic heart disease   Right bundle branch block   Chronic renal insufficiency   HBP (high blood pressure)   CKD (chronic kidney disease), stage IV   Length of Stay: 1  SUBJECTIVE  The patient feels significantly better than yesterday, SOB and LE edema have improved.  CURRENT MEDS . allopurinol  50 mg Oral Daily  . aspirin EC  81 mg Oral Daily  . clopidogrel  75 mg Oral Daily  . furosemide  20 mg Intravenous BID  . heparin  5,000 Units Subcutaneous Q8H  . hydrALAZINE  50 mg Oral TID  . metoprolol tartrate  25 mg Oral BID  . nitroGLYCERIN  1 inch Topical Q6H  . sodium chloride  3 mL Intravenous Q12H    OBJECTIVE  Filed Vitals:   02/26/13 2200 02/26/13 2315 02/26/13 2345 02/27/13 0558  BP: 174/97 173/82 175/89 150/67  Pulse: 74 74 79 57  Temp:   98.7 F (37.1 C) 98 F (36.7 C)  TempSrc:   Oral Oral  Resp: 22 29 21 18   Height:    5\' 3"  (1.6 m)  Weight:   160 lb 6.4 oz (72.757 kg) 158 lb 12.8 oz (72.031 kg)  SpO2: 98% 97% 94% 98%    Intake/Output Summary (Last 24 hours) at 02/27/13 0846 Last data filed at 02/27/13 0559  Gross per 24 hour  Intake      0 ml  Output   1875 ml  Net  -1875 ml   Filed Weights   02/26/13 2345 02/27/13 0558  Weight: 160 lb 6.4 oz (72.757 kg) 158 lb 12.8 oz (72.031 kg)    PHYSICAL EXAM  General: Pleasant, NAD. Neuro: Alert and oriented X 3. Moves all extremities spontaneously. Psych: Normal affect. HEENT:  Normal  Neck: Supple without bruits or JVD. Lungs:  Resp regular and unlabored, some crackles at the bases, wheezing  Heart: RRR no s3, s4, or murmurs. Abdomen: Soft, non-tender, non-distended, BS + x 4.  Extremities: No clubbing, cyanosis or edema. DP/PT/Radials 2+ and equal bilaterally.  Accessory Clinical Findings  CBC  Recent Labs  02/26/13 2025  02/27/13 0110  WBC 5.1 4.6  NEUTROABS 3.5  --   HGB 12.5* 11.9*  HCT 37.7* 34.9*  MCV 94.7 93.8  PLT 169 284   Basic Metabolic Panel  Recent Labs  02/26/13 2025 02/27/13 0110 02/27/13 0630  NA 137  --  140  K 4.7  --  4.3  CL 100  --  103  CO2 21  --  21  GLUCOSE 108*  --  97  BUN 50*  --  52*  CREATININE 3.51* 3.49* 3.56*  CALCIUM 8.5  --  8.3*   Liver Function Tests  Recent Labs  02/26/13 2025  AST 34  ALT 28  ALKPHOS 116  BILITOT <0.2*  PROT 6.3  ALBUMIN 3.2*   Cardiac Enzymes  Recent Labs  02/26/13 2025 02/27/13 0110 02/27/13 0656  CKTOTAL  --  255* 168  CKMB  --  11.7* 10.4*  TROPONINI 0.46* 0.42* 0.44*   Radiology/Studies  Dg Chest 2 View  02/26/2013   CLINICAL DATA:  Shortness of breath, cough.  IMPRESSION: Mild bronchitic changes.  Right base atelectasis.  Nodular density peripherally in the right mid lung. This could be further evaluated with chest CT if felt clinically indicated.  Electronically Signed   By: Rolm Baptise M.D.   On: 02/26/2013 21:01   TELE SR, no arrhythmias  ECG: SR, old anterior MI, RBBB, unchanged from prior   [place] with CAD s/p large anterior MI with late presentation in 2009, managed medically, ICM (EF 27% and large area of infarct in the apical anterior area on last nuclear in 2011). Managed medically, CKD IV/V (due to gent toxicity), OA, gout, LE edema who presents with volume overload.   1. Volume overload in setting of LV systolic dysfunction, advanced CKD, and hypertensive urgency. Given stable CKD, precipitant may be worsening LV function, hypertension, or perhaps infection given he had the single fever yesterday as well as cough.  HE improved significantly with overnight - 1.8 Liter fluid balance, we will continue gentle diuresis.  He should probably be discharged on a diuretic, which will also help with his HTN.  - continue  lasix 20mg  IV BID -  TTE pending - Cannot tolerate ACE/ARB or MRA  given renal insufficiency  - Continue BB   2. NSTEMI. Given lack of symptoms c/w ischemia in setting of known CAD, HF, and advanced CKD, would suspect this is most likely Type II MI.  - Trend CBM including CK-MB given renal insufficieny  - ASA, BB, plavix  - Off statin due to intolerance; could consider starting zetia  - Check echo   3. CKD, Stage IV/V. Renal function stable. UA with proteinuria. No current indication for dialysis as volume overload should be able to be managed with diuresis at this stage. I was frank with the family that it is not clear how long oral diuretics will be sufficient.  - Agree with outpatient nephrology appointment   4. Hypertensive urgency in setting of advanced CKD, pseudoephedrine use, LVSD, volume overload. BP already improved with diuresis and SL NTG.  - No pseudoephedrine  - IV diuresis  - Home antihypertensives , add Imdur 30 mg po daily for better control  5. Wheezing - we will add nebulizer treatments  Signed, Ena Dawley, H MD, [facility] 02/27/2013

## 2013-02-28 DIAGNOSIS — I214 Non-ST elevation (NSTEMI) myocardial infarction: Secondary | ICD-10-CM | POA: Diagnosis present

## 2013-02-28 DIAGNOSIS — R799 Abnormal finding of blood chemistry, unspecified: Secondary | ICD-10-CM

## 2013-02-28 DIAGNOSIS — I119 Hypertensive heart disease without heart failure: Secondary | ICD-10-CM

## 2013-02-28 DIAGNOSIS — R778 Other specified abnormalities of plasma proteins: Secondary | ICD-10-CM | POA: Insufficient documentation

## 2013-02-28 DIAGNOSIS — I513 Intracardiac thrombosis, not elsewhere classified: Secondary | ICD-10-CM | POA: Diagnosis present

## 2013-02-28 DIAGNOSIS — R7989 Other specified abnormal findings of blood chemistry: Secondary | ICD-10-CM

## 2013-02-28 DIAGNOSIS — I2510 Atherosclerotic heart disease of native coronary artery without angina pectoris: Secondary | ICD-10-CM

## 2013-02-28 DIAGNOSIS — I4510 Unspecified right bundle-branch block: Secondary | ICD-10-CM

## 2013-02-28 DIAGNOSIS — N189 Chronic kidney disease, unspecified: Secondary | ICD-10-CM

## 2013-02-28 DIAGNOSIS — I219 Acute myocardial infarction, unspecified: Secondary | ICD-10-CM

## 2013-02-28 DIAGNOSIS — I160 Hypertensive urgency: Secondary | ICD-10-CM | POA: Diagnosis present

## 2013-02-28 DIAGNOSIS — I5043 Acute on chronic combined systolic (congestive) and diastolic (congestive) heart failure: Principal | ICD-10-CM

## 2013-02-28 HISTORY — DX: Atherosclerotic heart disease of native coronary artery without angina pectoris: I25.10

## 2013-02-28 HISTORY — DX: Non-ST elevation (NSTEMI) myocardial infarction: I21.4

## 2013-02-28 LAB — BASIC METABOLIC PANEL
BUN: 62 mg/dL — AB (ref 6–23)
CO2: 22 mEq/L (ref 19–32)
Calcium: 8.3 mg/dL — ABNORMAL LOW (ref 8.4–10.5)
Chloride: 104 mEq/L (ref 96–112)
Creatinine, Ser: 3.77 mg/dL — ABNORMAL HIGH (ref 0.50–1.35)
GFR calc Af Amer: 16 mL/min — ABNORMAL LOW (ref >90)
GFR, EST NON AFRICAN AMERICAN: 14 mL/min — AB (ref >90)
Glucose, Bld: 107 mg/dL — ABNORMAL HIGH (ref 70–99)
POTASSIUM: 3.9 meq/L (ref 3.7–5.3)
Sodium: 143 mEq/L (ref 137–147)

## 2013-02-28 LAB — PRO B NATRIURETIC PEPTIDE: Pro B Natriuretic peptide (BNP): 3385 pg/mL — ABNORMAL HIGH (ref 0–450)

## 2013-02-28 MED ORDER — FUROSEMIDE 20 MG PO TABS
20.0000 mg | ORAL_TABLET | Freq: Every day | ORAL | Status: DC
  Administered 2013-03-01: 20 mg via ORAL
  Filled 2013-02-28: qty 1

## 2013-02-28 MED ORDER — VALACYCLOVIR HCL 500 MG PO TABS
1000.0000 mg | ORAL_TABLET | Freq: Every day | ORAL | Status: DC
  Administered 2013-02-28 – 2013-03-01 (×2): 1000 mg via ORAL
  Filled 2013-02-28 (×2): qty 2

## 2013-02-28 MED ORDER — ISOSORBIDE MONONITRATE ER 30 MG PO TB24
30.0000 mg | ORAL_TABLET | Freq: Every day | ORAL | Status: DC
  Administered 2013-02-28 – 2013-03-01 (×2): 30 mg via ORAL
  Filled 2013-02-28 (×2): qty 1

## 2013-02-28 MED ORDER — HYDRALAZINE HCL 50 MG PO TABS
75.0000 mg | ORAL_TABLET | Freq: Three times a day (TID) | ORAL | Status: DC
  Administered 2013-02-28 – 2013-03-01 (×3): 75 mg via ORAL
  Filled 2013-02-28 (×5): qty 1

## 2013-02-28 NOTE — Evaluation (Signed)
Physical Therapy Evaluation Patient Details Name: [name] MRN: [id number] DOB: Aug 25, 1928 Today's Date: 02/28/2013 Time: 6283-1517 PT Time Calculation (min): 24 min  PT Assessment / Plan / Recommendation History of Present Illness  77M with CAD s/p large anterior MI with late presentation c/b ICM (EF 27% on nuclear stress in 03/2009), CKD IV/V (due to gent toxicity), OA, gout, LE edema who presents with volume overload  Clinical Impression  Pt very pleasant and mobilizing well and will benefit from acute therapy to maximize activity tolerance and awareness of energy conservation. Pt with below deficits and will address these with therapy to increase independence. Recommend daily ambulation with nursing.      PT Assessment  Patient needs continued PT services    Follow Up Recommendations  No PT follow up    Does the patient have the potential to tolerate intense rehabilitation      Barriers to Discharge        Equipment Recommendations  None recommended by PT    Recommendations for Other Services     Frequency Min 3X/week    Precautions / Restrictions Precautions Precautions: Fall Precaution Comments: right shoe lift and reports 2 near falls at home   Pertinent Vitals/Pain sats 92-99% on RA with in room activity but with long hall ambulation drop to 85% on RA  HR 70-93 No pain      Mobility  Bed Mobility Overal bed mobility: Modified Independent General bed mobility comments: cueing for sequence to return to bed to be high enough up to prevent sliding Transfers Overall transfer level: Modified independent Ambulation/Gait Ambulation/Gait assistance: Supervision Ambulation Distance (Feet): 350 Feet Assistive device: Crutches (single crutch) Gait Pattern/deviations: Antalgic;Trunk flexed;Decreased stance time - right Gait velocity interpretation: at or above normal speed for age/gender General Gait Details: pt with cues throughout for breathing technique and  energy conservation as sats dropping to 85% on Ra with gait    Exercises     PT Diagnosis: Difficulty walking  PT Problem List: Decreased activity tolerance;Cardiopulmonary status limiting activity;Decreased mobility PT Treatment Interventions: Gait training;Stair training;Therapeutic activities;Functional mobility training;Patient/family education     PT Goals(Current goals can be found in the care plan section) Acute Rehab PT Goals 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 PT Goal Formulation: With patient Time For Goal Achievement: 03/14/13 Potential to Achieve Goals: Good  Visit Information  Last PT Received On: 02/28/13 Assistance Needed: +1 History of Present Illness: 77M with CAD s/p large anterior MI with late presentation c/b ICM (EF 27% on nuclear stress in 03/2009), CKD IV/V (due to gent toxicity), OA, gout, LE edema who presents with volume overload       Prior [place] expects to be discharged to:: Private residence Living Arrangements: Spouse/significant other Available Help at Discharge: Family;Available PRN/intermittently Type of Home: House Home Access: Stairs to enter Home Layout: Two level Alternate Level Stairs-Number of Steps: 3 Home Equipment: Grab bars - tub/shower;Crutches;Walker - 2 wheels;Cane - single point;Bedside commode Prior Function Level of Independence: Independent with assistive device(s) Comments: wife does housework but pt uses crutch for ambulation and 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, pt uses sock aid Communication Communication: No difficulties    Cognition  Cognition Arousal/Alertness: Awake/alert Behavior During Therapy: WFL for tasks assessed/performed Overall Cognitive Status: Within Functional Limits for tasks assessed    Extremity/Trunk Assessment Upper Extremity Assessment Upper Extremity Assessment: RUE deficits/detail RUE Deficits / Details: pt with limited  shoulder ROM longstanding grossly 80degrees flexion Lower Extremity Assessment Lower Extremity Assessment: RLE deficits/detail RLE Deficits / Details: RLE decreased leg length with limited hip flexion from previous THAs Cervical / Trunk Assessment Cervical / Trunk Assessment: Kyphotic   Balance    End of Session PT - End of Session Equipment Utilized During Treatment: Gait belt Activity Tolerance: Patient tolerated treatment well Patient left: in bed;with call bell/phone within reach;with family/visitor present Nurse Communication: Mobility status  GP     Lanetta Inch Beth 02/28/2013, 1:46 PM  Elwyn Reach, [place]

## 2013-02-28 NOTE — ED Provider Notes (Signed)
Medical screening examination/treatment/procedure(s) were performed by a resident physician or non-physician practitioner and as the supervising physician I was immediately available for consultation/collaboration.  Lynne Leader, MD    Gregor Hams, MD 02/28/13 [phone]

## 2013-02-28 NOTE — Progress Notes (Signed)
Patient alert and oriented x4.  Patient denies any pain or shortness of breath at rest, but admits to dyspnea with exertion.  Vital signs stable.  Patient's wife at bedside throughout shift.  Patient and wife watched heart failure video this afternoon and stated they had no questions about the content.  Will continue to monitor.

## 2013-02-28 NOTE — Progress Notes (Signed)
Patient Name: [name] Date of Encounter: 02/28/2013   Principal Problem:   Acute on chronic combined systolic and diastolic congestive heart failure, NYHA class 4 Active Problems:   Hypertensive urgency   NSTEMI   CKD (chronic kidney disease), stage IV   CAD (coronary artery disease)   ? Flat Mural thrombus of heart   Right bundle branch block   SUBJECTIVE  Breathing much better overall.  Less lower ext edema.  -3.5 L this admission so far.  Wt down from 160->157 lbs..  Prev low was ~ 158 in 06/2010.  He says that at home he is typically in the low 160's.  He apparently has a h/o recurrent right ocular zoster infection and feels that he is having a breakout.  His eye doctor prescribes valtrex 1000mg  tid x 5 days for breakouts (not cr cl only 15.2 ml/min).  CURRENT MEDS . allopurinol  50 mg Oral Daily  . aspirin EC  81 mg Oral Daily  . clopidogrel  75 mg Oral Daily  . furosemide  20 mg Intravenous BID  . heparin  5,000 Units Subcutaneous Q8H  . hydrALAZINE  50 mg Oral TID  . ipratropium  0.5 mg Nebulization Q6H  . isosorbide mononitrate  30 mg Oral Daily  . levalbuterol  0.63 mg Nebulization Q6H  . metoprolol tartrate  25 mg Oral BID  . sodium chloride  3 mL Intravenous Q12H    OBJECTIVE  Filed Vitals:   02/28/13 0042 02/28/13 0656 02/28/13 0912 02/28/13 0953  BP: 141/79 149/78  146/72  Pulse: 60 64  75  Temp: 98.1 F (36.7 C) 98 F (36.7 C)    TempSrc:  Oral    Resp: 20 18  18   Height:      Weight:  157 lb 8 oz (71.442 kg)    SpO2: 98% 98% 97% 95%    Intake/Output Summary (Last 24 hours) at 02/28/13 1222 Last data filed at 02/28/13 1037  Gross per 24 hour  Intake    243 ml  Output   1900 ml  Net  -1657 ml   Filed Weights   02/26/13 2345 02/27/13 0558 02/28/13 0656  Weight: 160 lb 6.4 oz (72.757 kg) 158 lb 12.8 oz (72.031 kg) 157 lb 8 oz (71.442 kg)    PHYSICAL EXAM  General: Pleasant, NAD. Neuro: Alert and oriented X 3. Moves all extremities  spontaneously. Psych: Normal affect. HEENT:  R eye injected.  Neck: Supple without.  JVP ~ 12cm. Lungs:  Resp regular and unlabored, diminished breath sounds bilat intermittent exp wheeze. Heart: RRR, distant, no s3, s4, or murmurs. Abdomen: Soft, non-tender, non-distended, BS + x 4.  Extremities: No clubbing, cyanosis.  Trace bilat LE edema. DP/PT/Radials 1+ and equal bilaterally.  Accessory Clinical Findings  CBC  Recent Labs  02/26/13 2025 02/27/13 0110  WBC 5.1 4.6  NEUTROABS 3.5  --   HGB 12.5* 11.9*  HCT 37.7* 34.9*  MCV 94.7 93.8  PLT 169 283   Basic Metabolic Panel  Recent Labs  02/27/13 0630 02/28/13 0500  NA 140 143  K 4.3 3.9  CL 103 104  CO2 21 22  GLUCOSE 97 107*  BUN 52* 62*  CREATININE 3.56* 3.77*  CALCIUM 8.3* 8.3*   Liver Function Tests  Recent Labs  02/26/13 2025  AST 34  ALT 28  ALKPHOS 116  BILITOT <0.2*  PROT 6.3  ALBUMIN 3.2*   Cardiac Enzymes  Recent Labs  02/27/13 0110 02/27/13 0656 02/27/13  1425  CKTOTAL 255* 168 175  CKMB 11.7* 10.4* 11.2*  TROPONINI 0.42* 0.44* 0.36*   TELE  rsr  ECG  Rsr, 76, rbbb, antlat q's, no acute changes.  2D Echocardiogram 02/27/2013  Study Conclusions  - Left ventricle: The cavity size was normal. Wall thickness   was normal. Systolic function was mildly to moderately   reduced. The estimated ejection fraction was in the range   of 40% to 45%. Dyskinesis and aneurysmal deformity of the   inferior and septal segments of the apex; consistent with   infarction in the distribution of the left anterior   descending coronary artery. Moderate hypokinesis of the   mid-distalanteroseptal and anterior myocardium. There was   a possible, flat (mural), fixedthrombus. - Pulmonary arteries: Systolic pressure was mildly   increased. PA peak pressure: 108mm Hg (S). _____________   Radiology/Studies  Dg Chest 2 View  02/26/2013   CLINICAL DATA:  Shortness of breath, cough.  EXAM: CHEST  2 VIEW   COMPARISON:  08/06/2012  FINDINGS: Heart is borderline enlarged. Mild peribronchial thickening. Right basilar atelectasis. No visible effusions.  Nodular density in the right mid lung peripherally measuring approximately 13 mm in diameter. Cannot exclude pulmonary nodule. This could be further evaluated with chest CT.  No acute bony abnormality.  IMPRESSION: Mild bronchitic changes.  Right base atelectasis.  Nodular density peripherally in the right mid lung. This could be further evaluated with chest CT if felt clinically indicated.   Electronically Signed   By: Rolm Baptise M.D.   On: 02/26/2013 21:01   ASSESSMENT AND PLAN  1.  Acute on chronic combined systolic and diastolic chf: In setting of hypertensive emergency.  EF 40-45% by echo yesterday, stable since 06/2010 echo.  Wt down 3 lbs.  Minus 3.5 L for this admission.  He says that he typically runs in the low 160's, which puts him less than his usual dry weight.  BUN/Creat/CO2 have bumped on lasix 20mg  bid.  Will switch to lasix 20mg  PO daily.  Cont bb (he has been on short-acting metoprolol, consider switching to toprol xl), hydralazine, and nitrate.  No acei/arb 2/2 CKD IV.  Add nebulizer as breath sounds are very diminished with some wheezing.  Continue inhalers/nebs.  He has seen Dr. Melvyn Novas in the past and has a h/o asthma.  2.  Hypertensive urgency:  BP's trending 140's to 150's.  Push hydralazine to 75mg  TID.  Cont nitrate/bb.  3.  NSTEMI/CAD:  In setting of above.  No chest pain.  Flat troponin trend with peak of only 0.44.  CK-MB rise also minimal and flat.  Echo showed stable LV dysfxn, EF 40-45%.  Known LAD occlusion from cath 03/2007.  Last Myoview was 03/2009.  Suspect enzyme rise 2/2 #1.  No plan for further ischemic eval at this time, though could consider outpt fxnl testing, which would more than likely only serve to focus therapies as he is a poor cath candidate with CKD IV.  Cont asa, plavix, bb, nitrate.  He has prev been intolerant to  statins.  4.  CKD IV:  BUN/Creat/CO2 rising in setting of diuresis.  Received 20 of IV lasix this AM.  Change to oral to start tomorrow.  5.  ? LV mural thrombus:  See echo report above.  Will discuss with Drs. Berry/Brackbill.  Possible TEE to better eval tomorrow.  6.  Ocular Zoster:  Apparently recurrent.  He is having a flare now.  His outpt valtrex dose is 1G tid  x 5 days however given a creat cl of 15, this should be scaled back to 1G daily - will order.  Signed, Murray Hodgkins NP  Agree with Jorja Loa NP  Pt admitted with volume overload/CHF secondary to ISCM. EF 45%with ? Flat mural thrombus. Diuresing. CRI. Close to dry weight. Feeling better. Adjusting meds. Would benefit from CHF clinic. Agree with switching to PO diuretics. Prob home 24-48 hrs.

## 2013-02-28 NOTE — Progress Notes (Signed)
SATURATION QUALIFICATIONS: (This note is used to comply with regulatory documentation for home oxygen)  Patient Saturations on Room Air at Rest = 92%-94%  Patient Saturations on Room Air while Ambulating = 85%  Patient's saturations rose to 94% on room air at rest after ambulating with PT in hallway.

## 2013-03-01 DIAGNOSIS — I255 Ischemic cardiomyopathy: Secondary | ICD-10-CM | POA: Diagnosis present

## 2013-03-01 DIAGNOSIS — I5043 Acute on chronic combined systolic (congestive) and diastolic (congestive) heart failure: Secondary | ICD-10-CM

## 2013-03-01 LAB — BASIC METABOLIC PANEL
BUN: 71 mg/dL — AB (ref 6–23)
CO2: 23 mEq/L (ref 19–32)
CREATININE: 3.76 mg/dL — AB (ref 0.50–1.35)
Calcium: 8.2 mg/dL — ABNORMAL LOW (ref 8.4–10.5)
Chloride: 102 mEq/L (ref 96–112)
GFR calc Af Amer: 16 mL/min — ABNORMAL LOW (ref >90)
GFR, EST NON AFRICAN AMERICAN: 14 mL/min — AB (ref >90)
Glucose, Bld: 106 mg/dL — ABNORMAL HIGH (ref 70–99)
POTASSIUM: 4 meq/L (ref 3.7–5.3)
Sodium: 144 mEq/L (ref 137–147)

## 2013-03-01 MED ORDER — METOPROLOL TARTRATE 25 MG PO TABS: 25.0000 mg | ORAL_TABLET | Freq: Two times a day (BID) | ORAL | Status: DC

## 2013-03-01 MED ORDER — FUROSEMIDE 40 MG PO TABS: 40.0000 mg | ORAL_TABLET | Freq: Every day | ORAL | Status: DC

## 2013-03-01 MED ORDER — ISOSORBIDE MONONITRATE ER 30 MG PO TB24: 30.0000 mg | ORAL_TABLET | Freq: Every day | ORAL | Status: DC

## 2013-03-01 MED ORDER — HYDRALAZINE HCL 50 MG PO TABS: 75.0000 mg | ORAL_TABLET | Freq: Three times a day (TID) | ORAL | Status: DC

## 2013-03-01 MED ORDER — ACETAMINOPHEN 325 MG PO TABS: 650.0000 mg | ORAL_TABLET | ORAL | Status: DC | PRN

## 2013-03-01 MED ORDER — VALACYCLOVIR HCL 1 G PO TABS: 1000.0000 mg | ORAL_TABLET | Freq: Every day | ORAL | Status: DC

## 2013-03-01 NOTE — Progress Notes (Signed)
Subjective:  SOB improved   Objective:  Vital Signs in the last 24 hours: Temp:  [97.2 F (36.2 C)-98.6 F (37 C)] 97.2 F (36.2 C) (02/10 0623) Pulse Rate:  [70-76] 73 (02/10 0623) Resp:  [17-18] 17 (02/10 0623) BP: (140-145)/(64-89) 145/68 mmHg (02/10 0623) SpO2:  [91 %-96 %] 91 % (02/10 0857) Weight:  [156 lb 15.5 oz (71.2 kg)] 156 lb 15.5 oz (71.2 kg) (02/10 4970)  Intake/Output from previous day:  Intake/Output Summary (Last 24 hours) at 03/01/13 1008 Last data filed at 03/01/13 0533  Gross per 24 hour  Intake    720 ml  Output   1625 ml  Net   -905 ml    Physical Exam: General appearance: alert, cooperative and no distress Lungs: decreased breath sounds, Rt > Lt Heart: regular rate and rhythm Extremities: trace edema   Rate: 74  Rhythm: normal sinus rhythm  Lab Results:  Recent Labs  02/26/13 2025 02/27/13 0110  WBC 5.1 4.6  HGB 12.5* 11.9*  PLT 169 172    Recent Labs  02/28/13 0500 03/01/13 0305  NA 143 144  K 3.9 4.0  CL 104 102  CO2 22 23  GLUCOSE 107* 106*  BUN 62* 71*  CREATININE 3.77* 3.76*    Recent Labs  02/27/13 0656 02/27/13 1425  TROPONINI 0.44* 0.36*   No results found for this basename: INR,  in the last 72 hours  Imaging: Imaging results have been reviewed  Cardiac Studies:  Assessment/Plan:   Principal Problem:   Acute on chronic combined systolic and diastolic congestive heart failure, NYHA class 4 Active Problems:   Hypertensive urgency   NSTEMI (non-ST elevated myocardial infarction)- type 2   CKD (chronic kidney disease), stage IV   CAD (coronary artery disease)- MI in '09   Mural thrombus of heart-old, no need for anticoagulation   Cardiomyopathy, ischemic- EF 45% 2D 02/27/13   Right bundle branch block    PLAN: 80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 His wife also has health issues. Admitted with HTN, CHF, Type 2 NSTEMI. B/P under better control. Renal function at baseline. LVT on echo appears to be  old per Dr Mare Ferrari. Possible discharge- defer OP renal referral to Dr Mare Ferrari and Dr Nevada Crane.   Kerin Ransom PA-C Beeper [phone] 03/01/2013, 10:08 AM   Agree with note written by Kerin Ransom PAC  BP better. Diuresed 4L. Meds adjusted. Exam benign. CRI. OK for D/c. ROV with DR. Brackbill.  Lorretta Harp, M.D., [place], [facility], [place] [address]. [place], [place]  [address]  [phone] 03/01/2013 12:00 PM   Lorretta Harp 03/01/2013 11:56 AM

## 2013-03-01 NOTE — Progress Notes (Signed)
Patient discharged to home with wife.  IV removed prior to discharge; IV site clean, dry, and intact.  Discharge instructions, education, and medications discussed with patient and patient's wife.  Follow-up appointment set for patient.  Patient and patient's wife voiced understanding of information presented in discharge packet.

## 2013-03-01 NOTE — Progress Notes (Signed)
Utilization Review Completed Milea Klink J. Lyn Joens, RN, BSN, NCM [phone]  

## 2013-03-01 NOTE — Plan of Care (Signed)
Problem: Phase I Progression Outcomes Goal: EF % per last Echo/documented,Core Reminder form on chart Outcome: Completed/Met Date Met:  03/01/13 EF 40-45%

## 2013-03-02 ENCOUNTER — Encounter (HOSPITAL_COMMUNITY): Payer: Self-pay | Admitting: Emergency Medicine

## 2013-03-02 ENCOUNTER — Inpatient Hospital Stay (HOSPITAL_COMMUNITY)
Admission: EM | Admit: 2013-03-02 | Discharge: 2013-03-07 | Disposition: A | Discharge status: Home / Self Care | Payer: Medicare Other | Source: Home / Self Care | Attending: Internal Medicine | Admitting: Internal Medicine

## 2013-03-02 ENCOUNTER — Emergency Department (HOSPITAL_COMMUNITY): Payer: Medicare Other

## 2013-03-02 ENCOUNTER — Telehealth: Payer: Self-pay | Admitting: Cardiology

## 2013-03-02 DIAGNOSIS — I509 Heart failure, unspecified: Secondary | ICD-10-CM

## 2013-03-02 DIAGNOSIS — J101 Influenza due to other identified influenza virus with other respiratory manifestations: Secondary | ICD-10-CM

## 2013-03-02 DIAGNOSIS — Z96659 Presence of unspecified artificial knee joint: Secondary | ICD-10-CM

## 2013-03-02 DIAGNOSIS — Z7982 Long term (current) use of aspirin: Secondary | ICD-10-CM

## 2013-03-02 DIAGNOSIS — M1990 Unspecified osteoarthritis, unspecified site: Secondary | ICD-10-CM

## 2013-03-02 DIAGNOSIS — I5043 Acute on chronic combined systolic (congestive) and diastolic (congestive) heart failure: Secondary | ICD-10-CM

## 2013-03-02 DIAGNOSIS — I4510 Unspecified right bundle-branch block: Secondary | ICD-10-CM

## 2013-03-02 DIAGNOSIS — J1100 Influenza due to unidentified influenza virus with unspecified type of pneumonia: Secondary | ICD-10-CM | POA: Diagnosis present

## 2013-03-02 DIAGNOSIS — I119 Hypertensive heart disease without heart failure: Secondary | ICD-10-CM

## 2013-03-02 DIAGNOSIS — J9600 Acute respiratory failure, unspecified whether with hypoxia or hypercapnia: Secondary | ICD-10-CM | POA: Diagnosis present

## 2013-03-02 DIAGNOSIS — N039 Chronic nephritic syndrome with unspecified morphologic changes: Secondary | ICD-10-CM | POA: Diagnosis present

## 2013-03-02 DIAGNOSIS — I2510 Atherosclerotic heart disease of native coronary artery without angina pectoris: Secondary | ICD-10-CM

## 2013-03-02 DIAGNOSIS — I2589 Other forms of chronic ischemic heart disease: Secondary | ICD-10-CM

## 2013-03-02 DIAGNOSIS — N185 Chronic kidney disease, stage 5: Secondary | ICD-10-CM | POA: Diagnosis present

## 2013-03-02 DIAGNOSIS — I252 Old myocardial infarction: Secondary | ICD-10-CM

## 2013-03-02 DIAGNOSIS — R0602 Shortness of breath: Secondary | ICD-10-CM | POA: Diagnosis present

## 2013-03-02 DIAGNOSIS — M109 Gout, unspecified: Secondary | ICD-10-CM

## 2013-03-02 DIAGNOSIS — N184 Chronic kidney disease, stage 4 (severe): Secondary | ICD-10-CM | POA: Diagnosis present

## 2013-03-02 DIAGNOSIS — I129 Hypertensive chronic kidney disease with stage 1 through stage 4 chronic kidney disease, or unspecified chronic kidney disease: Secondary | ICD-10-CM

## 2013-03-02 DIAGNOSIS — J9601 Acute respiratory failure with hypoxia: Secondary | ICD-10-CM

## 2013-03-02 DIAGNOSIS — J189 Pneumonia, unspecified organism: Secondary | ICD-10-CM

## 2013-03-02 DIAGNOSIS — I10 Essential (primary) hypertension: Secondary | ICD-10-CM

## 2013-03-02 DIAGNOSIS — N189 Chronic kidney disease, unspecified: Secondary | ICD-10-CM | POA: Diagnosis present

## 2013-03-02 DIAGNOSIS — D631 Anemia in chronic kidney disease: Secondary | ICD-10-CM

## 2013-03-02 DIAGNOSIS — I259 Chronic ischemic heart disease, unspecified: Secondary | ICD-10-CM

## 2013-03-02 DIAGNOSIS — I214 Non-ST elevation (NSTEMI) myocardial infarction: Secondary | ICD-10-CM

## 2013-03-02 DIAGNOSIS — I255 Ischemic cardiomyopathy: Secondary | ICD-10-CM | POA: Diagnosis present

## 2013-03-02 DIAGNOSIS — I513 Intracardiac thrombosis, not elsewhere classified: Secondary | ICD-10-CM

## 2013-03-02 LAB — BASIC METABOLIC PANEL
BUN: 66 mg/dL — ABNORMAL HIGH (ref 6–23)
CHLORIDE: 99 meq/L (ref 96–112)
CO2: 26 mEq/L (ref 19–32)
Calcium: 9.1 mg/dL (ref 8.4–10.5)
Creatinine, Ser: 3.9 mg/dL — ABNORMAL HIGH (ref 0.50–1.35)
GFR calc Af Amer: 15 mL/min — ABNORMAL LOW (ref >90)
GFR, EST NON AFRICAN AMERICAN: 13 mL/min — AB (ref >90)
GLUCOSE: 120 mg/dL — AB (ref 70–99)
Potassium: 5.2 mEq/L (ref 3.7–5.3)
SODIUM: 142 meq/L (ref 137–147)

## 2013-03-02 LAB — CBC
HCT: 40.6 % (ref 39.0–52.0)
HEMOGLOBIN: 13.7 g/dL (ref 13.0–17.0)
MCH: 31.7 pg (ref 26.0–34.0)
MCHC: 33.7 g/dL (ref 30.0–36.0)
MCV: 94 fL (ref 78.0–100.0)
Platelets: 231 10*3/uL (ref 150–400)
RBC: 4.32 MIL/uL (ref 4.22–5.81)
RDW: 13.6 % (ref 11.5–15.5)
WBC: 10.7 10*3/uL — AB (ref 4.0–10.5)

## 2013-03-02 LAB — URINALYSIS, ROUTINE W REFLEX MICROSCOPIC
BILIRUBIN URINE: NEGATIVE
Glucose, UA: NEGATIVE mg/dL
HGB URINE DIPSTICK: NEGATIVE
Ketones, ur: NEGATIVE mg/dL
Leukocytes, UA: NEGATIVE
NITRITE: NEGATIVE
Protein, ur: 100 mg/dL — AB
Specific Gravity, Urine: 1.012 (ref 1.005–1.030)
UROBILINOGEN UA: 0.2 mg/dL (ref 0.0–1.0)
pH: 6 (ref 5.0–8.0)

## 2013-03-02 LAB — URINE MICROSCOPIC-ADD ON

## 2013-03-02 LAB — TROPONIN I: Troponin I: <0.3 ng/mL (ref <0.30)

## 2013-03-02 LAB — PRO B NATRIURETIC PEPTIDE: Pro B Natriuretic peptide (BNP): 2760 pg/mL — ABNORMAL HIGH (ref 0–450)

## 2013-03-02 LAB — POCT I-STAT TROPONIN I: TROPONIN I, POC: 0.11 ng/mL — AB (ref 0.00–0.08)

## 2013-03-02 MED ORDER — FUROSEMIDE 10 MG/ML IJ SOLN
20.0000 mg | Freq: Once | INTRAMUSCULAR | Status: AC
  Administered 2013-03-02: 20 mg via INTRAVENOUS
  Filled 2013-03-02: qty 2

## 2013-03-02 MED ORDER — NITROGLYCERIN 0.4 MG SL SUBL
0.4000 mg | SUBLINGUAL_TABLET | SUBLINGUAL | Status: DC | PRN
  Administered 2013-03-02: 0.4 mg via SUBLINGUAL

## 2013-03-02 NOTE — Discharge Summary (Signed)
Patient ID: [name],  MRN: [id number], DOB/AGE: [birth year] 80 y.o.  Admit date: 02/26/2013 Discharge date: 03/02/2013  Primary Care Provider: Delphina Cahill, MD Primary Cardiologist: Dr Mare Ferrari  Discharge Diagnoses Principal Problem:   Acute on chronic combined systolic and diastolic congestive heart failure, NYHA class 4 Active Problems:   Hypertensive urgency   NSTEMI (non-ST elevated myocardial infarction)- type 2   CKD (chronic kidney disease), stage IV   CAD (coronary artery disease)- MI in '09   Mural thrombus of heart-old, no need for anticoagulation   Cardiomyopathy, ischemic- EF 45% 2D 02/27/13   Right bundle branch block     Hospital Course:  80 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 He has a history of an ischemic cardiomyopathy from a prior MI in 2009 in the setting of late presentation. He was admitted with accelerated HTN and CHF on 02/26/13. He ruled in for a NSTEMI (type 2). An echo revealed an EF of 40-45% with an old mural thrombus. He improved with diuresis and his renal function remained stable. Dr Gwenlyn Found felt he could be discharged 03/01/13.    Discharge Vitals:  Blood pressure 157/69, pulse 75, temperature 97.2 F (36.2 C), temperature source Oral, resp. rate 17, height $RemoveBe'5\' 3"'[id number]$  (1.6 m), weight 156 lb 15.5 oz (71.2 kg), SpO2 94.00%.    Labs: Results for orders placed during the hospital encounter of 02/26/13 (from the past 48 hour(s))  BASIC METABOLIC PANEL     Status: Abnormal   Collection Time    03/01/13  3:05 AM      Result Value Ref Range   Sodium 144  137 - 147 mEq/L   Potassium 4.0  3.7 - 5.3 mEq/L   Chloride 102  96 - 112 mEq/L   CO2 23  19 - 32 mEq/L   Glucose, Bld 106 (*) 70 - 99 mg/dL   BUN 71 (*) 6 - 23 mg/dL   Creatinine, Ser 3.76 (*) 0.50 - 1.35 mg/dL   Calcium 8.2 (*) 8.4 - 10.5 mg/dL   GFR calc non Af Amer 14 (*) >90 mL/min   GFR calc Af Amer 16 (*) >90 mL/min   Comment: (NOTE)     The eGFR has been  calculated using the CKD EPI equation.     This calculation has not been validated in all clinical situations.     eGFR's persistently <90 mL/min signify possible Chronic Kidney     Disease.    Disposition:  Follow-up Information   Follow up with Truitt Merle, NP On 03/18/2013. (8am)    Specialty:  Nurse Practitioner   Contact information:   [address] [place] [address] [phone]       Discharge Medications:    Medication List         acetaminophen 325 MG tablet  Commonly known as:  TYLENOL  Take 2 tablets (650 mg total) by mouth every 4 (four) hours as needed for headache or mild pain.     albuterol 108 (90 BASE) MCG/ACT inhaler  Commonly known as:  PROVENTIL HFA;VENTOLIN HFA  Inhale 2 puffs into the lungs every 6 (six) hours as needed for wheezing or shortness of breath.     allopurinol 100 MG tablet  Commonly known as:  ZYLOPRIM  Take 50 mg by mouth daily. 1/2 tablet daily     aspirin 81 MG tablet  Take 81 mg by mouth every other day.     clopidogrel 75  MG tablet  Commonly known as:  PLAVIX  Take 1 tablet (75 mg total) by mouth daily.     furosemide 40 MG tablet  Commonly known as:  LASIX  Take 1 tablet (40 mg total) by mouth daily.     hydrALAZINE 50 MG tablet  Commonly known as:  APRESOLINE  Take 1.5 tablets (75 mg total) by mouth 3 (three) times daily.     isosorbide mononitrate 30 MG 24 hr tablet  Commonly known as:  IMDUR  Take 1 tablet (30 mg total) by mouth daily.     metoprolol tartrate 25 MG tablet  Commonly known as:  LOPRESSOR  Take 1 tablet (25 mg total) by mouth 2 (two) times daily.     nitroGLYCERIN 0.4 MG SL tablet  Commonly known as:  NITROSTAT  Place 1 tablet (0.4 mg total) under the tongue every 5 (five) minutes as needed.     valACYclovir 1000 MG tablet  Commonly known as:  VALTREX  Take 1 tablet (1,000 mg total) by mouth daily.         Duration of Discharge Encounter: Greater than 30 minutes  including physician time.  Angelena Form PA-C 03/02/2013 5:52 PM

## 2013-03-02 NOTE — ED Notes (Signed)
[facility], Pt left to go home, pt has some swelling in leg, increased trouble breathing, Coughing is constant, Pt has home O2 uses 2liters, Pt has exp wheeze and rhonchi

## 2013-03-02 NOTE — ED Notes (Signed)
Discharged from hospital yesterday for CHF.  C/o increased sob and non-productive cough.  Denies pain.  Pt speaking in short phrases.

## 2013-03-02 NOTE — Telephone Encounter (Signed)
New problem ° ° °Pt having sob °

## 2013-03-02 NOTE — Telephone Encounter (Signed)
Spoke with wife and patient is on his way back to the emergency room stating she felt that he was discharged too soon.

## 2013-03-02 NOTE — Telephone Encounter (Signed)
Pt was Discharged from the hospital  yesterday with CHF  Pt is coughing up more today and have  swelling of his feet . Pt is taking 40 mg lasix once a day pt has a dry hocking cough as before he went to the hospital ,and he gets SOB when he does cough. Pt 's cough is not as bad as when he went to the hospital but she expect that the cough and SOB would be better. Daughter states that she does not know what to expect. Daughter thinks that pt needs oxygen. Daughter is aware that I will send this message to MD and his nurse for recommendations

## 2013-03-02 NOTE — ED Notes (Signed)
Family at bedside. 

## 2013-03-02 NOTE — ED Provider Notes (Signed)
CSN: [id number]     Arrival date & time 03/02/13  1857 History   First MD Initiated Contact with Patient 03/02/13 2013     Chief Complaint  Patient presents with  . Shortness of Breath     (Consider location/radiation/quality/duration/timing/severity/associated sxs/prior Treatment) Patient is a 80 y.o. male presenting with shortness of breath. The history is provided by the patient and a relative.  Shortness of Breath Severity:  Severe Onset quality:  Sudden Duration:  1 day Timing:  Constant Progression:  Unchanged Chronicity:  New Context: activity   Relieved by:  Nothing Worsened by:  Nothing tried Ineffective treatments:  Diuretics Associated symptoms: cough   Associated symptoms: no abdominal pain, no chest pain, no fever, no headaches, no rash, no sore throat, no sputum production, no swollen glands and no vomiting   Risk factors: no hx of cancer and no hx of PE/DVT     80 year old male with a chief complaint shortness breath. Patient was recently admitted and discharged yesterday for the same. Patient will hear found to have a CHF exacerbation and an N. STEMI. Patient improved and was cleared by physical therapy and was discharged home. Patient states that this morning he woke up and felt like his breathing was just out as was previously. Patient oxygen saturation at 87% on room air. Patient improved on 3 L nasal cannula. Patient coughing but not producing any sputum. Patient denies any runny nose any fevers chills.  Past Medical History  Diagnosis Date  . Hypertension   . Gout   . IHD (ischemic heart disease)     Remote MI in 2009 with late presentation; no reperfusion. Has large area of infarct in the apical anterior area on last nuclear in 2011. Managed medically  . Chronic renal insufficiency     creatinine ranges around 2.8  . OA (osteoarthritis) of knee     Left knee with revision and replacement of prosthetic L TKR  . 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 DOE  (dyspnea on exertion)   . Herpes zoster 12/2009  . Peripheral edema   . RBBB (right bundle branch block)   . History of BPH   . Cancer   . Renal insufficiency   . Asthma    Past Surgical History  Procedure Laterality Date  . Cardiac catheterization  03/24/2007  . Total knee arthroplasty      left knee  . Cardiovascular stress test  04/02/2009    EF 27% AND A LARGE AREA OF INFARCT IN THE APICAL ANTERIOR WITH MILD PERI-INFARCT ISCHEMIA  . Transthoracic echocardiogram  04/04/2009    EF 45-50%  . Knee surgery  1998    left knee   Family History  Problem Relation Age of Onset  . Coronary artery disease Mother   . Emphysema Father     never smoked, worked as a Psychologist, sport and exercise   History  Substance Use Topics  . Smoking status: Never Smoker   . Smokeless tobacco: Never Used  . Alcohol Use: No    Review of Systems  Constitutional: Negative for fever and chills.  HENT: Negative for congestion, facial swelling and sore throat.   Eyes: Negative for discharge and visual disturbance.  Respiratory: Positive for cough and shortness of breath. Negative for sputum production.   Cardiovascular: Negative for chest pain and palpitations.  Gastrointestinal: Negative for vomiting, abdominal pain and diarrhea.  Musculoskeletal: Negative for arthralgias and myalgias.  Skin: Negative for color change and rash.  Neurological: Negative  for tremors, syncope and headaches.  Psychiatric/Behavioral: Negative for confusion and dysphoric mood.      Allergies  Crestor; Gentamycin; Hctz; and Lipitor  Home Medications   Current Outpatient Rx  Name  Route  Sig  Dispense  Refill  . acetaminophen (TYLENOL) 325 MG tablet   Oral   Take 2 tablets (650 mg total) by mouth every 4 (four) hours as needed for headache or mild pain.         Marland Kitchen albuterol (PROVENTIL HFA;VENTOLIN HFA) 108 (90 BASE) MCG/ACT inhaler   Inhalation   Inhale 2 puffs into the lungs every 6 (six) hours as needed for wheezing or shortness of  breath.         . allopurinol (ZYLOPRIM) 100 MG tablet   Oral   Take 50 mg by mouth daily. 1/2 tablet daily         . aspirin 81 MG tablet   Oral   Take 81 mg by mouth every other day.           . clopidogrel (PLAVIX) 75 MG tablet   Oral   Take 1 tablet (75 mg total) by mouth daily.   90 tablet   3   . furosemide (LASIX) 40 MG tablet   Oral   Take 1 tablet (40 mg total) by mouth daily.   30 tablet   11   . hydrALAZINE (APRESOLINE) 50 MG tablet   Oral   Take 1.5 tablets (75 mg total) by mouth 3 (three) times daily.   100 tablet   11   . isosorbide mononitrate (IMDUR) 30 MG 24 hr tablet   Oral   Take 1 tablet (30 mg total) by mouth daily.   30 tablet   11   . metoprolol tartrate (LOPRESSOR) 25 MG tablet   Oral   Take 1 tablet (25 mg total) by mouth 2 (two) times daily.   60 tablet   11   . nitroGLYCERIN (NITROSTAT) 0.4 MG SL tablet   Sublingual   Place 1 tablet (0.4 mg total) under the tongue every 5 (five) minutes as needed.   25 tablet   5   . valACYclovir (VALTREX) 1000 MG tablet   Oral   Take 1 tablet (1,000 mg total) by mouth daily.   3 tablet   0    BP 194/99  Pulse 88  Temp(Src) 98.3 F (36.8 C) (Oral)  Resp 32  SpO2 93% Physical Exam  Constitutional: He is oriented to person, place, and time. He appears well-developed and well-nourished.  HENT:  Head: Normocephalic and atraumatic.  Eyes: EOM are normal. Pupils are equal, round, and reactive to light.  Neck: Normal range of motion. Neck supple. No JVD present.  Cardiovascular: Normal rate and regular rhythm.  Exam reveals no gallop and no friction rub.   No murmur heard. JVD midway up the neck   Pulmonary/Chest: He is in respiratory distress. He has no wheezes. He has no rales. He exhibits no tenderness.  Abdominal: He exhibits no distension. There is no rebound and no guarding.  Musculoskeletal: Normal range of motion. He exhibits edema (2+ peripheral edema to the mid shin).   Neurological: He is alert and oriented to person, place, and time.  Skin: No rash noted. No pallor.  Psychiatric: He has a normal mood and affect. His behavior is normal.    ED Course  Procedures (including critical care time) Labs Review Labs Reviewed  CBC - Abnormal; Notable for the following:  WBC 10.7 (*)    All other components within normal limits  POCT I-STAT TROPONIN I - Abnormal; Notable for the following:    Troponin i, poc 0.11 (*)    All other components within normal limits  BASIC METABOLIC PANEL  PRO B NATRIURETIC PEPTIDE  URINALYSIS, ROUTINE W REFLEX MICROSCOPIC   Imaging Review No results found.  EKG Interpretation   None       MDM   Final diagnoses:  None    80 year old male with shortness of breath. Patient tachypnic and hypoxic. Elevated BNP but decreased from admission labs. Patient with improved aeration on portable chest x-ray. Renal function unchanged. Patient with elevated white count. Patient also with elevated troponin. Troponin appears to be trending down from his admission.  Spoke with cardiology on the phone. They do not feel like this is a recurrent cardiac event. They recommend continuing with VQ scan and admission to medicine.  Deno Etienne, MD 03/03/13 [phone]

## 2013-03-02 NOTE — Telephone Encounter (Signed)
His recorded oxygen saturations in the hospital prior to discharge were satisfactory.  Otherwise his hospital physicians would have sent him home on oxygen. Since he is having more shortness of breath and swelling I want him to increase his Lasix to 80 mg daily for 2 days and then try cutting back to 40 mg a day.

## 2013-03-03 ENCOUNTER — Inpatient Hospital Stay (HOSPITAL_COMMUNITY): Payer: Medicare Other

## 2013-03-03 ENCOUNTER — Encounter (HOSPITAL_COMMUNITY): Payer: Self-pay | Admitting: Internal Medicine

## 2013-03-03 DIAGNOSIS — J101 Influenza due to other identified influenza virus with other respiratory manifestations: Secondary | ICD-10-CM

## 2013-03-03 DIAGNOSIS — J9600 Acute respiratory failure, unspecified whether with hypoxia or hypercapnia: Secondary | ICD-10-CM

## 2013-03-03 DIAGNOSIS — I2510 Atherosclerotic heart disease of native coronary artery without angina pectoris: Secondary | ICD-10-CM

## 2013-03-03 DIAGNOSIS — R0602 Shortness of breath: Secondary | ICD-10-CM | POA: Diagnosis present

## 2013-03-03 DIAGNOSIS — J9601 Acute respiratory failure with hypoxia: Secondary | ICD-10-CM

## 2013-03-03 DIAGNOSIS — J111 Influenza due to unidentified influenza virus with other respiratory manifestations: Secondary | ICD-10-CM

## 2013-03-03 DIAGNOSIS — J189 Pneumonia, unspecified organism: Secondary | ICD-10-CM

## 2013-03-03 DIAGNOSIS — N184 Chronic kidney disease, stage 4 (severe): Secondary | ICD-10-CM

## 2013-03-03 DIAGNOSIS — I509 Heart failure, unspecified: Secondary | ICD-10-CM

## 2013-03-03 DIAGNOSIS — I5043 Acute on chronic combined systolic (congestive) and diastolic (congestive) heart failure: Principal | ICD-10-CM

## 2013-03-03 HISTORY — DX: Pneumonia, unspecified organism: J18.9

## 2013-03-03 HISTORY — DX: Influenza due to other identified influenza virus with other respiratory manifestations: J10.1

## 2013-03-03 HISTORY — DX: Acute respiratory failure with hypoxia: J96.01

## 2013-03-03 LAB — CBC WITH DIFFERENTIAL/PLATELET
Basophils Absolute: 0 10*3/uL (ref 0.0–0.1)
Basophils Relative: 0 % (ref 0–1)
EOS PCT: 0 % (ref 0–5)
Eosinophils Absolute: 0 10*3/uL (ref 0.0–0.7)
HCT: 36.5 % — ABNORMAL LOW (ref 39.0–52.0)
Hemoglobin: 12.4 g/dL — ABNORMAL LOW (ref 13.0–17.0)
LYMPHS ABS: 0.7 10*3/uL (ref 0.7–4.0)
Lymphocytes Relative: 4 % — ABNORMAL LOW (ref 12–46)
MCH: 31.6 pg (ref 26.0–34.0)
MCHC: 34 g/dL (ref 30.0–36.0)
MCV: 93.1 fL (ref 78.0–100.0)
Monocytes Absolute: 1.9 10*3/uL — ABNORMAL HIGH (ref 0.1–1.0)
Monocytes Relative: 10 % (ref 3–12)
Neutro Abs: 16.3 10*3/uL — ABNORMAL HIGH (ref 1.7–7.7)
Neutrophils Relative %: 86 % — ABNORMAL HIGH (ref 43–77)
PLATELETS: 225 10*3/uL (ref 150–400)
RBC: 3.92 MIL/uL — ABNORMAL LOW (ref 4.22–5.81)
RDW: 13.7 % (ref 11.5–15.5)
WBC: 18.9 10*3/uL — ABNORMAL HIGH (ref 4.0–10.5)

## 2013-03-03 LAB — COMPREHENSIVE METABOLIC PANEL
ALT: 29 U/L (ref 0–53)
AST: 27 U/L (ref 0–37)
Albumin: 3.3 g/dL — ABNORMAL LOW (ref 3.5–5.2)
Alkaline Phosphatase: 109 U/L (ref 39–117)
BUN: 69 mg/dL — AB (ref 6–23)
CALCIUM: 8.9 mg/dL (ref 8.4–10.5)
CO2: 25 meq/L (ref 19–32)
CREATININE: 3.9 mg/dL — AB (ref 0.50–1.35)
Chloride: 99 mEq/L (ref 96–112)
GFR, EST AFRICAN AMERICAN: 15 mL/min — AB (ref >90)
GFR, EST NON AFRICAN AMERICAN: 13 mL/min — AB (ref >90)
GLUCOSE: 151 mg/dL — AB (ref 70–99)
Potassium: 5.2 mEq/L (ref 3.7–5.3)
Sodium: 138 mEq/L (ref 137–147)
Total Bilirubin: 0.4 mg/dL (ref 0.3–1.2)
Total Protein: 6.5 g/dL (ref 6.0–8.3)

## 2013-03-03 LAB — PROCALCITONIN: Procalcitonin: 1.94 ng/mL

## 2013-03-03 LAB — TROPONIN I
Troponin I: <0.3 ng/mL (ref <0.30)
Troponin I: <0.3 ng/mL (ref <0.30)
Troponin I: <0.3 ng/mL (ref <0.30)

## 2013-03-03 LAB — INFLUENZA PANEL BY PCR (TYPE A & B)
H1N1 flu by pcr: NOT DETECTED
Influenza A By PCR: POSITIVE — AB
Influenza B By PCR: NEGATIVE

## 2013-03-03 LAB — HEPARIN LEVEL (UNFRACTIONATED)
HEPARIN UNFRACTIONATED: 0.33 [IU]/mL (ref 0.30–0.70)
HEPARIN UNFRACTIONATED: 0.24 [IU]/mL — AB (ref 0.30–0.70)

## 2013-03-03 LAB — TSH: TSH: 1.032 u[IU]/mL (ref 0.350–4.500)

## 2013-03-03 LAB — STREP PNEUMONIAE URINARY ANTIGEN: Strep Pneumo Urinary Antigen: NEGATIVE

## 2013-03-03 MED ORDER — NITROGLYCERIN 0.4 MG SL SUBL: 0.4000 mg | SUBLINGUAL_TABLET | SUBLINGUAL | Status: DC | PRN

## 2013-03-03 MED ORDER — VANCOMYCIN HCL IN DEXTROSE 1-5 GM/200ML-% IV SOLN
1000.0000 mg | INTRAVENOUS | Status: DC
  Filled 2013-03-03 (×2): qty 200

## 2013-03-03 MED ORDER — ALBUTEROL SULFATE (2.5 MG/3ML) 0.083% IN NEBU
3.0000 mL | INHALATION_SOLUTION | Freq: Four times a day (QID) | RESPIRATORY_TRACT | Status: DC | PRN
  Administered 2013-03-04: 3 mL via RESPIRATORY_TRACT
  Filled 2013-03-03: qty 3

## 2013-03-03 MED ORDER — ASPIRIN 81 MG PO CHEW
81.0000 mg | CHEWABLE_TABLET | ORAL | Status: DC
  Administered 2013-03-03 – 2013-03-07 (×3): 81 mg via ORAL
  Filled 2013-03-03 (×3): qty 1

## 2013-03-03 MED ORDER — SODIUM CHLORIDE 0.9 % IJ SOLN
3.0000 mL | Freq: Two times a day (BID) | INTRAMUSCULAR | Status: DC
  Administered 2013-03-03: 3 mL via INTRAVENOUS
  Administered 2013-03-03: 16:00:00 via INTRAVENOUS
  Administered 2013-03-04 (×2): 3 mL via INTRAVENOUS
  Administered 2013-03-05: 10 mL via INTRAVENOUS
  Administered 2013-03-05 – 2013-03-07 (×4): 3 mL via INTRAVENOUS

## 2013-03-03 MED ORDER — HEPARIN SODIUM (PORCINE) 5000 UNIT/ML IJ SOLN
5000.0000 [IU] | Freq: Three times a day (TID) | INTRAMUSCULAR | Status: DC
  Administered 2013-03-04 – 2013-03-07 (×10): 5000 [IU] via SUBCUTANEOUS
  Filled 2013-03-03 (×14): qty 1

## 2013-03-03 MED ORDER — OSELTAMIVIR PHOSPHATE 75 MG PO CAPS: 75.0000 mg | ORAL_CAPSULE | Freq: Two times a day (BID) | ORAL | Status: DC

## 2013-03-03 MED ORDER — ACETAMINOPHEN 650 MG RE SUPP: 650.0000 mg | Freq: Four times a day (QID) | RECTAL | Status: DC | PRN

## 2013-03-03 MED ORDER — DEXTROSE 5 % IV SOLN
1.0000 g | INTRAVENOUS | Status: DC
  Filled 2013-03-03 (×2): qty 1

## 2013-03-03 MED ORDER — HEPARIN BOLUS VIA INFUSION
3000.0000 [IU] | Freq: Once | INTRAVENOUS | Status: AC
  Administered 2013-03-03: 3000 [IU] via INTRAVENOUS
  Filled 2013-03-03: qty 3000

## 2013-03-03 MED ORDER — ONDANSETRON HCL 4 MG/2ML IJ SOLN: 4.0000 mg | Freq: Three times a day (TID) | INTRAMUSCULAR | Status: AC | PRN

## 2013-03-03 MED ORDER — HEPARIN (PORCINE) IN NACL 100-0.45 UNIT/ML-% IJ SOLN
1100.0000 [IU]/h | INTRAMUSCULAR | Status: DC
  Administered 2013-03-03 (×2): 1100 [IU]/h via INTRAVENOUS
  Filled 2013-03-03 (×3): qty 250

## 2013-03-03 MED ORDER — SODIUM CHLORIDE 0.9 % IJ SOLN
3.0000 mL | Freq: Two times a day (BID) | INTRAMUSCULAR | Status: DC
  Administered 2013-03-03 – 2013-03-04 (×3): 3 mL via INTRAVENOUS
  Administered 2013-03-05: 10 mL via INTRAVENOUS
  Administered 2013-03-05 – 2013-03-06 (×3): 3 mL via INTRAVENOUS

## 2013-03-03 MED ORDER — HYDRALAZINE HCL 50 MG PO TABS
75.0000 mg | ORAL_TABLET | Freq: Three times a day (TID) | ORAL | Status: DC
  Administered 2013-03-03 – 2013-03-07 (×13): 75 mg via ORAL
  Filled 2013-03-03 (×15): qty 1

## 2013-03-03 MED ORDER — DEXTROSE 5 % IV SOLN
1.0000 g | INTRAVENOUS | Status: DC
  Administered 2013-03-03 – 2013-03-04 (×2): 1 g via INTRAVENOUS
  Filled 2013-03-03 (×3): qty 1

## 2013-03-03 MED ORDER — ISOSORBIDE MONONITRATE ER 30 MG PO TB24
30.0000 mg | ORAL_TABLET | Freq: Every day | ORAL | Status: DC
  Administered 2013-03-03 – 2013-03-07 (×5): 30 mg via ORAL
  Filled 2013-03-03 (×5): qty 1

## 2013-03-03 MED ORDER — ACETAMINOPHEN 325 MG PO TABS: 650.0000 mg | ORAL_TABLET | Freq: Four times a day (QID) | ORAL | Status: DC | PRN

## 2013-03-03 MED ORDER — ONDANSETRON HCL 4 MG/2ML IJ SOLN: 4.0000 mg | Freq: Four times a day (QID) | INTRAMUSCULAR | Status: DC | PRN

## 2013-03-03 MED ORDER — METOPROLOL TARTRATE 25 MG PO TABS
25.0000 mg | ORAL_TABLET | Freq: Two times a day (BID) | ORAL | Status: DC
  Administered 2013-03-03 – 2013-03-07 (×10): 25 mg via ORAL
  Filled 2013-03-03 (×12): qty 1

## 2013-03-03 MED ORDER — TECHNETIUM TO 99M ALBUMIN AGGREGATED
3.0000 | Freq: Once | INTRAVENOUS | Status: AC | PRN
  Administered 2013-03-03: 3 via INTRAVENOUS

## 2013-03-03 MED ORDER — ONDANSETRON HCL 4 MG PO TABS: 4.0000 mg | ORAL_TABLET | Freq: Four times a day (QID) | ORAL | Status: DC | PRN

## 2013-03-03 MED ORDER — OSELTAMIVIR PHOSPHATE 30 MG PO CAPS
30.0000 mg | ORAL_CAPSULE | Freq: Every day | ORAL | Status: AC
  Administered 2013-03-03 – 2013-03-07 (×5): 30 mg via ORAL
  Filled 2013-03-03 (×5): qty 1

## 2013-03-03 MED ORDER — ALLOPURINOL 100 MG PO TABS
50.0000 mg | ORAL_TABLET | Freq: Every day | ORAL | Status: DC
  Administered 2013-03-03 – 2013-03-07 (×5): 50 mg via ORAL
  Filled 2013-03-03 (×5): qty 0.5

## 2013-03-03 MED ORDER — VANCOMYCIN HCL IN DEXTROSE 1-5 GM/200ML-% IV SOLN
1000.0000 mg | INTRAVENOUS | Status: DC
  Administered 2013-03-03: 1000 mg via INTRAVENOUS
  Filled 2013-03-03 (×2): qty 200

## 2013-03-03 MED ORDER — TECHNETIUM TC 99M DIETHYLENETRIAME-PENTAACETIC ACID: 40.0000 | Freq: Once | INTRAVENOUS | Status: AC | PRN

## 2013-03-03 MED ORDER — CLOPIDOGREL BISULFATE 75 MG PO TABS
75.0000 mg | ORAL_TABLET | Freq: Every day | ORAL | Status: DC
  Administered 2013-03-03 – 2013-03-07 (×5): 75 mg via ORAL
  Filled 2013-03-03 (×5): qty 1

## 2013-03-03 MED ORDER — FUROSEMIDE 10 MG/ML IJ SOLN
20.0000 mg | Freq: Two times a day (BID) | INTRAMUSCULAR | Status: DC
  Filled 2013-03-03 (×2): qty 2

## 2013-03-03 NOTE — Progress Notes (Signed)
[facility]  [name] QVZ:[id number] DOB: Aug 19, 1928 DOA: 03/02/2013 PCP: Delphina Cahill, MD  Brief narrative: 80 year old male patient with known chronic systolic heart failure, chronic knee disease and hypertension as well as CAD. Discharge 2 days ago after treatment for hypertensive crisis with associated systolic heart failure and type II non-STEMI. Returns to the ER because of progressive shortness of breath since discharge as well as a nonproductive cough. No apparent fevers or chills endorse that at time of admission. Upon arrival to the ER he was found be acutely short of breath in the emergency room physician felt this may be related to heart failure CT was given Lasix 20 mg IV x1 dose. The ER physician also discussed the case with the on-call cardiologist. Once again his cardiac markers were mildly positive. Concerns were also for possible PE so he was started on empiric IV heparin and a VQ scan has been ordered. The patient did not have significant leukocytosis at presentation his white count was only 10,700. His BUN and creatinine were at his baseline readings. Her dialysis was negative. Patient eventually developed low-grade fever of 99.4 while in the emergency department.  Assessment/Plan: Active Problems:   Acute respiratory failure with hypoxia:  a) ? HCAP (healthcare-associated pneumonia)  b)  Influenza A - primary respiratory issues seem to be related to influenza but since recently hospitalized could have secondary bacterial pneumonia so will continue empiric antibiotic -Check Procalcitonin to help clarify -Begin Tamiflu -cont supportive care including oxygen and pulmonary toileting -VQ negative for PE -Check blood cultures    CKD (chronic kidney disease), stage IV -At baseline    Acute on chronic combined systolic and diastolic congestive heart failure, NYHA class 4 -Compensated -Due to hypoxemia and had a tiny degree of  decompensation at presentation -Lasix on hold due to suspected dehydration related to influenza    Cardiomyopathy, ischemic- EF 45% 2D 02/27/13 -If requires IV fluids would do so gently with low flow rate -Continue Apresoline, Imdur and Lopressor -BP soft so may need to make adjustments in above medications    CAD (coronary artery disease)- MI in '09 -No symptoms -Continue home medication including Plavix   DVT prophylaxis: IV heparin until PE excluded Code Status: Full Family Communication: Patient and daughter at bedside Disposition Plan/Expected LOS: Stepdown   Consultants: None  Procedures: None  Antibiotics: Maxipime 2/12 >>> Vancomycin 2/12 >>> Tamiflu 2/12 >>>  HPI/Subjective: Patient plans of generalized malaise, dry hacking cough and subjective fevers. Still having a degree of orthopnea when on room air  Objective: Blood pressure 103/54, pulse 71, temperature 98.3 F (36.8 C), temperature source Oral, resp. rate 19, height 5\' 3"  (1.6 m), weight 157 lb 10.1 oz (71.5 kg), SpO2 96.00%.  Intake/Output Summary (Last 24 hours) at 03/03/13 1257 Last data filed at 03/03/13 1000  Gross per 24 hour  Intake    364 ml  Output   1100 ml  Net   -736 ml     Exam: Followup exam. The patient admitted at 2:26 AM today.  Scheduled Meds:  Scheduled Meds: . allopurinol  50 mg Oral Daily  . aspirin  81 mg Oral QODAY  . ceFEPime (MAXIPIME) IV  1 g Intravenous Q24H  . clopidogrel  75 mg Oral Daily  . hydrALAZINE  75 mg Oral TID  . isosorbide mononitrate  30 mg Oral Daily  . metoprolol tartrate  25 mg Oral BID  . sodium chloride  3  mL Intravenous Q12H  . sodium chloride  3 mL Intravenous Q12H  . vancomycin  1,000 mg Intravenous Q48H   Continuous Infusions: . heparin 1,100 Units/hr (03/03/13 0800)    Data Reviewed: Basic Metabolic Panel:  Recent Labs Lab 02/27/13 0630 02/28/13 0500 03/01/13 0305 03/02/13 1929 03/03/13 0540  NA 140 143 144 142 138  K 4.3 3.9  4.0 5.2 5.2  CL 103 104 102 99 99  CO2 21 22 23 26 25   GLUCOSE 97 107* 106* 120* 151*  BUN 52* 62* 71* 66* 69*  CREATININE 3.56* 3.77* 3.76* 3.90* 3.90*  CALCIUM 8.3* 8.3* 8.2* 9.1 8.9   Liver Function Tests:  Recent Labs Lab 02/26/13 2025 03/03/13 0540  AST 34 27  ALT 28 29  ALKPHOS 116 109  BILITOT <0.2* 0.4  PROT 6.3 6.5  ALBUMIN 3.2* 3.3*   No results found for this basename: LIPASE, AMYLASE,  in the last 168 hours No results found for this basename: AMMONIA,  in the last 168 hours CBC:  Recent Labs Lab 02/26/13 2025 02/27/13 0110 03/02/13 1929 03/03/13 0540  WBC 5.1 4.6 10.7* 18.9*  NEUTROABS 3.5  --   --  16.3*  HGB 12.5* 11.9* 13.7 12.4*  HCT 37.7* 34.9* 40.6 36.5*  MCV 94.7 93.8 94.0 93.1  PLT 169 172 231 225   Cardiac Enzymes:  Recent Labs Lab 02/27/13 0110 02/27/13 0656 02/27/13 1425 03/02/13 1929 03/03/13 0540 03/03/13 0900  CKTOTAL 255* 168 175  --   --   --   CKMB 11.7* 10.4* 11.2*  --   --   --   TROPONINI 0.42* 0.44* 0.36* <0.30 <0.30 <0.30   BNP (last 3 results)  Recent Labs  02/26/13 2025 02/28/13 1614 03/02/13 1929  PROBNP 4115.0* 3385.0* 2760.0*   CBG: No results found for this basename: GLUCAP,  in the last 168 hours  Recent Results (from the past 240 hour(s))  MRSA PCR SCREENING     Status: None   Collection Time    02/27/13  7:23 AM      Result Value Ref Range Status   MRSA by PCR NEGATIVE  NEGATIVE Final   Comment:            The GeneXpert MRSA Assay (FDA     approved for NASAL specimens     only), is one component of a     comprehensive MRSA colonization     surveillance program. It is not     intended to diagnose MRSA     infection nor to guide or     monitor treatment for     MRSA infections.     Studies:  Recent x-ray studies have been reviewed in detail by the Attending Physician  Time spent :     Erin Hearing, [place] [facility] Office  [phone] Pager [phone]  **If unable to reach  the above provider after paging please contact the [place] @ [phone]  On-Call/Text Page:      [email or web address]      password [email or web address]  If 7PM-7AM, please contact night-coverage [email or web address] Password [facility] 03/03/2013, 12:57 PM   LOS: 1 day   I have examined the patient, reviewed the chart and modified the above note which I agree with.   Carden Teel,MD [phone] 03/03/2013, 5:29 PM

## 2013-03-03 NOTE — Progress Notes (Signed)
Attempted to place IV, unsuccessful IV team notified.

## 2013-03-03 NOTE — H&P (Signed)
[facility] History and Physical  [name]:[id number] DOB: 05/17/1928 DOA: 03/02/2013  Referring physician: ER physician. PCP: Delphina Cahill, MD   Chief Complaint: Shortness of breath.  HPI: [name] is a 80 y.o. male history of chronic systolic heart failure, CAD, hypertension, chronic kidney disease who was discharged 2 days ago after being admitted for hypertensive crisis, systolic heart failure and non-ST elevation MI presented the ER because of worsening shortness of breath since discharge. Patient also had some non-productive cough. Denies any fever chills or chest pain. In the ER patient was found to be acutely short of breath was given Lasix 20 mg IV. Patient has been admitted for further workup. ER physician had discussed with on-call cardiologist. Patient otherwise denies any nausea vomiting abdominal pain diarrhea fever chills. Cardiac markers point-of-care troponins mildly positive. Since patient had nonproductive cough influenza PCR has been sent and there was also concern for possible PE for which IV heparin has been empirically started and VQ scan ordered for a.m.   Review of Systems: As presented in the history of presenting illness, rest negative.  Past Medical History  Diagnosis Date  . Hypertension   . Gout   . IHD (ischemic heart disease)     Remote MI in 2009 with late presentation; no reperfusion. Has large area of infarct in the apical anterior area on last nuclear in 2011. Managed medically  . Chronic renal insufficiency     creatinine ranges around 2.8  . OA (osteoarthritis) of knee     Left knee with revision and replacement of prosthetic L TKR  . 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 DOE (dyspnea on exertion)   . Herpes zoster 12/2009  . Peripheral edema   . RBBB (right bundle branch block)   . History of BPH   . Cancer   . Renal insufficiency   . Asthma    Past Surgical History  Procedure Laterality Date  . Cardiac  catheterization  03/24/2007  . Total knee arthroplasty      left knee  . Cardiovascular stress test  04/02/2009    EF 27% AND A LARGE AREA OF INFARCT IN THE APICAL ANTERIOR WITH MILD PERI-INFARCT ISCHEMIA  . Transthoracic echocardiogram  04/04/2009    EF 45-50%  . Knee surgery  1998    left knee   Social History:  reports that he has never smoked. He has never used smokeless tobacco. He reports that he does not drink alcohol or use illicit drugs. Where does patient live home. Can patient participate in ADLs? Yes.  Allergies  Allergen Reactions  . Crestor [Rosuvastatin Calcium]     MUSCLE PAIN  . Gentamycin [Gentamicin]     Decreased kidney fx  . Hctz [Hydrochlorothiazide]     dizziness  . Lipitor [Atorvastatin Calcium]     MUSCLE PAIN    Family History:  Family History  Problem Relation Age of Onset  . Coronary artery disease Mother   . Emphysema Father     never smoked, worked as a Psychologist, sport and exercise      Prior to Admission medications   Medication Sig Start Date End Date Taking? Authorizing Provider  acetaminophen (TYLENOL) 325 MG tablet Take 2 tablets (650 mg total) by mouth every 4 (four) hours as needed for headache or mild pain. 03/01/13  Yes Luke K Kilroy, PA-C  albuterol (PROVENTIL HFA;VENTOLIN HFA) 108 (90 BASE) MCG/ACT inhaler Inhale 2 puffs into the lungs every 6 (six) hours as needed  for wheezing or shortness of breath.   Yes Historical Provider, MD  allopurinol (ZYLOPRIM) 100 MG tablet Take 50 mg by mouth daily. 1/2 tablet daily 01/06/12  Yes Darlin Coco, MD  aspirin 81 MG tablet Take 81 mg by mouth every other day.     Yes Historical Provider, MD  clopidogrel (PLAVIX) 75 MG tablet Take 1 tablet (75 mg total) by mouth daily. 09/14/12 09/14/13 Yes Darlin Coco, MD  furosemide (LASIX) 40 MG tablet Take 1 tablet (40 mg total) by mouth daily. 03/01/13 03/01/14 Yes Luke K Kilroy, PA-C  hydrALAZINE (APRESOLINE) 50 MG tablet Take 1.5 tablets (75 mg total) by mouth 3 (three) times  daily. 03/01/13  Yes Luke K Kilroy, PA-C  isosorbide mononitrate (IMDUR) 30 MG 24 hr tablet Take 1 tablet (30 mg total) by mouth daily. 03/01/13  Yes Luke K Kilroy, PA-C  metoprolol tartrate (LOPRESSOR) 25 MG tablet Take 1 tablet (25 mg total) by mouth 2 (two) times daily. 03/01/13  Yes Luke K Kilroy, PA-C  nitroGLYCERIN (NITROSTAT) 0.4 MG SL tablet Place 1 tablet (0.4 mg total) under the tongue every 5 (five) minutes as needed. 06/15/12  Yes Darlin Coco, MD  valACYclovir (VALTREX) 1000 MG tablet Take 1 tablet (1,000 mg total) by mouth daily. 03/02/13  Yes Erlene Quan, PA-C    Physical Exam: Filed Vitals:   03/02/13 2300 03/02/13 2330 03/02/13 2332 03/03/13 0100  BP: 170/91 148/78 148/78   Pulse: 106 109 109   Temp:      TempSrc:      Resp: 26 24 28    Height:    5' 2.99" (1.6 m)  Weight:    72 kg (158 lb 11.7 oz)  SpO2: 95% 93% 94%      General:  Well-developed well-nourished.  Eyes: Anicteric no pallor.  ENT: No discharge from ears eyes nose mouth.  Neck: No mass felt.  Cardiovascular: S1-S2 heard.  Respiratory: No rhonchi or crepitations.  Abdomen: Soft nontender bowel sounds present.  Skin: No rash.  Musculoskeletal: Bilateral lower extremity edema.  Psychiatric: Appears normal.  Neurologic: Alert awake oriented to time place and person. Moves all extremities.  Labs on Admission:  Basic Metabolic Panel:  Recent Labs Lab 02/26/13 2025 02/27/13 0110 02/27/13 0630 02/28/13 0500 03/01/13 0305 03/02/13 1929  NA 137  --  140 143 144 142  K 4.7  --  4.3 3.9 4.0 5.2  CL 100  --  103 104 102 99  CO2 21  --  21 22 23 26   GLUCOSE 108*  --  97 107* 106* 120*  BUN 50*  --  52* 62* 71* 66*  CREATININE 3.51* 3.49* 3.56* 3.77* 3.76* 3.90*  CALCIUM 8.5  --  8.3* 8.3* 8.2* 9.1   Liver Function Tests:  Recent Labs Lab 02/26/13 2025  AST 34  ALT 28  ALKPHOS 116  BILITOT <0.2*  PROT 6.3  ALBUMIN 3.2*   No results found for this basename: LIPASE, AMYLASE,  in  the last 168 hours No results found for this basename: AMMONIA,  in the last 168 hours CBC:  Recent Labs Lab 02/26/13 2025 02/27/13 0110 03/02/13 1929  WBC 5.1 4.6 10.7*  NEUTROABS 3.5  --   --   HGB 12.5* 11.9* 13.7  HCT 37.7* 34.9* 40.6  MCV 94.7 93.8 94.0  PLT 169 172 231   Cardiac Enzymes:  Recent Labs Lab 02/26/13 2025 02/27/13 0110 02/27/13 0656 02/27/13 1425 03/02/13 1929  CKTOTAL  --  255* 168 175  --  CKMB  --  11.7* 10.4* 11.2*  --   TROPONINI 0.46* 0.42* 0.44* 0.36* <0.30    BNP (last 3 results)  Recent Labs  02/26/13 2025 02/28/13 1614 03/02/13 1929  PROBNP 4115.0* 3385.0* 2760.0*   CBG: No results found for this basename: GLUCAP,  in the last 168 hours  Radiological Exams on Admission: Dg Chest Port 1 View  03/02/2013   CLINICAL DATA:  Cough and shortness of breath, history of congestive heart failure  EXAM: PORTABLE CHEST - 1 VIEW  COMPARISON:  DG CHEST 2 VIEW dated 02/26/2013; NM BONE 3 PHASE dated 11/08/2012; DG CHEST 2 VIEW dated 08/06/2012  FINDINGS: Mild cardiac enlargement stable. Vascular pattern normal. Left lung is clear. 13 mm nodular opacity lateral right mid lung zone, stable in size but appearing more consistent with resolving atelectasis, with central aeration currently which was not present previously. Bilateral shoulder arthritis.  IMPRESSION: Improved aeration right base. Mild improvement in the appearance of pulmonary nodule lateral right mid lung zone. As recommended in report of the 02/26/2013 study, CT thorax would be recommended to evaluate this nodular opacity. Alternatively, given the partial interval improvement, consider chest radiograph in 1-2 weeks to re-evaluate.   Electronically Signed   By: Skipper Cliche M.D.   On: 03/02/2013 20:24    EKG: Independently reviewed. Normal sinus rhythm with RBBB.  Assessment/Plan Principal Problem:   Shortness of breath Active Problems:   Chronic renal insufficiency   CAD (coronary artery  disease)- MI in '09   Acute on chronic combined systolic and diastolic congestive heart failure, NYHA class 4   Cardiomyopathy, ischemic- EF 45% 2D 02/27/13   SOB (shortness of breath)   1. Acute respiratory failure most likely secondary to decompensated CHF - last EF was around 45%. Patient has been placed on Lasix 20 mg IV every 12. Closely follow intake output and metabolic panel. Since there is concern for possible PE VQ scan has been ordered and patient has been placed on heparin IV infusion. Cycle cardiac markers. Patient not a candidate for ACE inhibitors due to renal failure. 2. Recent admission for non-ST elevation MI with history of CAD- cycle cardiac markers. Continue antiplatelet agents. 3. Chronic kidney disease with progressive renal failure - closely follow intake output and metabolic panel. May discuss with nephrologist in a.m. 4. Hypertension - continue present medications. 5. Gout - continue present medications. 6. Lung nodule - will need further workup as outpatient. 7. Mild anemia - probably from chronic kidney disease. Closely follow CBC.  Have reviewed patient's charts and labs. Follow influenza PCR.  Code Status: Full code.  Family Communication: Patient's daughter at the bedside.  Disposition Plan: Admit to inpatient.    Jule Schlabach N. [facility] Pager [phone].  If 7PM-7AM, please contact night-coverage [email or web address] Password [email or web address] 03/03/2013, 2:26 AM

## 2013-03-03 NOTE — Progress Notes (Signed)
ANTICOAGULATION CONSULT NOTE - Initial Consult  Pharmacy Consult for Heparin Indication: possible PE  Allergies  Allergen Reactions  . Crestor [Rosuvastatin Calcium]     MUSCLE PAIN  . Gentamycin [Gentamicin]     Decreased kidney fx  . Hctz [Hydrochlorothiazide]     dizziness  . Lipitor [Atorvastatin Calcium]     MUSCLE PAIN    Patient Measurements: Height: 5' 2.99" (160 cm) Weight: 158 lb 11.7 oz (72 kg) IBW/kg (Calculated) : 56.88  Vital Signs: Temp: 98.3 F (36.8 C) (02/11 1927) Temp src: Oral (02/11 1927) BP: 148/78 mmHg (02/11 2332) Pulse Rate: 109 (02/11 2332)  Labs:  Recent Labs  02/28/13 0500 03/01/13 0305 03/02/13 1929  HGB  --   --  13.7  HCT  --   --  40.6  PLT  --   --  231  CREATININE 3.77* 3.76* 3.90*  TROPONINI  --   --  <0.30    Estimated Creatinine Clearance: 13 ml/min (by C-G formula based on Cr of 3.9).   Medical History: Past Medical History  Diagnosis Date  . Hypertension   . Gout   . IHD (ischemic heart disease)     Remote MI in 2009 with late presentation; no reperfusion. Has large area of infarct in the apical anterior area on last nuclear in 2011. Managed medically  . Chronic renal insufficiency     creatinine ranges around 2.8  . OA (osteoarthritis) of knee     Left knee with revision and replacement of prosthetic L TKR  . 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 DOE (dyspnea on exertion)   . Herpes zoster 12/2009  . Peripheral edema   . RBBB (right bundle branch block)   . History of BPH   . Cancer   . Renal insufficiency   . Asthma     Medications: APAP  Albuterol  Allopurinol  ASA  Plavix  Lasix  Hydralazine  Imdur  Lopressor  Ntg  Valtrax    Assessment: 80 yo male with SOB, possible PE, for heparin Goal of Therapy:  Heparin level 0.3-0.7 units/ml Monitor platelets by anticoagulation protocol: Yes   Plan:  Heparin 3000 units IV bolus, then 1100 units/hr Check heparin level in 8 hours.  Caryl Pina 03/03/2013,1:00 AM

## 2013-03-03 NOTE — Progress Notes (Signed)
Pt on Heparin drip for ? PE. VQ scan done today states "low probability for PE". This NP reviewed attending's note which says stop Heparin if PE excluded. Therefore, Heparin drip stopped. Heparin sq injections to start in am for DVT prophylaxis.  Clance Boll, NP [facility]

## 2013-03-03 NOTE — Progress Notes (Signed)
Utilization Review Completed.  

## 2013-03-03 NOTE — Progress Notes (Addendum)
ANTIBIOTIC CONSULT NOTE - INITIAL ANTICOAGULATION FOLLOW-UP NOTE  Pharmacy Consult for Vancomycin and Cefepime, Heparin Indication: rule out pneumonia, possible PE  Allergies  Allergen Reactions  . Crestor [Rosuvastatin Calcium]     MUSCLE PAIN  . Gentamycin [Gentamicin]     Decreased kidney fx  . Hctz [Hydrochlorothiazide]     dizziness  . Lipitor [Atorvastatin Calcium]     MUSCLE PAIN    Patient Measurements: Height: 5\' 3"  (160 cm) Weight: 157 lb 10.1 oz (71.5 kg) IBW/kg (Calculated) : 56.9  Vital Signs: Temp: 99.3 F (37.4 C) (02/12 0734) Temp src: Oral (02/12 0734) BP: 155/69 mmHg (02/12 0734) Pulse Rate: 81 (02/12 0734) Intake/Output from previous day: 02/11 0701 - 02/12 0700 In: 211 [P.O.:150; I.V.:61] Out: 900 [Urine:900] Intake/Output from this shift:    Labs:  Recent Labs  03/01/13 0305 03/02/13 1929 03/03/13 0540  WBC  --  10.7* 18.9*  HGB  --  13.7 12.4*  PLT  --  231 225  CREATININE 3.76* 3.90* 3.90*   Estimated Creatinine Clearance: 13 ml/min (by C-G formula based on Cr of 3.9).  Microbiology: Recent Results (from the past 720 hour(s))  MRSA PCR SCREENING     Status: None   Collection Time    02/27/13  7:23 AM      Result Value Ref Range Status   MRSA by PCR NEGATIVE  NEGATIVE Final   Comment:            The GeneXpert MRSA Assay (FDA     approved for NASAL specimens     only), is one component of a     comprehensive MRSA colonization     surveillance program. It is not     intended to diagnose MRSA     infection nor to guide or     monitor treatment for     MRSA infections.    Medical History: Past Medical History  Diagnosis Date  . Hypertension   . Gout   . IHD (ischemic heart disease)     Remote MI in 2009 with late presentation; no reperfusion. Has large area of infarct in the apical anterior area on last nuclear in 2011. Managed medically  . Chronic renal insufficiency     creatinine ranges around 2.8  . OA  (osteoarthritis) of knee     Left knee with revision and replacement of prosthetic L TKR  . 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 DOE (dyspnea on exertion)   . Herpes zoster 12/2009  . Peripheral edema   . RBBB (right bundle branch block)   . History of BPH   . Cancer   . Renal insufficiency   . Asthma    Assessment: 84yom discharged from the hospital yesterday evening after admission for CHF returned to the ED last night with SOB and productive cough. PE being ruled out. He will begin empiric antibiotics for possible HCAP. Creatinine is elevated at 3.9 (baseline ~2.8) with CrCl 65ml/min.  Goal of Therapy:  Vancomycin trough level 15-20 mcg/ml  Plan:  1) Vancomycin 1g IV q48 2) Cefepime 1g IV q24 3) Follow renal function, cultures, LOT, level at steady state  Deboraha Sprang 03/03/2013,8:45 AM  Addendum: Patient continues on heparin for possible PE. Initial heparin level is therapeutic. VQ scan pending.  Plan: 1) Continue heparin at 1100 units/hr 2) Check heparin level in 8 hours to confirm 3) Follow up VQ scan  Deboraha Sprang 03/03/2013, 10:40 AM

## 2013-03-04 LAB — CBC
HEMATOCRIT: 34 % — AB (ref 39.0–52.0)
HEMOGLOBIN: 11.7 g/dL — AB (ref 13.0–17.0)
MCH: 32.1 pg (ref 26.0–34.0)
MCHC: 34.4 g/dL (ref 30.0–36.0)
MCV: 93.2 fL (ref 78.0–100.0)
Platelets: 205 10*3/uL (ref 150–400)
RBC: 3.65 MIL/uL — ABNORMAL LOW (ref 4.22–5.81)
RDW: 13.6 % (ref 11.5–15.5)
WBC: 12.7 10*3/uL — ABNORMAL HIGH (ref 4.0–10.5)

## 2013-03-04 LAB — BASIC METABOLIC PANEL
BUN: 79 mg/dL — AB (ref 6–23)
CALCIUM: 8.9 mg/dL (ref 8.4–10.5)
CO2: 22 mEq/L (ref 19–32)
CREATININE: 3.93 mg/dL — AB (ref 0.50–1.35)
Chloride: 97 mEq/L (ref 96–112)
GFR calc Af Amer: 15 mL/min — ABNORMAL LOW (ref >90)
GFR calc non Af Amer: 13 mL/min — ABNORMAL LOW (ref >90)
GLUCOSE: 122 mg/dL — AB (ref 70–99)
Potassium: 4.8 mEq/L (ref 3.7–5.3)
Sodium: 136 mEq/L — ABNORMAL LOW (ref 137–147)

## 2013-03-04 LAB — LEGIONELLA ANTIGEN, URINE: Legionella Antigen, Urine: NEGATIVE

## 2013-03-04 NOTE — ED Provider Notes (Signed)
I saw and evaluated the patient, reviewed the resident's note and I agree with the findings and plan. If applicable, I agree with the resident's interpretation of the EKG.  If applicable, I was present for critical portions of any procedures performed.  SOB, cough, hypoxia. Discharged yesterday for CHF and NSTEMI.  Denies chest pain.   +2 pedal edema to mid shin, scattered rhonchi. Cr at baseline.  CHF v possible PE v HCAP  Ezequiel Essex, MD 03/04/13 [phone]

## 2013-03-04 NOTE — Progress Notes (Signed)
[facility] ICU Progress Note  [name] PIR:[id number] DOB: 12-Jun-1928 DOA: 03/02/2013 PCP: Delphina Cahill, MD  Brief narrative: 80 year old male patient with known chronic systolic heart failure, chronic knee disease and hypertension as well as CAD. Discharge 2 days ago after treatment for hypertensive crisis with associated systolic heart failure and type II non-STEMI. Returns to the ER because of progressive shortness of breath since discharge as well as a nonproductive cough. No apparent fevers or chills endorse that at time of admission. Upon arrival to the ER he was found be acutely short of breath in the emergency room physician felt this may be related to heart failure CT was given Lasix 20 mg IV x1 dose. The ER physician also discussed the case with the on-call cardiologist. Once again his cardiac markers were mildly positive. Concerns were also for possible PE so he was started on empiric IV heparin and a VQ scan has been ordered. The patient did not have significant leukocytosis at presentation his white count was only 10,700. His BUN and creatinine were at his baseline readings. Her dialysis was negative. Patient eventually developed low-grade fever of 99.4 while in the emergency department.  Assessment/Plan: Active Problems:   Acute respiratory failure with hypoxia:  a) ? HCAP (healthcare-associated pneumonia)  b)  Influenza A - primary respiratory issues seem to be related to influenza but since recently hospitalized could have secondary bacterial pneumonia so will continue empiric antibiotic -Check Procalcitonin to help clarify -cont Tamiflu and add Flutter valve -cont supportive care including oxygen and pulmonary toileting -VQ negative for PE so IV Heparin was dc'd -blood cultures NGTD    CKD (chronic kidney disease), stage IV -At baseline    Acute on chronic combined systolic and diastolic congestive heart failure, NYHA class 4 -Compensated -Due to  hypoxemia and had a tiny degree of decompensation at presentation -Lasix on hold due to suspected dehydration related to influenza    Cardiomyopathy, ischemic- EF 45% 2D 02/27/13 -If requires IV fluids would do so gently with low flow rate -Continue Apresoline, Imdur and Lopressor -BP up- hopefully can resume Lasix in am-PO intake remains poor    CAD (coronary artery disease)- MI in '09 -No symptoms -Continue home medication including Plavix   DVT prophylaxis: IV heparin until PE excluded Code Status: Full Family Communication: No family at bedside Disposition Plan/Expected LOS: Transfer to floor   Consultants: None  Procedures: None  Antibiotics: Maxipime 2/12 >>> Vancomycin 2/12 >>> Tamiflu 2/12 >>>  HPI/Subjective: Malaise improved but still with anorexia. No SOB but endorses wet cough  Objective: Blood pressure 148/66, pulse 61, temperature 98.5 F (36.9 C), temperature source Oral, resp. rate 22, height 5\' 3"  (1.6 m), weight 161 lb 6 oz (73.2 kg), SpO2 96.00%.  Intake/Output Summary (Last 24 hours) at 03/04/13 1231 Last data filed at 03/04/13 1200  Gross per 24 hour  Intake  269.5 ml  Output   1200 ml  Net -930.5 ml     Exam: General: No acute respiratory distress Lungs: Coarse to auscultation bilaterally without wheezes or crackles, RA Cardiovascular: Regular rate and rhythm without murmur gallop or rub normal S1 and S2, trace to 1+ peripheral edema without JVD-pt has been sitting with legs dependent Abdomen: Nontender, nondistended, soft, bowel sounds positive, no rebound, no ascites, no appreciable mass Musculoskeletal: No significant cyanosis, clubbing of bilateral lower extremities Neurological: Alert and oriented x 3, moves all extremities x 4 without focal neurological deficits, CN 2-12 intact  Scheduled Meds:  Scheduled Meds: . allopurinol  50 mg Oral Daily  . aspirin  81 mg Oral QODAY  . ceFEPime (MAXIPIME) IV  1 g Intravenous Q24H  . clopidogrel   75 mg Oral Daily  . heparin subcutaneous  5,000 Units Subcutaneous 3 times per day  . hydrALAZINE  75 mg Oral TID  . isosorbide mononitrate  30 mg Oral Daily  . metoprolol tartrate  25 mg Oral BID  . oseltamivir  30 mg Oral Daily  . sodium chloride  3 mL Intravenous Q12H  . sodium chloride  3 mL Intravenous Q12H  . vancomycin  1,000 mg Intravenous Q48H   Continuous Infusions:    Data Reviewed: Basic Metabolic Panel:  Recent Labs Lab 02/28/13 0500 03/01/13 0305 03/02/13 1929 03/03/13 0540 03/04/13 0334  NA 143 144 142 138 136*  K 3.9 4.0 5.2 5.2 4.8  CL 104 102 99 99 97  CO2 22 23 26 25 22   GLUCOSE 107* 106* 120* 151* 122*  BUN 62* 71* 66* 69* 79*  CREATININE 3.77* 3.76* 3.90* 3.90* 3.93*  CALCIUM 8.3* 8.2* 9.1 8.9 8.9   Liver Function Tests:  Recent Labs Lab 02/26/13 2025 03/03/13 0540  AST 34 27  ALT 28 29  ALKPHOS 116 109  BILITOT <0.2* 0.4  PROT 6.3 6.5  ALBUMIN 3.2* 3.3*   No results found for this basename: LIPASE, AMYLASE,  in the last 168 hours No results found for this basename: AMMONIA,  in the last 168 hours CBC:  Recent Labs Lab 02/26/13 2025 02/27/13 0110 03/02/13 1929 03/03/13 0540 03/04/13 0334  WBC 5.1 4.6 10.7* 18.9* 12.7*  NEUTROABS 3.5  --   --  16.3*  --   HGB 12.5* 11.9* 13.7 12.4* 11.7*  HCT 37.7* 34.9* 40.6 36.5* 34.0*  MCV 94.7 93.8 94.0 93.1 93.2  PLT 169 172 231 225 205   Cardiac Enzymes:  Recent Labs Lab 02/27/13 0110 02/27/13 0656 02/27/13 1425 03/02/13 1929 03/03/13 0540 03/03/13 0900 03/03/13 1415  CKTOTAL 255* 168 175  --   --   --   --   CKMB 11.7* 10.4* 11.2*  --   --   --   --   TROPONINI 0.42* 0.44* 0.36* <0.30 <0.30 <0.30 <0.30   BNP (last 3 results)  Recent Labs  02/26/13 2025 02/28/13 1614 03/02/13 1929  PROBNP 4115.0* 3385.0* 2760.0*   CBG: No results found for this basename: GLUCAP,  in the last 168 hours  Recent Results (from the past 240 hour(s))  MRSA PCR SCREENING     Status: None    Collection Time    02/27/13  7:23 AM      Result Value Ref Range Status   MRSA by PCR NEGATIVE  NEGATIVE Final   Comment:            The GeneXpert MRSA Assay (FDA     approved for NASAL specimens     only), is one component of a     comprehensive MRSA colonization     surveillance program. It is not     intended to diagnose MRSA     infection nor to guide or     monitor treatment for     MRSA infections.  CULTURE, BLOOD (ROUTINE X 2)     Status: None   Collection Time    03/03/13  9:55 AM      Result Value Ref Range Status   Specimen Description BLOOD RIGHT ARM   Final  Special Requests BOTTLES DRAWN AEROBIC ONLY [facility]   Final   Culture  Setup Time     Final   Value: 03/03/2013 14:36     Performed at [facility]   Culture     Final   Value:        BLOOD CULTURE RECEIVED NO GROWTH TO DATE CULTURE WILL BE HELD FOR 5 DAYS BEFORE ISSUING A FINAL NEGATIVE REPORT     Performed at [facility]   Report Status PENDING   Incomplete  CULTURE, BLOOD (ROUTINE X 2)     Status: None   Collection Time    03/03/13 10:05 AM      Result Value Ref Range Status   Specimen Description BLOOD RIGHT FOREARM   Final   Special Requests BOTTLES DRAWN AEROBIC ONLY [facility]   Final   Culture  Setup Time     Final   Value: 03/03/2013 14:36     Performed at [facility]   Culture     Final   Value:        BLOOD CULTURE RECEIVED NO GROWTH TO DATE CULTURE WILL BE HELD FOR 5 DAYS BEFORE ISSUING A FINAL NEGATIVE REPORT     Performed at [facility]   Report Status PENDING   Incomplete     Studies:  Recent x-ray studies have been reviewed in detail by the Attending Physician  Time spent :     Erin Hearing, ANP [facility] Office  [phone] Pager [phone]  **If unable to reach the above provider after paging please contact the [place] @ [phone]  On-Call/Text Page:      [email or web address]      password [email or web address]  If 7PM-7AM, please contact  night-coverage [email or web address] Password [facility] 03/04/2013, 12:31 PM   LOS: 2 days   I have examined the patient, reviewed the chart and modified the above note which I agree with.   Zuhayr Deeney,MD [id number] 03/04/2013, 3:58 PM

## 2013-03-04 NOTE — Progress Notes (Signed)
04/01/2013 Patient transfer from [place] to [facility] at 1300. He is alert, oriented and up with assist. Patient have a little weakness and was placed on bed alarm. He have a scab on the left shin, a small red area on the right shin and bruise on bilateral arms. Patient was placed on droplet, when arrived on unit. [facility] RN.

## 2013-03-05 DIAGNOSIS — I214 Non-ST elevation (NSTEMI) myocardial infarction: Secondary | ICD-10-CM

## 2013-03-05 LAB — CBC
HEMATOCRIT: 32.2 % — AB (ref 39.0–52.0)
HEMOGLOBIN: 11 g/dL — AB (ref 13.0–17.0)
MCH: 32.2 pg (ref 26.0–34.0)
MCHC: 34.2 g/dL (ref 30.0–36.0)
MCV: 94.2 fL (ref 78.0–100.0)
Platelets: 234 10*3/uL (ref 150–400)
RBC: 3.42 MIL/uL — AB (ref 4.22–5.81)
RDW: 13.7 % (ref 11.5–15.5)
WBC: 9 10*3/uL (ref 4.0–10.5)

## 2013-03-05 LAB — BASIC METABOLIC PANEL
BUN: 34 mg/dL — AB (ref 6–23)
CHLORIDE: 103 meq/L (ref 96–112)
CO2: 25 mEq/L (ref 19–32)
Calcium: 7.7 mg/dL — ABNORMAL LOW (ref 8.4–10.5)
Creatinine, Ser: 1.72 mg/dL — ABNORMAL HIGH (ref 0.50–1.35)
GFR calc non Af Amer: 35 mL/min — ABNORMAL LOW (ref >90)
GFR, EST AFRICAN AMERICAN: 41 mL/min — AB (ref >90)
Glucose, Bld: 110 mg/dL — ABNORMAL HIGH (ref 70–99)
POTASSIUM: 4 meq/L (ref 3.7–5.3)
Sodium: 143 mEq/L (ref 137–147)

## 2013-03-05 LAB — PROCALCITONIN: Procalcitonin: <0.1 ng/mL

## 2013-03-05 MED ORDER — FUROSEMIDE 40 MG PO TABS
40.0000 mg | ORAL_TABLET | Freq: Every day | ORAL | Status: DC
  Administered 2013-03-05: 40 mg via ORAL
  Filled 2013-03-05 (×2): qty 1

## 2013-03-05 NOTE — Evaluation (Signed)
Physical Therapy Evaluation Patient Details Name: [name] MRN: [id number] DOB: 03/01/1928 Today's Date: 03/05/2013 Time: 1414-1430 PT Time Calculation (min): 16 min  PT Assessment / Plan / Recommendation History of Present Illness  80 year old male patient with known chronic systolic heart failure, chronic knee disease and hypertension as well as CAD. Discharge 2 days ago after treatment for hypertensive crisis with associated systolic heart failure and type II non-STEMI. Returns to the ER because of progressive shortness of breath since discharge as well as a nonproductive cough.  Clinical Impression  Patient presents with decreased independence with mobility due to deficits listed below.  He will benefit from skilled PT in the acute setting to allow return home with HHPT.  Needs further documentation of O2 sats ambulating on room air as he was quite dyspneic ambulating in hallway on room air.    PT Assessment  Patient needs continued PT services    Follow Up Recommendations  Home health PT    Does the patient have the potential to tolerate intense rehabilitation    N/A  Barriers to Discharge  None      Equipment Recommendations  None recommended by PT    Recommendations for Other Services   None  Frequency Min 3X/week    Precautions / Restrictions Precautions Precautions: Fall Precaution Comments: uses crutch and shoe lift at home   Pertinent Vitals/Pain No pain complaints HR 77 SaO2 91% after minute rest ambulating on room air RR 32      Mobility  Bed Mobility General bed mobility comments: NT sitting edge of bed upon my entry Transfers Overall transfer level: Modified independent Equipment used: Crutches (one crutch) Ambulation/Gait Ambulation/Gait assistance: Min guard Ambulation Distance (Feet): 170 Feet Assistive device: Crutches (single crutch) Gait Pattern/deviations: Step-to pattern;Antalgic General Gait Details: dyspneic with ambulation on room air,  checked SaO2 but was about a minute or less after seated in room was 91%.      Exercises     PT Diagnosis: Difficulty walking;Generalized weakness  PT Problem List: Decreased activity tolerance;Cardiopulmonary status limiting activity;Decreased mobility PT Treatment Interventions: DME instruction;Patient/family education;Functional mobility training;Gait training;Stair training;Therapeutic activities     PT Goals(Current goals can be found in the care plan section) Acute Rehab PT Goals Patient Stated Goal: to get well prior to going home PT Goal Formulation: With patient Time For Goal Achievement: 03/19/13 Potential to Achieve Goals: Good  Visit Information  Last PT Received On: 03/05/13 Assistance Needed: +1 History of Present Illness: 80 year old male patient with known chronic systolic heart failure, chronic knee disease and hypertension as well as CAD. Discharge 2 days ago after treatment for hypertensive crisis with associated systolic heart failure and type II non-STEMI. Returns to the ER because of progressive shortness of breath since discharge as well as a nonproductive cough.       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 Communication Communication: No difficulties    Cognition  Cognition Arousal/Alertness: Awake/alert Behavior During Therapy: WFL for  tasks assessed/performed Overall Cognitive Status:  Within Functional Limits for tasks assessed    Extremity/Trunk Assessment Lower Extremity Assessment Lower Extremity Assessment: RLE deficits/detail RLE Deficits / Details: RLE decreased leg length with limited hip flexion from previous THAs; strength WFL except hip flexion 2+/5 Cervical / Trunk Assessment Cervical / Trunk Assessment: Kyphotic   Balance Balance Overall balance assessment: History of Falls;Needs assistance Standing balance-Leahy Scale: Fair Standing balance comment: uses crutch for ambulation, static stance without UE support  End of Session PT - End of Session Equipment Utilized During Treatment: Gait belt Activity Tolerance: Patient limited by fatigue Patient left: in bed;with call bell/phone within reach Nurse Communication: Mobility status  GP     [facility] 03/05/2013, 2:54 PM [place], [place] 03/05/2013

## 2013-03-05 NOTE — Progress Notes (Signed)
Progress Note  [name] FYB:[id number] DOB: 1928-12-09 DOA: 03/02/2013 PCP: Delphina Cahill, MD  Brief narrative: 80 year old male patient with known chronic systolic heart failure, chronic knee disease and hypertension as well as CAD. Discharge 2 days ago after treatment for hypertensive crisis with associated systolic heart failure and type II non-STEMI. Returns to the ER because of progressive shortness of breath since discharge as well as a nonproductive cough. No apparent fevers or chills endorse that at time of admission. Upon arrival to the ER he was found be acutely short of breath in the emergency room physician felt this may be related to heart failure CT was given Lasix 20 mg IV x1 dose. The ER physician also discussed the case with the on-call cardiologist. Once again his cardiac markers were mildly positive. Concerns were also for possible PE so he was started on empiric IV heparin and a VQ scan has been ordered. The patient did not have significant leukocytosis at presentation his white count was only 10,700. His BUN and creatinine were at his baseline readings. Her dialysis was negative. Patient eventually developed low-grade fever of 99.4 while in the emergency department.  Assessment/Plan:    Acute respiratory failure with hypoxia:  a) ? HCAP (healthcare-associated pneumonia)  b)  Influenza A - primary respiratory issues seem to be related to influenza but since recently hospitalized could have secondary bacterial pneumonia so will continue empiric antibiotic -pro calcitonin low -cont Tamiflu and add Flutter valve -cont supportive care including oxygen and pulmonary toileting -VQ negative for PE so IV Heparin was dc'd -blood cultures NGTD    CKD (chronic kidney disease), stage IV -At baseline    Acute on chronic combined systolic and diastolic congestive heart failure, NYHA class 4 -Compensated -Due to hypoxemia and had a tiny degree of decompensation at  presentation -resume low dose lasix    Cardiomyopathy, ischemic- EF 45% 2D 02/27/13 -If requires IV fluids would do so gently with low flow rate -Continue Apresoline, Imdur and Lopressor -resume lasix    CAD (coronary artery disease)- MI in '09 -No symptoms -Continue home medication including Plavix   DVT prophylaxis: SQ hep Code Status: Full Family Communication: No family at bedside Disposition Plan/Expected LOS: PT eval   Consultants: None  Procedures: None  Antibiotics: Maxipime 2/12 >>>2/14 Vancomycin 2/12 >>>2/14 Tamiflu 2/12 >>>  HPI/Subjective: Feeling better but not back to baseline yet  Objective: Blood pressure 125/68, pulse 62, temperature 98.1 F (36.7 C), temperature source Oral, resp. rate 24, height 5\' 3"  (1.6 m), weight 73.2 kg (161 lb 6 oz), SpO2 92.00%.  Intake/Output Summary (Last 24 hours) at 03/05/13 0851 Last data filed at 03/05/13 0421  Gross per 24 hour  Intake    723 ml  Output   1775 ml  Net  -1052 ml     Exam: General: No acute respiratory distress Lungs: Coarse to auscultation bilaterally without wheezes or crackles, RA Cardiovascular: Regular rate and rhythm without murmur gallop or rub normal S1 and S2, trace to 1+ peripheral edema without JVD-pt has been sitting with legs dependent Abdomen: Nontender, nondistended, soft, bowel sounds positive, no rebound, no ascites, no appreciable mass Musculoskeletal: No significant cyanosis, clubbing of bilateral lower extremities Neurological: Alert and oriented x 3, moves all extremities x 4 without focal neurological deficits, CN 2-12 intact  Scheduled Meds:  Scheduled Meds: . allopurinol  50 mg Oral Daily  . aspirin  81 mg Oral QODAY  . ceFEPime (MAXIPIME) IV  1 g  Intravenous Q24H  . clopidogrel  75 mg Oral Daily  . heparin subcutaneous  5,000 Units Subcutaneous 3 times per day  . hydrALAZINE  75 mg Oral TID  . isosorbide mononitrate  30 mg Oral Daily  . metoprolol tartrate  25 mg  Oral BID  . oseltamivir  30 mg Oral Daily  . sodium chloride  3 mL Intravenous Q12H  . sodium chloride  3 mL Intravenous Q12H  . vancomycin  1,000 mg Intravenous Q48H   Continuous Infusions:    Data Reviewed: Basic Metabolic Panel:  Recent Labs Lab 02/28/13 0500 03/01/13 0305 03/02/13 1929 03/03/13 0540 03/04/13 0334  NA 143 144 142 138 136*  K 3.9 4.0 5.2 5.2 4.8  CL 104 102 99 99 97  CO2 22 23 26 25 22   GLUCOSE 107* 106* 120* 151* 122*  BUN 62* 71* 66* 69* 79*  CREATININE 3.77* 3.76* 3.90* 3.90* 3.93*  CALCIUM 8.3* 8.2* 9.1 8.9 8.9   Liver Function Tests:  Recent Labs Lab 02/26/13 2025 03/03/13 0540  AST 34 27  ALT 28 29  ALKPHOS 116 109  BILITOT <0.2* 0.4  PROT 6.3 6.5  ALBUMIN 3.2* 3.3*   No results found for this basename: LIPASE, AMYLASE,  in the last 168 hours No results found for this basename: AMMONIA,  in the last 168 hours CBC:  Recent Labs Lab 02/26/13 2025 02/27/13 0110 03/02/13 1929 03/03/13 0540 03/04/13 0334 03/05/13 0340  WBC 5.1 4.6 10.7* 18.9* 12.7* 9.0  NEUTROABS 3.5  --   --  16.3*  --   --   HGB 12.5* 11.9* 13.7 12.4* 11.7* 11.0*  HCT 37.7* 34.9* 40.6 36.5* 34.0* 32.2*  MCV 94.7 93.8 94.0 93.1 93.2 94.2  PLT 169 172 231 225 205 234   Cardiac Enzymes:  Recent Labs Lab 02/27/13 0110 02/27/13 0656 02/27/13 1425 03/02/13 1929 03/03/13 0540 03/03/13 0900 03/03/13 1415  CKTOTAL 255* 168 175  --   --   --   --   CKMB 11.7* 10.4* 11.2*  --   --   --   --   TROPONINI 0.42* 0.44* 0.36* <0.30 <0.30 <0.30 <0.30   BNP (last 3 results)  Recent Labs  02/26/13 2025 02/28/13 1614 03/02/13 1929  PROBNP 4115.0* 3385.0* 2760.0*   CBG: No results found for this basename: GLUCAP,  in the last 168 hours  Recent Results (from the past 240 hour(s))  MRSA PCR SCREENING     Status: None   Collection Time    02/27/13  7:23 AM      Result Value Ref Range Status   MRSA by PCR NEGATIVE  NEGATIVE Final   Comment:            The  GeneXpert MRSA Assay (FDA     approved for NASAL specimens     only), is one component of a     comprehensive MRSA colonization     surveillance program. It is not     intended to diagnose MRSA     infection nor to guide or     monitor treatment for     MRSA infections.  CULTURE, BLOOD (ROUTINE X 2)     Status: None   Collection Time    03/03/13  9:55 AM      Result Value Ref Range Status   Specimen Description BLOOD RIGHT ARM   Final   Special Requests BOTTLES DRAWN AEROBIC ONLY [facility]   Final   Culture  Setup Time  Final   Value: 03/03/2013 14:36     Performed at [facility]   Culture     Final   Value:        BLOOD CULTURE RECEIVED NO GROWTH TO DATE CULTURE WILL BE HELD FOR 5 DAYS BEFORE ISSUING A FINAL NEGATIVE REPORT     Performed at [facility]   Report Status PENDING   Incomplete  CULTURE, BLOOD (ROUTINE X 2)     Status: None   Collection Time    03/03/13 10:05 AM      Result Value Ref Range Status   Specimen Description BLOOD RIGHT FOREARM   Final   Special Requests BOTTLES DRAWN AEROBIC ONLY [facility]   Final   Culture  Setup Time     Final   Value: 03/03/2013 14:36     Performed at [facility]   Culture     Final   Value:        BLOOD CULTURE RECEIVED NO GROWTH TO DATE CULTURE WILL BE HELD FOR 5 DAYS BEFORE ISSUING A FINAL NEGATIVE REPORT     Performed at [facility]   Report Status PENDING   Incomplete       Time spent : 35 min     Eulogio Bear DO [facility] Office  [phone] Pager [phone]  **If unable to reach the above provider after paging please contact the [place] @ [phone]  On-Call/Text Page:      [email or web address]      password [email or web address]  If 7PM-7AM, please contact night-coverage [email or web address] Password [facility] 03/05/2013, 8:51 AM   LOS: 3 days

## 2013-03-06 DIAGNOSIS — I10 Essential (primary) hypertension: Secondary | ICD-10-CM

## 2013-03-06 LAB — BASIC METABOLIC PANEL
BUN: 87 mg/dL — AB (ref 6–23)
CO2: 22 mEq/L (ref 19–32)
Calcium: 9.2 mg/dL (ref 8.4–10.5)
Chloride: 101 mEq/L (ref 96–112)
Creatinine, Ser: 3.77 mg/dL — ABNORMAL HIGH (ref 0.50–1.35)
GFR calc Af Amer: 16 mL/min — ABNORMAL LOW (ref >90)
GFR calc non Af Amer: 14 mL/min — ABNORMAL LOW (ref >90)
GLUCOSE: 109 mg/dL — AB (ref 70–99)
POTASSIUM: 4.8 meq/L (ref 3.7–5.3)
Sodium: 139 mEq/L (ref 137–147)

## 2013-03-06 LAB — CBC
HEMATOCRIT: 36.2 % — AB (ref 39.0–52.0)
HEMOGLOBIN: 12.2 g/dL — AB (ref 13.0–17.0)
MCH: 31.5 pg (ref 26.0–34.0)
MCHC: 33.7 g/dL (ref 30.0–36.0)
MCV: 93.5 fL (ref 78.0–100.0)
Platelets: 301 10*3/uL (ref 150–400)
RBC: 3.87 MIL/uL — ABNORMAL LOW (ref 4.22–5.81)
RDW: 13.7 % (ref 11.5–15.5)
WBC: 9.8 10*3/uL (ref 4.0–10.5)

## 2013-03-06 MED ORDER — FUROSEMIDE 20 MG PO TABS
20.0000 mg | ORAL_TABLET | Freq: Every day | ORAL | Status: DC
  Administered 2013-03-06 – 2013-03-07 (×2): 20 mg via ORAL
  Filled 2013-03-06 (×2): qty 1

## 2013-03-06 MED ORDER — GUAIFENESIN ER 600 MG PO TB12
600.0000 mg | ORAL_TABLET | Freq: Two times a day (BID) | ORAL | Status: DC
  Administered 2013-03-06 – 2013-03-07 (×2): 600 mg via ORAL
  Filled 2013-03-06 (×3): qty 1

## 2013-03-06 NOTE — Progress Notes (Signed)
Progress Note  [name]:[id number] DOB: January 18, 1929 DOA: 03/02/2013 PCP: Delphina Cahill, MD  Brief narrative: 80 year old male patient with known chronic systolic heart failure, chronic knee disease and hypertension as well as CAD. Discharge 2 days ago after treatment for hypertensive crisis with associated systolic heart failure and type II non-STEMI. Returns to the ER because of progressive shortness of breath since discharge as well as a nonproductive cough. No apparent fevers or chills endorse that at time of admission. Upon arrival to the ER he was found be acutely short of breath in the emergency room physician felt this may be related to heart failure CT was given Lasix 20 mg IV x1 dose. The ER physician also discussed the case with the on-call cardiologist. Once again his cardiac markers were mildly positive. Concerns were also for possible PE so he was started on empiric IV heparin and a VQ scan has been ordered. The patient did not have significant leukocytosis at presentation his white count was only 10,700. His BUN and creatinine were at his baseline readings. Her dialysis was negative. Patient eventually developed low-grade fever of 99.4 while in the emergency department.  Assessment/Plan:    Acute respiratory failure with hypoxia:  a) ? HCAP (healthcare-associated pneumonia)  b)  Influenza A - primary respiratory issues seem to be related to influenza but since recently hospitalized could have secondary bacterial pneumonia so will continue empiric antibiotic -pro calcitonin low -cont Tamiflu and add Flutter valve -cont supportive care including oxygen and pulmonary toileting -VQ negative for PE so IV Heparin was dc'd -blood cultures NGTD    CKD (chronic kidney disease), stage IV -patient not sure of baseline but from previous hospitalizations appears to be stable (3)    Acute on chronic combined systolic and diastolic congestive heart failure, NYHA class  4 -Compensated -Due to hypoxemia and had a tiny degree of decompensation at presentation -resume low dose lasix    Cardiomyopathy, ischemic- EF 45% 2D 02/27/13 -If requires IV fluids would do so gently with low flow rate -Continue Apresoline, Imdur and Lopressor -resume lasix    CAD (coronary artery disease)- MI in '09 -No symptoms -Continue home medication including Plavix   DVT prophylaxis: SQ hep Code Status: Full Family Communication: No family at bedside Disposition Plan/Expected LOS: PT eval- home health- if Cr ok, plan to d/c in AM   Consultants: None  Procedures: None  Antibiotics: Maxipime 2/12 >>>2/14 Vancomycin 2/12 >>>2/14 Tamiflu 2/12 >>>  HPI/Subjective: Feeling better- answered phone in middle of exam and would not get off  Objective: Blood pressure 155/78, pulse 67, temperature 97.6 F (36.4 C), temperature source Oral, resp. rate 20, height 5\' 3"  (1.6 m), weight 73.2 kg (161 lb 6 oz), SpO2 94.00%.  Intake/Output Summary (Last 24 hours) at 03/06/13 3532 Last data filed at 03/06/13 0100  Gross per 24 hour  Intake    600 ml  Output   1227 ml  Net   -627 ml     Exam: General: No acute respiratory distress Lungs: decreased b/l Cardiovascular: Regular rate and rhythm without murmur gallop or rub normal S1 and S2, trace to 1+ peripheral edema without JVD-pt has been sitting with legs dependent Abdomen: Nontender, nondistended, soft, bowel sounds positive, no rebound, no ascites, no appreciable mass Musculoskeletal: No significant cyanosis, clubbing of bilateral lower extremities Neurological: Alert and oriented x 3, moves all extremities x 4 without focal neurological deficits, CN 2-12 intact  Scheduled Meds:  Scheduled Meds: . allopurinol  50 mg Oral Daily  . aspirin  81 mg Oral QODAY  . clopidogrel  75 mg Oral Daily  . furosemide  20 mg Oral Daily  . heparin subcutaneous  5,000 Units Subcutaneous 3 times per day  . hydrALAZINE  75 mg Oral TID   . isosorbide mononitrate  30 mg Oral Daily  . metoprolol tartrate  25 mg Oral BID  . oseltamivir  30 mg Oral Daily  . sodium chloride  3 mL Intravenous Q12H  . sodium chloride  3 mL Intravenous Q12H   Continuous Infusions:    Data Reviewed: Basic Metabolic Panel:  Recent Labs Lab 03/02/13 1929 03/03/13 0540 03/04/13 0334 03/05/13 0335 03/06/13 0535  NA 142 138 136* 143 139  K 5.2 5.2 4.8 4.0 4.8  CL 99 99 97 103 101  CO2 26 25 22 25 22   GLUCOSE 120* 151* 122* 110* 109*  BUN 66* 69* 79* 34* 87*  CREATININE 3.90* 3.90* 3.93* 1.72* 3.77*  CALCIUM 9.1 8.9 8.9 7.7* 9.2   Liver Function Tests:  Recent Labs Lab 03/03/13 0540  AST 27  ALT 29  ALKPHOS 109  BILITOT 0.4  PROT 6.5  ALBUMIN 3.3*   No results found for this basename: LIPASE, AMYLASE,  in the last 168 hours No results found for this basename: AMMONIA,  in the last 168 hours CBC:  Recent Labs Lab 03/02/13 1929 03/03/13 0540 03/04/13 0334 03/05/13 0340 03/06/13 0535  WBC 10.7* 18.9* 12.7* 9.0 9.8  NEUTROABS  --  16.3*  --   --   --   HGB 13.7 12.4* 11.7* 11.0* 12.2*  HCT 40.6 36.5* 34.0* 32.2* 36.2*  MCV 94.0 93.1 93.2 94.2 93.5  PLT 231 225 205 234 301   Cardiac Enzymes:  Recent Labs Lab 02/27/13 1425 03/02/13 1929 03/03/13 0540 03/03/13 0900 03/03/13 1415  CKTOTAL 175  --   --   --   --   CKMB 11.2*  --   --   --   --   TROPONINI 0.36* <0.30 <0.30 <0.30 <0.30   BNP (last 3 results)  Recent Labs  02/26/13 2025 02/28/13 1614 03/02/13 1929  PROBNP 4115.0* 3385.0* 2760.0*   CBG: No results found for this basename: GLUCAP,  in the last 168 hours  Recent Results (from the past 240 hour(s))  MRSA PCR SCREENING     Status: None   Collection Time    02/27/13  7:23 AM      Result Value Ref Range Status   MRSA by PCR NEGATIVE  NEGATIVE Final   Comment:            The GeneXpert MRSA Assay (FDA     approved for NASAL specimens     only), is one component of a     comprehensive MRSA  colonization     surveillance program. It is not     intended to diagnose MRSA     infection nor to guide or     monitor treatment for     MRSA infections.  CULTURE, BLOOD (ROUTINE X 2)     Status: None   Collection Time    03/03/13  9:55 AM      Result Value Ref Range Status   Specimen Description BLOOD RIGHT ARM   Final   Special Requests BOTTLES DRAWN AEROBIC ONLY [facility]   Final   Culture  Setup Time     Final   Value: 03/03/2013 14:36     Performed at [facility]   Culture     Final   Value:        BLOOD CULTURE RECEIVED NO GROWTH TO DATE CULTURE WILL BE HELD FOR 5 DAYS BEFORE ISSUING A FINAL NEGATIVE REPORT     Performed at [facility]   Report Status PENDING   Incomplete  CULTURE, BLOOD (ROUTINE X 2)     Status: None   Collection Time    03/03/13 10:05 AM      Result Value Ref Range Status   Specimen Description BLOOD RIGHT FOREARM   Final   Special Requests BOTTLES DRAWN AEROBIC ONLY [facility]   Final   Culture  Setup Time     Final   Value: 03/03/2013 14:36     Performed at [facility]   Culture     Final   Value:        BLOOD CULTURE RECEIVED NO GROWTH TO DATE CULTURE WILL BE HELD FOR 5 DAYS BEFORE ISSUING A FINAL NEGATIVE REPORT     Performed at [facility]   Report Status PENDING   Incomplete       Time spent : 35 min     Eulogio Bear DO [facility] Office  [phone] Pager [phone]  **If unable to reach the above provider after paging please contact the [place] @ [phone]  On-Call/Text Page:      [email or web address]      password [email or web address]  If 7PM-7AM, please contact night-coverage [email or web address] Password [facility] 03/06/2013, 8:37 AM   LOS: 4 days

## 2013-03-06 NOTE — Progress Notes (Signed)
03/06/2013 Patient Ambulate in the hallway saturation was 94% on roomair. On his way back to the room saturation was  96 to 97% and patient was a little short of breath. Per patient daughter patient had to catch his breath when he sat down. [facility] RN.

## 2013-03-07 LAB — CBC
HEMATOCRIT: 37 % — AB (ref 39.0–52.0)
HEMOGLOBIN: 12.5 g/dL — AB (ref 13.0–17.0)
MCH: 31.8 pg (ref 26.0–34.0)
MCHC: 33.8 g/dL (ref 30.0–36.0)
MCV: 94.1 fL (ref 78.0–100.0)
Platelets: 302 10*3/uL (ref 150–400)
RBC: 3.93 MIL/uL — ABNORMAL LOW (ref 4.22–5.81)
RDW: 13.6 % (ref 11.5–15.5)
WBC: 8.9 10*3/uL (ref 4.0–10.5)

## 2013-03-07 LAB — BASIC METABOLIC PANEL
BUN: 84 mg/dL — ABNORMAL HIGH (ref 6–23)
CALCIUM: 9.3 mg/dL (ref 8.4–10.5)
CO2: 21 mEq/L (ref 19–32)
CREATININE: 3.78 mg/dL — AB (ref 0.50–1.35)
Chloride: 100 mEq/L (ref 96–112)
GFR calc Af Amer: 16 mL/min — ABNORMAL LOW (ref >90)
GFR, EST NON AFRICAN AMERICAN: 14 mL/min — AB (ref >90)
GLUCOSE: 102 mg/dL — AB (ref 70–99)
Potassium: 5.1 mEq/L (ref 3.7–5.3)
Sodium: 136 mEq/L — ABNORMAL LOW (ref 137–147)

## 2013-03-07 MED ORDER — GUAIFENESIN ER 600 MG PO TB12: 600.0000 mg | ORAL_TABLET | Freq: Two times a day (BID) | ORAL | Status: DC

## 2013-03-07 MED ORDER — POLYETHYLENE GLYCOL 3350 17 G PO PACK
17.0000 g | PACK | Freq: Every day | ORAL | Status: DC
  Filled 2013-03-07: qty 1

## 2013-03-07 MED ORDER — FUROSEMIDE 20 MG PO TABS: 20.0000 mg | ORAL_TABLET | Freq: Every day | ORAL | Status: DC

## 2013-03-07 MED ORDER — OSELTAMIVIR PHOSPHATE 30 MG PO CAPS: 30.0000 mg | ORAL_CAPSULE | Freq: Every day | ORAL | Status: DC

## 2013-03-07 NOTE — Discharge Summary (Addendum)
Physician Discharge Summary  [name] [id number] DOB: 11/01/1928 DOA: 03/02/2013  PCP: Delphina Cahill, MD  Admit date: 03/02/2013 Discharge date: 03/07/2013  Time spent: 35 minutes  Recommendations for Outpatient Follow-up:  1. Patient refused home health 2. Cbc, bmp 1 week  Discharge Diagnoses:  Active Problems:   CKD (chronic kidney disease), stage IV   CAD (coronary artery disease)- MI in '09   Acute on chronic combined systolic and diastolic congestive heart failure, NYHA class 4   Cardiomyopathy, ischemic- EF 45% 2D 02/27/13   Acute respiratory failure with hypoxia   HCAP (healthcare-associated pneumonia)   Influenza A   Discharge Condition: improved  Diet recommendation: cardiac  Filed Weights   03/03/13 0200 03/04/13 0500 03/06/13 2147  Weight: 71.5 kg (157 lb 10.1 oz) 73.2 kg (161 lb 6 oz) 73.3 kg (161 lb 9.6 oz)    History of present illness:  [name] is a 80 y.o. male history of chronic systolic heart failure, CAD, hypertension, chronic kidney disease who was discharged 2 days ago after being admitted for hypertensive crisis, systolic heart failure and non-ST elevation MI presented the ER because of worsening shortness of breath since discharge. Patient also had some non-productive cough. Denies any fever chills or chest pain. In the ER patient was found to be acutely short of breath was given Lasix 20 mg IV. Patient has been admitted for further workup. ER physician had discussed with on-call cardiologist. Patient otherwise denies any nausea vomiting abdominal pain diarrhea fever chills. Cardiac markers point-of-care troponins mildly positive. Since patient had nonproductive cough influenza PCR has been sent and there was also concern for possible PE for which IV heparin has been empirically started and VQ scan ordered for a.m.    Hospital Course:  Acute respiratory failure with hypoxia:  a) ? HCAP (healthcare-associated pneumonia)  b) Influenza A  -  primary respiratory issues seem to be related to influenza but since recently hospitalized could have secondary bacterial pneumonia so will continue empiric antibiotic  -pro calcitonin low  -cont Tamiflu and add Flutter valve  -cont supportive care including oxygen and pulmonary toileting  -VQ negative for PE so IV Heparin was dc'd  -blood cultures NGTD   CKD (chronic kidney disease), stage IV  -patient not sure of baseline but from previous hospitalizations appears to be stable (3)   Acute on chronic combined systolic and diastolic congestive heart failure, NYHA class 4  -Compensated now -Due to hypoxemia and had a tiny degree of decompensation at presentation  -resume low dose lasix   Cardiomyopathy, ischemic- EF 45% 2D 02/27/13  -If requires IV fluids would do so gently with low flow rate  -Continue Apresoline, Imdur and Lopressor  -resume lasix at lower dose -patient will need to weight self daily  CAD (coronary artery disease)- MI in '09  -No symptoms  -Continue home medication including Plavix   Procedures:  none  Consultations:  none  Discharge Exam: Filed Vitals:   03/07/13 0828  BP: 176/98  Pulse: 75  Temp: 97.8 F (36.6 C)  Resp: 19    General: A+Ox3- hard of hearing Cardiovascular: rrr Respiratory: decreased b/l, no wheezing  Discharge Instructions      Discharge Orders   Future Appointments Provider Department Dept Phone   03/18/2013 8:00 AM Burtis Junes, NP [place] Office [phone]   Future Orders Complete By Expires   (HEART FAILURE PATIENTS) Call MD:  Anytime you have any of the following symptoms: 1) 3  pound weight gain in 24 hours or 5 pounds in 1 week 2) shortness of breath, with or without a dry hacking cough 3) swelling in the hands, feet or stomach 4) if you have to sleep on extra pillows at night in order to breathe.  As directed    Call MD for:  difficulty breathing, headache or visual disturbances  As directed     Diet - low sodium heart healthy  As directed    Discharge instructions  As directed    Comments:     Home health PT   Increase activity slowly  As directed        Medication List    STOP taking these medications       valACYclovir 1000 MG tablet  Commonly known as:  VALTREX      TAKE these medications       acetaminophen 325 MG tablet  Commonly known as:  TYLENOL  Take 2 tablets (650 mg total) by mouth every 4 (four) hours as needed for headache or mild pain.     albuterol 108 (90 BASE) MCG/ACT inhaler  Commonly known as:  PROVENTIL HFA;VENTOLIN HFA  Inhale 2 puffs into the lungs every 6 (six) hours as needed for wheezing or shortness of breath.     allopurinol 100 MG tablet  Commonly known as:  ZYLOPRIM  Take 50 mg by mouth daily. 1/2 tablet daily     aspirin 81 MG tablet  Take 81 mg by mouth every other day.     clopidogrel 75 MG tablet  Commonly known as:  PLAVIX  Take 1 tablet (75 mg total) by mouth daily.     furosemide 20 MG tablet  Commonly known as:  LASIX  Take 1 tablet (20 mg total) by mouth daily.     guaiFENesin 600 MG 12 hr tablet  Commonly known as:  MUCINEX  Take 1 tablet (600 mg total) by mouth 2 (two) times daily.     hydrALAZINE 50 MG tablet  Commonly known as:  APRESOLINE  Take 1.5 tablets (75 mg total) by mouth 3 (three) times daily.     isosorbide mononitrate 30 MG 24 hr tablet  Commonly known as:  IMDUR  Take 1 tablet (30 mg total) by mouth daily.     metoprolol tartrate 25 MG tablet  Commonly known as:  LOPRESSOR  Take 1 tablet (25 mg total) by mouth 2 (two) times daily.     nitroGLYCERIN 0.4 MG SL tablet  Commonly known as:  NITROSTAT  Place 1 tablet (0.4 mg total) under the tongue every 5 (five) minutes as needed.     oseltamivir 30 MG capsule  Commonly known as:  TAMIFLU  Take 1 capsule (30 mg total) by mouth daily.       Allergies  Allergen Reactions  . Crestor [Rosuvastatin Calcium]     MUSCLE PAIN  . Gentamycin  [Gentamicin]     Decreased kidney fx  . Hctz [Hydrochlorothiazide]     dizziness  . Lipitor [Atorvastatin Calcium]     MUSCLE PAIN   Follow-up Information   Follow up with Delphina Cahill, MD In 1 week.   Specialty:  Internal Medicine   Contact information:    [place] [address] [phone]        The results of significant diagnostics from this hospitalization (including imaging, microbiology, ancillary and laboratory) are listed below for reference.    Significant Diagnostic Studies: Dg Chest 2 View  02/26/2013   CLINICAL DATA:  Shortness of breath, cough.  EXAM: CHEST  2 VIEW  COMPARISON:  08/06/2012  FINDINGS: Heart is borderline enlarged. Mild peribronchial thickening. Right basilar atelectasis. No visible effusions.  Nodular density in the right mid lung peripherally measuring approximately 13 mm in diameter. Cannot exclude pulmonary nodule. This could be further evaluated with chest CT.  No acute bony abnormality.  IMPRESSION: Mild bronchitic changes.  Right base atelectasis.  Nodular density peripherally in the right mid lung. This could be further evaluated with chest CT if felt clinically indicated.   Electronically Signed   By: Rolm Baptise M.D.   On: 02/26/2013 21:01   Nm Pulmonary Perf And Vent  03/03/2013   CLINICAL DATA:  Productive cough, short of breath  EXAM: NUCLEAR MEDICINE VENTILATION - PERFUSION LUNG SCAN  TECHNIQUE: Ventilation images were obtained in multiple projections using inhaled aerosol technetium 99 M DTPA. Perfusion images were obtained in multiple projections after intravenous injection of Tc-59m MAA.  COMPARISON:  DG CHEST 1V PORT dated 03/02/2013; NM BONE 3 PHASE dated 11/08/2012  RADIOPHARMACEUTICALS:  Forty-five mCi Tc-43m DTPA aerosol and 3 mCi Tc-33m MAA  FINDINGS: Ventilation: There is a large ventilation defect within the posterior right lower lobe. Poor ventilation to the left lower lobe additionally which is not as well demarcated.   Perfusion: There is a large focal perfusion defect in the right lower lobe which matches near exactly the ventilation defect. Four perfusion to the left lung base also matches the poor ventilation.  IMPRESSION: Low probability for acute pulmonary embolism. Matched perfusion and ventilation defect to the lung bases.   Electronically Signed   By: Suzy Bouchard M.D.   On: 03/03/2013 16:57   Dg Chest Port 1 View  03/02/2013   CLINICAL DATA:  Cough and shortness of breath, history of congestive heart failure  EXAM: PORTABLE CHEST - 1 VIEW  COMPARISON:  DG CHEST 2 VIEW dated 02/26/2013; NM BONE 3 PHASE dated 11/08/2012; DG CHEST 2 VIEW dated 08/06/2012  FINDINGS: Mild cardiac enlargement stable. Vascular pattern normal. Left lung is clear. 13 mm nodular opacity lateral right mid lung zone, stable in size but appearing more consistent with resolving atelectasis, with central aeration currently which was not present previously. Bilateral shoulder arthritis.  IMPRESSION: Improved aeration right base. Mild improvement in the appearance of pulmonary nodule lateral right mid lung zone. As recommended in report of the 02/26/2013 study, CT thorax would be recommended to evaluate this nodular opacity. Alternatively, given the partial interval improvement, consider chest radiograph in 1-2 weeks to re-evaluate.   Electronically Signed   By: Skipper Cliche M.D.   On: 03/02/2013 20:24    Microbiology: Recent Results (from the past 240 hour(s))  MRSA PCR SCREENING     Status: None   Collection Time    02/27/13  7:23 AM      Result Value Ref Range Status   MRSA by PCR NEGATIVE  NEGATIVE Final   Comment:            The GeneXpert MRSA Assay (FDA     approved for NASAL specimens     only), is one component of a     comprehensive MRSA colonization     surveillance program. It is not     intended to diagnose MRSA     infection nor to guide or     monitor treatment for     MRSA infections.  CULTURE, BLOOD (ROUTINE X 2)  Status: None   Collection Time    03/03/13  9:55 AM      Result Value Ref Range Status   Specimen Description BLOOD RIGHT ARM   Final   Special Requests BOTTLES DRAWN AEROBIC ONLY [facility]   Final   Culture  Setup Time     Final   Value: 03/03/2013 14:36     Performed at [facility]   Culture     Final   Value:        BLOOD CULTURE RECEIVED NO GROWTH TO DATE CULTURE WILL BE HELD FOR 5 DAYS BEFORE ISSUING A FINAL NEGATIVE REPORT     Performed at [facility]   Report Status PENDING   Incomplete  CULTURE, BLOOD (ROUTINE X 2)     Status: None   Collection Time    03/03/13 10:05 AM      Result Value Ref Range Status   Specimen Description BLOOD RIGHT FOREARM   Final   Special Requests BOTTLES DRAWN AEROBIC ONLY 3[facility]   Final   Culture  Setup Time     Final   Value: 03/03/2013 14:36     Performed at [facility]   Culture     Final   Value:        BLOOD CULTURE RECEIVED NO GROWTH TO DATE CULTURE WILL BE HELD FOR 5 DAYS BEFORE ISSUING A FINAL NEGATIVE REPORT     Performed at [facility]   Report Status PENDING   Incomplete     Labs: Basic Metabolic Panel:  Recent Labs Lab 03/03/13 0540 03/04/13 0334 03/05/13 0335 03/06/13 0535 03/07/13 0720  NA 138 136* 143 139 136*  K 5.2 4.8 4.0 4.8 5.1  CL 99 97 103 101 100  CO2 25 22 25 22 21   GLUCOSE 151* 122* 110* 109* 102*  BUN 69* 79* 34* 87* 84*  CREATININE 3.90* 3.93* 1.72* 3.77* 3.78*  CALCIUM 8.9 8.9 7.7* 9.2 9.3   Liver Function Tests:  Recent Labs Lab 03/03/13 0540  AST 27  ALT 29  ALKPHOS 109  BILITOT 0.4  PROT 6.5  ALBUMIN 3.3*   No results found for this basename: LIPASE, AMYLASE,  in the last 168 hours No results found for this basename: AMMONIA,  in the last 168 hours CBC:  Recent Labs Lab 03/03/13 0540 03/04/13 0334 03/05/13 0340 03/06/13 0535 03/07/13 0720  WBC 18.9* 12.7* 9.0 9.8 8.9  NEUTROABS 16.3*  --   --   --   --   HGB 12.4* 11.7* 11.0* 12.2* 12.5*  HCT 36.5*  34.0* 32.2* 36.2* 37.0*  MCV 93.1 93.2 94.2 93.5 94.1  PLT 225 205 234 301 302   Cardiac Enzymes:  Recent Labs Lab 03/02/13 1929 03/03/13 0540 03/03/13 0900 03/03/13 1415  TROPONINI <0.30 <0.30 <0.30 <0.30   BNP: BNP (last 3 results)  Recent Labs  02/26/13 2025 02/28/13 1614 03/02/13 1929  PROBNP 4115.0* 3385.0* 2760.0*   CBG: No results found for this basename: GLUCAP,  in the last 168 hours     Signed:  Eliseo Squires, Shakthi Scipio  [facility] 03/07/2013, 12:51 PM

## 2013-03-07 NOTE — Progress Notes (Signed)
   CARE MANAGEMENT NOTE 03/07/2013  Patient:  [name], [name]   Account Number:  [id number]  Date Initiated:  03/07/2013  Documentation initiated by:  Lizabeth Leyden  Subjective/Objective Assessment:   admitted with SOB, wheezing  INPT 02/26/13 - 03/02/13  non stemi     Action/Plan:   home health PT   Anticipated DC Date:  03/07/2013   Anticipated DC Plan:  [place]  CM consult      Choice offered to / List presented to:             Status of service:  Completed, signed off Medicare Important Message given?   (If response is "NO", the following Medicare IM given date fields will be blank) Date Medicare IM given:   Date Additional Medicare IM given:    Discharge Disposition:    Per UR Regulation:    If discussed at Long Length of Stay Meetings, dates discussed:    Comments:  03/07/2013  [place], [place] CM referral: home health PT  Met with patient regarding home health PT services, offer choice. Patient stated he did not want any home health services. MD aware.

## 2013-03-07 NOTE — Progress Notes (Signed)
Discharge instructions reviewed with pt. Allowed time for questions. Pt verbalized understanding. IV removed. Pt leaving unit via wheelchair with NT and daughter.

## 2013-03-07 NOTE — Progress Notes (Signed)
PT Cancellation Note  Patient Details Name: [name] MRN: [id number] DOB: [birth year]   Cancelled Treatment:    Reason Eval/Treat Not Completed: Patient declined, no reason specified.  Patient reports he is being d/c'ed soon.  Prefers to skip PT to save energy for going home.   Despina Pole 03/07/2013, 1:02 PM Carita Pian Sanjuana Kava, [place] Pager [phone]

## 2013-03-09 LAB — CULTURE, BLOOD (ROUTINE X 2)
CULTURE: NO GROWTH
Culture: NO GROWTH

## 2013-03-18 ENCOUNTER — Encounter: Payer: Medicare Other | Admitting: Nurse Practitioner

## 2013-03-18 ENCOUNTER — Encounter: Payer: Self-pay | Admitting: Nurse Practitioner

## 2013-03-18 ENCOUNTER — Telehealth: Payer: Self-pay | Admitting: *Deleted

## 2013-03-18 ENCOUNTER — Ambulatory Visit (INDEPENDENT_AMBULATORY_CARE_PROVIDER_SITE_OTHER): Payer: Medicare Other | Admitting: Nurse Practitioner

## 2013-03-18 VITALS — BP 160/80 | HR 56 | Ht 63.0 in | Wt 162.8 lb

## 2013-03-18 DIAGNOSIS — I5022 Chronic systolic (congestive) heart failure: Secondary | ICD-10-CM

## 2013-03-18 LAB — BASIC METABOLIC PANEL
BUN: 70 mg/dL — ABNORMAL HIGH (ref 6–23)
CO2: 28 mEq/L (ref 19–32)
Calcium: 9 mg/dL (ref 8.4–10.5)
Chloride: 100 mEq/L (ref 96–112)
Creatinine, Ser: 3.2 mg/dL — ABNORMAL HIGH (ref 0.4–1.5)
GFR: 19.71 mL/min — ABNORMAL LOW (ref >60.00)
Glucose, Bld: 98 mg/dL (ref 70–99)
Potassium: 4.7 mEq/L (ref 3.5–5.1)
Sodium: 135 mEq/L (ref 135–145)

## 2013-03-18 NOTE — Telephone Encounter (Signed)
S/w pt's wife r/s appt @ 2:30 today 2/27

## 2013-03-18 NOTE — Patient Instructions (Addendum)
Stay on your current medicines  Weigh yourself each morning and record.  Take extra dose of diuretic for weight gain of 3 pounds in 24 hours.   Monitor the blood pressure and keep a diary  Limit sodium intake. Goal is to have less than 2000 mg (2gm) of salt per day.  See Dr. Mare Ferrari in a month  We will check labs today  Call the [place] office at [phone] if you have any questions, problems or concerns.

## 2013-03-18 NOTE — Progress Notes (Signed)
[name] Date of Birth: 06/20/1928 Medical Record #[id number]  History of Present Illness: Mr. Aguas is seen back today for a post hospital visit. Seen for Dr. Mare Ferrari. He has multiple issues. He is an 80 year old with known CAD with prior MI, CKD, HTN, chronic systolic and diastolic HF with an EF of 45 to 50%, gout and HLD.   Last seen here in January.   Most recently admitted after just being discharged 2 days prior for a hypertensive crisis, HF and NSTEMI who presented with worsening SOB. Looks like he was managed conservatively and he improved with diuresis. Went back with acute shortness of breath and cough - noted to have the flu (A). Treated with tamiflu, empiric antibiotics and home medicines.   Comes in today. Here with family. He is doing ok. Family notes that he is doing better. 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 No chest pain. Breathing has improved. Weight is stable. No swelling. Not dizzy. Family thinks he has improved.    Current Outpatient Prescriptions  Medication Sig Dispense Refill  . acetaminophen (TYLENOL) 325 MG tablet Take 2 tablets (650 mg total) by mouth every 4 (four) hours as needed for headache or mild pain.      Marland Kitchen albuterol (PROVENTIL HFA;VENTOLIN HFA) 108 (90 BASE) MCG/ACT inhaler Inhale 2 puffs into the lungs every 6 (six) hours as needed for wheezing or shortness of breath.      . allopurinol (ZYLOPRIM) 100 MG tablet Take 50 mg by mouth daily. 1/2 tablet daily      . aspirin 81 MG tablet Take 81 mg by mouth every other day.        . clopidogrel (PLAVIX) 75 MG tablet Take 1 tablet (75 mg total) by mouth daily.  90 tablet  3  . furosemide (LASIX) 20 MG tablet Take 1 tablet (20 mg total) by mouth daily.  30 tablet  0  . hydrALAZINE (APRESOLINE) 50 MG tablet Take 1.5 tablets (75 mg total) by mouth 3 (three) times daily.  100 tablet  11  . isosorbide mononitrate (IMDUR) 30  MG 24 hr tablet Take 1 tablet (30 mg total) by mouth daily.  30 tablet  11  . metoprolol tartrate (LOPRESSOR) 25 MG tablet Take 1 tablet (25 mg total) by mouth 2 (two) times daily.  60 tablet  11  . nitroGLYCERIN (NITROSTAT) 0.4 MG SL tablet Place 1 tablet (0.4 mg total) under the tongue every 5 (five) minutes as needed.  25 tablet  5   No current facility-administered medications for this visit.    Allergies  Allergen Reactions  . Crestor [Rosuvastatin Calcium]     MUSCLE PAIN  . Gentamycin [Gentamicin]     Decreased kidney fx  . Hctz [Hydrochlorothiazide]     dizziness  . Lipitor [Atorvastatin Calcium]     MUSCLE PAIN    Past Medical History  Diagnosis Date  . Hypertension   . Gout   . IHD (ischemic heart disease)     Remote MI in 2009 with late presentation; no reperfusion. Has large area of infarct in the apical anterior area on last nuclear in 2011. Managed medically  . Chronic renal insufficiency     creatinine ranges around 2.8  . OA (osteoarthritis) of knee     Left knee with revision and replacement of prosthetic L TKR  . MI, old 03/2007  ACUTE ANTEROSEPTAL  . DOE (dyspnea on exertion)   . Herpes zoster 12/2009  . Peripheral edema   . RBBB (right bundle branch block)   . History of BPH   . Cancer   . Renal insufficiency   . Asthma     Past Surgical History  Procedure Laterality Date  . Cardiac catheterization  03/24/2007  . Total knee arthroplasty      left knee  . Cardiovascular stress test  04/02/2009    EF 27% AND A LARGE AREA OF INFARCT IN THE APICAL ANTERIOR WITH MILD PERI-INFARCT ISCHEMIA  . Transthoracic echocardiogram  04/04/2009    EF 45-50%  . Knee surgery  1998    left knee    History  Smoking status  . Never Smoker   Smokeless tobacco  . Never Used    History  Alcohol Use No    Family History  Problem Relation Age of Onset  . Coronary artery disease Mother   . Emphysema Father     never smoked, worked as a Psychologist, sport and exercise    Review of  Systems: The review of systems is per the HPI.  All other systems were reviewed and are negative.  Physical Exam: BP 160/80  Pulse 56  Ht 5\' 3"  (1.6 m)  Wt 162 lb 12.8 oz (73.846 kg)  BMI 28.85 kg/m2  SpO2 97% BP by me is 140/70 Patient is alert and in no acute distress. Skin is warm and dry. Color is normal.  HEENT is unremarkable. Normocephalic/atraumatic. PERRL. Sclera are nonicteric. Neck is supple. No masses. No JVD. Lungs are clear. Cardiac exam shows a regular rate and rhythm. Abdomen is soft. Extremities are without edema. Gait and ROM are intact. No gross neurologic deficits noted.  LABORATORY DATA: PENDING  Lab Results  Component Value Date   WBC 8.9 03/07/2013   HGB 12.5* 03/07/2013   HCT 37.0* 03/07/2013   PLT 302 03/07/2013   GLUCOSE 102* 03/07/2013   CHOL 158 02/01/2013   TRIG 50.0 02/01/2013   HDL 53.20 02/01/2013   LDLCALC 95 02/01/2013   ALT 29 03/03/2013   AST 27 03/03/2013   NA 136* 03/07/2013   K 5.1 03/07/2013   CL 100 03/07/2013   CREATININE 3.78* 03/07/2013   BUN 84* 03/07/2013   CO2 21 03/07/2013   TSH 1.032 03/03/2013   INR 1.70* 09/05/2010   Echo Study Conclusions from February 2015  - Left ventricle: The cavity size was normal. Wall thickness was normal. Systolic function was mildly to moderately reduced. The estimated ejection fraction was in the range of 40% to 45%. Dyskinesis and aneurysmal deformity of the inferior and septal segments of the apex; consistent with infarction in the distribution of the left anterior descending coronary artery. Moderate hypokinesis of the mid-distalanteroseptal and anterior myocardium. There was a possible, flat (mural), fixedthrombus. - Pulmonary arteries: Systolic pressure was mildly increased. PA peak pressure: 39mm Hg (S).   Assessment / Plan: 1. Combined systolic and diastolic HF - managed medically. Weight is stable at home. HHN is coming next week for some teaching. Explained the importance of restricting salt,  weights daily and using extra Lasix prn.   2. CKD - recheck BMET today  3. CAD - recent NSTEMI - managed medically  4. HTN - recheck by me is ok - I have asked his family to monitor.   Will allow him to increase his activities a little more - some of this is dependent on the weather. See Dr. Mare Ferrari back  in a month.   Patient is agreeable to this plan and will call if any problems develop in the interim.   Burtis Junes, RN, [facility] [address] [place], [place]  [address] [phone]

## 2013-03-21 ENCOUNTER — Other Ambulatory Visit: Payer: Self-pay | Admitting: Cardiology

## 2013-04-11 ENCOUNTER — Ambulatory Visit (INDEPENDENT_AMBULATORY_CARE_PROVIDER_SITE_OTHER): Payer: Medicare Other | Admitting: Cardiology

## 2013-04-11 ENCOUNTER — Encounter: Payer: Self-pay | Admitting: Cardiology

## 2013-04-11 VITALS — BP 144/66 | HR 59 | Ht 63.0 in | Wt 161.0 lb

## 2013-04-11 DIAGNOSIS — I255 Ischemic cardiomyopathy: Secondary | ICD-10-CM

## 2013-04-11 DIAGNOSIS — I2589 Other forms of chronic ischemic heart disease: Secondary | ICD-10-CM

## 2013-04-11 DIAGNOSIS — I119 Hypertensive heart disease without heart failure: Secondary | ICD-10-CM

## 2013-04-11 DIAGNOSIS — I2510 Atherosclerotic heart disease of native coronary artery without angina pectoris: Secondary | ICD-10-CM

## 2013-04-11 LAB — BASIC METABOLIC PANEL
BUN: 80 mg/dL — AB (ref 6–23)
CALCIUM: 9.1 mg/dL (ref 8.4–10.5)
CHLORIDE: 105 meq/L (ref 96–112)
CO2: 27 mEq/L (ref 19–32)
Creatinine, Ser: 3.4 mg/dL — ABNORMAL HIGH (ref 0.4–1.5)
GFR: 18.38 mL/min — ABNORMAL LOW (ref >60.00)
GLUCOSE: 100 mg/dL — AB (ref 70–99)
Potassium: 4 mEq/L (ref 3.5–5.1)
Sodium: 140 mEq/L (ref 135–145)

## 2013-04-11 LAB — LIPID PANEL
CHOLESTEROL: 152 mg/dL (ref 0–200)
HDL: 52.8 mg/dL (ref >39.00)
LDL Cholesterol: 90 mg/dL (ref 0–99)
Total CHOL/HDL Ratio: 3
Triglycerides: 48 mg/dL (ref 0.0–149.0)
VLDL: 9.6 mg/dL (ref 0.0–40.0)

## 2013-04-11 LAB — HEPATIC FUNCTION PANEL
ALK PHOS: 84 U/L (ref 39–117)
ALT: 17 U/L (ref 0–53)
AST: 17 U/L (ref 0–37)
Albumin: 3.9 g/dL (ref 3.5–5.2)
BILIRUBIN TOTAL: 0.8 mg/dL (ref 0.3–1.2)
Bilirubin, Direct: 0.1 mg/dL (ref 0.0–0.3)
TOTAL PROTEIN: 6.5 g/dL (ref 6.0–8.3)

## 2013-04-11 NOTE — Progress Notes (Signed)
[name] Date of Birth:  [birth year] [address] [name], [place]  [address] [phone]         Fax   [phone]  History of Present Illness: This pleasant 80 year old gentleman is seen for a  followup office visit. He has a past history of the large anterior wall myocardial infarction with late presentation.  That was about 5 years ago.  The patient has a history of high blood pressure and chronic kidney disease. His last ejection fraction by echo in 02/27/13 showed an ejection fraction 40-45% with wall motion abnormalities.. His last nuclear stress test in March 2011 showed a large area of infarct with only minimal peri-infarct ischemia and an ejection fraction of 27%. He is managed medically. He has not been experiencing any chest pain but has had some exertional dyspnea which is chronic. He recently developed worsening peripheral edema after we increased his amlodipine from 5 mg up to 10 mg daily. His edema has improved since his PCP Dr. Delphina Cahill switched him from amlodipine to hydralazine.    The patient has a history of occasional gout . The patient remains on renal dose allopurinol.   Current Outpatient Prescriptions  Medication Sig Dispense Refill  . acetaminophen (TYLENOL) 325 MG tablet Take 2 tablets (650 mg total) by mouth every 4 (four) hours as needed for headache or mild pain.      Marland Kitchen albuterol (PROVENTIL HFA;VENTOLIN HFA) 108 (90 BASE) MCG/ACT inhaler Inhale 2 puffs into the lungs every 6 (six) hours as needed for wheezing or shortness of breath.      . allopurinol (ZYLOPRIM) 100 MG tablet Take 50 mg by mouth daily. 1/2 tablet daily      . aspirin 81 MG tablet Take 81 mg by mouth every other day.        . clopidogrel (PLAVIX) 75 MG tablet Take 1 tablet (75 mg total) by mouth daily.  90 tablet  3  . furosemide (LASIX) 20 MG tablet Take 1 tablet (20 mg total) by mouth daily.  30 tablet  0  . hydrALAZINE (APRESOLINE) 50 MG tablet Take 1.5 tablets (75 mg  total) by mouth 3 (three) times daily.  100 tablet  11  . isosorbide mononitrate (IMDUR) 30 MG 24 hr tablet Take 1 tablet (30 mg total) by mouth daily.  30 tablet  11  . metoprolol tartrate (LOPRESSOR) 25 MG tablet Take 1 tablet (25 mg total) by mouth 2 (two) times daily.  60 tablet  11  . nitroGLYCERIN (NITROSTAT) 0.4 MG SL tablet Place 1 tablet (0.4 mg total) under the tongue every 5 (five) minutes as needed.  25 tablet  5   No current facility-administered medications for this visit.    Allergies  Allergen Reactions  . Crestor [Rosuvastatin Calcium]     MUSCLE PAIN  . Gentamycin [Gentamicin]     Decreased kidney fx  . Hctz [Hydrochlorothiazide]     dizziness  . Lipitor [Atorvastatin Calcium]     MUSCLE PAIN    Patient Active Problem List   Diagnosis Date Noted  . Acute respiratory failure with hypoxia 03/03/2013  . HCAP (healthcare-associated pneumonia) 03/03/2013  . Influenza A 03/03/2013  . Cardiomyopathy, ischemic- EF 45% 2D 02/27/13 03/01/2013  . CAD (coronary artery disease)- MI in '09 02/28/2013  . Mural thrombus of heart-old, no need for anticoagulation 02/28/2013  . Acute on chronic combined systolic and diastolic congestive heart failure, NYHA class 4 02/28/2013  . NSTEMI (non-ST elevated  myocardial infarction)- type 2 02/28/2013  . CKD (chronic kidney disease), stage IV 02/26/2013  . Osteoarthritis 07/15/2010  . Ischemic heart disease 07/15/2010  . Right bundle branch block 07/15/2010  . Benign hypertensive heart disease without heart failure 07/15/2010    History  Smoking status  . Never Smoker   Smokeless tobacco  . Never Used    History  Alcohol Use No    Family History  Problem Relation Age of Onset  . Coronary artery disease Mother   . Emphysema Father     never smoked, worked as a Psychologist, sport and exercise    Review of Systems: Constitutional: no fever chills diaphoresis or fatigue or change in weight.  Head and neck: no hearing loss, no epistaxis, no  photophobia or visual disturbance. Respiratory: No cough, shortness of breath or wheezing. Cardiovascular: No chest pain peripheral edema, palpitations. Gastrointestinal: No abdominal distention, no abdominal pain, no change in bowel habits hematochezia or melena. Genitourinary: No dysuria, no frequency, no urgency, no nocturia. Musculoskeletal:No arthralgias, no back pain, no gait disturbance or myalgias. Neurological: No dizziness, no headaches, no numbness, no seizures, no syncope, no weakness, no tremors. Hematologic: No lymphadenopathy, no easy bruising. Psychiatric: No confusion, no hallucinations, no sleep disturbance.    Physical Exam: Filed Vitals:   04/11/13 1035  BP: 144/66  Pulse: 59   repeat blood pressure is 136/78 right arm sitting relaxed.The head and neck exam reveals pupils equal and reactive.  Extraocular movements are full.  There is no scleral icterus.  The mouth and pharynx are normal.  The neck is supple.  The carotids reveal no bruits.  The jugular venous pressure is normal.  The  thyroid is not enlarged.  There is no lymphadenopathy.  The chest is clear to percussion and auscultation.  There are no rales or rhonchi.  Expansion of the chest is symmetrical.  The precordium is quiet.  The first heart sound is normal.  The second heart sound is physiologically split.  There is no murmur gallop rub or click.  There is no abnormal lift or heave.  The abdomen is soft and nontender.  The bowel sounds are normal.  The liver and spleen are not enlarged.  There are no abdominal masses.  There are no abdominal bruits.  Extremities reveal good pedal pulses.  There is no phlebitis or edema.  There is no cyanosis or clubbing.  Strength is decreased on right side and the patient walks with a crutch .There are no sensory deficits.  The skin is warm and dry.  There is no rash.     Assessment / Plan: Overall the patient is stable from the cardiovascular standpoint.  He will monitor his  blood pressure and weight at home.  Continue same medication.  Recheck in 4 months for followup office visit, EKG, lipid panel hepatic function panel and basal metabolic panel.  Blood work today pending.  He has an appointment to see his nephrologist in [place] next week.

## 2013-04-11 NOTE — Progress Notes (Signed)
Quick Note:  Please report to patient. The recent labs are stable. Continue same medication and careful diet. ______ 

## 2013-04-11 NOTE — Assessment & Plan Note (Signed)
Blood pressure was remaining stable on current therapy.  No headaches dizziness or syncope.

## 2013-04-11 NOTE — Assessment & Plan Note (Signed)
The patient has a past history of severe anterior wall myocardial infarction with late presentation in 2009.  His most recent echocardiogram shows an ejection fraction of 40-45%.  The patient has not been having any chest pain.  He has not had to take any nitroglycerin.

## 2013-04-11 NOTE — Assessment & Plan Note (Signed)
The patient has been weighing himself each day.  He takes Lasix 20 mg daily.  He has had only one day in which he had to take extra Lasix because of a 3 pound weight gain.  His breathing has been stable.  He had been admitted to the hospital 02/26/2013 for combined heart failure and influenza.

## 2013-04-11 NOTE — Patient Instructions (Signed)

## 2013-04-12 ENCOUNTER — Telehealth: Payer: Self-pay | Admitting: *Deleted

## 2013-04-12 NOTE — Telephone Encounter (Signed)
Left message labs stable and viewable in mychart. If any questions to call

## 2013-04-12 NOTE — Telephone Encounter (Signed)
Message copied by Earvin Hansen on Tue Apr 12, 2013  2:48 PM ------      Message from: Darlin Coco      Created: Mon Apr 11, 2013  9:21 PM       Please report to patient.  The recent labs are stable. Continue same medication and careful diet. ------

## 2013-06-23 ENCOUNTER — Other Ambulatory Visit (HOSPITAL_COMMUNITY): Payer: Self-pay | Admitting: Nephrology

## 2013-06-23 DIAGNOSIS — N289 Disorder of kidney and ureter, unspecified: Secondary | ICD-10-CM

## 2013-06-24 ENCOUNTER — Ambulatory Visit (HOSPITAL_COMMUNITY)
Admission: RE | Admit: 2013-06-24 | Discharge: 2013-06-24 | Disposition: A | Discharge status: Home / Self Care | Payer: Medicare Other | Source: Ambulatory Visit | Attending: Nephrology | Admitting: Nephrology

## 2013-06-24 DIAGNOSIS — R109 Unspecified abdominal pain: Secondary | ICD-10-CM | POA: Insufficient documentation

## 2013-06-24 DIAGNOSIS — N289 Disorder of kidney and ureter, unspecified: Secondary | ICD-10-CM | POA: Insufficient documentation

## 2013-07-12 ENCOUNTER — Other Ambulatory Visit: Payer: Self-pay | Admitting: Cardiology

## 2013-08-31 ENCOUNTER — Ambulatory Visit (INDEPENDENT_AMBULATORY_CARE_PROVIDER_SITE_OTHER): Payer: Medicare Other | Admitting: Cardiology

## 2013-08-31 ENCOUNTER — Other Ambulatory Visit: Payer: Medicare Other

## 2013-08-31 ENCOUNTER — Encounter: Payer: Self-pay | Admitting: Cardiology

## 2013-08-31 VITALS — BP 136/70 | HR 60 | Ht 63.0 in | Wt 162.0 lb

## 2013-08-31 DIAGNOSIS — I119 Hypertensive heart disease without heart failure: Secondary | ICD-10-CM

## 2013-08-31 DIAGNOSIS — I255 Ischemic cardiomyopathy: Secondary | ICD-10-CM

## 2013-08-31 DIAGNOSIS — M1610 Unilateral primary osteoarthritis, unspecified hip: Secondary | ICD-10-CM

## 2013-08-31 DIAGNOSIS — I4510 Unspecified right bundle-branch block: Secondary | ICD-10-CM

## 2013-08-31 DIAGNOSIS — M1611 Unilateral primary osteoarthritis, right hip: Secondary | ICD-10-CM

## 2013-08-31 DIAGNOSIS — I2589 Other forms of chronic ischemic heart disease: Secondary | ICD-10-CM

## 2013-08-31 DIAGNOSIS — I509 Heart failure, unspecified: Secondary | ICD-10-CM

## 2013-08-31 DIAGNOSIS — I259 Chronic ischemic heart disease, unspecified: Secondary | ICD-10-CM

## 2013-08-31 DIAGNOSIS — I5043 Acute on chronic combined systolic (congestive) and diastolic (congestive) heart failure: Secondary | ICD-10-CM

## 2013-08-31 DIAGNOSIS — R262 Difficulty in walking, not elsewhere classified: Secondary | ICD-10-CM

## 2013-08-31 LAB — BASIC METABOLIC PANEL
BUN: 68 mg/dL — AB (ref 6–23)
CHLORIDE: 108 meq/L (ref 96–112)
CO2: 25 meq/L (ref 19–32)
CREATININE: 3.6 mg/dL — AB (ref 0.4–1.5)
Calcium: 9.4 mg/dL (ref 8.4–10.5)
GFR: 17.25 mL/min — ABNORMAL LOW (ref >60.00)
GLUCOSE: 96 mg/dL (ref 70–99)
POTASSIUM: 4 meq/L (ref 3.5–5.1)
Sodium: 141 mEq/L (ref 135–145)

## 2013-08-31 LAB — HEPATIC FUNCTION PANEL
ALK PHOS: 109 U/L (ref 39–117)
ALT: 16 U/L (ref 0–53)
AST: 19 U/L (ref 0–37)
Albumin: 3.8 g/dL (ref 3.5–5.2)
BILIRUBIN DIRECT: 0.1 mg/dL (ref 0.0–0.3)
BILIRUBIN TOTAL: 0.7 mg/dL (ref 0.2–1.2)
Total Protein: 6.6 g/dL (ref 6.0–8.3)

## 2013-08-31 LAB — LIPID PANEL
CHOL/HDL RATIO: 4
Cholesterol: 142 mg/dL (ref 0–200)
HDL: 39.4 mg/dL (ref >39.00)
LDL CALC: 94 mg/dL (ref 0–99)
NONHDL: 102.6
Triglycerides: 42 mg/dL (ref 0.0–149.0)
VLDL: 8.4 mg/dL (ref 0.0–40.0)

## 2013-08-31 NOTE — Assessment & Plan Note (Signed)
Blood pressure has been remaining stable on current therapy. 

## 2013-08-31 NOTE — Addendum Note (Signed)
Addended by: Earvin Hansen on: 08/31/2013 06:44 PM   Modules accepted: Orders

## 2013-08-31 NOTE — Progress Notes (Signed)
[name] Date of Birth:  1928-09-18 [name] [address] [place], [name]  [address] [phone]        Fax   [phone]   History of Present Illness: This pleasant 80 year old gentleman is seen for a followup office visit. He has a past history of the large anterior wall myocardial infarction with late presentation. That was about 5 years ago. The patient has a history of high blood pressure and chronic kidney disease. His last ejection fraction by echo in 02/27/13 showed an ejection fraction 40-45% with wall motion abnormalities.. His last nuclear stress test in March 2011 showed a large area of infarct with only minimal peri-infarct ischemia and an ejection fraction of 27%. He is managed medically. He has not been experiencing any chest pain but has had some exertional dyspnea which is chronic. He recently developed worsening peripheral edema after we increased his amlodipine from 5 mg up to 10 mg daily. His edema has improved since his PCP Dr. Delphina [name] switched him from amlodipine to hydralazine.  The patient has a history of occasional gout . The patient remains on renal dose allopurinol. He has chronic renal disease and is followed by a nephrologist in [place].  He has severe problems with his right hip.  He has had a previous reconstruction right hip socket which is now deteriorating.  His orthopedist as indicated no further surgery is possible.  The patient is walking with a crutch.  Current Outpatient Prescriptions  Medication Sig Dispense Refill  . acetaminophen (TYLENOL) 325 MG tablet Take 2 tablets (650 mg total) by mouth every 4 (four) hours as needed for headache or mild pain.      Marland Kitchen albuterol (PROVENTIL HFA;VENTOLIN HFA) 108 (90 BASE) MCG/ACT inhaler Inhale 2 puffs into the lungs every 6 (six) hours as needed for wheezing or shortness of breath.      . allopurinol (ZYLOPRIM) 100 MG tablet Take 50 mg by mouth daily. 1/2 tablet daily      .  aspirin 81 MG tablet Take 81 mg by mouth every other day.        . clopidogrel (PLAVIX) 75 MG tablet Take 1 tablet (75 mg total) by mouth daily.  90 tablet  3  . furosemide (LASIX) 20 MG tablet Take 1 tablet (20 mg total) by mouth daily.  30 tablet  0  . hydrALAZINE (APRESOLINE) 50 MG tablet Take 1.5 tablets (75 mg total) by mouth 3 (three) times daily.  100 tablet  11  . isosorbide mononitrate (IMDUR) 30 MG 24 hr tablet Take 1 tablet (30 mg total) by mouth daily.  30 tablet  11  . metoprolol tartrate (LOPRESSOR) 25 MG tablet Take 1 tablet (25 mg total) by mouth 2 (two) times daily.  60 tablet  11  . nitroGLYCERIN (NITROSTAT) 0.4 MG SL tablet Place 1 tablet (0.4 mg total) under the tongue every 5 (five) minutes as needed.  25 tablet  5  . allopurinol (ZYLOPRIM) 100 MG tablet TAKE ONE-HALF TABLET BY MOUTH ONCE A DAY  30 tablet  0   No current facility-administered medications for this visit.    Allergies  Allergen Reactions  . Crestor [Rosuvastatin Calcium]     MUSCLE PAIN  . Gentamycin [Gentamicin]     Decreased kidney fx  . Hctz [Hydrochlorothiazide]     dizziness  . Lipitor [Atorvastatin Calcium]     MUSCLE PAIN    Patient Active Problem List   Diagnosis Date  Noted  . Acute respiratory failure with hypoxia 03/03/2013  . HCAP (healthcare-associated pneumonia) 03/03/2013  . Influenza A 03/03/2013  . Cardiomyopathy, ischemic- EF 45% 2D 02/27/13 03/01/2013  . CAD (coronary artery disease)- MI in '09 02/28/2013  . Mural thrombus of heart-old, no need for anticoagulation 02/28/2013  . Acute on chronic combined systolic and diastolic congestive heart failure, NYHA class 4 02/28/2013  . NSTEMI (non-ST elevated myocardial infarction)- type 2 02/28/2013  . CKD (chronic kidney disease), stage IV 02/26/2013  . Osteoarthritis 07/15/2010  . Ischemic heart disease 07/15/2010  . Right bundle branch block 07/15/2010  . Benign hypertensive heart disease without heart failure 07/15/2010     History  Smoking status  . Never Smoker   Smokeless tobacco  . Never Used    History  Alcohol Use No    Family History  Problem Relation Age of Onset  . Coronary artery disease Mother   . Emphysema Father     never smoked, worked as a Psychologist, sport and exercise    Review of Systems: Constitutional: no fever chills diaphoresis or fatigue or change in weight.  Head and neck: no hearing loss, no epistaxis, no photophobia or visual disturbance. Respiratory: No cough, shortness of breath or wheezing. Cardiovascular: No chest pain peripheral edema, palpitations. Gastrointestinal: No abdominal distention, no abdominal pain, no change in bowel habits hematochezia or melena. Genitourinary: No dysuria, no frequency, no urgency, no nocturia. Musculoskeletal:No arthralgias, no back pain, no gait disturbance or myalgias. Neurological: No dizziness, no headaches, no numbness, no seizures, no syncope, no weakness, no tremors. Hematologic: No lymphadenopathy, no easy bruising. Psychiatric: No confusion, no hallucinations, no sleep disturbance.    Physical Exam: Filed Vitals:   08/31/13 0845  BP: 136/70  Pulse: 60   the general appearance reveals a well-developed elderly gentleman in no distress.  He walks with a severe limp.The head and neck exam reveals pupils equal and reactive.  Extraocular movements are full.  There is no scleral icterus.  The mouth and pharynx are normal.  The neck is supple.  The carotids reveal no bruits.  The jugular venous pressure is normal.  The  thyroid is not enlarged.  There is no lymphadenopathy.  The chest is clear to percussion and auscultation.  There are no rales or rhonchi.  Expansion of the chest is symmetrical.  The precordium is quiet.  The first heart sound is normal.  The second heart sound is physiologically split.  There is no murmur gallop rub or click.  There is no abnormal lift or heave.  The abdomen is soft and nontender.  The bowel sounds are normal.  The liver  and spleen are not enlarged.  There are no abdominal masses.  There are no abdominal bruits.  Extremities reveal good pedal pulses.  There is no phlebitis or edema.  There is no cyanosis or clubbing.  Strength is normal and symmetrical in all extremities.  There is no lateralizing weakness.  Limited motion of right hip.  There are no sensory deficits.  The skin is warm and dry.  There is no rash.     Assessment / Plan: 1.  Ischemic heart disease status post old large anterior wall myocardial infarction with late presentation, on dual antiplatelet therapy. 2. chronic renal insufficiency stage IV 3. essential hypertension without heart failure. 4. ischemic cardiomyopathy with ejection fraction 45% by echocardiogram 02/27/13\ 5. right bundle branch block 6. dyslipidemia with statin intolerance 7. painful osteoarthritic right hip, further surgery not a possibility, followed by Dr.  Aluisio  Plan: We are checking lab work today.  Continue same medication.  Recheck in 4 months for office visit CBC lipid panel hepatic function panel and basal metabolic panel.

## 2013-08-31 NOTE — Patient Instructions (Signed)
Will obtain labs today and call you with the results (lp.bmet/hfp)  Your physician recommends that you continue on your current medications as directed. Please refer to the Current Medication list given to you today.  Your physician recommends that you schedule a follow-up appointment in: 4 months with fasting labs (lp/bmet/hfp/cbc)

## 2013-08-31 NOTE — Assessment & Plan Note (Signed)
Now walking with a crutch.  His activity is limited.

## 2013-08-31 NOTE — Progress Notes (Signed)
Quick Note:  Please report to patient. The recent labs are stable. Continue same medication and careful diet. ______ 

## 2013-08-31 NOTE — Assessment & Plan Note (Signed)
The patient has not been having any acute dyspnea.  He takes an occasional extra Lasix.  He has very little peripheral edema.  He has not been having any chest pains.

## 2013-08-31 NOTE — Assessment & Plan Note (Signed)
The patient has had no recurrent chest pain.  He has not had to take any sublingual nitroglycerin ever.

## 2013-09-13 ENCOUNTER — Other Ambulatory Visit: Payer: Self-pay | Admitting: Cardiology

## 2013-11-04 ENCOUNTER — Other Ambulatory Visit: Payer: Self-pay

## 2014-01-02 ENCOUNTER — Encounter: Payer: Self-pay | Admitting: Cardiology

## 2014-01-02 ENCOUNTER — Ambulatory Visit (INDEPENDENT_AMBULATORY_CARE_PROVIDER_SITE_OTHER): Payer: Medicare Other | Admitting: Cardiology

## 2014-01-02 ENCOUNTER — Other Ambulatory Visit (INDEPENDENT_AMBULATORY_CARE_PROVIDER_SITE_OTHER): Payer: Medicare Other | Admitting: *Deleted

## 2014-01-02 VITALS — BP 138/62 | HR 74 | Ht 63.0 in | Wt 171.0 lb

## 2014-01-02 DIAGNOSIS — I119 Hypertensive heart disease without heart failure: Secondary | ICD-10-CM

## 2014-01-02 DIAGNOSIS — I255 Ischemic cardiomyopathy: Secondary | ICD-10-CM

## 2014-01-02 DIAGNOSIS — I2510 Atherosclerotic heart disease of native coronary artery without angina pectoris: Secondary | ICD-10-CM

## 2014-01-02 DIAGNOSIS — N184 Chronic kidney disease, stage 4 (severe): Secondary | ICD-10-CM

## 2014-01-02 DIAGNOSIS — I2583 Coronary atherosclerosis due to lipid rich plaque: Secondary | ICD-10-CM

## 2014-01-02 NOTE — Assessment & Plan Note (Signed)
The patient has not been experiencing any recurrent chest pain or angina. 

## 2014-01-02 NOTE — Assessment & Plan Note (Signed)
The patient is relatively sedentary because of his orthopedic problems.  He sits a lot.  He has not been having any symptoms of increased shortness of breath

## 2014-01-02 NOTE — Progress Notes (Signed)
[name] Date of Birth:  [birth year] [place] [address]  [place], [place]  [address] [phone]        Fax   [phone]   History of Present Illness: This pleasant 80 year old gentleman is seen for a followup office visit. He has a past history of the large anterior wall myocardial infarction with late presentation. That was about 5 years ago. The patient has a history of high blood pressure and chronic kidney disease. His last ejection fraction by echo in 02/27/13 showed an ejection fraction 40-45% with wall motion abnormalities.. His last nuclear stress test in March 2011 showed a large area of infarct with only minimal peri-infarct ischemia and an ejection fraction of 27%. He is managed medically. He has not been experiencing any chest pain but has had some exertional dyspnea which is chronic. He recently developed worsening peripheral edema after we increased his amlodipine from 5 mg up to 10 mg daily. His edema has improved since his PCP Dr. Delphina Cahill switched him from amlodipine to hydralazine.  The patient has a history of occasional gout .  He has not had any recent episodes of gout.  The patient remains on renal dose allopurinol. He has chronic renal disease and is followed by a nephrologist, Dr.Befekadu, in [place].  He has severe problems with his right hip.  He has had a previous reconstruction right hip socket which is now deteriorating.  His orthopedist as indicated no further surgery is possible.  The patient is walking with a walker. The patient has significant problems with urination.  He has frequency and urgency.  Dr. Lawerance Bach is his urologist.  The patient does have nocturia 3-4 times a night.  Current Outpatient Prescriptions  Medication Sig Dispense Refill  . acetaminophen (TYLENOL) 325 MG tablet Take 2 tablets (650 mg total) by mouth every 4 (four) hours as needed for headache or mild pain.    Marland Kitchen albuterol (PROVENTIL HFA;VENTOLIN HFA)  108 (90 BASE) MCG/ACT inhaler Inhale 2 puffs into the lungs every 6 (six) hours as needed for wheezing or shortness of breath.    . allopurinol (ZYLOPRIM) 100 MG tablet Take 50 mg by mouth daily. 1/2 tablet daily    . allopurinol (ZYLOPRIM) 100 MG tablet TAKE ONE-HALF TABLET ONCE DAILY 30 tablet 3  . aspirin 81 MG tablet Take 81 mg by mouth every other day.      . clopidogrel (PLAVIX) 75 MG tablet TAKE ONE (1) TABLET EACH DAY 90 tablet 0  . furosemide (LASIX) 20 MG tablet Take 1 tablet (20 mg total) by mouth daily. 30 tablet 0  . hydrALAZINE (APRESOLINE) 50 MG tablet Take 1.5 tablets (75 mg total) by mouth 3 (three) times daily. 100 tablet 11  . isosorbide mononitrate (IMDUR) 30 MG 24 hr tablet Take 1 tablet (30 mg total) by mouth daily. 30 tablet 11  . metoprolol tartrate (LOPRESSOR) 25 MG tablet Take 1 tablet (25 mg total) by mouth 2 (two) times daily. 60 tablet 11  . nitroGLYCERIN (NITROSTAT) 0.4 MG SL tablet Place 1 tablet (0.4 mg total) under the tongue every 5 (five) minutes as needed. 25 tablet 5   No current facility-administered medications for this visit.    Allergies  Allergen Reactions  . Crestor [Rosuvastatin Calcium]     MUSCLE PAIN  . Gentamycin [Gentamicin]     Decreased kidney fx  . Hctz [Hydrochlorothiazide]     dizziness  . Lipitor [Atorvastatin Calcium]  MUSCLE PAIN    Patient Active Problem List   Diagnosis Date Noted  . Acute respiratory failure with hypoxia 03/03/2013  . HCAP (healthcare-associated pneumonia) 03/03/2013  . Influenza A 03/03/2013  . Cardiomyopathy, ischemic- EF 45% 2D 02/27/13 03/01/2013  . CAD (coronary artery disease)- MI in '09 02/28/2013  . Mural thrombus of heart-old, no need for anticoagulation 02/28/2013  . Acute on chronic combined systolic and diastolic congestive heart failure, NYHA class 4 02/28/2013  . NSTEMI (non-ST elevated myocardial infarction)- type 2 02/28/2013  . CKD (chronic kidney disease), stage IV 02/26/2013  .  Osteoarthritis 07/15/2010  . Ischemic heart disease 07/15/2010  . Right bundle branch block 07/15/2010  . Benign hypertensive heart disease without heart failure 07/15/2010    History  Smoking status  . Never Smoker   Smokeless tobacco  . Never Used    History  Alcohol Use No    Family History  Problem Relation Age of Onset  . Coronary artery disease Mother   . Emphysema Father     never smoked, worked as a Psychologist, sport and exercise    Review of Systems: Constitutional: no fever chills diaphoresis or fatigue or change in weight.  Head and neck: no hearing loss, no epistaxis, no photophobia or visual disturbance. Respiratory: No cough, shortness of breath or wheezing. Cardiovascular: No chest pain peripheral edema, palpitations. Gastrointestinal: No abdominal distention, no abdominal pain, no change in bowel habits hematochezia or melena. Genitourinary: No dysuria, no frequency, no urgency, no nocturia. Musculoskeletal:No arthralgias, no back pain, no gait disturbance or myalgias. Neurological: No dizziness, no headaches, no numbness, no seizures, no syncope, no weakness, no tremors. Hematologic: No lymphadenopathy, no easy bruising. Psychiatric: No confusion, no hallucinations, no sleep disturbance.    Physical Exam: Filed Vitals:   01/02/14 0850  BP: 138/62  Pulse: 74   the general appearance reveals a well-developed elderly gentleman in no distress.  He walks with a severe limp.The head and neck exam reveals pupils equal and reactive.  Extraocular movements are full.  There is no scleral icterus.  The mouth and pharynx are normal.  The neck is supple.  The carotids reveal no bruits.  The jugular venous pressure is normal.  The  thyroid is not enlarged.  There is no lymphadenopathy.  The chest is clear to percussion and auscultation.  There are no rales or rhonchi.  Expansion of the chest is symmetrical.  The precordium is quiet.  The first heart sound is normal.  The second heart sound is  physiologically split.  There is no murmur gallop rub or click.  There is no abnormal lift or heave.  The abdomen is soft and nontender.  The bowel sounds are normal.  The liver and spleen are not enlarged.  There are no abdominal masses.  There are no abdominal bruits.  Extremities reveal good pedal pulses.  There is no phlebitis.  There is mild edema.  There is no cyanosis or clubbing.  Strength is normal and symmetrical in all extremities.  There is no lateralizing weakness.  Limited motion of right hip.  There are no sensory deficits.  The skin is warm and dry.  There is no rash.     Assessment / Plan: 1.  Ischemic heart disease status post old large anterior wall myocardial infarction with late presentation, on dual antiplatelet therapy. 2. chronic renal insufficiency stage IV.  His labs are being followed by his PCP and his nephrologist.  Most recent creatinine was 3.52, stable 3. essential hypertension without  heart failure. 4. ischemic cardiomyopathy with ejection fraction 45% by echocardiogram 02/27/13\ 5. right bundle branch block 6. dyslipidemia with statin intolerance 7. painful osteoarthritic right hip, further surgery not a possibility, followed by Dr. Wynelle Link  Plan: Continue current medication.  He does have weight gain of 9 pounds some of which is edema fluid and he will take an extra Lasix on a when necessary basis perhaps once a week.  Recheck in 4 months for office visit and EKG

## 2014-01-02 NOTE — Assessment & Plan Note (Signed)
Blood pressure is remaining stable on current therapy.  Serum creatinine is remaining stable at 3.52.

## 2014-01-02 NOTE — Patient Instructions (Signed)
Your physician recommends that you continue on your current medications as directed. Please refer to the Current Medication list given to you today.  Your physician wants you to follow-up in: [place] will receive a reminder letter in the mail two months in advance. If you don't receive a letter, please call our office to schedule the follow-up appointment.

## 2014-01-16 ENCOUNTER — Other Ambulatory Visit: Payer: Self-pay | Admitting: Cardiology

## 2014-01-25 ENCOUNTER — Encounter: Payer: Self-pay | Admitting: Cardiology

## 2014-01-26 ENCOUNTER — Telehealth: Payer: Self-pay | Admitting: *Deleted

## 2014-01-26 NOTE — Telephone Encounter (Signed)
Per my chart message patient had nose bleed yesterday and blood in urine yesterday afternoon Left message with wife to call back, she sent message

## 2014-01-26 NOTE — Telephone Encounter (Signed)
Spoke with wife and patient has not had any nose bleed or blood in urine since yesterday Wife did hold Plavix yesterday and today Discussed with  Dr. Mare Ferrari and will have patient resume Plavix tomorrow if no further bleeding Advised wife

## 2014-02-16 ENCOUNTER — Other Ambulatory Visit: Payer: Self-pay | Admitting: Cardiology

## 2014-02-16 NOTE — Telephone Encounter (Signed)
Rx has been sent to the pharmacy electronically. ° °

## 2014-03-07 ENCOUNTER — Other Ambulatory Visit: Payer: Self-pay | Admitting: Cardiology

## 2014-03-14 ENCOUNTER — Telehealth: Payer: Self-pay | Admitting: *Deleted

## 2014-03-14 MED ORDER — IPRATROPIUM BROMIDE 0.03 % NA SOLN: 2.0000 | Freq: Two times a day (BID) | NASAL | Status: DC

## 2014-03-14 NOTE — Telephone Encounter (Signed)
Wife in to see  Dr. Mare Ferrari and discussed patients rhinorrhea  Per  Dr. Mare Ferrari will Rx Atrovent nasal spray 2 sprays each nostril twice a day, rx sent to pharmacy

## 2014-04-10 ENCOUNTER — Other Ambulatory Visit: Payer: Self-pay | Admitting: Cardiology

## 2014-04-13 ENCOUNTER — Telehealth: Payer: Self-pay | Admitting: Cardiology

## 2014-04-13 MED ORDER — METOPROLOL TARTRATE 25 MG PO TABS
25.0000 mg | ORAL_TABLET | Freq: Two times a day (BID) | ORAL | Status: DC
Start: 2014-04-13 — End: 2015-02-05

## 2014-04-13 NOTE — Telephone Encounter (Signed)
Pt c/o medication issue:  1. Name of Medication: Metoprolol Tartrate 25 mg  2. How are you currently taking this medication (dosage and times per day)? 1 tab twice daily  3. Are you having a reaction (difficulty breathing--STAT)? No  4. What is your medication issue? Pharm has question regarding direction changes

## 2014-04-13 NOTE — Telephone Encounter (Signed)
Spoke with wife and patient has been taking Metoprolol 25 mg twice a day for quite some time Last office note states twice a day Per  Dr. Mare Ferrari will have patient continue twice a day Jonni Sanger and wife aware New Rx sent to pharmacy

## 2014-04-19 ENCOUNTER — Other Ambulatory Visit (HOSPITAL_COMMUNITY): Payer: Self-pay | Admitting: Nephrology

## 2014-04-19 ENCOUNTER — Ambulatory Visit (HOSPITAL_COMMUNITY)
Admission: RE | Admit: 2014-04-19 | Discharge: 2014-04-19 | Disposition: A | Discharge status: Home / Self Care | Payer: Medicare Other | Source: Ambulatory Visit | Attending: Nephrology | Admitting: Nephrology

## 2014-04-19 DIAGNOSIS — I129 Hypertensive chronic kidney disease with stage 1 through stage 4 chronic kidney disease, or unspecified chronic kidney disease: Secondary | ICD-10-CM | POA: Diagnosis not present

## 2014-04-19 DIAGNOSIS — N189 Chronic kidney disease, unspecified: Secondary | ICD-10-CM | POA: Insufficient documentation

## 2014-04-19 DIAGNOSIS — N281 Cyst of kidney, acquired: Secondary | ICD-10-CM | POA: Diagnosis not present

## 2014-04-19 DIAGNOSIS — R934 Abnormal findings on diagnostic imaging of urinary organs: Secondary | ICD-10-CM | POA: Diagnosis not present

## 2014-04-19 DIAGNOSIS — N183 Chronic kidney disease, stage 3 unspecified: Secondary | ICD-10-CM

## 2014-06-02 ENCOUNTER — Ambulatory Visit: Payer: Medicare Other | Admitting: Cardiology

## 2014-07-17 ENCOUNTER — Other Ambulatory Visit: Payer: Self-pay

## 2014-07-19 ENCOUNTER — Encounter: Payer: Self-pay | Admitting: Cardiology

## 2014-07-19 ENCOUNTER — Ambulatory Visit (INDEPENDENT_AMBULATORY_CARE_PROVIDER_SITE_OTHER): Payer: Medicare Other | Admitting: Cardiology

## 2014-07-19 VITALS — BP 140/70 | HR 65 | Ht 63.0 in | Wt 173.8 lb

## 2014-07-19 DIAGNOSIS — N184 Chronic kidney disease, stage 4 (severe): Secondary | ICD-10-CM

## 2014-07-19 DIAGNOSIS — I4510 Unspecified right bundle-branch block: Secondary | ICD-10-CM

## 2014-07-19 DIAGNOSIS — I255 Ischemic cardiomyopathy: Secondary | ICD-10-CM | POA: Diagnosis not present

## 2014-07-19 DIAGNOSIS — I119 Hypertensive heart disease without heart failure: Secondary | ICD-10-CM | POA: Diagnosis not present

## 2014-07-19 MED ORDER — FUROSEMIDE 40 MG PO TABS: 40.0000 mg | ORAL_TABLET | Freq: Every day | ORAL | Status: DC

## 2014-07-19 NOTE — Progress Notes (Signed)
Cardiology Office Note   Date:  07/19/2014   ID:  [name], DOB October 26, 1928, MRN [id number]  PCP:  Matthew Cahill, MD  Cardiologist: Matthew Coco MD  No chief complaint on file.     History of Present Illness: [name] is a 81 y.o. male who presents for a four-month follow-up office visit  This pleasant 81 year old gentleman is seen for a followup office visit. He has a past history of the large anterior wall myocardial infarction with late presentation. That was about 5 years ago. The patient has a history of high blood pressure and chronic kidney disease. His last ejection fraction by echo in 02/27/13 showed an ejection fraction 40-45% with wall motion abnormalities.. His last nuclear stress test in March 2011 showed a large area of infarct with only minimal peri-infarct ischemia and an ejection fraction of 27%. He is managed medically. He has not been experiencing any chest pain but has had some exertional dyspnea which is chronic. He recently developed worsening peripheral edema after we increased his amlodipine from 5 mg up to 10 mg daily. His edema has improved since his PCP Dr. Delphina [name] switched him from amlodipine to hydralazine.  The patient has a history of occasional gout . He has not had any recent episodes of gout. The patient remains on renal dose allopurinol. He has chronic renal disease and is followed by a nephrologist, Dr.Befekadu, in [place]. He has severe problems with his right hip. He has had a previous reconstruction right hip socket which is now deteriorating. His orthopedist as indicated no further surgery is possible. The patient is walking with a walker. The patient has significant problems with urination. He has frequency and urgency. Dr. Lawerance [name] is his urologist. The patient does have nocturia 3-4 times a night. Since last visit he has been reasonably stable.  Several days ago his edema worsened and his wife has increased his Lasix  appropriately.  His most recent creatinine was 3.24, down from 3.65.  Past Medical History  Diagnosis Date  . Hypertension   . Gout   . IHD (ischemic heart disease)     Remote MI in 2009 with late presentation; no reperfusion. Has large area of infarct in the apical anterior area on last nuclear in 2011. Managed medically  . Chronic renal insufficiency     creatinine ranges around 2.8  . OA (osteoarthritis) of knee     Left knee with revision and replacement of prosthetic L TKR  . 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 DOE (dyspnea on exertion)   . Herpes zoster 12/2009  . Peripheral edema   . RBBB (right bundle branch block)   . History of BPH   . Cancer   . Renal insufficiency   . Asthma     Past Surgical History  Procedure Laterality Date  . Cardiac catheterization  03/24/2007  . Total knee arthroplasty      left knee  . Cardiovascular stress test  04/02/2009    EF 27% AND A LARGE AREA OF INFARCT IN THE APICAL ANTERIOR WITH MILD PERI-INFARCT ISCHEMIA  . Transthoracic echocardiogram  04/04/2009    EF 45-50%  . Knee surgery  1998    left knee     Current Outpatient Prescriptions  Medication Sig Dispense Refill  . allopurinol (ZYLOPRIM) 100 MG tablet Take 50 mg by mouth daily.    . clopidogrel (PLAVIX) 75 MG tablet Take 75 mg by mouth daily.    Marland Kitchen  isosorbide mononitrate (IMDUR) 30 MG 24 hr tablet Take 30 mg by mouth daily.    . nitroGLYCERIN (NITROSTAT) 0.4 MG SL tablet Place 0.4 mg under the tongue every 5 (five) minutes as needed for chest pain (chest pain).    Marland Kitchen acetaminophen (TYLENOL) 325 MG tablet Take 2 tablets (650 mg total) by mouth every 4 (four) hours as needed for headache or mild pain.    Marland Kitchen albuterol (PROVENTIL HFA;VENTOLIN HFA) 108 (90 BASE) MCG/ACT inhaler Inhale 2 puffs into the lungs every 6 (six) hours as needed for wheezing or shortness of breath.    Marland Kitchen aspirin 81 MG tablet Take 81 mg by mouth every other day.      . furosemide (LASIX) 40 MG tablet Take 1  tablet (40 mg total) by mouth daily. 90 tablet 3  . hydrALAZINE (APRESOLINE) 50 MG tablet Take 1.5 tablets (75 mg total) by mouth 3 (three) times daily. 100 tablet 11  . ipratropium (ATROVENT) 0.03 % nasal spray Place 2 sprays into both nostrils every 12 (twelve) hours. 30 mL 12  . metoprolol tartrate (LOPRESSOR) 25 MG tablet Take 1 tablet (25 mg total) by mouth 2 (two) times daily. 60 tablet 11   No current facility-administered medications for this visit.    Allergies:   Crestor; Gentamycin; Hctz; and Lipitor    Social History:  The patient  reports that he has never smoked. He has never used smokeless tobacco. He reports that he does not drink alcohol or use illicit drugs.   Family History:  The patient's family history includes Coronary artery disease in his mother; Emphysema in his father.    ROS:  Please see the history of present illness.   Otherwise, review of systems are positive for none.   All other systems are reviewed and negative.    PHYSICAL EXAM: VS:  BP 140/70 mmHg  Pulse 65  Ht 5\' 3"  (1.6 m)  Wt 173 lb 12.8 oz (78.835 kg)  BMI 30.79 kg/m2 , BMI Body mass index is 30.79 kg/(m^2). GEN: Well nourished, well developed, in no acute distress HEENT: normal Neck: no JVD, carotid bruits, or masses Cardiac: RRR; no murmurs, rubs, or gallops,no edema  Respiratory:  clear to auscultation bilaterally, normal work of breathing GI: soft, nontender, nondistended, + BS MS: no deformity or atrophy Skin: warm and dry, no rash Neuro:  Strength and sensation are intact Psych: euthymic mood, full affect   EKG:  EKG is ordered today. The ekg ordered today demonstrates normal sinus rhythm with bifascicular block and LVH with secondary ST-T wave changes.  Since last tracing of 03/02/13, heart rate is slower   Recent Labs: 08/31/2013: ALT 16; BUN 68*; Creatinine, Ser 3.6*; Potassium 4.0; Sodium 141    Lipid Panel    Component Value Date/Time   CHOL 142 08/31/2013 0944   TRIG  42.0 08/31/2013 0944   HDL 39.40 08/31/2013 0944   CHOLHDL 4 08/31/2013 0944   VLDL 8.4 08/31/2013 0944   LDLCALC 94 08/31/2013 0944      Wt Readings from Last 3 Encounters:  07/19/14 173 lb 12.8 oz (78.835 kg)  01/02/14 171 lb (77.565 kg)  08/31/13 162 lb (73.483 kg)      Other studies Reviewed: Additional studies/ records that were reviewed today include: . Review of the above records demonstrates:    ASSESSMENT AND PLAN:  1. Ischemic heart disease status post old large anterior wall myocardial infarction with late presentation, on dual antiplatelet therapy. 2. chronic renal insufficiency stage  IV. His labs are being followed by his PCP and his nephrologist. Most recent creatinine was 3.24, stable 3. essential hypertension without heart failure. 4. ischemic cardiomyopathy with ejection fraction 45% by echocardiogram 02/27/13\ 5. right bundle branch block 6. dyslipidemia with statin intolerance 7. painful osteoarthritic right hip, further surgery not a possibility, followed by Dr. Wynelle Link   Current medicines are reviewed at length with the patient today.  The patient does not have concerns regarding medicines.  The following changes have been made:  no change  Labs/ tests ordered today include:   Orders Placed This Encounter  Procedures  . EKG 12-Lead     The patient received Lasix 60 mg yesterday.  He will get 60 today and then continue on Lasix 40 mg daily starting tomorrow.  He will get a follow-up basal metabolic panel at his PCP office in about one week.  Recheck here 4 months  Signed, Matthew Coco MD 07/19/2014 7:19 PM    [facility] [place], [place], [place]  [address] Phone: [phone]; Fax: [phone]

## 2014-07-19 NOTE — Patient Instructions (Addendum)
Medication Instructions:  CONTINUE LASIX 40 MG DAILY, NEW RX FOR 40 MG TABLETS SENT TO PHARMACY   Labwork: NONE  Testing/Procedures: NONE  Follow-Up: Your physician wants you to follow-up in: [place] will receive a reminder letter in the mail two months in advance. If you don't receive a letter, please call our office to schedule the follow-up appointment.

## 2014-07-26 ENCOUNTER — Other Ambulatory Visit: Payer: Self-pay | Admitting: Cardiology

## 2014-11-24 ENCOUNTER — Encounter: Payer: Self-pay | Admitting: Cardiology

## 2014-11-24 ENCOUNTER — Ambulatory Visit (INDEPENDENT_AMBULATORY_CARE_PROVIDER_SITE_OTHER): Payer: Medicare Other | Admitting: Cardiology

## 2014-11-24 VITALS — BP 120/60 | HR 73 | Ht 63.0 in | Wt 178.4 lb

## 2014-11-24 DIAGNOSIS — I4510 Unspecified right bundle-branch block: Secondary | ICD-10-CM

## 2014-11-24 DIAGNOSIS — I255 Ischemic cardiomyopathy: Secondary | ICD-10-CM | POA: Diagnosis not present

## 2014-11-24 DIAGNOSIS — I119 Hypertensive heart disease without heart failure: Secondary | ICD-10-CM | POA: Diagnosis not present

## 2014-11-24 DIAGNOSIS — N184 Chronic kidney disease, stage 4 (severe): Secondary | ICD-10-CM | POA: Diagnosis not present

## 2014-11-24 NOTE — Progress Notes (Signed)
Cardiology Office Note   Date:  11/24/2014   ID:  [name], DOB October 23, 1928, MRN [id number]  PCP:  Wende Neighbors, MD  Cardiologist: Darlin Coco MD  Chief Complaint  Patient presents with  . Coronary Artery Disease      History of Present Illness: [name] is a 81 y.o. male who presents for scheduled four-month follow-up visit  This pleasant 81 year old gentleman is seen for a followup office visit. He has a remote history of the large anterior wall myocardial infarction with late presentation.  The patient has a history of high blood pressure and chronic kidney disease. His last ejection fraction by echo in 02/27/13 showed an ejection fraction 40-45% with wall motion abnormalities.. His last nuclear stress test in March 2011 showed a large area of infarct with only minimal peri-infarct ischemia and an ejection fraction of 27%. He is managed medically. He has not been experiencing any chest pain but has had some exertional dyspnea which is chronic. He recently developed worsening peripheral edema after we increased his amlodipine from 5 mg up to 10 mg daily. His edema has improved since his PCP Dr. Delphina Cahill switched him from amlodipine to hydralazine.  The patient has a history of occasional gout . He has not had any recent episodes of gout. The patient remains on renal dose allopurinol. He has chronic renal disease and is followed by a nephrologist, Dr.Befekadu, in [place]. He has severe problems with his right hip. He has had a previous reconstruction right hip socket which is now deteriorating. His orthopedist as indicated no further surgery is possible. The patient is walking with a walker. Since last visit his serum creatinine has increased to 4.1 and his PCP has reduced his Lasix from 40 mg daily down to 20 mg daily.  Past Medical History  Diagnosis Date  . Hypertension   . Gout   . IHD (ischemic heart disease)     Remote MI in 2009 with late presentation;  no reperfusion. Has large area of infarct in the apical anterior area on last nuclear in 2011. Managed medically  . Chronic renal insufficiency     creatinine ranges around 2.8  . OA (osteoarthritis) of knee     Left knee with revision and replacement of prosthetic L TKR  . 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 DOE (dyspnea on exertion)   . Herpes zoster 12/2009  . Peripheral edema   . RBBB (right bundle branch block)   . History of BPH   . Cancer ([place])   . Renal insufficiency   . Asthma     Past Surgical History  Procedure Laterality Date  . Cardiac catheterization  03/24/2007  . Total knee arthroplasty      left knee  . Cardiovascular stress test  04/02/2009    EF 27% AND A LARGE AREA OF INFARCT IN THE APICAL ANTERIOR WITH MILD PERI-INFARCT ISCHEMIA  . Transthoracic echocardiogram  04/04/2009    EF 45-50%  . Knee surgery  1998    left knee     Current Outpatient Prescriptions  Medication Sig Dispense Refill  . acetaminophen (TYLENOL) 325 MG tablet Take 2 tablets (650 mg total) by mouth every 4 (four) hours as needed for headache or mild pain.    Marland Kitchen albuterol (PROVENTIL HFA;VENTOLIN HFA) 108 (90 BASE) MCG/ACT inhaler Inhale 2 puffs into the lungs every 6 (six) hours as needed for wheezing or shortness of breath.    . allopurinol (  ZYLOPRIM) 100 MG tablet Take 50 mg by mouth daily.    Marland Kitchen aspirin 81 MG tablet Take 81 mg by mouth every other day.      . clopidogrel (PLAVIX) 75 MG tablet Take 75 mg by mouth daily.    . furosemide (LASIX) 40 MG tablet Take 20 mg by mouth daily.    Marland Kitchen gabapentin (NEURONTIN) 100 MG capsule Take 100 mg by mouth at bedtime.    . hydrALAZINE (APRESOLINE) 50 MG tablet Take 1.5 tablets (75 mg total) by mouth 3 (three) times daily. 100 tablet 11  . ipratropium (ATROVENT) 0.03 % nasal spray Place 2 sprays into both nostrils every 12 (twelve) hours. 30 mL 12  . isosorbide mononitrate (IMDUR) 30 MG 24 hr tablet Take 30 mg by mouth daily.    . metoprolol  tartrate (LOPRESSOR) 25 MG tablet Take 1 tablet (25 mg total) by mouth 2 (two) times daily. 60 tablet 11  . nitroGLYCERIN (NITROSTAT) 0.4 MG SL tablet Place 0.4 mg under the tongue every 5 (five) minutes as needed for chest pain (chest pain).    . triamcinolone ointment (KENALOG) 0.1 % Apply 1 application topically daily as needed. (Itchy skin)     No current facility-administered medications for this visit.    Allergies:   Crestor; Gentamycin; Hctz; and Lipitor    Social History:  The patient  reports that he has never smoked. He has never used smokeless tobacco. He reports that he does not drink alcohol or use illicit drugs.   Family History:  The patient's family history includes Coronary artery disease in his mother; Emphysema in his father.    ROS:  Please see the history of present illness.   Otherwise, review of systems are positive for none.   All other systems are reviewed and negative.    PHYSICAL EXAM: VS:  BP 120/60 mmHg  Pulse 73  Ht 5\' 3"  (1.6 m)  Wt 178 lb 6.4 oz (80.922 kg)  BMI 31.61 kg/m2 , BMI Body mass index is 31.61 kg/(m^2). GEN: Well nourished, well developed, in no acute distress HEENT: normal Neck: no JVD, carotid bruits, or masses Cardiac: RRR; there is a grade 2/6 systolic ejection murmur at the base., rubs, or gallops, there is 1+ edema  Respiratory:  clear to auscultation bilaterally, normal work of breathing GI: soft, nontender, nondistended, + BS MS: no deformity or atrophy Skin: warm and dry, no rash Neuro:  Strength and sensation are intact Psych: euthymic mood, full affect   EKG:  EKG is not ordered today.    Recent Labs: No results found for requested labs within last 365 days.    Lipid Panel    Component Value Date/Time   CHOL 142 08/31/2013 0944   TRIG 42.0 08/31/2013 0944   HDL 39.40 08/31/2013 0944   CHOLHDL 4 08/31/2013 0944   VLDL 8.4 08/31/2013 0944   LDLCALC 94 08/31/2013 0944      Wt Readings from Last 3 Encounters:    11/24/14 178 lb 6.4 oz (80.922 kg)  07/19/14 173 lb 12.8 oz (78.835 kg)  01/02/14 171 lb (77.565 kg)        ASSESSMENT AND PLAN:  1. Ischemic heart disease status post old large anterior wall myocardial infarction with late presentation, on dual antiplatelet therapy. 2. chronic renal insufficiency stage IV. His labs are being followed by his PCP and his nephrologist. Most recent creatinine was 4.1 3. essential hypertension without heart failure. 4. ischemic cardiomyopathy with ejection fraction 45% by echocardiogram  02/27/13 5. right bundle branch block 6. dyslipidemia with statin intolerance 7. painful osteoarthritic right hip, further surgery not a possibility, followed by Dr. Wynelle Link   Current medicines are reviewed at length with the patient today.  The patient does not have concerns regarding medicines.  The following changes have been made:  no change  Labs/ tests ordered today include:  No orders of the defined types were placed in this encounter.     Disposition: Continue current medication.  Recheck in 4 months for office visit with Dr. Martinique  Signed, Darlin Coco MD 11/24/2014 10:56 AM    [address], [place], [place]  [address] Phone: [phone]; Fax: [phone]

## 2014-11-24 NOTE — Patient Instructions (Signed)
Medication Instructions:  Your physician recommends that you continue on your current medications as directed. Please refer to the Current Medication list given to you today.  Labwork: NONE  Testing/Procedures: NONE  Follow-Up: Your physician recommends that you schedule a follow-up appointment in: 4 MONTH OV WITH DR Martinique   If you need a refill on your cardiac medications before your next appointment, please call your pharmacy.

## 2015-01-16 ENCOUNTER — Telehealth: Payer: Self-pay | Admitting: Cardiology

## 2015-01-16 NOTE — Telephone Encounter (Signed)
New Message   Pt wife is calling  to speak to rn about increasing the lasix

## 2015-01-16 NOTE — Telephone Encounter (Signed)
Spoke with wife and patient saw his PCP over a week ago His WBC was elevated so he was given Rx for Levaquin He finished but continues to have cough, yesterday had wheezing Wife did give 60 mg of Lasix yesterday and 40 mg today Wife concerned and thinks he may need chest Xray Advised to call PCP and see if he could get Xray or appointment since  Dr. Mare Ferrari out of office until mid next week Followed up with wife and patient has appointment Friday for follow up with PCP  Advised to call back after visit if feels he needs follow up with cardiology

## 2015-01-19 ENCOUNTER — Other Ambulatory Visit: Payer: Self-pay | Admitting: Cardiology

## 2015-01-23 ENCOUNTER — Other Ambulatory Visit (HOSPITAL_COMMUNITY): Payer: Self-pay | Admitting: Internal Medicine

## 2015-01-23 ENCOUNTER — Ambulatory Visit (HOSPITAL_COMMUNITY)
Admission: RE | Admit: 2015-01-23 | Discharge: 2015-01-23 | Disposition: A | Discharge status: Home / Self Care | Payer: Commercial Managed Care - HMO | Source: Ambulatory Visit | Attending: Internal Medicine | Admitting: Internal Medicine

## 2015-01-23 DIAGNOSIS — R05 Cough: Secondary | ICD-10-CM | POA: Insufficient documentation

## 2015-01-23 DIAGNOSIS — R918 Other nonspecific abnormal finding of lung field: Secondary | ICD-10-CM

## 2015-01-23 DIAGNOSIS — R0602 Shortness of breath: Secondary | ICD-10-CM | POA: Diagnosis not present

## 2015-01-23 DIAGNOSIS — R059 Cough, unspecified: Secondary | ICD-10-CM

## 2015-01-25 ENCOUNTER — Ambulatory Visit (HOSPITAL_COMMUNITY)
Admission: RE | Admit: 2015-01-25 | Discharge: 2015-01-25 | Disposition: A | Discharge status: Home / Self Care | Payer: Commercial Managed Care - HMO | Source: Ambulatory Visit | Attending: Internal Medicine | Admitting: Internal Medicine

## 2015-01-25 DIAGNOSIS — R918 Other nonspecific abnormal finding of lung field: Secondary | ICD-10-CM

## 2015-01-25 DIAGNOSIS — R911 Solitary pulmonary nodule: Secondary | ICD-10-CM | POA: Insufficient documentation

## 2015-02-05 ENCOUNTER — Ambulatory Visit (INDEPENDENT_AMBULATORY_CARE_PROVIDER_SITE_OTHER): Payer: Commercial Managed Care - HMO | Admitting: Internal Medicine

## 2015-02-05 ENCOUNTER — Encounter: Payer: Self-pay | Admitting: Internal Medicine

## 2015-02-05 ENCOUNTER — Other Ambulatory Visit: Payer: Self-pay | Admitting: Cardiology

## 2015-02-05 VITALS — BP 162/90 | HR 75 | Ht 63.0 in | Wt 184.0 lb

## 2015-02-05 DIAGNOSIS — I10 Essential (primary) hypertension: Secondary | ICD-10-CM

## 2015-02-05 DIAGNOSIS — J45991 Cough variant asthma: Secondary | ICD-10-CM

## 2015-02-05 DIAGNOSIS — J4540 Moderate persistent asthma, uncomplicated: Secondary | ICD-10-CM | POA: Insufficient documentation

## 2015-02-05 MED ORDER — NEBIVOLOL HCL 5 MG PO TABS: 5.0000 mg | ORAL_TABLET | Freq: Every day | ORAL | Status: DC

## 2015-02-05 MED ORDER — AMOXICILLIN-POT CLAVULANATE 875-125 MG PO TABS: 1.0000 | ORAL_TABLET | Freq: Two times a day (BID) | ORAL | Status: DC

## 2015-02-05 MED ORDER — PREDNISONE 10 MG PO TABS: ORAL_TABLET | ORAL | Status: DC

## 2015-02-05 NOTE — Patient Instructions (Addendum)
Stop lopressor and start bystolic 5 mg each am   For cough > mucinex dm up to 1200 mg every 12hours  Prednisone 10 mg take  4 each am x 2 days,   2 each am x 2 days,  1 each am x 2 days and stop   Augmentin 875 mg take one pill twice daily  X 10 days - take at breakfast and supper with large glass of water.  It would help reduce the usual side effects (diarrhea and yeast infections) if you ate cultured yogurt at lunch.   Try prilosec otc 20mg   Take 30-60 min before first meal of the day and Pepcid ac (famotidine) 20 mg one @  bedtime until cough is completely gone for at least a week without the need for cough suppression     GERD (REFLUX)  is an extremely common cause of respiratory symptoms just like yours , many times with no obvious heartburn at all.    It can be treated with medication, but also with lifestyle changes including elevation of the head of your bed (ideally with 6 inch  bed blocks),  Smoking cessation, avoidance of late meals, excessive alcohol, and avoid fatty foods, chocolate, peppermint, colas, red wine, and acidic juices such as orange juice.  NO MINT OR MENTHOL PRODUCTS SO NO COUGH DROPS  USE SUGARLESS CANDY INSTEAD (Jolley ranchers or Stover's or Life Savers) or even ice chips will also do - the key is to swallow to prevent all throat clearing. NO OIL BASED VITAMINS - use powdered substitutes.    Please schedule a follow up office visit in 4 weeks with cxr

## 2015-02-05 NOTE — Progress Notes (Signed)
Subjective:    Patient ID: [name], male    DOB: March 01, 1928,     MRN: [id number]  HPI   86 yowm never smoker with cough recurrent x 2011 with another flare in Nov 2016 > abn cxr > CT ? Post obst atx > referred to pulmonary clinic 02/05/2015 by Dr Delphina Cahill.    02/05/2015 1st [facility] office visit/ Catelyn Friel   Chief Complaint  Patient presents with  . Pulmonary Consult    Referred by Dr. Wende Neighbors for eval of abnormal ct chest. Pt c/o cough and increased SOB for the past month. Cough is prod with foamy sputum. He has also noticed some wheezing and chest tightness.   sinus drainage x years/ worse p eating / recurrent cough with prev w/u here:  - Allergy profile 08/06/2012 > No eos, IgE 130 > POS cats and dogs - sinus ct 08/10/2012 > Minimal mucosal thickening of the maxillary sinuses without the airfluid levels. - trial of singulair and 1st gen h1 at hs started 08/20/2012 > stopped 10/25/2012 as no better  - MCT 10/11/12 > Pos at 1.0 but no cough  - hfa 75% 10/25/2012 > trial of dulera 100 and off lopressor - resolved 12/06/12 p elevation of HOB and off all inhalers  Recurrent severe coughing esp in am x Nov 2016 indolent onset/ persistent daily > dark yellow mucus plugs some better on levaquin Still has dog in bedroom  Doe = MMRC2 = can't walk a nl pace on a flat grade s sob Cough and breathing some better with neb alb/atrovent    No obvious other patterns in day to day or daytime variabilty or assoc  cp or chest tightness,  Or  hb symptoms. No unusual exp hx or h/o childhood pna/ asthma or knowledge of premature birth.  Sleeping ok without nocturnal   exacerbations  of respiratory  c/o's or need for noct saba. Also denies any obvious fluctuation of symptoms with weather or environmental changes or other aggravating or alleviating factors except as outlined above   Current Medications, Allergies, Complete Past Medical History, Past Surgical History, Family History, and  Social History were reviewed in [employer] record.     Review of Systems  Constitutional: Negative for fever, chills, activity change, appetite change and unexpected weight change.  HENT: Negative for congestion, dental problem, postnasal drip, rhinorrhea, sneezing, sore throat, trouble swallowing and voice change.   Eyes: Negative for visual disturbance.  Respiratory: Positive for cough and shortness of breath. Negative for choking.   Cardiovascular: Negative for chest pain and leg swelling.  Gastrointestinal: Negative for nausea, vomiting and abdominal pain.  Genitourinary: Negative for difficulty urinating.  Musculoskeletal: Negative for arthralgias.  Skin: Negative for rash.  Psychiatric/Behavioral: Negative for behavioral problems and confusion.       Objective:   Physical Exam  amb wm nad/ nasal tone to voice   Wt Readings from Last 3 Encounters:  02/05/15 184 lb (83.462 kg)  11/24/14 178 lb 6.4 oz (80.922 kg)  07/19/14 173 lb 12.8 oz (78.835 kg)    Vital signs reviewed   HEENT: nl dentition, turbinates, and oropharynx. Nl external ear canals without cough reflex   NECK :  without JVD/Nodes/TM/ nl carotid upstrokes bilaterally   LUNGS: no acc muscle use,  Nl contour chest which is clear to A and P bilaterally without cough on insp or exp maneuvers   CV:  RRR  no s3 or murmur or increase in  P2,  1+ bilateral  lower ext  edema   ABD:  soft and nontender with nl inspiratory excursion in the supine position. No bruits or organomegaly, bowel sounds nl  MS:  Nl gait/ ext warm without deformities, calf tenderness, cyanosis or clubbing No obvious joint restrictions   SKIN: warm and dry without lesions    NEURO:  alert, approp, nl sensorium with  no motor deficits       I personally reviewed images and agree with radiology impression as follows:  CT Chest   01/25/15  Minimal right basilar subsegmental atelectasis or scarring. Mixed linear  and solid density is noted laterally in right midlung which may be slightly enlarged compared to prior exam; CT scan of the chest is recommended for further evaluation to rule out possible mass or neoplasm      Assessment & Plan:

## 2015-02-05 NOTE — Assessment & Plan Note (Addendum)
-   Allergy profile 08/06/2012 > No eos, IgE 130 > POS cats and dogs - sinus ct 08/10/2012 > Minimal mucosal thickening of the maxillary sinuses without the airfluid levels. - trial of singulair and 1st gen h1 at hs started 08/20/2012 > stopped 10/25/2012 as no better  - MCT 10/11/12 > Pos at 1.0 but no cough  - hfa 75% 10/25/2012 > trial of dulera 100 and off lopressor - resolved 12/06/12 p elevation of HOB and off all inhalers - recurrent cough Nov 2016 > trial off lopressor / on gerd rx and 10 days augmentin/ short course pred   The most common causes of chronic cough in immunocompetent adults include the following: upper airway cough syndrome (UACS), previously referred to as postnasal drip syndrome (PNDS), which is caused by variety of rhinosinus conditions; (2) asthma; (3) GERD; (4) chronic bronchitis from cigarette smoking or other inhaled environmental irritants; (5) nonasthmatic eosinophilic bronchitis; and (6) bronchiectasis.   These conditions, singly or in combination, have accounted for up to 94% of the causes of chronic cough in prospective studies.   Other conditions have constituted no >6% of the causes in prospective studies These have included bronchogenic carcinoma, chronic interstitial pneumonia, sarcoidosis, left ventricular failure, ACEI-induced cough, and aspiration from a condition associated with pharyngeal dysfunction.    Chronic cough is often simultaneously caused by more than one condition. A single cause has been found from 38 to 82% of the time, multiple causes from 18 to 62%. Multiply caused cough has been the result of three diseases up to 42% of the time.       1) clearly has underlying asthma and allergy to dog which is still in bedroom, discussed avoidance  2) may be very sensitive to effects of sinus dz/ pnds and gerd/ cyclical coughing in this setting (the more he coughs, the more he refluxes and causes more coughing)   Rec: see instructions  Total time  devoted to counseling  = 32m/60 consultation/ review case with pt/wife/ daugher  discussion of options/alternatives/ giving and going over instructions (see avs)       Each maintenance medication was reviewed in detail including most importantly the difference between maintenance and as needed and under what circumstances the prns are to be used.  Please see instructions for details which were reviewed in writing and the patient given a copy.

## 2015-02-06 ENCOUNTER — Encounter: Payer: Self-pay | Admitting: Internal Medicine

## 2015-02-06 DIAGNOSIS — I10 Essential (primary) hypertension: Secondary | ICD-10-CM | POA: Insufficient documentation

## 2015-02-06 NOTE — Assessment & Plan Note (Signed)
Trial off lopressor and on bystolic XX123456 due to ? Asthma   Strongly prefer in this setting(non-specific resp complaints) : Bystolic, the most beta -1  selective Beta blocker available in sample form, with bisoprolol the most selective generic choice  on the market.   F/u in 4 weeks to regroup and consider further w/u for non-specific resp symptoms if not better on the most selective bb option  rx bystolic 5 mg daily samples

## 2015-02-12 ENCOUNTER — Telehealth: Payer: Self-pay | Admitting: Internal Medicine

## 2015-02-12 MED ORDER — CEFDINIR 300 MG PO CAPS: 300.0000 mg | ORAL_CAPSULE | Freq: Two times a day (BID) | ORAL | Status: DC

## 2015-02-12 NOTE — Telephone Encounter (Signed)
Per 02/05/15 OV: Patient Instructions       Stop lopressor and start bystolic 5 mg each am   For cough > mucinex dm up to 1200 mg every 12hours  Prednisone 10 mg take  4 each am x 2 days,   2 each am x 2 days,  1 each am x 2 days and stop   Augmentin 875 mg take one pill twice daily  X 10 days - take at breakfast and supper with large glass of water.  It would help reduce the usual side effects (diarrhea and yeast infections) if you ate cultured yogurt at lunch.   Try prilosec otc 20mg   Take 30-60 min before first meal of the day and Pepcid ac (famotidine) 20 mg one @  bedtime until cough is completely gone for at least a week without the need for cough suppression      GERD (REFLUX)  is an extremely common cause of respiratory symptoms just like yours , many times with no obvious heartburn at all.    It can be treated with medication, but also with lifestyle changes including elevation of the head of your bed (ideally with 6 inch  bed blocks),  Smoking cessation, avoidance of late meals, excessive alcohol, and avoid fatty foods, chocolate, peppermint, colas, red wine, and acidic juices such as orange juice.   NO MINT OR MENTHOL PRODUCTS SO NO COUGH DROPS  USE SUGARLESS CANDY INSTEAD (Jolley ranchers or Stover's or Life Savers) or even ice chips will also do - the key is to swallow to prevent all throat clearing. NO OIL BASED VITAMINS - use powdered substitutes.    Please schedule a follow up office visit in 4 weeks with cxr      I called spoke with pt spouse. She reports pt is currently on augmentin. She reports since he has started this he has had watery diarrhea. Wants to change to a diff medication. Please advise MW thanks

## 2015-02-12 NOTE — Telephone Encounter (Signed)
omnicef 300 mg twice daily x 7 days/ probiotic may help plus avoid dairy products - yogurt only

## 2015-02-12 NOTE — Telephone Encounter (Signed)
Called spoke with pt spouse. Aware of recs. RX sent in. Nothing further needed

## 2015-02-20 ENCOUNTER — Institutional Professional Consult (permissible substitution): Payer: Commercial Managed Care - HMO | Admitting: Internal Medicine

## 2015-03-05 ENCOUNTER — Ambulatory Visit: Payer: Commercial Managed Care - HMO | Admitting: Internal Medicine

## 2015-03-07 ENCOUNTER — Ambulatory Visit: Payer: Commercial Managed Care - HMO | Admitting: Internal Medicine

## 2015-03-07 ENCOUNTER — Other Ambulatory Visit: Payer: Self-pay | Admitting: Cardiology

## 2015-03-08 ENCOUNTER — Ambulatory Visit (INDEPENDENT_AMBULATORY_CARE_PROVIDER_SITE_OTHER)
Admission: RE | Admit: 2015-03-08 | Discharge: 2015-03-08 | Disposition: A | Discharge status: Home / Self Care | Payer: Commercial Managed Care - HMO | Source: Ambulatory Visit | Attending: Internal Medicine | Admitting: Internal Medicine

## 2015-03-08 ENCOUNTER — Encounter: Payer: Self-pay | Admitting: Internal Medicine

## 2015-03-08 ENCOUNTER — Ambulatory Visit (INDEPENDENT_AMBULATORY_CARE_PROVIDER_SITE_OTHER): Payer: Commercial Managed Care - HMO | Admitting: Internal Medicine

## 2015-03-08 VITALS — BP 118/60 | HR 55 | Ht 64.0 in | Wt 181.0 lb

## 2015-03-08 DIAGNOSIS — J45991 Cough variant asthma: Secondary | ICD-10-CM

## 2015-03-08 DIAGNOSIS — I10 Essential (primary) hypertension: Secondary | ICD-10-CM | POA: Diagnosis not present

## 2015-03-08 MED ORDER — MOMETASONE FURO-FORMOTEROL FUM 100-5 MCG/ACT IN AERO
INHALATION_SPRAY | RESPIRATORY_TRACT | Status: DC
Start: 2015-03-08 — End: 2015-05-07

## 2015-03-08 NOTE — Progress Notes (Signed)
Quick Note:  Spoke with pt and notified of results per Dr. Wert. Pt verbalized understanding and denied any questions.  ______ 

## 2015-03-08 NOTE — Assessment & Plan Note (Signed)
Trial off lopressor and on bystolic [id number] due to ? Asthma > not better 03/08/2015 so ok to resume lopressor  ideally   prefer in this setting: Bystolic, the most beta -1  selective Beta blocker available in sample form, with bisoprolol the most selective generic choice  on the market but will defer this to Dr [place] Crane

## 2015-03-08 NOTE — Assessment & Plan Note (Signed)
-   Allergy profile 08/06/2012 > No eos, IgE 130 > POS cats and dogs - sinus ct 08/10/2012 > Minimal mucosal thickening of the maxillary sinuses without the airfluid levels. - trial of singulair and 1st gen h1 at hs started 08/20/2012 > stopped 10/25/2012 as no better  - MCT 10/11/12 > Pos at 1.0 but no cough  - hfa 75% 10/25/2012 > trial of dulera 100 and off lopressor - resolved 12/06/12 p elevation of HOB and off all inhalers - recurrent cough Nov 2016 > trial off lopressor / on gerd rx and 10 days augmentin/ short course pred > "no better" - Spirometry 03/08/2015  FEV1 0.90 (36%)  Ratio 52  - 03/08/2015  extensive coaching HFA effectiveness =    75% restart dulera 100 2bid   He clearly has asthma with cough also related to uncontrolled pnds and could have secondary GERD as well.   Since not convinced improved p addressing ? Sinus dz/ gerd/ BB effects needs to start back on dulera 100 2bid as before and f/u in 6 weeks with pfts  I had an extended discussion with the patient reviewing all relevant studies completed to date and  lasting 15 to 20 minutes of a 25 minute visit    Each maintenance medication was reviewed in detail including most importantly the difference between maintenance and prns and under what circumstances the prns are to be triggered using an action plan format that is not reflected in the computer generated alphabetically organized AVS.    Please see instructions for details which were reviewed in writing and the patient given a copy highlighting the part that I personally wrote and discussed at today's ov.

## 2015-03-08 NOTE — Progress Notes (Signed)
Subjective:    Patient ID: [name], male    DOB: 01/22/1928,     MRN: [id number]  HPI   57 yowm never smoker with cough recurrent x 2011 with another flare in Nov 2016 > abn cxr > CT ? Post obst atx > referred to pulmonary clinic 02/05/2015 by Dr Delphina Cahill.    02/05/2015 1st [facility] office visit/ Terrie Haring   Chief Complaint  Patient presents with  . Pulmonary Consult    Referred by Dr. Wende Neighbors for eval of abnormal ct chest. Pt c/o cough and increased SOB for the past month. Cough is prod with foamy sputum. He has also noticed some wheezing and chest tightness.   sinus drainage x years/ worse p eating / recurrent cough with prev w/u here:  - Allergy profile 08/06/2012 > No eos, IgE 130 > POS cats and dogs - sinus ct 08/10/2012 > Minimal mucosal thickening of the maxillary sinuses without the airfluid levels. - trial of singulair and 1st gen h1 at hs started 08/20/2012 > stopped 10/25/2012 as no better  - MCT 10/11/12 > Pos at 1.0 but no cough  - hfa 75% 10/25/2012 > trial of dulera 100 and off lopressor - resolved 12/06/12 p elevation of HOB and off all inhalers  Recurrent severe coughing esp in am x Nov 2016 indolent onset/ persistent daily > dark yellow mucus plugs some better on levaquin Still has dog in bedroom  Doe = MMRC2 = can't walk a nl pace on a flat grade s sob Cough and breathing some better with neb alb/atrovent rec Stop lopressor and start bystolic 5 mg each am  For cough > mucinex dm up to 1200 mg every 12hours Prednisone 10 mg take  4 each am x 2 days,   2 each am x 2 days,  1 each am x 2 days and stop  Augmentin 875 mg take one pill twice daily  X 10 days - Try prilosec otc 20mg   Take 30-60 min before first meal of the day and Pepcid ac (famotidine) 20 mg one @  bedtime until cough is completely gone for at least a week without the need for cough suppression GERD diet     03/08/2015  f/u ov/Rainn Zupko re: chronic asthma / chronic rhinitis no maint rx    Chief Complaint  Patient presents with  . Follow-up    Breathing is unchanged. He is coughing less. He has noticed some wheezing.   wife hearing wheezing at hs/ pt not aware but sob x min activity/ nose running watery x years/ still has dog in house and bedroom / prev eval by [facility]   No obvious day to day or daytime variability or assoc  cp or chest tightness, subjective wheeze or overt hb symptoms. No unusual exp hx or h/o childhood pna/ asthma or knowledge of premature birth.  Sleeping ok without nocturnal  or early am exacerbation  of respiratory  c/o's or need for noct saba. Also denies any obvious fluctuation of symptoms with weather or environmental changes or other aggravating or alleviating factors except as outlined above   Current Medications, Allergies, Complete Past Medical History, Past Surgical History, Family History, and Social History were reviewed in [employer] record.  ROS  The following are not active complaints unless bolded sore throat, dysphagia, dental problems, itching, sneezing,  nasal congestion or excess watery secretions, ear ache,   fever, chills, sweats, unintended wt loss, classically pleuritic or exertional cp,  hemoptysis,  orthopnea pnd or leg swelling, presyncope, palpitations, abdominal pain, anorexia, nausea, vomiting, diarrhea  or change in bowel or bladder habits, change in stools or urine, dysuria,hematuria,  rash, arthralgias, visual complaints, headache, numbness, weakness or ataxia or problems with walking or coordination,  change in mood/affect or memory.                 Objective:   Physical Exam  amb wm nad/ nasal tone resolved    03/08/2015       181   02/05/15 184 lb (83.462 kg)  11/24/14 178 lb 6.4 oz (80.922 kg)  07/19/14 173 lb 12.8 oz (78.835 kg)    Vital signs reviewed   HEENT: nl dentition, turbinates, and oropharynx. Nl external ear canals without cough reflex   NECK :  without JVD/Nodes/TM/ nl  carotid upstrokes bilaterally   LUNGS: no acc muscle use,  Nl contour chest with faint bilateral mid exp wheezes better with purse lip    CV:  RRR  no s3 or murmur or increase in P2,  Trace bilateral  lower ext  edema   ABD:  soft and nontender with nl inspiratory excursion in the supine position. No bruits or organomegaly, bowel sounds nl  MS:  Nl gait/ ext warm without deformities, calf tenderness, cyanosis or clubbing No obvious joint restrictions   SKIN: warm and dry without lesions    NEURO:  alert, approp, nl sensorium with  no motor deficits       CXR PA and Lateral:   03/08/2015 :    I personally reviewed images and agree with radiology impression as follows:   The nodules demonstrated on the previous CT scan are not as well demonstrated on this plain radiographic series. Please see the discussion above.        Assessment & Plan:

## 2015-03-08 NOTE — Patient Instructions (Addendum)
Start dulera 100 Take 2 puffs first thing in am and then another 2 puffs about 12 hours later.   Work on inhaler technique:  relax and gently blow all the way out then take a nice smooth deep breath back in, triggering the inhaler at same time you start breathing in.  Hold for up to 5 seconds if you can. Blow out thru nose. Rinse and gargle with water when done  If not satisfied with nasal symptoms return to see your ENT Doctor [facility])    Ok to resume the lopressor instead of bystolic  Please schedule a follow up office visit in 6 weeks, sooner if needed with full pfts on return (ok to push backwards but need pfts same day whenever that is)

## 2015-03-23 ENCOUNTER — Ambulatory Visit: Payer: Commercial Managed Care - HMO | Admitting: Cardiology

## 2015-04-23 ENCOUNTER — Other Ambulatory Visit: Payer: Self-pay | Admitting: Cardiology

## 2015-04-24 NOTE — Telephone Encounter (Signed)
Essential hypertension - Tanda Rockers, MD at 02/06/2015 6:57 AM     Status: Written Related Problem: Essential hypertension   Expand All Collapse All   Trial off lopressor and on bystolic [id number] due to ? Asthma   Strongly prefer in this setting(non-specific resp complaints) : Bystolic, the most beta -1 selective Beta blocker available in sample form, with bisoprolol the most selective generic choice on the market.   F/u in 4 weeks to regroup and consider further w/u for non-specific resp symptoms if not better on the most selective bb option  rx bystolic 5 mg daily samples         Progress Notes      Tanda Rockers, MD at 02/05/2015 9:19 AM     Status: Signed       Expand All Collapse All           Discontinued Medications       Reason for Discontinue    isosorbide mononitrate (IMDUR) 30 MG 24 hr tablet Error    metoprolol tartrate (LOPRESSOR) 25 MG tablet          Patient Instructions     Stop lopressor and start bystolic 5 mg each am    METOPROLOL WAS D/C'D BY DR. Melvyn Novas

## 2015-05-07 ENCOUNTER — Ambulatory Visit (INDEPENDENT_AMBULATORY_CARE_PROVIDER_SITE_OTHER): Payer: Commercial Managed Care - HMO | Admitting: Internal Medicine

## 2015-05-07 ENCOUNTER — Encounter: Payer: Self-pay | Admitting: Internal Medicine

## 2015-05-07 VITALS — BP 128/70 | HR 62 | Ht 61.0 in | Wt 178.0 lb

## 2015-05-07 DIAGNOSIS — J45991 Cough variant asthma: Secondary | ICD-10-CM

## 2015-05-07 DIAGNOSIS — J4540 Moderate persistent asthma, uncomplicated: Secondary | ICD-10-CM | POA: Diagnosis not present

## 2015-05-07 LAB — PULMONARY FUNCTION TEST
DL/VA % PRED: 91 %
DL/VA: 3.52 ml/min/mmHg/L
DLCO UNC % PRED: 68 %
DLCO UNC: 13.86 ml/min/mmHg
DLCO cor % pred: 67 %
DLCO cor: 13.67 ml/min/mmHg
FEF 25-75 PRE: 0.53 L/s
FEF 25-75 Post: 0.54 L/sec
FEF2575-%CHANGE-POST: 1 %
FEF2575-%PRED-POST: 55 %
FEF2575-%Pred-Pre: 54 %
FEV1-%CHANGE-POST: 0 %
FEV1-%PRED-PRE: 70 %
FEV1-%Pred-Post: 70 %
FEV1-PRE: 1.13 L
FEV1-Post: 1.13 L
FEV1FVC-%Change-Post: -1 %
FEV1FVC-%PRED-PRE: 80 %
FEV6-%CHANGE-POST: 1 %
FEV6-%PRED-POST: 89 %
FEV6-%Pred-Pre: 88 %
FEV6-POST: 1.95 L
FEV6-Pre: 1.92 L
FEV6FVC-%CHANGE-POST: 0 %
FEV6FVC-%PRED-PRE: 108 %
FEV6FVC-%Pred-Post: 108 %
FVC-%CHANGE-POST: 2 %
FVC-%Pred-Post: 82 %
FVC-%Pred-Pre: 81 %
FVC-Post: 2 L
FVC-Pre: 1.96 L
POST FEV1/FVC RATIO: 56 %
POST FEV6/FVC RATIO: 97 %
PRE FEV1/FVC RATIO: 58 %
Pre FEV6/FVC Ratio: 98 %
RV % PRED: 173 %
RV: 3.9 L
TLC % pred: 122 %
TLC: 6.42 L

## 2015-05-07 MED ORDER — AZELASTINE-FLUTICASONE 137-50 MCG/ACT NA SUSP: NASAL | Status: DC

## 2015-05-07 MED ORDER — PREDNISONE 10 MG PO TABS: ORAL_TABLET | ORAL | Status: DC

## 2015-05-07 MED ORDER — MONTELUKAST SODIUM 10 MG PO TABS: 10.0000 mg | ORAL_TABLET | Freq: Every day | ORAL | Status: DC

## 2015-05-07 NOTE — Progress Notes (Signed)
Subjective:    Patient ID: [name], male    DOB: 04-28-1928,     MRN: [id number]     Brief patient profile:  23 yowm never smoker with cough recurrent x 2011 with another flare in Nov 2016 > abn cxr > CT ? Post obst atx > referred to pulmonary clinic 02/05/2015 by Dr Delphina Cahill.   History of Present Illness  02/05/2015 1st [facility] office visit/ [facility]   Chief Complaint  Patient presents with  . Pulmonary Consult    Referred by Dr. Wende Neighbors for eval of abnormal ct chest. Pt c/o cough and increased SOB for the past month. Cough is prod with foamy sputum. He has also noticed some wheezing and chest tightness.   sinus drainage x years/ worse p eating / recurrent cough with prev w/u here:  - Allergy profile 08/06/2012 > No eos, IgE 130 > POS cats and dogs - sinus ct 08/10/2012 > Minimal mucosal thickening of the maxillary sinuses without the air fluid levels. - trial of singulair and 1st gen h1 at hs started 08/20/2012 > stopped 10/25/2012 as no better  - MCT 10/11/12 > Pos at 1.0 but no cough  - hfa 75% 10/25/2012 > trial of dulera 100 and off lopressor - resolved 12/06/12 p elevation of HOB and off all inhalers  Recurrent severe coughing esp in am x Nov 2016 indolent onset/ persistent daily > dark yellow mucus plugs some better on levaquin Still has dog in bedroom  Doe = MMRC2 = can't walk a nl pace on a flat grade s sob Cough and breathing some better with neb alb/atrovent rec Stop lopressor and start bystolic 5 mg each am  For cough > mucinex dm up to 1200 mg every 12hours Prednisone 10 mg take  4 each am x 2 days,   2 each am x 2 days,  1 each am x 2 days and stop  Augmentin 875 mg take one pill twice daily  X 10 days - Try prilosec otc 20mg   Take 30-60 min before first meal of the day and Pepcid ac (famotidine) 20 mg one @  bedtime until cough is completely gone for at least a week without the need for cough suppression GERD diet     03/08/2015  f/u ov/[facility] re:  chronic asthma / chronic rhinitis no maint rx  Chief Complaint  Patient presents with  . Follow-up    Breathing is unchanged. He is coughing less. He has noticed some wheezing.   wife hearing wheezing at hs/ pt not aware but sob x min activity/ nose running watery x years/ still has dog in house and bedroom / prev eval by [employer] [facility] rec Start dulera 100 Take 2 puffs first thing in am and then another 2 puffs about 12 hours later.  Work on inhaler technique:   If not satisfied with nasal symptoms return to see your ENT Doctor [facility])  Ok to resume the lopressor instead of bystolic    [id number]  f/u ov/[facility] re: severe chronic asthma > no better on dulera so d/c'd   Chief Complaint  Patient presents with  . Follow-up    PFT done today. Breathing is unchanged.   still occ having sneezing/ coughing spells more day than hs/noct / still has dog in bedroom    No obvious day to day or daytime variability or assoc excess/ purulent sputum or mucus plugs   cp or chest tightness, subjective wheeze or overt hb symptoms.  No unusual exp hx or h/o childhood pna/ asthma or knowledge of premature birth.  Sleeping ok without nocturnal  or early am exacerbation  of respiratory  c/o's or need for noct saba. Also denies any obvious fluctuation of symptoms with weather or environmental changes or other aggravating or alleviating factors except as outlined above   Current Medications, Allergies, Complete Past Medical History, Past Surgical History, Family History, and Social History were reviewed in [employer] record.  ROS  The following are not active complaints unless bolded sore throat, dysphagia, dental problems, itching, sneezing,  nasal congestion or excess watery secretions, ear ache,   fever, chills, sweats, unintended wt loss, classically pleuritic or exertional cp, hemoptysis,  orthopnea pnd or leg swelling, presyncope, palpitations, abdominal pain, anorexia, nausea, vomiting,  diarrhea  or change in bowel or bladder habits, change in stools or urine, dysuria,hematuria,  rash, arthralgias, visual complaints, headache, numbness, weakness or ataxia or problems with walking or coordination,  change in mood/affect or memory.                 Objective:   Physical Exam  amb wm nad can't get on to exam table due to arthritis in hips/knees  05/07/2015        178   03/08/2015       181   02/05/15 184 lb (83.462 kg)  11/24/14 178 lb 6.4 oz (80.922 kg)  07/19/14 173 lb 12.8 oz (78.835 kg)    Vital signs reviewed   HEENT: nl dentition, turbinates, and oropharynx. Nl external ear canals without cough reflex   NECK :  without JVD/Nodes/TM/ nl carotid upstrokes bilaterally   LUNGS: no acc muscle use,  Nl contour chest with faint bilateral mid exp wheezes better with purse lip    CV:  RRR  no s3 or murmur or increase in P2,  Trace bilateral  lower ext  edema   ABD:  soft and nontender with nl inspiratory excursion in the supine position. No bruits or organomegaly, bowel sounds nl  MS:  Nl gait/ ext warm without deformities, calf tenderness, cyanosis or clubbing No obvious joint restrictions   SKIN: warm and dry without lesions    NEURO:  alert, approp, nl sensorium with  no motor deficits       CXR PA and Lateral:   03/08/2015 :    I personally reviewed images and agree with radiology impression as follows:   The nodules demonstrated on the previous CT scan are not as well demonstrated on this plain radiographic series. Please see the discussion above.        Assessment & Plan:

## 2015-05-07 NOTE — Patient Instructions (Addendum)
If get worse this spring>> Prednisone 10 mg take  4 each am x 2 days,   2 each am x 2 days,  1 each am x 2 days and stop   Dymista one twice daily try sample > fill it if works better than flonase for your allergies and nasal congestion Singulair 10 mg daily x one month to see if helps sinus symptoms or cough   Please schedule a follow up visit in 3 months but call sooner if needed  Late add : needs alpha one AT next ov Late add: records show elevation of HOB and max gerd rx eliminated all symptoms in 2014 so should resume this also until next ov

## 2015-05-07 NOTE — Progress Notes (Signed)
PFT done today. 

## 2015-05-07 NOTE — Assessment & Plan Note (Addendum)
-   Allergy profile 08/06/2012 > No eos, IgE 130 > POS cats and dogs - sinus ct 08/10/2012 > Minimal mucosal thickening of the maxillary sinuses without the airfluid levels. - trial of singulair and 1st gen h1 at hs started 08/20/2012 > stopped 10/25/2012 as no better  - MCT 10/11/12 > Pos at 1.0 but no cough  - hfa 75% 10/25/2012 > trial of dulera 100 and off lopressor - resolved 12/06/12 p elevation of HOB and off all inhalers - recurrent cough Nov 2016 > trial off lopressor / on gerd rx and 10 days augmentin/ short course pred > "no better" - Spirometry 03/08/2015  FEV1 0.90 (36%)  Ratio 52  - 03/08/2015  extensive coaching HFA effectiveness =    75% restart dulera 100 2bid> no better so d/c p sample - PFT's  05/07/2015  FEV1 1.13 (70 % ) ratio 56  p 0 % improvement from saba p no rx  prior to study with DLCO  68 % corrects to 91 % for alv volume    Symptoms remain very difficult to control. DDX of  difficult airways management almost all start with A and  include Adherence, Ace Inhibitors, Acid Reflux, Active Sinus Disease, Alpha 1 Antitripsin deficiency, Anxiety masquerading as Airways dz,  ABPA,  Allergy(esp in young), Aspiration (esp in elderly), Adverse effects of meds,  Active smokers, A bunch of PE's (a small clot burden can't cause this syndrome unless there is already severe underlying pulm or vascular dz with poor reserve) plus two Bs  = Bronchiectasis and Beta blocker use..and one C= CHF   Adherence is always the initial "prime suspect" and is a multilayered concern that requires a "trust but verify" approach in every patient - starting with knowing how to use medications, especially inhalers, correctly, keeping up with refills and understanding the fundamental difference between maintenance and prns vs those medications only taken for a very short course and then stopped and not refilled.   ? Allergy/ > has Pos MCT but not better on dulera > try singulair again this time in combination with  dymsista(singular by itself not perceived as effective - again rec keeping all dogs out of br  ? Acid (or non-acid) GERD > always difficult to exclude as up to 75% of pts in some series report no assoc GI/ Heartburn symptoms> rec continue max (24h)  acid suppression and diet restrictions/elevation of HOB   ? Active sinus dz/ rhinitis > trial of dysmista > f/u ent prn   ? Alpha one AT def > reviewed epic records/ none sent > will do on return   ? BB > changed to bystolic not better> ok to continue lopressor    I had an extended discussion with the patient reviewing all relevant studies completed to date and  lasting 15 to 20 minutes of a 25 minute visit    Each maintenance medication was reviewed in detail including most importantly the difference between maintenance and prns and under what circumstances the prns are to be triggered using an action plan format that is not reflected in the computer generated alphabetically organized AVS.    Please see instructions for details which were reviewed in writing and the patient given a copy highlighting the part that I personally wrote and discussed at today's ov.

## 2015-05-08 ENCOUNTER — Telehealth: Payer: Self-pay | Admitting: Internal Medicine

## 2015-05-08 ENCOUNTER — Other Ambulatory Visit: Payer: Self-pay

## 2015-05-08 ENCOUNTER — Telehealth: Payer: Self-pay | Admitting: *Deleted

## 2015-05-08 MED ORDER — FAMOTIDINE 20 MG PO TABS: 20.0000 mg | ORAL_TABLET | Freq: Every day | ORAL | Status: DC

## 2015-05-08 MED ORDER — OMEPRAZOLE 20 MG PO CPDR: DELAYED_RELEASE_CAPSULE | ORAL | Status: DC

## 2015-05-08 MED ORDER — PANTOPRAZOLE SODIUM 40 MG PO TBEC: 40.0000 mg | DELAYED_RELEASE_TABLET | Freq: Every day | ORAL | Status: DC

## 2015-05-08 NOTE — Telephone Encounter (Signed)
-----   Message from Tanda Rockers, MD sent at 05/08/2015  6:25 AM EDT ----- Late add : needs alpha one AT next ov Late add: records show elevation of HOB and max gerd rx eliminated all symptoms in 2014 so should resume this also until next ov   Can call in Pantoprazole (protonix) 40 mg   Take  30-60 min before first meal of the day and Pepcid (famotidine)  20 mg one @  bedtime until return to office - this is the best way to tell whether stomach acid is contributing to your problem.    Or he can do Try prilosec otc 20mg   Take 30-60 min before first meal of the day and Pepcid ac (famotidine) 20 mg one @  bedtime until cough is completely gone for at least a week without the need for cough suppression

## 2015-05-08 NOTE — Telephone Encounter (Signed)
Then rec: Pantoprazole (protonix) 40 mg   Take  30-60 min before first meal of the day and Pepcid (famotidine)  20 mg one @  bedtime until return to office

## 2015-05-08 NOTE — Telephone Encounter (Signed)
Spoke with the pt's spouse, Judson Roch and notified of recs per MW  She verbalized understanding and will inform the pt  I have sent rxs for pepcid and prilosec per her request

## 2015-05-08 NOTE — Telephone Encounter (Signed)
Spoke with Tammy at [facility] and family is aware of interaction alert and aware that our office would give different medication.Gave verbal Rx for Protonix 40 mg #30 take 1 tablet 30-60 minutes before first meal of the day with 3 refills. Tammy also stated that patient and family picked up Pepcid earlier today. She will reach out to family and let them know of change in Rx per MW. Nothing more needed at this time.

## 2015-05-08 NOTE — Telephone Encounter (Signed)
Last ov with MW on 05/07/15   Patient Instructions       If get worse this spring>> Prednisone 10 mg take  4 each am x 2 days,   2 each am x 2 days,  1 each am x 2 days and stop   Dymista one twice daily try sample > fill it if works better than flonase for your allergies and nasal congestion Singulair 10 mg daily x one month to see if helps sinus symptoms or cough   Please schedule a follow up visit in 3 months but call sooner if needed   Late add : needs alpha one AT next ov Late add: records show elevation of HOB and max gerd rx eliminated all symptoms in 2014 so should resume this also until next ov    Called ands spoke with Tammy at [facility]. She states that there is a interaction between the omeprazole and Plavix. She states that if you take the omeprazole with Plavix it decreases the effectiveness of the Plavix. She states that pantoprazole is an alternative that can be used in the place of omeprazole. I explained to her that I would send the message to MW. She voiced understanding and had no further questions.   MW please advise

## 2015-05-17 ENCOUNTER — Ambulatory Visit: Payer: Commercial Managed Care - HMO | Admitting: Cardiology

## 2015-05-22 ENCOUNTER — Ambulatory Visit (INDEPENDENT_AMBULATORY_CARE_PROVIDER_SITE_OTHER): Payer: Commercial Managed Care - HMO | Admitting: Cardiology

## 2015-05-22 ENCOUNTER — Encounter: Payer: Self-pay | Admitting: Cardiology

## 2015-05-22 VITALS — BP 146/72 | HR 72 | Ht 64.0 in | Wt 180.8 lb

## 2015-05-22 DIAGNOSIS — I2510 Atherosclerotic heart disease of native coronary artery without angina pectoris: Secondary | ICD-10-CM | POA: Diagnosis not present

## 2015-05-22 DIAGNOSIS — N184 Chronic kidney disease, stage 4 (severe): Secondary | ICD-10-CM

## 2015-05-22 DIAGNOSIS — I10 Essential (primary) hypertension: Secondary | ICD-10-CM | POA: Diagnosis not present

## 2015-05-22 DIAGNOSIS — I255 Ischemic cardiomyopathy: Secondary | ICD-10-CM | POA: Diagnosis not present

## 2015-05-22 DIAGNOSIS — I2583 Coronary atherosclerosis due to lipid rich plaque: Principal | ICD-10-CM

## 2015-05-22 NOTE — Patient Instructions (Signed)
Continue your current therapy  I will see you in 6 months.   

## 2015-05-23 NOTE — Progress Notes (Signed)
Cardiology Office Note   Date:  05/23/2015   ID:  [name], DOB Feb 26, 1928, MRN [id number]  PCP:  Wende Neighbors, MD  Cardiologist: Diontae Route Martinique MD  Chief Complaint  Patient presents with  . New Evaluation    establish care--former Dr. Mare Ferrari pt  pt c/o SOB on exertion; occasional swelling in legs/feet/ankles      History of Present Illness: [name] is a 82 y.o. male who presents for follow up CAD. He is a former patient of Dr. Mare Ferrari.   He has a remote history of the large anterior wall myocardial infarction with late presentation in 2009. Cardiac cath at that time showed occluded LAD. He was treated medically.  The patient has a history of high blood pressure and chronic kidney disease. His last ejection fraction by echo in 02/27/13 showed an ejection fraction 40-45% with wall motion abnormalities.. His last nuclear stress test in March 2011 showed a large area of infarct with only minimal peri-infarct ischemia and an ejection fraction of 27%.   The patient has a history of occasional gout . He has not had any recent episodes of gout. The patient remains on renal dose allopurinol. He has chronic renal disease and is followed by a nephrologist, Dr.Befekadu, in [place]. He has severe problems with his right hip. He has had a previous reconstruction right hip socket which is now deteriorating. His orthopedist as indicated no further surgery is possible. The patient is walking with a walker. He also had left TKR x 2. He is severely limited by arthritis.  He previously had significant swelling on amlodipine and this was switched to hydralazine. He is on lasix 40 mg daily. He has stable chronic LE edema. No increased DOE or chest pain. He is a former Scientist, forensic but now raises stock cattle.   Past Medical History  Diagnosis Date  . Hypertension   . Gout   . IHD (ischemic heart disease)     Remote MI in 2009 with late presentation; no reperfusion. Has large area  of infarct in the apical anterior area on last nuclear in 2011. Managed medically  . Chronic renal insufficiency     creatinine ranges around 2.8  . OA (osteoarthritis) of knee     Left knee with revision and replacement of prosthetic L TKR  . 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 DOE (dyspnea on exertion)   . Herpes zoster 12/2009  . Peripheral edema   . RBBB (right bundle branch block)   . History of BPH   . Cancer ([place])   . Renal insufficiency   . Asthma     Past Surgical History  Procedure Laterality Date  . Cardiac catheterization  03/24/2007  . Total knee arthroplasty      left knee  . Cardiovascular stress test  04/02/2009    EF 27% AND A LARGE AREA OF INFARCT IN THE APICAL ANTERIOR WITH MILD PERI-INFARCT ISCHEMIA  . Transthoracic echocardiogram  04/04/2009    EF 45-50%  . Knee surgery  1998    left knee     Current Outpatient Prescriptions  Medication Sig Dispense Refill  . allopurinol (ZYLOPRIM) 100 MG tablet Take one half tablet by mouth once daily. ADDITIONAL REFILLS FROM PCP 30 tablet 1  . aspirin 81 MG tablet Take 81 mg by mouth every other day.      . clopidogrel (PLAVIX) 75 MG tablet Take 75 mg by mouth daily.    Marland Kitchen  famotidine (PEPCID) 20 MG tablet Take 1 tablet (20 mg total) by mouth at bedtime. 30 tablet 2  . furosemide (LASIX) 40 MG tablet Take 40 mg by mouth daily.     Marland Kitchen gabapentin (NEURONTIN) 100 MG capsule Take 200 mg by mouth at bedtime.     . hydrALAZINE (APRESOLINE) 50 MG tablet Take 1.5 tablets (75 mg total) by mouth 3 (three) times daily. 100 tablet 11  . isosorbide mononitrate (IMDUR) 30 MG 24 hr tablet Take 30 mg by mouth daily.    . metoprolol tartrate (LOPRESSOR) 25 MG tablet 1 tablet 2 x daily    . nitroGLYCERIN (NITROSTAT) 0.4 MG SL tablet Place 0.4 mg under the tongue every 5 (five) minutes as needed for chest pain (chest pain).    . pantoprazole (PROTONIX) 40 MG tablet Take 1 tablet (40 mg total) by mouth daily. 30 tablet 3  . triamcinolone  ointment (KENALOG) 0.1 % Apply 1 application topically daily as needed. (Itchy skin)     No current facility-administered medications for this visit.    Allergies:   Crestor; Gentamycin; Hctz; and Lipitor    Social History:  The patient  reports that he has never smoked. He has never used smokeless tobacco. He reports that he does not drink alcohol or use illicit drugs.   Family History:  The patient's family history includes Coronary artery disease in his mother; Emphysema in his father.    ROS:  Please see the history of present illness.   Otherwise, review of systems are positive for none.   All other systems are reviewed and negative.    PHYSICAL EXAM: VS:  BP 146/72 mmHg  Pulse 72  Ht 5\' 4"  (1.626 m)  Wt 82.01 kg (180 lb 12.8 oz)  BMI 31.02 kg/m2 , BMI Body mass index is 31.02 kg/(m^2). GEN: Well nourished, well developed, in no acute distress HEENT: normal Neck: no JVD, carotid bruits, or masses Cardiac: RRR; there is a grade 2/6 systolic ejection murmur at the base., rubs, or gallops, there is 1-2+ edema  Respiratory:  clear to auscultation bilaterally, normal work of breathing GI: soft, nontender, nondistended, + BS MS: no deformity or atrophy Skin: warm and dry, no rash Neuro:  Strength and sensation are intact Psych: euthymic mood, full affect   EKG:  EKG is not ordered today.    Recent Labs: No results found for requested labs within last 365 days.    Lipid Panel    Component Value Date/Time   CHOL 142 08/31/2013 0944   TRIG 42.0 08/31/2013 0944   HDL 39.40 08/31/2013 0944   CHOLHDL 4 08/31/2013 0944   VLDL 8.4 08/31/2013 0944   LDLCALC 94 08/31/2013 0944      Wt Readings from Last 3 Encounters:  05/22/15 82.01 kg (180 lb 12.8 oz)  05/07/15 80.74 kg (178 lb)  03/08/15 82.101 kg (181 lb)     Labs reviewed from 05/09/15: BUN 63, creatinine 3.77, Glucose 165, Hbg 13.2.    ASSESSMENT AND PLAN:  1. Ischemic heart disease status post old large  anterior wall myocardial infarction with late presentation, on dual antiplatelet therapy. Chronic occlusion of the LAD.  2. Chronic renal insufficiency stage IV. His labs are being followed by his PCP and his nephrologist. Most recent creatinine was better at 3.77 3. Essential hypertension without heart failure. 4. Ischemic cardiomyopathy with ejection fraction 45% by echocardiogram 02/27/13. On metoprolol. Not a candidate for ACEi/ARB due to CKD.  5. Right bundle branch block 6.  Dyslipidemia with statin intolerance 7. Severe osteoarthritis. Poor candidate for further hip surgery.   Current medicines are reviewed at length with the patient today.  The patient does not have concerns regarding medicines.  The following changes have been made:  no change  Labs/ tests ordered today include:  No orders of the defined types were placed in this encounter.     Disposition: Continue current medication.  Follow up 6 months for office visit with Dr. Martinique  Signed, Danni Leabo Martinique MD, [facility]    05/23/2015 [address], [place], [place]  [address] Phone: [phone]; Fax: [phone]

## 2015-06-05 NOTE — Telephone Encounter (Signed)
Opened in error

## 2015-06-12 ENCOUNTER — Other Ambulatory Visit: Payer: Self-pay | Admitting: *Deleted

## 2015-06-12 MED ORDER — CLOPIDOGREL BISULFATE 75 MG PO TABS: 75.0000 mg | ORAL_TABLET | Freq: Every day | ORAL | Status: DC

## 2015-07-27 ENCOUNTER — Other Ambulatory Visit: Payer: Self-pay

## 2015-07-27 MED ORDER — ISOSORBIDE MONONITRATE ER 30 MG PO TB24: 30.0000 mg | ORAL_TABLET | Freq: Every day | ORAL | Status: DC

## 2015-08-06 ENCOUNTER — Ambulatory Visit: Payer: Commercial Managed Care - HMO | Admitting: Internal Medicine

## 2015-08-17 ENCOUNTER — Telehealth: Payer: Self-pay | Admitting: Cardiology

## 2015-08-17 NOTE — Telephone Encounter (Signed)
New Message  Pt c/o Shortness Of Breath: STAT if SOB developed within the last 24 hours or pt is noticeably SOB on the phone  1. Are you currently SOB (can you hear that pt is SOB on the phone)? Pt wife states no   2. How long have you been experiencing SOB? Pt wife states a couple of days   3. Are you SOB when sitting or when up moving around? Pt wife states sitting and moving   4. Are you currently experiencing any other symptoms? Pt wife states Chest tightness

## 2015-08-17 NOTE — Telephone Encounter (Signed)
Returned call. Wife c/o pt having productive cough (mucus), chest tightness, some slight SOB x2 days. Inquired about meds - no missed doses. No noted wt gain or increased ankle edema. Pt denies worse SOB w/ position. Chest tightness he denies as being pain. States worse w/ cough.  Recommended urgent care/PCP appt for cough/URI symptoms and to elevate to ED visit if worse tightness in chest/increased SOB. Advised urgent care visit beneficial for CXR, antibiotics, etc. Also recommended taking extra 1/2 tab of furosemide if pt believes leg swelling any worse.  Wife, who is retired Therapist, sports, voiced understanding of recommendations and agreement w/ plan.

## 2015-08-27 ENCOUNTER — Other Ambulatory Visit (HOSPITAL_COMMUNITY): Payer: Self-pay | Admitting: Adult Health Nurse Practitioner

## 2015-08-27 ENCOUNTER — Ambulatory Visit (HOSPITAL_COMMUNITY)
Admission: RE | Admit: 2015-08-27 | Discharge: 2015-08-27 | Disposition: A | Discharge status: Home / Self Care | Payer: Commercial Managed Care - HMO | Source: Ambulatory Visit | Attending: Adult Health Nurse Practitioner | Admitting: Adult Health Nurse Practitioner

## 2015-08-27 DIAGNOSIS — R0602 Shortness of breath: Secondary | ICD-10-CM

## 2015-08-27 DIAGNOSIS — I517 Cardiomegaly: Secondary | ICD-10-CM | POA: Diagnosis not present

## 2015-08-27 DIAGNOSIS — R05 Cough: Secondary | ICD-10-CM | POA: Diagnosis not present

## 2015-08-27 DIAGNOSIS — R059 Cough, unspecified: Secondary | ICD-10-CM

## 2015-08-27 DIAGNOSIS — R918 Other nonspecific abnormal finding of lung field: Secondary | ICD-10-CM | POA: Insufficient documentation

## 2015-12-27 ENCOUNTER — Encounter: Payer: Self-pay | Admitting: Cardiology

## 2015-12-29 NOTE — Progress Notes (Signed)
Cardiology Office Note   Date:  01/01/2016   ID:  [name], DOB February 25, 1928, MRN [id number]  PCP:  Wende Neighbors, MD  Cardiologist: Nayellie Sanseverino Martinique MD  Chief Complaint  Patient presents with  . Follow-up  . Coronary Artery Disease  . Congestive Heart Failure      History of Present Illness: [name] is a 82 y.o. male who presents for follow up CAD.   He has a remote history of the large anterior wall myocardial infarction with late presentation in 2009. Cardiac cath at that time showed occluded LAD. He was treated medically.  The patient has a history of high blood pressure and chronic kidney disease. His last ejection fraction by echo in 02/27/13 showed an ejection fraction 40-45% with wall motion abnormalities. His last nuclear stress test in March 2011 showed a large area of infarct with only minimal peri-infarct ischemia and an ejection fraction of 27%.   He has chronic renal disease and is followed by a nephrologist, Dr.Befekadu, in [place]. He has severe problems with his right hip. He has had a previous reconstruction right hip socket which is now deteriorating. His orthopedist as indicated no further surgery is possible. The patient is walking with a walker. He also had left TKR x 2. He is severely limited by arthritis.  He previously had significant swelling on amlodipine and this was switched to hydralazine. He was noted to have increased swelling and congestion recently and was taking an extra 20 mg lasix every other day. Creatinine went up to 4.8 so extra lasix reduced to 10 mg every other day. Edema back to baseline. Followed closely by nephrology.   Past Medical History:  Diagnosis Date  . Asthma   . Cancer ([place])   . Chronic renal insufficiency    creatinine ranges around 2.8  . DOE (dyspnea on exertion)   . Gout   . Herpes zoster 12/2009  . History of BPH   . Hypertension   . IHD (ischemic heart disease)    Remote MI in 2009 with late presentation;  no reperfusion. Has large area of infarct in the apical anterior area on last nuclear in 2011. Managed medically  . MI, old 03/2007   ACUTE ANTEROSEPTAL  . OA (osteoarthritis) of knee    Left knee with revision and replacement of prosthetic L TKR  . Peripheral edema   . RBBB (right bundle branch block)   . Renal insufficiency     Past Surgical History:  Procedure Laterality Date  . CARDIAC CATHETERIZATION  03/24/2007  . CARDIOVASCULAR STRESS TEST  04/02/2009   EF 27% AND A LARGE AREA OF INFARCT IN THE APICAL ANTERIOR WITH MILD PERI-INFARCT ISCHEMIA  . KNEE SURGERY  1998   left knee  . TOTAL KNEE ARTHROPLASTY     left knee  . TRANSTHORACIC ECHOCARDIOGRAM  04/04/2009   EF 45-50%     Current Outpatient Prescriptions  Medication Sig Dispense Refill  . allopurinol (ZYLOPRIM) 100 MG tablet Take one half tablet by mouth once daily. ADDITIONAL REFILLS FROM PCP 30 tablet 1  . aspirin 81 MG tablet Take 81 mg by mouth every other day.      . clopidogrel (PLAVIX) 75 MG tablet Take 1 tablet (75 mg total) by mouth daily. 90 tablet 3  . famotidine (PEPCID) 20 MG tablet Take 1 tablet (20 mg total) by mouth at bedtime. 30 tablet 2  . furosemide (LASIX) 40 MG tablet Take 40 mg by mouth as directed.     Marland Kitchen  gabapentin (NEURONTIN) 100 MG capsule Take 200 mg by mouth at bedtime.     . hydrALAZINE (APRESOLINE) 50 MG tablet Take 1.5 tablets (75 mg total) by mouth 3 (three) times daily. 100 tablet 11  . isosorbide mononitrate (IMDUR) 30 MG 24 hr tablet Take 1 tablet (30 mg total) by mouth daily. 30 tablet 11  . metoprolol tartrate (LOPRESSOR) 25 MG tablet 1 tablet 2 x daily    . nitroGLYCERIN (NITROSTAT) 0.4 MG SL tablet Place 0.4 mg under the tongue every 5 (five) minutes as needed for chest pain (chest pain).    . pantoprazole (PROTONIX) 40 MG tablet Take 1 tablet (40 mg total) by mouth daily. 30 tablet 3  . triamcinolone ointment (KENALOG) 0.1 % Apply 1 application topically daily as needed. (Itchy skin)      . furosemide (LASIX) 20 MG tablet Take 0.5 tablets (10 mg total) by mouth every other day. 30 tablet 6   No current facility-administered medications for this visit.     Allergies:   Crestor [rosuvastatin calcium]; Gentamycin [gentamicin]; Hctz [hydrochlorothiazide]; and Lipitor [atorvastatin calcium]    Social History:  The patient  reports that he has never smoked. He has never used smokeless tobacco. He reports that he does not drink alcohol or use drugs.   Family History:  The patient's family history includes Coronary artery disease in his mother; Emphysema in his father.    ROS:  Please see the history of present illness.   Otherwise, review of systems are positive for none.   All other systems are reviewed and negative.    PHYSICAL EXAM: VS:  BP 138/74   Pulse 68   Ht 5\' 4"  (1.626 m)   Wt 180 lb 12.8 oz (82 kg)   BMI 31.03 kg/m  , BMI Body mass index is 31.03 kg/m. GEN: Well nourished, well developed, in no acute distress  HEENT: normal  Neck: no JVD, carotid bruits, or masses Cardiac: RRR; there is a grade 2/6 systolic ejection murmur at the base., rubs, or gallops, there is 1-2+ edema  Respiratory:  clear to auscultation bilaterally, normal work of breathing GI: soft, nontender, nondistended, + BS MS: no deformity or atrophy  Skin: warm and dry, no rash Neuro:  Strength and sensation are intact Psych: euthymic mood, full affect   EKG:  EKG is ordered today. NSR first degree AV block. LAFB, RBBB. LVH with repolarization abnormality. No change. I have personally reviewed and interpreted this study.   Recent Labs: No results found for requested labs within last 8760 hours.    Lipid Panel    Component Value Date/Time   CHOL 142 08/31/2013 0944   TRIG 42.0 08/31/2013 0944   HDL 39.40 08/31/2013 0944   CHOLHDL 4 08/31/2013 0944   VLDL 8.4 08/31/2013 0944   LDLCALC 94 08/31/2013 0944      Wt Readings from Last 3 Encounters:  01/01/16 180 lb 12.8 oz (82 kg)   05/22/15 180 lb 12.8 oz (82 kg)  05/07/15 178 lb (80.7 kg)     Labs reviewed from 05/09/15: BUN 63, creatinine 3.77, Glucose 165, Hbg 13.2.    ASSESSMENT AND PLAN:  1. Ischemic heart disease status post old large anterior wall myocardial infarction with late presentation, on dual antiplatelet therapy. Chronic occlusion of the LAD.  2. Chronic renal insufficiency stage IV. His labs are being followed by his PCP and his nephrologist. Most recent creatinine was increased 4.8 with increased diuretic dose. Weight back to baseline. 3. Essential  hypertension without heart failure. BP controlled. 4. Ischemic cardiomyopathy with ejection fraction 45% by echocardiogram 02/27/13. On metoprolol. Not a candidate for ACEi/ARB due to CKD.  5. LAFB/Right bundle branch block- chronic 6. Dyslipidemia with statin intolerance 7. Severe osteoarthritis. Poor candidate for further hip surgery.   Current medicines are reviewed at length with the patient today.  The patient does not have concerns regarding medicines.  The following changes have been made:  no change  Labs/ tests ordered today include:   Orders Placed This Encounter  Procedures  . EKG 12-Lead     Disposition: Continue current medication.  Follow up 6 months for office visit   Signed, Charliene Inoue Martinique MD, [facility]    01/01/2016 2:12 PM    [facility] [place] [place], [place], [place]  [address] Phone: [phone]; Fax: [phone]

## 2016-01-01 ENCOUNTER — Encounter: Payer: Self-pay | Admitting: Cardiology

## 2016-01-01 ENCOUNTER — Ambulatory Visit (INDEPENDENT_AMBULATORY_CARE_PROVIDER_SITE_OTHER): Payer: Commercial Managed Care - HMO | Admitting: Cardiology

## 2016-01-01 VITALS — BP 138/74 | HR 68 | Ht 64.0 in | Wt 180.8 lb

## 2016-01-01 DIAGNOSIS — I2510 Atherosclerotic heart disease of native coronary artery without angina pectoris: Secondary | ICD-10-CM

## 2016-01-01 DIAGNOSIS — I10 Essential (primary) hypertension: Secondary | ICD-10-CM | POA: Diagnosis not present

## 2016-01-01 DIAGNOSIS — N184 Chronic kidney disease, stage 4 (severe): Secondary | ICD-10-CM

## 2016-01-01 DIAGNOSIS — I2583 Coronary atherosclerosis due to lipid rich plaque: Secondary | ICD-10-CM | POA: Diagnosis not present

## 2016-01-01 DIAGNOSIS — I4510 Unspecified right bundle-branch block: Secondary | ICD-10-CM

## 2016-01-01 DIAGNOSIS — I259 Chronic ischemic heart disease, unspecified: Secondary | ICD-10-CM

## 2016-01-01 MED ORDER — FUROSEMIDE 20 MG PO TABS: 10.0000 mg | ORAL_TABLET | ORAL | 6 refills | Status: DC

## 2016-01-01 NOTE — Patient Instructions (Signed)
Continue your current therapy  Watch your salt intake   

## 2016-01-02 ENCOUNTER — Telehealth: Payer: Self-pay | Admitting: Cardiology

## 2016-01-02 NOTE — Telephone Encounter (Signed)
Patient is taking 40 mg lasix daily and then adds 10 mg every other day.  Raima Geathers Martinique MD, [facility]

## 2016-01-02 NOTE — Telephone Encounter (Signed)
Spoke to Tammy at [employer] and clarified indications of use. She voiced understanding and thanks, will make notations to Rx.

## 2016-01-02 NOTE — Telephone Encounter (Signed)
New message  Pharmacist needs to discuss medication  Lasix 20 mg 1/2 tab every other day

## 2016-01-02 NOTE — Telephone Encounter (Signed)
Spoke to pharmacist. Notes previously patient had been on lasix 40mg  daily.  See OV note yesterday. New Rx for 10mg  EOD. However, OV note indicates no changes to meds - continue current therapy. Informed pharmacist I would get clarification. Routed to provider to address.

## 2016-01-28 ENCOUNTER — Ambulatory Visit (INDEPENDENT_AMBULATORY_CARE_PROVIDER_SITE_OTHER): Payer: Medicare HMO | Admitting: Otolaryngology

## 2016-01-28 DIAGNOSIS — J342 Deviated nasal septum: Secondary | ICD-10-CM | POA: Diagnosis not present

## 2016-01-28 DIAGNOSIS — H608X3 Other otitis externa, bilateral: Secondary | ICD-10-CM | POA: Diagnosis not present

## 2016-01-28 DIAGNOSIS — J310 Chronic rhinitis: Secondary | ICD-10-CM | POA: Diagnosis not present

## 2016-01-28 DIAGNOSIS — J343 Hypertrophy of nasal turbinates: Secondary | ICD-10-CM

## 2016-01-30 DIAGNOSIS — L918 Other hypertrophic disorders of the skin: Secondary | ICD-10-CM | POA: Diagnosis not present

## 2016-01-30 DIAGNOSIS — L570 Actinic keratosis: Secondary | ICD-10-CM | POA: Diagnosis not present

## 2016-01-30 DIAGNOSIS — C44311 Basal cell carcinoma of skin of nose: Secondary | ICD-10-CM | POA: Diagnosis not present

## 2016-01-30 DIAGNOSIS — D485 Neoplasm of uncertain behavior of skin: Secondary | ICD-10-CM | POA: Diagnosis not present

## 2016-01-30 DIAGNOSIS — Z23 Encounter for immunization: Secondary | ICD-10-CM | POA: Diagnosis not present

## 2016-02-14 DIAGNOSIS — R809 Proteinuria, unspecified: Secondary | ICD-10-CM | POA: Diagnosis not present

## 2016-02-14 DIAGNOSIS — Z79899 Other long term (current) drug therapy: Secondary | ICD-10-CM | POA: Diagnosis not present

## 2016-02-14 DIAGNOSIS — N183 Chronic kidney disease, stage 3 (moderate): Secondary | ICD-10-CM | POA: Diagnosis not present

## 2016-02-14 DIAGNOSIS — E559 Vitamin D deficiency, unspecified: Secondary | ICD-10-CM | POA: Diagnosis not present

## 2016-02-14 DIAGNOSIS — I10 Essential (primary) hypertension: Secondary | ICD-10-CM | POA: Diagnosis not present

## 2016-02-14 DIAGNOSIS — D509 Iron deficiency anemia, unspecified: Secondary | ICD-10-CM | POA: Diagnosis not present

## 2016-03-11 DIAGNOSIS — C44311 Basal cell carcinoma of skin of nose: Secondary | ICD-10-CM | POA: Diagnosis not present

## 2016-03-18 DIAGNOSIS — N184 Chronic kidney disease, stage 4 (severe): Secondary | ICD-10-CM | POA: Diagnosis not present

## 2016-03-18 DIAGNOSIS — I509 Heart failure, unspecified: Secondary | ICD-10-CM | POA: Diagnosis not present

## 2016-03-18 DIAGNOSIS — N250 Renal osteodystrophy: Secondary | ICD-10-CM | POA: Diagnosis not present

## 2016-03-18 DIAGNOSIS — R809 Proteinuria, unspecified: Secondary | ICD-10-CM | POA: Diagnosis not present

## 2016-04-10 DIAGNOSIS — I509 Heart failure, unspecified: Secondary | ICD-10-CM | POA: Diagnosis not present

## 2016-04-10 DIAGNOSIS — R600 Localized edema: Secondary | ICD-10-CM | POA: Diagnosis not present

## 2016-04-10 DIAGNOSIS — J208 Acute bronchitis due to other specified organisms: Secondary | ICD-10-CM | POA: Diagnosis not present

## 2016-04-10 DIAGNOSIS — R69 Illness, unspecified: Secondary | ICD-10-CM | POA: Diagnosis not present

## 2016-04-29 DIAGNOSIS — M19012 Primary osteoarthritis, left shoulder: Secondary | ICD-10-CM | POA: Diagnosis not present

## 2016-05-01 DIAGNOSIS — D509 Iron deficiency anemia, unspecified: Secondary | ICD-10-CM | POA: Diagnosis not present

## 2016-05-01 DIAGNOSIS — I10 Essential (primary) hypertension: Secondary | ICD-10-CM | POA: Diagnosis not present

## 2016-05-02 ENCOUNTER — Telehealth: Payer: Self-pay | Admitting: Cardiology

## 2016-05-02 MED ORDER — NITROGLYCERIN 0.4 MG SL SUBL: 0.4000 mg | SUBLINGUAL_TABLET | SUBLINGUAL | 11 refills | Status: AC | PRN

## 2016-05-02 NOTE — Telephone Encounter (Signed)
Returned call to patient spoke to wife.She stated husband has been having chest tightness and sob for the past week.No chest tightness at present.Appointment scheduled with Richardson Dopp PA Monday 05/05/16 at 3:45 pm at [facility] office.Advised to go to ED if chest tightness returns.

## 2016-05-02 NOTE — Telephone Encounter (Signed)
New message  Pt wife is calling stating that pt has chest tightness, and SOB when he walks or does any activity.  Pt c/o of Chest Pain: STAT if CP now or developed within 24 hours  1. Are you having CP right now? Yes-pt wife states he just walked from the restroom  2. Are you experiencing any other symptoms (ex. SOB, nausea, vomiting, sweating)? SOB  3. How long have you been experiencing CP? About a week off and on  4. Is your CP continuous or coming and going? Comes and goes when he walks  5. Have you taken Nitroglycerin? no ?

## 2016-05-05 ENCOUNTER — Ambulatory Visit (INDEPENDENT_AMBULATORY_CARE_PROVIDER_SITE_OTHER): Payer: Medicare HMO | Admitting: Physician Assistant

## 2016-05-05 ENCOUNTER — Encounter: Payer: Self-pay | Admitting: Physician Assistant

## 2016-05-05 VITALS — BP 140/70 | HR 80 | Ht 64.0 in | Wt 184.0 lb

## 2016-05-05 DIAGNOSIS — I25119 Atherosclerotic heart disease of native coronary artery with unspecified angina pectoris: Secondary | ICD-10-CM

## 2016-05-05 DIAGNOSIS — I5042 Chronic combined systolic (congestive) and diastolic (congestive) heart failure: Secondary | ICD-10-CM

## 2016-05-05 DIAGNOSIS — N184 Chronic kidney disease, stage 4 (severe): Secondary | ICD-10-CM | POA: Diagnosis not present

## 2016-05-05 DIAGNOSIS — I10 Essential (primary) hypertension: Secondary | ICD-10-CM

## 2016-05-05 MED ORDER — ISOSORBIDE MONONITRATE ER 60 MG PO TB24: 60.0000 mg | ORAL_TABLET | Freq: Every day | ORAL | 3 refills | Status: DC

## 2016-05-05 NOTE — Progress Notes (Signed)
Cardiology Office Note:    Date:  05/05/2016   ID:  [name], DOB [birth year], MRN [id number]  PCP:  Wende Neighbors, MD  Cardiologist:  Dr. Peter Martinique   Electrophysiologist:  n/a  Referring MD: Celene Squibb, MD   Chief Complaint  Patient presents with  . Chest Pain  . Shortness of Breath    History of Present Illness:    [name] is a 83 y.o. male with a hx of CAD status post late presentation of a large inter-wall myocardial infarction in 2009. Cardiac catheterization demonstrated an occluded LAD and this was treated medically. Other history includes HTN, CKD, combined systolic and diastolic CHF 2/2 ischemic cardiomyopathy with EF 40-45. Nuclear stress test in 3/11 demonstrated large area of infarct with only minimal peri-infarct ischemia, EF 27. Last seen by Dr. Martinique 12/17.  He recently noted worsening chest pain and shortness of breath with exertion.  He returns for evaluation.  He is here with his wife.  He is limited by hip arthritis and walks with a walker.  He has chronic dyspnea on exertion but has noted worsening symptoms in the past week with assoc chest pain described as tightness.  He has not taken NTG.  His symptoms resolve quickly with rest.  He sleeps on a wedge.  He denies PND. His LE edema is unchanged.  He denies syncope.   Prior CV studies:   The following studies were reviewed today:  Echo 2/15 EF 40-45, DK and aneurysmal deformity of inf and septal segments of apex c/w infarction in distribution of LAD, mod ant-septal and ant HK, poss mural thrombus, PASP 37  LHC 3/09 LM normal LAD occluded LCx 30-40 RCA with 30 at ostium of PDA  Past Medical History:  Diagnosis Date  . Asthma   . Cancer ([place])   . Chronic renal insufficiency    creatinine ranges around 2.8  . DOE (dyspnea on exertion)   . Gout   . Herpes zoster 12/2009  . History of BPH   . Hypertension   . IHD (ischemic heart disease)    Remote MI in 2009 with late presentation; no  reperfusion. Has large area of infarct in the apical anterior area on last nuclear in 2011. Managed medically  . MI, old 03/2007   ACUTE ANTEROSEPTAL  . OA (osteoarthritis) of knee    Left knee with revision and replacement of prosthetic L TKR  . Peripheral edema   . RBBB (right bundle branch block)   . Renal insufficiency     Past Surgical History:  Procedure Laterality Date  . CARDIAC CATHETERIZATION  03/24/2007  . CARDIOVASCULAR STRESS TEST  04/02/2009   EF 27% AND A LARGE AREA OF INFARCT IN THE APICAL ANTERIOR WITH MILD PERI-INFARCT ISCHEMIA  . KNEE SURGERY  1998   left knee  . TOTAL KNEE ARTHROPLASTY     left knee  . TRANSTHORACIC ECHOCARDIOGRAM  04/04/2009   EF 45-50%    Current Medications: Current Meds  Medication Sig  . allopurinol (ZYLOPRIM) 100 MG tablet Take one half tablet by mouth once daily. ADDITIONAL REFILLS FROM PCP  . aspirin 81 MG tablet Take 81 mg by mouth every other day.    . clopidogrel (PLAVIX) 75 MG tablet Take 1 tablet (75 mg total) by mouth daily.  . famotidine (PEPCID) 20 MG tablet Take 1 tablet (20 mg total) by mouth at bedtime.  . furosemide (LASIX) 20 MG tablet Take 0.5 tablets (10 mg total) by mouth  every other day.  . furosemide (LASIX) 40 MG tablet Take 40 mg by mouth as directed.   . gabapentin (NEURONTIN) 100 MG capsule Take 200 mg by mouth at bedtime.   . hydrALAZINE (APRESOLINE) 50 MG tablet Take 1.5 tablets (75 mg total) by mouth 3 (three) times daily.  . metoprolol tartrate (LOPRESSOR) 25 MG tablet 1 tablet 2 x daily  . nitroGLYCERIN (NITROSTAT) 0.4 MG SL tablet Place 1 tablet (0.4 mg total) under the tongue every 5 (five) minutes as needed for chest pain (chest pain).  . pantoprazole (PROTONIX) 40 MG tablet Take 1 tablet (40 mg total) by mouth daily.  Marland Kitchen triamcinolone ointment (KENALOG) 0.1 % Apply 1 application topically daily as needed. (Itchy skin)  . [DISCONTINUED] isosorbide mononitrate (IMDUR) 30 MG 24 hr tablet Take 1 tablet (30 mg  total) by mouth daily.     Allergies:   Crestor [rosuvastatin calcium]; Gentamycin [gentamicin]; Hctz [hydrochlorothiazide]; and Lipitor [atorvastatin calcium]   Social History   Social History  . Marital status: Married    Spouse name: N/A  . Number of children: N/A  . Years of education: N/A   Occupational History  . Retired [employer] Topics  . Smoking status: Never Smoker  . Smokeless tobacco: Never Used  . Alcohol use No  . Drug use: No  . Sexual activity: Not Asked   Other Topics Concern  . None   Social History Narrative  . None     Family History  Problem Relation Age of Onset  . Coronary artery disease Mother   . Emphysema Father     never smoked, worked as a Psychologist, sport and exercise     ROS:   Please see the history of present illness.    ROS All other systems reviewed and are negative.   EKGs/Labs/Other Test Reviewed:    EKG:  EKG is  ordered today.  The ekg ordered today demonstrates NSR, HR 80, left axis deviation, RBBB, IVCD, first-degree AV block, PR 260 ms, QTC 495 ms, no significant change compared to prior tracings  Recent Labs: No results found for requested labs within last 8760 hours.   Recent Lipid Panel    Component Value Date/Time   CHOL 142 08/31/2013 0944   TRIG 42.0 08/31/2013 0944   HDL 39.40 08/31/2013 0944   CHOLHDL 4 08/31/2013 0944   VLDL 8.4 08/31/2013 0944   LDLCALC 94 08/31/2013 0944     Physical Exam:    VS:  BP 140/70   Pulse 80   Ht 5\' 4"  (1.626 m)   Wt 184 lb (83.5 kg)   BMI 31.58 kg/m     Wt Readings from Last 3 Encounters:  05/05/16 184 lb (83.5 kg)  01/01/16 180 lb 12.8 oz (82 kg)  05/22/15 180 lb 12.8 oz (82 kg)     Physical Exam  Constitutional: He is oriented to person, place, and time. He appears well-developed and well-nourished. No distress.  HENT:  Head: Normocephalic and atraumatic.  Eyes: No scleral icterus.  Neck: Normal range of motion. No JVD (I cannot appreciate JVD) present.    Cardiovascular: Normal rate, regular rhythm, S1 normal and S2 normal.   No murmur heard. Pulmonary/Chest: He has decreased breath sounds. He has no wheezes. He has no rhonchi. He has no rales.  Abdominal: Soft. There is no hepatomegaly. There is no tenderness.  Musculoskeletal: He exhibits edema (1+ bilateral ankle edema).  Neurological: He is alert and oriented to person, place, and  time.  Skin: Skin is warm and dry.  Psychiatric: He has a normal mood and affect.    ASSESSMENT:    1. Coronary artery disease involving native coronary artery of native heart with angina pectoris ([place])   2. Chronic combined systolic and diastolic CHF (congestive heart failure) ([place])   3. CKD (chronic kidney disease), stage IV ([place])   4. Essential hypertension    PLAN:    In order of problems listed above:  1. Coronary artery disease involving native coronary artery of native heart with angina pectoris [facility]) -  He is s/p anterior MI with late presentation in 2009 tx medically. He had mild non-obs disease elsewhere.  Myoview in 2011 demonstrated anterior scar and no significant ischemia.  He is now having exertional shortness of breath and chest pain.  His symptoms sound concerning for angina but I question if volume excess is also playing a role. No obvious rales on exam.  His weight is up 4 lbs since last visit.  He has a chronic cough that has worsened in the last week.  Unfortunately,  He is a poor candidate for cardiac cath due to his CKD and high risk for contrast induced nephropathy.  Therefore, I do not think a stress test would be helpful at this point.    -  Continue aspirin, Plavix, beta blocker  -  Increase isosorbide to 60 mg daily  -  Close follow-up in one week with Dr. Martinique  -  Patient knows to go to the emergency room if he has rest symptoms  2. Chronic combined systolic and diastolic CHF (congestive heart failure) ([place]) - EF 40-45 by echocardiogram in 2015. Question if symptoms are  related to volume excess. His exam is difficult. His weight is up 4 pounds since last visit. I will adjust his Lasix for several days and adjust his antianginal therapy as outlined above.  -  Increase Lasix to 60 mg daily 5 days, then resume usual dose  -  BMET 1 week  3. CKD (chronic kidney disease), stage IV ([place]) -  He has had increasing creatinine with adjustments in Lasix in the past. Therefore, I will have him on higher dose of Lasix for 5 days only. Plan: Basic Metabolic Panel (BMET) 1 week.  4. Essential hypertension -  BP borderline elevated.  Adjust medications as noted.    Dispo:  Return in about 1 week (around 05/12/2016) for Close Follow Up with Dr. Martinique.   Medication Adjustments/Labs and Tests Ordered: Current medicines are reviewed at length with the patient today.  Concerns regarding medicines are outlined above.  Medication changes, Labs and Tests ordered today are outlined in the Patient Instructions noted below. Patient Instructions  Medication Instructions:  1. INCREASE IMDUR TO 60 MG DAILY; NEW RX HAS BEEN SENT IN 2. INCREASE LASIX TO 60 MG DAILY FOR 5 DAYS; AFTER THE 5 DAYS YOU WILL GO BACK TO LASIX 40 MG EVERY MORNING AND 10 MG IN THE PM EVERY OTHER DAY  Labwork: BMET TO BE DONE IN 1 WEEK AT [facility]   Testing/Procedures: NONE ORDERED   Follow-Up: DR. Martinique IN 1 WEEK OR APP AT THE [facility]  Any Other Special Instructions Will Be Listed Below (If Applicable).  If you need a refill on your cardiac medications before your next appointment, please call your pharmacy.  Signed, Richardson Dopp, PA-C  05/05/2016 5:16 PM    [place], [place]  [address] Phone: [phone]; Fax: [phone]

## 2016-05-05 NOTE — Patient Instructions (Addendum)
Medication Instructions:  1. INCREASE IMDUR TO 60 MG DAILY; NEW RX HAS BEEN SENT IN 2. INCREASE LASIX TO 60 MG DAILY FOR 5 DAYS; AFTER THE 5 DAYS YOU WILL GO BACK TO LASIX 40 MG EVERY MORNING AND 10 MG IN THE PM EVERY OTHER DAY  Labwork: BMET TO BE DONE IN 1 WEEK AT [facility]   Testing/Procedures: NONE ORDERED   Follow-Up: DR. Martinique IN 1 WEEK OR APP AT THE [facility]  Any Other Special Instructions Will Be Listed Below (If Applicable).  If you need a refill on your cardiac medications before your next appointment, please call your pharmacy.

## 2016-05-07 DIAGNOSIS — E559 Vitamin D deficiency, unspecified: Secondary | ICD-10-CM | POA: Diagnosis not present

## 2016-05-07 DIAGNOSIS — N185 Chronic kidney disease, stage 5: Secondary | ICD-10-CM | POA: Diagnosis not present

## 2016-05-07 DIAGNOSIS — D509 Iron deficiency anemia, unspecified: Secondary | ICD-10-CM | POA: Diagnosis not present

## 2016-05-07 DIAGNOSIS — R809 Proteinuria, unspecified: Secondary | ICD-10-CM | POA: Diagnosis not present

## 2016-05-07 DIAGNOSIS — Z79899 Other long term (current) drug therapy: Secondary | ICD-10-CM | POA: Diagnosis not present

## 2016-05-07 DIAGNOSIS — I10 Essential (primary) hypertension: Secondary | ICD-10-CM | POA: Diagnosis not present

## 2016-05-12 NOTE — Progress Notes (Signed)
Cardiology Office Note    Date:  05/14/2016   ID:  [name], DOB 1928/05/13, MRN [id number]  PCP:  Wende Neighbors, MD  Cardiologist: Dr. Martinique   Chief Complaint  Patient presents with  . Follow-up    edema, dyspnea    History of Present Illness:    [name] is a 83 y.o. male with past medical history of CAD (s/p MI in 2009 with occlusion of LAD --> medically treated), chronic combined systolic and diastolic CHF, ischemic cardiomyopathy, Stage 4 CKD, and HTN who presents to the office today for 1-week follow-up.   He was recently seen in the office by Richardson Dopp, PA-C on 05/05/2016 and reported worsening chest pain and dyspnea on exertion with his symptoms resolving with rest. His symptoms were thought to be concerning for worsening angina but with his CKD, he is a poor cardiac catheterization candidate and therefore it was thought a NST would not be overly helpful. Therefore, medical management was recommended and he was continued on ASA, Plavix, and BB therapy with Imdur being increased from 30mg  daily to 60mg  daily. He was also thought to be volume overloaded on physical examination and weight was up 4 lbs when compared to his prior visit, therefore he was instructed to increase Lasix to 60mg  for 5 days then resume at regular dosing of 40mg  daily.   In talking with the patient today, he reports significant improvement in his lower extremity edema since his last office visit. It is difficult to assess his respiratory status as he is not overly active at baseline secondary to significant hip pain. He does report improvement in a dry cough he had at the time of his last visit. He does not weigh regularly on his home scales. Reports he was seen by his Nephrologist last week and was instructed to stop the 10 mg Lasix dosing as this was not overly effective with the Stage IV CAD.  He reports still having a mild chest tightness when walking for extended periods of time. Unsure if this  has improved with his Imdur dosing. Says this has been present for several months and denies any acute worsening of this.    Past Medical History:  Diagnosis Date  . Asthma   . Cancer ([place])   . Chronic renal insufficiency    creatinine ranges around 2.8  . DOE (dyspnea on exertion)   . Gout   . Herpes zoster 12/2009  . History of BPH   . Hypertension   . IHD (ischemic heart disease)    Remote MI in 2009 with late presentation; no reperfusion. Has large area of infarct in the apical anterior area on last nuclear in 2011. Managed medically  . MI, old 03/2007   ACUTE ANTEROSEPTAL  . OA (osteoarthritis) of knee    Left knee with revision and replacement of prosthetic L TKR  . Peripheral edema   . RBBB (right bundle branch block)   . Renal insufficiency     Past Surgical History:  Procedure Laterality Date  . CARDIAC CATHETERIZATION  03/24/2007  . CARDIOVASCULAR STRESS TEST  04/02/2009   EF 27% AND A LARGE AREA OF INFARCT IN THE APICAL ANTERIOR WITH MILD PERI-INFARCT ISCHEMIA  . KNEE SURGERY  1998   left knee  . TOTAL KNEE ARTHROPLASTY     left knee  . TRANSTHORACIC ECHOCARDIOGRAM  04/04/2009   EF 45-50%    Current Medications: Outpatient Medications Prior to Visit  Medication Sig Dispense Refill  .  allopurinol (ZYLOPRIM) 100 MG tablet Take one half tablet by mouth once daily. ADDITIONAL REFILLS FROM PCP 30 tablet 1  . aspirin 81 MG tablet Take 81 mg by mouth every other day.      . clopidogrel (PLAVIX) 75 MG tablet Take 1 tablet (75 mg total) by mouth daily. 90 tablet 3  . famotidine (PEPCID) 20 MG tablet Take 1 tablet (20 mg total) by mouth at bedtime. 30 tablet 2  . furosemide (LASIX) 40 MG tablet Take 40 mg by mouth as directed.     . hydrALAZINE (APRESOLINE) 50 MG tablet Take 1.5 tablets (75 mg total) by mouth 3 (three) times daily. 100 tablet 11  . isosorbide mononitrate (IMDUR) 60 MG 24 hr tablet Take 1 tablet (60 mg total) by mouth daily. 90 tablet 3  . metoprolol  tartrate (LOPRESSOR) 25 MG tablet 1 tablet 2 x daily    . nitroGLYCERIN (NITROSTAT) 0.4 MG SL tablet Place 1 tablet (0.4 mg total) under the tongue every 5 (five) minutes as needed for chest pain (chest pain). 25 tablet 11  . pantoprazole (PROTONIX) 40 MG tablet Take 1 tablet (40 mg total) by mouth daily. 30 tablet 3  . triamcinolone ointment (KENALOG) 0.1 % Apply 1 application topically daily as needed. (Itchy skin)    . furosemide (LASIX) 20 MG tablet Take 0.5 tablets (10 mg total) by mouth every other day. 30 tablet 6  . gabapentin (NEURONTIN) 100 MG capsule Take 200 mg by mouth at bedtime.      No facility-administered medications prior to visit.      Allergies:   Crestor [rosuvastatin calcium]; Gentamycin [gentamicin]; Hctz [hydrochlorothiazide]; and Lipitor [atorvastatin calcium]   Social History   Social History  . Marital status: Married    Spouse name: N/A  . Number of children: N/A  . Years of education: N/A   Occupational History  . Retired [employer] Topics  . Smoking status: Never Smoker  . Smokeless tobacco: Never Used  . Alcohol use No  . Drug use: No  . Sexual activity: Not Asked   Other Topics Concern  . None   Social History Narrative  . None     Family History:  The patient's family history includes Coronary artery disease in his mother; Emphysema in his father.   Review of Systems:   Please see the history of present illness.     General:  No chills, fever, night sweats or weight changes.  Cardiovascular:  No edema, orthopnea, palpitations, paroxysmal nocturnal dyspnea. Positive for dyspnea on exertion and chest pain.  Dermatological: No rash, lesions/masses Respiratory: No cough, dyspnea Urologic: No hematuria, dysuria Abdominal:   No nausea, vomiting, diarrhea, bright red blood per rectum, melena, or hematemesis Neurologic:  No visual changes, wkns, changes in mental status. All other systems reviewed and are otherwise negative  except as noted above.   Physical Exam:    VS:  BP (!) 128/56   Pulse (!) 58   Resp 16   Wt 178 lb 2 oz (80.8 kg)   SpO2 95%   BMI 30.58 kg/m    General: Well developed, elderly Caucasian male appearing in no acute distress. Head: Normocephalic, atraumatic, sclera non-icteric, no xanthomas, nares are without discharge.  Neck: No carotid bruits. JVD at 8cm.  Lungs: Respirations regular and unlabored, without wheezes or rales.  Heart: Regular rate and rhythm. No S3 or S4.  No murmur, no rubs, or gallops appreciated. Abdomen: Soft, non-tender, non-distended  with normoactive bowel sounds. No hepatomegaly. No rebound/guarding. No obvious abdominal masses. Msk:  Strength and tone appear normal for age. No joint deformities or effusions. Extremities: No clubbing or cyanosis. Trace lower extremity edema bilaterally.  Distal pedal pulses are 2+ bilaterally. Neuro: Alert and oriented X 3. Moves all extremities spontaneously. No focal deficits noted. Psych:  Responds to questions appropriately with a normal affect. Skin: No rashes or lesions noted  Wt Readings from Last 3 Encounters:  05/14/16 178 lb 2 oz (80.8 kg)  05/05/16 184 lb (83.5 kg)  01/01/16 180 lb 12.8 oz (82 kg)     Studies/Labs Reviewed:   EKG:  EKG is not ordered today.    Recent Labs: No results found for requested labs within last 8760 hours.   Lipid Panel    Component Value Date/Time   CHOL 142 08/31/2013 0944   TRIG 42.0 08/31/2013 0944   HDL 39.40 08/31/2013 0944   CHOLHDL 4 08/31/2013 0944   VLDL 8.4 08/31/2013 0944   LDLCALC 94 08/31/2013 0944    Additional studies/ records that were reviewed today include:   Echocardiogram: 02/27/2013 Study Conclusions  - Left ventricle: The cavity size was normal. Wall thickness was normal. Systolic function was mildly to moderately reduced. The estimated ejection fraction was in the range of 40% to 45%. Dyskinesis and aneurysmal deformity of the inferior and  septal segments of the apex; consistent with infarction in the distribution of the left anterior descending coronary artery. Moderate hypokinesis of the mid-distalanteroseptal and anterior myocardium. There was a possible, flat (mural), fixedthrombus. - Pulmonary arteries: Systolic pressure was mildly increased. PA peak pressure: 25mm Hg (S). Transthoracic echocardiography. M-mode, complete 2D, spectral Doppler, and color Doppler. Height: Height: 160cm. Height: 63in. Weight: Weight: 71.7kg. Weight: 157.7lb. Body mass index: BMI: 28kg/m^2. Body surface area:  BSA: 1.80m^2. Blood pressure:   142/70. Patient status: Inpatient. Location: Bedside.    Assessment:    1. Coronary artery disease involving native coronary artery of native heart with angina pectoris ([place])   2. Chronic combined systolic and diastolic CHF (congestive heart failure) ([place])   3. Ischemic heart disease   4. Essential hypertension   5. CKD (chronic kidney disease), stage IV ([place])      Plan:   In order of problems listed above:  1. CAD - s/p MI in 2009 with occlusion of LAD --> medically treated. He reports still having a mild chest tightness when walking for extended periods of time. Imdur dosing was recently increased from 30mg  daily to 60mg  daily and has helped with his symptoms. Reviewed with the patient and his family that a stress test would only be for diagnostic purposes as he would not be a cardiac catheterization candidate with his Stage 4 CKD and he wishes to avoid HD at this time. Therefore, medical management is being pursued. - continue ASA, Plavix, Imdur, and BB therapy. Intolerant to statins.    2. Chronic combined systolic and diastolic CHF/ Ischemic cardiomyopathy - EF at 40-45% by echo in 2015. Seen in the office last week and noted to be significantly volume overloaded. Was on increased Lasix dosing for 5 days and has since resumed Lasix 40mg  daily. Weight is down 6 lbs  to 178 lbs and his volume status appears improved, still with trace lower extremity edema.  - I recommended continuing with PO Lasix 40mg  daily with instructions to take an additional tablet for weight gain > 3 lbs overnight or 5 lbs in one week. Repeat BMET today. -  continue BB. Not on an ACE-I/ARB/ARNI secondary to Stage 4 CKD.   3. Essential HTN - BP well-controlled at 128/56 during today's visit. - continue Imdur 60mg  daily and Lopressor 25mg  BID.   4. Stage 4 CKD - patient's family reports creatinine was at 3.97 when last checked by PCP on 4/12. Baseline at 3.7-3.9. - followed by Dr. Lowanda Foster in [place], [place].    Medication Adjustments/Labs and Tests Ordered: Current medicines are reviewed at length with the patient today.  Concerns regarding medicines are outlined above.  Medication changes, Labs and Tests ordered today are listed in the Patient Instructions below. Patient Instructions  Medication Instructions:  Continue Lasix 40mg  daily.     Take extra 40mg  lasix if weight increases by 3lbs overnight or 5lbs in one week.   CALL our office if weight >182 lbs.  Labwork: Please have lab work today Artist)  Testing/Procedures: NONE  Follow-Up: Keep follow up with Dr. Martinique.  Any Other Special Instructions Will Be Listed Below (If Applicable).  If you need a refill on your cardiac medications before your next appointment, please call your pharmacy.  Signed, Erma Heritage, PA-C  05/14/2016 [facility] [address], [place], [place]  [address] Phone: [phone]; Fax: [phone]  [address] [place], [place] [address] Phone: [phone]

## 2016-05-13 DIAGNOSIS — D638 Anemia in other chronic diseases classified elsewhere: Secondary | ICD-10-CM | POA: Diagnosis not present

## 2016-05-13 DIAGNOSIS — M109 Gout, unspecified: Secondary | ICD-10-CM | POA: Diagnosis not present

## 2016-05-13 DIAGNOSIS — I509 Heart failure, unspecified: Secondary | ICD-10-CM | POA: Diagnosis not present

## 2016-05-13 DIAGNOSIS — R809 Proteinuria, unspecified: Secondary | ICD-10-CM | POA: Diagnosis not present

## 2016-05-13 DIAGNOSIS — I10 Essential (primary) hypertension: Secondary | ICD-10-CM | POA: Diagnosis not present

## 2016-05-13 DIAGNOSIS — N250 Renal osteodystrophy: Secondary | ICD-10-CM | POA: Diagnosis not present

## 2016-05-13 DIAGNOSIS — N185 Chronic kidney disease, stage 5: Secondary | ICD-10-CM | POA: Diagnosis not present

## 2016-05-14 ENCOUNTER — Ambulatory Visit (INDEPENDENT_AMBULATORY_CARE_PROVIDER_SITE_OTHER): Payer: Medicare HMO | Admitting: Student

## 2016-05-14 ENCOUNTER — Other Ambulatory Visit: Payer: Self-pay | Admitting: Physician Assistant

## 2016-05-14 ENCOUNTER — Encounter: Payer: Self-pay | Admitting: Student

## 2016-05-14 VITALS — BP 128/56 | HR 58 | Resp 16 | Wt 178.1 lb

## 2016-05-14 DIAGNOSIS — I259 Chronic ischemic heart disease, unspecified: Secondary | ICD-10-CM

## 2016-05-14 DIAGNOSIS — I25119 Atherosclerotic heart disease of native coronary artery with unspecified angina pectoris: Secondary | ICD-10-CM

## 2016-05-14 DIAGNOSIS — N184 Chronic kidney disease, stage 4 (severe): Secondary | ICD-10-CM | POA: Diagnosis not present

## 2016-05-14 DIAGNOSIS — I10 Essential (primary) hypertension: Secondary | ICD-10-CM | POA: Diagnosis not present

## 2016-05-14 DIAGNOSIS — I5042 Chronic combined systolic (congestive) and diastolic (congestive) heart failure: Secondary | ICD-10-CM | POA: Diagnosis not present

## 2016-05-14 LAB — BASIC METABOLIC PANEL
BUN: 97 mg/dL — ABNORMAL HIGH (ref 7–25)
CHLORIDE: 103 mmol/L (ref 98–110)
CO2: 22 mmol/L (ref 20–31)
Calcium: 9.8 mg/dL (ref 8.6–10.3)
Creat: 4.26 mg/dL — ABNORMAL HIGH (ref 0.70–1.11)
GLUCOSE: 102 mg/dL — AB (ref 65–99)
Potassium: 4.4 mmol/L (ref 3.5–5.3)
SODIUM: 142 mmol/L (ref 135–146)

## 2016-05-14 NOTE — Patient Instructions (Signed)
Medication Instructions:  Continue Lasix 40mg  daily.     Take extra 40mg  lasix if weight increases by 3lbs overnight or 5lbs in one week.   CALL our office if weight >182 lbs.  Labwork: Please have lab work today Artist)  Testing/Procedures: NONE  Follow-Up: Keep follow up with Dr. Martinique.  Any Other Special Instructions Will Be Listed Below (If Applicable).     If you need a refill on your cardiac medications before your next appointment, please call your pharmacy.

## 2016-05-15 ENCOUNTER — Other Ambulatory Visit: Payer: Self-pay

## 2016-05-15 DIAGNOSIS — N184 Chronic kidney disease, stage 4 (severe): Secondary | ICD-10-CM

## 2016-05-15 DIAGNOSIS — I10 Essential (primary) hypertension: Secondary | ICD-10-CM

## 2016-05-22 ENCOUNTER — Telehealth: Payer: Self-pay | Admitting: Student

## 2016-05-22 ENCOUNTER — Other Ambulatory Visit: Payer: Self-pay

## 2016-05-22 DIAGNOSIS — I10 Essential (primary) hypertension: Secondary | ICD-10-CM

## 2016-05-22 DIAGNOSIS — N184 Chronic kidney disease, stage 4 (severe): Secondary | ICD-10-CM

## 2016-05-22 NOTE — Telephone Encounter (Signed)
Released BMET order

## 2016-05-22 NOTE — Telephone Encounter (Signed)
New message       Pt is at [facility] in [place] and they do not have an order for a creatinine.  Please call

## 2016-05-22 NOTE — Telephone Encounter (Signed)
s/w Jeani Hawking at [facility] in [place] notified

## 2016-06-09 DIAGNOSIS — Z96641 Presence of right artificial hip joint: Secondary | ICD-10-CM | POA: Diagnosis not present

## 2016-06-09 DIAGNOSIS — M1A9XX0 Chronic gout, unspecified, without tophus (tophi): Secondary | ICD-10-CM | POA: Diagnosis not present

## 2016-06-09 DIAGNOSIS — Z8572 Personal history of non-Hodgkin lymphomas: Secondary | ICD-10-CM | POA: Diagnosis not present

## 2016-06-09 DIAGNOSIS — Z6834 Body mass index (BMI) 34.0-34.9, adult: Secondary | ICD-10-CM | POA: Diagnosis not present

## 2016-06-09 DIAGNOSIS — Z0000 Encounter for general adult medical examination without abnormal findings: Secondary | ICD-10-CM | POA: Diagnosis not present

## 2016-06-09 DIAGNOSIS — E669 Obesity, unspecified: Secondary | ICD-10-CM | POA: Diagnosis not present

## 2016-06-09 DIAGNOSIS — I129 Hypertensive chronic kidney disease with stage 1 through stage 4 chronic kidney disease, or unspecified chronic kidney disease: Secondary | ICD-10-CM | POA: Diagnosis not present

## 2016-06-09 DIAGNOSIS — Z955 Presence of coronary angioplasty implant and graft: Secondary | ICD-10-CM | POA: Diagnosis not present

## 2016-06-09 DIAGNOSIS — I25119 Atherosclerotic heart disease of native coronary artery with unspecified angina pectoris: Secondary | ICD-10-CM | POA: Diagnosis not present

## 2016-06-09 DIAGNOSIS — N184 Chronic kidney disease, stage 4 (severe): Secondary | ICD-10-CM | POA: Diagnosis not present

## 2016-06-09 DIAGNOSIS — H9112 Presbycusis, left ear: Secondary | ICD-10-CM | POA: Diagnosis not present

## 2016-06-09 DIAGNOSIS — Z79899 Other long term (current) drug therapy: Secondary | ICD-10-CM | POA: Diagnosis not present

## 2016-06-09 DIAGNOSIS — Z96652 Presence of left artificial knee joint: Secondary | ICD-10-CM | POA: Diagnosis not present

## 2016-06-09 DIAGNOSIS — R2689 Other abnormalities of gait and mobility: Secondary | ICD-10-CM | POA: Diagnosis not present

## 2016-06-09 DIAGNOSIS — H02431 Paralytic ptosis of right eyelid: Secondary | ICD-10-CM | POA: Diagnosis not present

## 2016-06-23 NOTE — Progress Notes (Signed)
Cardiology Office Note   Date:  06/25/2016   ID:  [name], DOB 1928-09-19, MRN [id number]  PCP:  Celene Squibb, MD  Cardiologist: Dejohn Ibarra Martinique MD  Chief Complaint  Patient presents with  . Follow-up    pt c/o SOB and chest tightness when moving around      History of Present Illness: [name] is a 83 y.o. male who presents for follow up CAD.  He has a remote history of the large anterior wall myocardial infarction with late presentation in 2009. Cardiac cath at that time showed occluded LAD. He was treated medically.  The patient has a history of high blood pressure and chronic kidney disease. His last ejection fraction by echo in 02/27/13 showed an ejection fraction 40-45% with wall motion abnormalities. His last nuclear stress test in March 2011 showed a large area of infarct with only minimal peri-infarct ischemia and an ejection fraction of 27%.   He has chronic renal disease and is followed by a nephrologist, Dr.Befekadu, in [place]. Baseline creatinine 3.7-3.9.  He has severe problems with his right hip. He has had a previous reconstruction right hip socket which has since  deteriorated. His orthopedist has indicated no further surgery is possible. He also had left TKR x 2. He is severely limited by arthritis.   He previously had significant swelling on amlodipine and this was switched to hydralazine.   Seen in April by Richardson Dopp PA-C. Noted increased symptoms of chest pain and SOB. Felt to be a poor candidate for cardiac cath. Weight up 4 lbs. Lasix increased to 60 mg daily for 5 days. Imdur increased to 60 mg daily. Follow up one week later showed improvement in swelling and chest pain. Lasix reduced back to 40 mg daily.   Of follow up today he is seen in a wheelchair. He is seen with his wife and son. He notes SOB when walking down the hall with chest tightness but no pain. Feet have still been swelling intermittently. Wife increased lasix to 40 mg bid last  week for 5 days but didn't see much improvement. He has no orthopnea or PND. No dizziness. No cough. He is only weighing occasionally at home.  Past Medical History:  Diagnosis Date  . Asthma   . Cancer ([place])   . Chronic renal insufficiency    creatinine ranges around 2.8  . DOE (dyspnea on exertion)   . Gout   . Herpes zoster 12/2009  . History of BPH   . Hypertension   . IHD (ischemic heart disease)    Remote MI in 2009 with late presentation; no reperfusion. Has large area of infarct in the apical anterior area on last nuclear in 2011. Managed medically  . MI, old 03/2007   ACUTE ANTEROSEPTAL  . OA (osteoarthritis) of knee    Left knee with revision and replacement of prosthetic L TKR  . Peripheral edema   . RBBB (right bundle branch block)   . Renal insufficiency     Past Surgical History:  Procedure Laterality Date  . CARDIAC CATHETERIZATION  03/24/2007  . CARDIOVASCULAR STRESS TEST  04/02/2009   EF 27% AND A LARGE AREA OF INFARCT IN THE APICAL ANTERIOR WITH MILD PERI-INFARCT ISCHEMIA  . KNEE SURGERY  1998   left knee  . TOTAL KNEE ARTHROPLASTY     left knee  . TRANSTHORACIC ECHOCARDIOGRAM  04/04/2009   EF 45-50%     Current Outpatient Prescriptions  Medication Sig Dispense  Refill  . allopurinol (ZYLOPRIM) 100 MG tablet Take one half tablet by mouth once daily. ADDITIONAL REFILLS FROM PCP 30 tablet 1  . aspirin 81 MG tablet Take 81 mg by mouth every other day.      . clopidogrel (PLAVIX) 75 MG tablet TAKE 1 TABLET BY MOUTH EVERY DAY 90 tablet 0  . famotidine (PEPCID) 20 MG tablet Take 1 tablet (20 mg total) by mouth at bedtime. 30 tablet 2  . furosemide (LASIX) 40 MG tablet Take 40 mg by mouth as directed.     . hydrALAZINE (APRESOLINE) 50 MG tablet Take 1.5 tablets (75 mg total) by mouth 3 (three) times daily. 100 tablet 11  . isosorbide mononitrate (IMDUR) 60 MG 24 hr tablet Take 1 tablet (60 mg total) by mouth daily. 90 tablet 3  . metoprolol tartrate (LOPRESSOR) 25  MG tablet 1 tablet 2 x daily    . nitroGLYCERIN (NITROSTAT) 0.4 MG SL tablet Place 1 tablet (0.4 mg total) under the tongue every 5 (five) minutes as needed for chest pain (chest pain). 25 tablet 11  . pantoprazole (PROTONIX) 40 MG tablet Take 1 tablet (40 mg total) by mouth daily. 30 tablet 3  . triamcinolone ointment (KENALOG) 0.1 % Apply 1 application topically daily as needed. (Itchy skin)     No current facility-administered medications for this visit.     Allergies:   Crestor [rosuvastatin calcium]; Gentamycin [gentamicin]; Hctz [hydrochlorothiazide]; and Lipitor [atorvastatin calcium]    Social History:  The patient  reports that he has never smoked. He has never used smokeless tobacco. He reports that he does not drink alcohol or use drugs.   Family History:  The patient's family history includes Coronary artery disease in his mother; Emphysema in his father.    ROS:  Please see the history of present illness.   Otherwise, review of systems are positive for none.   All other systems are reviewed and negative.    PHYSICAL EXAM: VS:  BP 119/66   Pulse 62   Ht 5\' 4"  (1.626 m)   Wt 182 lb 12.8 oz (82.9 kg)   BMI 31.38 kg/m  , BMI Body mass index is 31.38 kg/m. GEN: Well nourished, well developed, in no acute distress  HEENT: normal  Neck: no JVD, carotid bruits, or masses Cardiac: RRR; there is a grade 2/6 systolic ejection murmur at the base., rubs, or gallops, there is 1+ pitting edema  Respiratory:  clear to auscultation bilaterally, normal work of breathing GI: soft, nontender, nondistended, + BS MS: no deformity or atrophy  Skin: warm and dry, no rash Neuro:  Strength and sensation are intact Psych: euthymic mood, full affect   EKG:  EKG is not ordered today.  Lab Results  Component Value Date   WBC 8.9 03/07/2013   HGB 12.5 (L) 03/07/2013   HCT 37.0 (L) 03/07/2013   PLT 302 03/07/2013   GLUCOSE 102 (H) 05/14/2016   CHOL 142 08/31/2013   TRIG 42.0 08/31/2013     HDL 39.40 08/31/2013   LDLCALC 94 08/31/2013   ALT 16 08/31/2013   AST 19 08/31/2013   NA 142 05/14/2016   K 4.4 05/14/2016   CL 103 05/14/2016   CREATININE 4.26 (H) 05/14/2016   BUN 97 (H) 05/14/2016   CO2 22 05/14/2016   TSH 1.032 03/03/2013   INR 1.70 (H) 09/05/2010    Wt Readings from Last 3 Encounters:  06/25/16 182 lb 12.8 oz (82.9 kg)  05/14/16 178 lb 2 oz (  80.8 kg)  05/05/16 184 lb (83.5 kg)      ASSESSMENT AND PLAN:  1. Ischemic heart disease status post old large anterior wall myocardial infarction with late presentation, on dual antiplatelet therapy. Chronic occlusion of the LAD. Anginal symptoms improved with increase Imdur but still has exertional chest tightness with exertion. Poor conditioning and volume status also contributing. He is not a candidate for coronary angiography at this time due to advanced age and severe CKD.  2. Chronic renal insufficiency stage IV. His labs are being followed by his PCP and his nephrologist.Weight up slightly today. I think with poor renal function he will probably require higher doses of lasix to achieve desired effect. He has follow up with Nephrology next week.  3. Essential hypertension without heart failure. BP controlled. 4. Chronic combined systolic/diastolic CHF. Ischemic cardiomyopathy with ejection fraction 45% by echocardiogram 02/27/13. On metoprolol. Not a candidate for ACEi/ARB due to CKD. Given #2 I have recommended he weigh daily. If he has increased weight or edema then increasing lasix to 80 mg twice a day for a few days. I expect he will require higher maintenance lasix doses soon and this may result in decline in renal indices.  5. LAFB/Right bundle branch block- chronic 6. Dyslipidemia with statin intolerance 7. Severe osteoarthritis. Poor candidate for further hip surgery.   Current medicines are reviewed at length with the patient today.  The patient does not have concerns regarding medicines.  The following  changes have been made:  no change  Labs/ tests ordered today include:   No orders of the defined types were placed in this encounter.    Disposition: Continue current medication.  Follow up 4 months for office visit   Signed, Teigan Manner Martinique MD, [facility]    06/25/2016 7:37 PM    [address], [place], [place]  [address] Phone: [phone]; Fax: [phone]

## 2016-06-24 ENCOUNTER — Other Ambulatory Visit: Payer: Self-pay | Admitting: Cardiology

## 2016-06-25 ENCOUNTER — Encounter: Payer: Self-pay | Admitting: Cardiology

## 2016-06-25 ENCOUNTER — Ambulatory Visit (INDEPENDENT_AMBULATORY_CARE_PROVIDER_SITE_OTHER): Payer: Medicare HMO | Admitting: Cardiology

## 2016-06-25 VITALS — BP 119/66 | HR 62 | Ht 64.0 in | Wt 182.8 lb

## 2016-06-25 DIAGNOSIS — I259 Chronic ischemic heart disease, unspecified: Secondary | ICD-10-CM

## 2016-06-25 DIAGNOSIS — I5042 Chronic combined systolic (congestive) and diastolic (congestive) heart failure: Secondary | ICD-10-CM | POA: Diagnosis not present

## 2016-06-25 DIAGNOSIS — I2583 Coronary atherosclerosis due to lipid rich plaque: Secondary | ICD-10-CM | POA: Diagnosis not present

## 2016-06-25 DIAGNOSIS — N184 Chronic kidney disease, stage 4 (severe): Secondary | ICD-10-CM | POA: Diagnosis not present

## 2016-06-25 DIAGNOSIS — I2510 Atherosclerotic heart disease of native coronary artery without angina pectoris: Secondary | ICD-10-CM | POA: Diagnosis not present

## 2016-06-25 DIAGNOSIS — I10 Essential (primary) hypertension: Secondary | ICD-10-CM | POA: Diagnosis not present

## 2016-06-25 NOTE — Patient Instructions (Addendum)
Continue your current therapy  If he has increased swelling I would give him 80 mg twice a day for 2 days only  I will see you in 4 months

## 2016-07-10 DIAGNOSIS — N183 Chronic kidney disease, stage 3 (moderate): Secondary | ICD-10-CM | POA: Diagnosis not present

## 2016-07-10 DIAGNOSIS — Z79899 Other long term (current) drug therapy: Secondary | ICD-10-CM | POA: Diagnosis not present

## 2016-07-10 DIAGNOSIS — D509 Iron deficiency anemia, unspecified: Secondary | ICD-10-CM | POA: Diagnosis not present

## 2016-07-10 DIAGNOSIS — R809 Proteinuria, unspecified: Secondary | ICD-10-CM | POA: Diagnosis not present

## 2016-07-10 DIAGNOSIS — I10 Essential (primary) hypertension: Secondary | ICD-10-CM | POA: Diagnosis not present

## 2016-07-10 DIAGNOSIS — E559 Vitamin D deficiency, unspecified: Secondary | ICD-10-CM | POA: Diagnosis not present

## 2016-07-15 DIAGNOSIS — I10 Essential (primary) hypertension: Secondary | ICD-10-CM | POA: Diagnosis not present

## 2016-07-15 DIAGNOSIS — R809 Proteinuria, unspecified: Secondary | ICD-10-CM | POA: Diagnosis not present

## 2016-07-15 DIAGNOSIS — D631 Anemia in chronic kidney disease: Secondary | ICD-10-CM | POA: Diagnosis not present

## 2016-07-15 DIAGNOSIS — N185 Chronic kidney disease, stage 5: Secondary | ICD-10-CM | POA: Diagnosis not present

## 2016-07-15 DIAGNOSIS — N250 Renal osteodystrophy: Secondary | ICD-10-CM | POA: Diagnosis not present

## 2016-07-15 DIAGNOSIS — I509 Heart failure, unspecified: Secondary | ICD-10-CM | POA: Diagnosis not present

## 2016-07-15 DIAGNOSIS — M109 Gout, unspecified: Secondary | ICD-10-CM | POA: Diagnosis not present

## 2016-07-16 DIAGNOSIS — R69 Illness, unspecified: Secondary | ICD-10-CM | POA: Diagnosis not present

## 2016-08-13 DIAGNOSIS — I10 Essential (primary) hypertension: Secondary | ICD-10-CM | POA: Diagnosis not present

## 2016-08-13 DIAGNOSIS — Z79899 Other long term (current) drug therapy: Secondary | ICD-10-CM | POA: Diagnosis not present

## 2016-08-13 DIAGNOSIS — N184 Chronic kidney disease, stage 4 (severe): Secondary | ICD-10-CM | POA: Diagnosis not present

## 2016-08-19 DIAGNOSIS — N185 Chronic kidney disease, stage 5: Secondary | ICD-10-CM | POA: Diagnosis not present

## 2016-08-19 DIAGNOSIS — D649 Anemia, unspecified: Secondary | ICD-10-CM | POA: Diagnosis not present

## 2016-08-19 DIAGNOSIS — I509 Heart failure, unspecified: Secondary | ICD-10-CM | POA: Diagnosis not present

## 2016-08-19 DIAGNOSIS — M109 Gout, unspecified: Secondary | ICD-10-CM | POA: Diagnosis not present

## 2016-08-19 DIAGNOSIS — R809 Proteinuria, unspecified: Secondary | ICD-10-CM | POA: Diagnosis not present

## 2016-08-19 DIAGNOSIS — I10 Essential (primary) hypertension: Secondary | ICD-10-CM | POA: Diagnosis not present

## 2016-09-08 ENCOUNTER — Other Ambulatory Visit: Payer: Self-pay | Admitting: Cardiology

## 2016-09-10 DIAGNOSIS — E559 Vitamin D deficiency, unspecified: Secondary | ICD-10-CM | POA: Diagnosis not present

## 2016-09-10 DIAGNOSIS — R809 Proteinuria, unspecified: Secondary | ICD-10-CM | POA: Diagnosis not present

## 2016-09-10 DIAGNOSIS — D509 Iron deficiency anemia, unspecified: Secondary | ICD-10-CM | POA: Diagnosis not present

## 2016-09-10 DIAGNOSIS — I10 Essential (primary) hypertension: Secondary | ICD-10-CM | POA: Diagnosis not present

## 2016-09-10 DIAGNOSIS — N185 Chronic kidney disease, stage 5: Secondary | ICD-10-CM | POA: Diagnosis not present

## 2016-09-10 DIAGNOSIS — Z79899 Other long term (current) drug therapy: Secondary | ICD-10-CM | POA: Diagnosis not present

## 2016-09-16 DIAGNOSIS — R809 Proteinuria, unspecified: Secondary | ICD-10-CM | POA: Diagnosis not present

## 2016-09-16 DIAGNOSIS — N185 Chronic kidney disease, stage 5: Secondary | ICD-10-CM | POA: Diagnosis not present

## 2016-09-16 DIAGNOSIS — N250 Renal osteodystrophy: Secondary | ICD-10-CM | POA: Diagnosis not present

## 2016-09-16 DIAGNOSIS — M109 Gout, unspecified: Secondary | ICD-10-CM | POA: Diagnosis not present

## 2016-09-16 DIAGNOSIS — I10 Essential (primary) hypertension: Secondary | ICD-10-CM | POA: Diagnosis not present

## 2016-09-16 DIAGNOSIS — I509 Heart failure, unspecified: Secondary | ICD-10-CM | POA: Diagnosis not present

## 2016-09-16 DIAGNOSIS — D649 Anemia, unspecified: Secondary | ICD-10-CM | POA: Diagnosis not present

## 2016-09-18 ENCOUNTER — Encounter: Payer: Self-pay | Admitting: Physician Assistant

## 2016-09-18 ENCOUNTER — Ambulatory Visit (INDEPENDENT_AMBULATORY_CARE_PROVIDER_SITE_OTHER): Payer: Medicare HMO | Admitting: Physician Assistant

## 2016-09-18 VITALS — BP 140/76 | HR 73 | Ht 64.0 in | Wt 180.8 lb

## 2016-09-18 DIAGNOSIS — N184 Chronic kidney disease, stage 4 (severe): Secondary | ICD-10-CM

## 2016-09-18 DIAGNOSIS — I10 Essential (primary) hypertension: Secondary | ICD-10-CM | POA: Diagnosis not present

## 2016-09-18 DIAGNOSIS — Z01810 Encounter for preprocedural cardiovascular examination: Secondary | ICD-10-CM | POA: Diagnosis not present

## 2016-09-18 DIAGNOSIS — I255 Ischemic cardiomyopathy: Secondary | ICD-10-CM

## 2016-09-18 DIAGNOSIS — I2510 Atherosclerotic heart disease of native coronary artery without angina pectoris: Secondary | ICD-10-CM | POA: Diagnosis not present

## 2016-09-18 DIAGNOSIS — I4510 Unspecified right bundle-branch block: Secondary | ICD-10-CM | POA: Diagnosis not present

## 2016-09-18 NOTE — Patient Instructions (Signed)
Your physician recommends that you schedule a follow-up appointment in: AS SCHEDULED  

## 2016-09-18 NOTE — Progress Notes (Signed)
Cardiology Office Note    Date:  09/20/2016   ID:  [name], DOB May 05, 1928, MRN [id number]  PCP:  Celene Squibb, MD  Cardiologist:  Dr. Martinique Nephrologist: Dr. Lowanda Foster in [place]  Chief Complaint  Patient presents with  . Follow-up    surgical clearance /dialysis    History of Present Illness:  [name] is a 83 y.o. male with PMH of CKD stage IV-V, HTN, CAD, RBBB and OA. He had a remote anterior with late presentation in 2009. Cardiac catheterization in the time showed occluded LAD, he was treated medically. Last Myoview in March 2011 showed a large anterior infarct with only minimal peri-infarct ischemia and EF 27%. EF by echocardiogram in February 2015 was 40-45% with wall motion abnormalities. He was seen by Richardson Dopp in April 2018 for shortness of breath and chest pain. Felt to be a poor candidate for cardiac cath. His weight was up, Lasix was increased to 60 mg daily for 5 days before dropping back down to 40 mg daily. Imdur was increased to 60 mg daily.  He presents today for cardiology visit. He is actually taking 80 mg daily of Lasix managed by his nephrologist. He is near dialysis level, [place], family is planning to do peritoneal dialysis. He has a scheduled visit to see surgeon next Thursday and his wife expects a request for preoperative clearance. Therefore, she tentatively scheduled today's visit follow-up preop cardiac clearance. Pocket with the patient, he continued to have dyspnea on exertion, he says he would feel short of breath if he walk more than 25 yards. Walking long distance also produce a chest pressure sensation. This is unchanged in the past few month. Otherwise he appears to be euvolemic on physical exam. Given his advanced age and poor renal function, he would not be a good candidate for cardiac catheterization. He is still making urine at this point. Ultimately, without the capability of doing cardiac catheterization, I'm not  entirely sure if there is any benefit of ordering a stress test on this patient. He will be at least a moderate to high risk patient for the intended procedure which could be low to moderate risk depending on the type of the procedure. I will discuss this case with Dr. Martinique, his primary cardiologist.   Past Medical History:  Diagnosis Date  . Asthma   . Cancer ([place])   . Chronic renal insufficiency    creatinine ranges around 2.8  . DOE (dyspnea on exertion)   . Gout   . Herpes zoster 12/2009  . History of BPH   . Hypertension   . IHD (ischemic heart disease)    Remote MI in 2009 with late presentation; no reperfusion. Has large area of infarct in the apical anterior area on last nuclear in 2011. Managed medically  . MI, old 03/2007   ACUTE ANTEROSEPTAL  . OA (osteoarthritis) of knee    Left knee with revision and replacement of prosthetic L TKR  . Peripheral edema   . RBBB (right bundle branch block)   . Renal insufficiency     Past Surgical History:  Procedure Laterality Date  . CARDIAC CATHETERIZATION  03/24/2007  . CARDIOVASCULAR STRESS TEST  04/02/2009   EF 27% AND A LARGE AREA OF INFARCT IN THE APICAL ANTERIOR WITH MILD PERI-INFARCT ISCHEMIA  . KNEE SURGERY  1998   left knee  . TOTAL KNEE ARTHROPLASTY     left knee  . TRANSTHORACIC ECHOCARDIOGRAM  04/04/2009   EF 45-50%    Current Medications: Outpatient Medications Prior to Visit  Medication Sig Dispense Refill  . allopurinol (ZYLOPRIM) 100 MG tablet Take one half tablet by mouth once daily. ADDITIONAL REFILLS FROM PCP 30 tablet 1  . aspirin 81 MG tablet Take 81 mg by mouth every other day.      . clopidogrel (PLAVIX) 75 MG tablet TAKE 1 TABLET BY MOUTH EVERY DAY 90 tablet 1  . furosemide (LASIX) 40 MG tablet Take 80 mg by mouth as directed.     . hydrALAZINE (APRESOLINE) 50 MG tablet Take 1.5 tablets (75 mg total) by mouth 3 (three) times daily. 100 tablet 11  . metoprolol tartrate (LOPRESSOR) 25 MG tablet 1 tablet 2  x daily    . nitroGLYCERIN (NITROSTAT) 0.4 MG SL tablet Place 1 tablet (0.4 mg total) under the tongue every 5 (five) minutes as needed for chest pain (chest pain). 25 tablet 11  . triamcinolone ointment (KENALOG) 0.1 % Apply 1 application topically daily as needed. (Itchy skin)    . isosorbide mononitrate (IMDUR) 60 MG 24 hr tablet Take 1 tablet (60 mg total) by mouth daily. 90 tablet 3  . famotidine (PEPCID) 20 MG tablet Take 1 tablet (20 mg total) by mouth at bedtime. (Patient not taking: Reported on 09/18/2016) 30 tablet 2  . pantoprazole (PROTONIX) 40 MG tablet Take 1 tablet (40 mg total) by mouth daily. (Patient not taking: Reported on 09/18/2016) 30 tablet 3   No facility-administered medications prior to visit.      Allergies:   Gentamycin [gentamicin]; Crestor [rosuvastatin calcium]; Hctz [hydrochlorothiazide]; and Lipitor [atorvastatin calcium]   Social History   Social History  . Marital status: Married    Spouse name: N/A  . Number of children: N/A  . Years of education: N/A   Occupational History  . Retired [employer] Topics  . Smoking status: Never Smoker  . Smokeless tobacco: Never Used  . Alcohol use No  . Drug use: No  . Sexual activity: Not Asked   Other Topics Concern  . None   Social History Narrative  . None     Family History:  The patient's family history includes Coronary artery disease in his mother; Emphysema in his father.   ROS:   Please see the history of present illness.    ROS All other systems reviewed and are negative.   PHYSICAL EXAM:   VS:  BP 140/76   Pulse 73   Ht 5\' 4"  (1.626 m)   Wt 180 lb 12.8 oz (82 kg)   BMI 31.03 kg/m    GEN: Well nourished, well developed, in no acute distress  HEENT: normal  Neck: no JVD, carotid bruits, or masses Cardiac: RRR; no murmurs, rubs, or gallops,no edema  Respiratory:  clear to auscultation bilaterally, normal work of breathing GI: soft, nontender, nondistended, + BS MS:  no deformity or atrophy  Skin: warm and dry, no rash Neuro:  Alert and Oriented x 3, Strength and sensation are intact Psych: euthymic mood, full affect  Wt Readings from Last 3 Encounters:  09/18/16 180 lb 12.8 oz (82 kg)  06/25/16 182 lb 12.8 oz (82.9 kg)  05/14/16 178 lb 2 oz (80.8 kg)      Studies/Labs Reviewed:   EKG:  EKG is ordered today.  The ekg ordered today demonstrates sinus rhythm with RBBB and LAFB, PVC  Recent Labs: 05/14/2016: BUN 97; Creat 4.26; Potassium 4.4; Sodium 142  Lipid Panel    Component Value Date/Time   CHOL 142 08/31/2013 0944   TRIG 42.0 08/31/2013 0944   HDL 39.40 08/31/2013 0944   CHOLHDL 4 08/31/2013 0944   VLDL 8.4 08/31/2013 0944   LDLCALC 94 08/31/2013 0944    Additional studies/ records that were reviewed today include:   Echo 02/27/2013 LV EF: 40% -  45%  Study Conclusions  - Left ventricle: The cavity size was normal. Wall thickness was normal. Systolic function was mildly to moderately reduced. The estimated ejection fraction was in the range of 40% to 45%. Dyskinesis and aneurysmal deformity of the inferior and septal segments of the apex; consistent with infarction in the distribution of the left anterior descending coronary artery. Moderate hypokinesis of the mid-distalanteroseptal and anterior myocardium. There was a possible, flat (mural), fixedthrombus. - Pulmonary arteries: Systolic pressure was mildly increased. PA peak pressure: 27mm Hg (S).    ASSESSMENT:    1. Preop cardiovascular exam   2. Coronary artery disease involving native coronary artery of native heart without angina pectoris   3. Chronic kidney disease (CKD), stage IV (severe) ([place])   4. Essential hypertension   5. RBBB   6. Ischemic cardiomyopathy      PLAN:  In order of problems listed above:  1. Preoperative examination: Patient is going to see a surgeon to set up for peritoneal dialysis. He is still making urine at this  time. Given his poor functional ability, he will be at least a moderate to high risk patient for the intended procedure. Given the inability to to cardiac catheterization, I'm not sure if stress testing would be helpful in this case. I will discuss with Dr. Martinique, however at this time I would not recommend any further workup, I do not think further workup would necessarily lower his risk.  2. CKD stage IV-V: Managed by nephrology in [place]  3. HTN: Blood pressure stable  4. ICM: Baseline EF 40-45% on previous echocardiogram in 2015    Medication Adjustments/Labs and Tests Ordered: Current medicines are reviewed at length with the patient today.  Concerns regarding medicines are outlined above.  Medication changes, Labs and Tests ordered today are listed in the Patient Instructions below. Patient Instructions  Your physician recommends that you schedule a follow-up appointment in: AS SCHEDULED     Signed, Almyra Deforest, [place]  09/20/2016 10:31 AM    [address], [place], [place]  [address] Phone: [phone]; Fax: [phone]

## 2016-09-20 ENCOUNTER — Encounter: Payer: Self-pay | Admitting: Physician Assistant

## 2016-09-24 ENCOUNTER — Ambulatory Visit: Payer: Self-pay | Admitting: Surgery

## 2016-09-24 DIAGNOSIS — N186 End stage renal disease: Secondary | ICD-10-CM | POA: Diagnosis not present

## 2016-09-24 DIAGNOSIS — Z992 Dependence on renal dialysis: Secondary | ICD-10-CM | POA: Diagnosis not present

## 2016-10-01 DIAGNOSIS — Z7409 Other reduced mobility: Secondary | ICD-10-CM | POA: Diagnosis not present

## 2016-10-01 DIAGNOSIS — R69 Illness, unspecified: Secondary | ICD-10-CM | POA: Diagnosis not present

## 2016-10-01 DIAGNOSIS — J399 Disease of upper respiratory tract, unspecified: Secondary | ICD-10-CM | POA: Diagnosis not present

## 2016-10-01 DIAGNOSIS — R296 Repeated falls: Secondary | ICD-10-CM | POA: Diagnosis not present

## 2016-10-06 ENCOUNTER — Telehealth: Payer: Self-pay | Admitting: Cardiology

## 2016-10-06 NOTE — Telephone Encounter (Signed)
Spoke to patient's daughter [name] Dr.Jordan's recommendations given.Spoke to [place] with [facility] surgery [place] cleared patient for upcoming surgery.Advised may hold Plavix 5 days prior.

## 2016-10-06 NOTE — Telephone Encounter (Signed)
New message  Pt daughter call to f/u on surgical clearance. Please call back to discuss

## 2016-10-06 NOTE — Telephone Encounter (Signed)
He is cleared for peritoneal dialysis catheter placement. May hold Plavix 5 days prior.  Smita Lesh Martinique MD, [facility]

## 2016-10-06 NOTE — Telephone Encounter (Signed)
Returned call to patient's daughter [name].Advised Dr.Jordan has been out of office.I spoke to Almyra Deforest PA who your father saw 09/18/16.He wanted Dr.Jordan to clear patient for surgery ( peritoneal dialysis catheter placement ) I will send message to [place] for clearance.

## 2016-10-08 DIAGNOSIS — R809 Proteinuria, unspecified: Secondary | ICD-10-CM | POA: Diagnosis not present

## 2016-10-08 DIAGNOSIS — E559 Vitamin D deficiency, unspecified: Secondary | ICD-10-CM | POA: Diagnosis not present

## 2016-10-08 DIAGNOSIS — I10 Essential (primary) hypertension: Secondary | ICD-10-CM | POA: Diagnosis not present

## 2016-10-08 DIAGNOSIS — D509 Iron deficiency anemia, unspecified: Secondary | ICD-10-CM | POA: Diagnosis not present

## 2016-10-08 DIAGNOSIS — N183 Chronic kidney disease, stage 3 (moderate): Secondary | ICD-10-CM | POA: Diagnosis not present

## 2016-10-08 DIAGNOSIS — Z79899 Other long term (current) drug therapy: Secondary | ICD-10-CM | POA: Diagnosis not present

## 2016-10-09 ENCOUNTER — Ambulatory Visit: Payer: Self-pay | Admitting: Surgery

## 2016-10-14 DIAGNOSIS — N185 Chronic kidney disease, stage 5: Secondary | ICD-10-CM | POA: Diagnosis not present

## 2016-10-14 DIAGNOSIS — D631 Anemia in chronic kidney disease: Secondary | ICD-10-CM | POA: Diagnosis not present

## 2016-10-14 DIAGNOSIS — R269 Unspecified abnormalities of gait and mobility: Secondary | ICD-10-CM | POA: Diagnosis not present

## 2016-10-14 DIAGNOSIS — I10 Essential (primary) hypertension: Secondary | ICD-10-CM | POA: Diagnosis not present

## 2016-10-14 DIAGNOSIS — R296 Repeated falls: Secondary | ICD-10-CM | POA: Diagnosis not present

## 2016-10-14 DIAGNOSIS — M25559 Pain in unspecified hip: Secondary | ICD-10-CM | POA: Diagnosis not present

## 2016-10-14 DIAGNOSIS — T84498A Other mechanical complication of other internal orthopedic devices, implants and grafts, initial encounter: Secondary | ICD-10-CM | POA: Diagnosis not present

## 2016-10-14 DIAGNOSIS — I5089 Other heart failure: Secondary | ICD-10-CM | POA: Diagnosis not present

## 2016-10-14 DIAGNOSIS — R809 Proteinuria, unspecified: Secondary | ICD-10-CM | POA: Diagnosis not present

## 2016-10-14 DIAGNOSIS — N189 Chronic kidney disease, unspecified: Secondary | ICD-10-CM | POA: Diagnosis not present

## 2016-10-14 DIAGNOSIS — C8510 Unspecified B-cell lymphoma, unspecified site: Secondary | ICD-10-CM | POA: Diagnosis not present

## 2016-10-14 DIAGNOSIS — N184 Chronic kidney disease, stage 4 (severe): Secondary | ICD-10-CM | POA: Diagnosis not present

## 2016-10-14 DIAGNOSIS — I509 Heart failure, unspecified: Secondary | ICD-10-CM | POA: Diagnosis not present

## 2016-10-14 DIAGNOSIS — M109 Gout, unspecified: Secondary | ICD-10-CM | POA: Diagnosis not present

## 2016-10-14 DIAGNOSIS — Z7409 Other reduced mobility: Secondary | ICD-10-CM | POA: Diagnosis not present

## 2016-10-14 DIAGNOSIS — Z96649 Presence of unspecified artificial hip joint: Secondary | ICD-10-CM | POA: Diagnosis not present

## 2016-10-24 DIAGNOSIS — R69 Illness, unspecified: Secondary | ICD-10-CM | POA: Diagnosis not present

## 2016-10-24 NOTE — Pre-Procedure Instructions (Signed)
RAHUL MALINAK  10/24/2016      [id number] - [place], [place] - [address] [place] [address] Phone: [phone] Fax: [phone]    Your procedure is scheduled on October 30, 2016.  Report to [facility] Admitting at 800 AM.  Call this number if you have problems the morning of surgery:  [phone]   Remember:  Do not eat food or drink liquids after midnight.  Take these medicines the morning of surgery with A SIP OF WATER albuterol inhaler (bring inhaler with you), allopurinol (zyloprim), metoprolol tartrate (lopressor), eye drops  Stop taking plavix as instructed by your surgeon  7 days prior to surgery STOP taking any Aspirin (unless otherwise instructed by your surgeon), Aleve, Naproxen, Ibuprofen, Motrin, Advil, Goody's, BC's, all herbal medications, fish oil, and all vitamins  Continue all other medications as instructed by your physician except follow the above medication instructions before surgery   Do not wear jewelry, make-up or nail polish.  Do not wear lotions, powders, or colognes, or deoderant.  Men may shave face and neck.  Do not bring valuables to the hospital.  [facility] is not responsible for any belongings or valuables.  Contacts, dentures or bridgework may not be worn into surgery.  Leave your suitcase in the car.  After surgery it may be brought to your room.  For patients admitted to the hospital, discharge time will be determined by your treatment team.  Patients discharged the day of surgery will not be allowed to drive home.    Special instructions:   [place]- Preparing For Surgery  Before surgery, you can play an important role. Because skin is not sterile, your skin needs to be as free of germs as possible. You can reduce the number of germs on your skin by washing with CHG (chlorahexidine gluconate) Soap before surgery.  CHG is an antiseptic cleaner which kills germs and  bonds with the skin to continue killing germs even after washing.  Please do not use if you have an allergy to CHG or antibacterial soaps. If your skin becomes reddened/irritated stop using the CHG.  Do not shave (including legs and underarms) for at least 48 hours prior to first CHG shower. It is OK to shave your face.  Please follow these instructions carefully.   1. Shower the NIGHT BEFORE SURGERY and the MORNING OF SURGERY with CHG.   2. If you chose to wash your hair, wash your hair first as usual with your normal shampoo.  3. After you shampoo, rinse your hair and body thoroughly to remove the shampoo.  4. Use CHG as you would any other liquid soap. You can apply CHG directly to the skin and wash gently with a scrungie or a clean washcloth.   5. Apply the CHG Soap to your body ONLY FROM THE NECK DOWN.  Do not use on open wounds or open sores. Avoid contact with your eyes, ears, mouth and genitals (private parts). Wash genitals (private parts) with your normal soap.  USE REGULAR SHAMPOO AND CONDITIONER FOR HAIR USE REGULAR SOAP FOR FACE AND PRIVATE AREA  6. Wash thoroughly, paying special attention to the area where your surgery will be performed.  7. Thoroughly rinse your body with warm water from the neck down.  8. DO NOT shower/wash with your normal soap after using and rinsing off the CHG Soap.  9. Pat yourself dry with a CLEAN TOWEL and  [place] CLOTH  10. Wear CLEAN PAJAMAS to bed the night before surgery, wear comfortable clothes the morning of surgery  11. Place CLEAN SHEETS on your bed the night of your first shower and DO NOT SLEEP WITH PETS.    Day of Surgery: Do not apply any deodorants/lotions. Please wear clean clothes to the hospital/surgery center.     Please read over the following fact sheets that you were given. Pain Booklet, Coughing and Deep Breathing and Surgical Site Infection Prevention

## 2016-10-27 ENCOUNTER — Encounter (HOSPITAL_COMMUNITY)
Admission: RE | Admit: 2016-10-27 | Discharge: 2016-10-27 | Disposition: A | Discharge status: Home / Self Care | Payer: Medicare HMO | Source: Ambulatory Visit | Attending: Surgery | Admitting: Surgery

## 2016-10-27 ENCOUNTER — Encounter (HOSPITAL_COMMUNITY): Payer: Self-pay

## 2016-10-27 DIAGNOSIS — I252 Old myocardial infarction: Secondary | ICD-10-CM | POA: Diagnosis not present

## 2016-10-27 DIAGNOSIS — I132 Hypertensive heart and chronic kidney disease with heart failure and with stage 5 chronic kidney disease, or end stage renal disease: Secondary | ICD-10-CM | POA: Diagnosis not present

## 2016-10-27 DIAGNOSIS — N186 End stage renal disease: Secondary | ICD-10-CM | POA: Diagnosis present

## 2016-10-27 DIAGNOSIS — I5042 Chronic combined systolic (congestive) and diastolic (congestive) heart failure: Secondary | ICD-10-CM | POA: Diagnosis not present

## 2016-10-27 DIAGNOSIS — Z7982 Long term (current) use of aspirin: Secondary | ICD-10-CM | POA: Diagnosis not present

## 2016-10-27 DIAGNOSIS — J4540 Moderate persistent asthma, uncomplicated: Secondary | ICD-10-CM | POA: Diagnosis not present

## 2016-10-27 DIAGNOSIS — I255 Ischemic cardiomyopathy: Secondary | ICD-10-CM | POA: Diagnosis not present

## 2016-10-27 DIAGNOSIS — Z79899 Other long term (current) drug therapy: Secondary | ICD-10-CM | POA: Diagnosis not present

## 2016-10-27 DIAGNOSIS — Z7902 Long term (current) use of antithrombotics/antiplatelets: Secondary | ICD-10-CM | POA: Diagnosis not present

## 2016-10-27 DIAGNOSIS — Z992 Dependence on renal dialysis: Secondary | ICD-10-CM | POA: Diagnosis not present

## 2016-10-27 DIAGNOSIS — N400 Enlarged prostate without lower urinary tract symptoms: Secondary | ICD-10-CM | POA: Diagnosis not present

## 2016-10-27 DIAGNOSIS — M109 Gout, unspecified: Secondary | ICD-10-CM | POA: Diagnosis not present

## 2016-10-27 DIAGNOSIS — I2510 Atherosclerotic heart disease of native coronary artery without angina pectoris: Secondary | ICD-10-CM | POA: Diagnosis not present

## 2016-10-27 DIAGNOSIS — H532 Diplopia: Secondary | ICD-10-CM | POA: Diagnosis not present

## 2016-10-27 HISTORY — DX: Unspecified staphylococcus as the cause of diseases classified elsewhere: B95.8

## 2016-10-27 HISTORY — DX: Other complications of anesthesia, initial encounter: T88.59XA

## 2016-10-27 HISTORY — DX: Heart failure, unspecified: I50.9

## 2016-10-27 HISTORY — DX: Dyspnea, unspecified: R06.00

## 2016-10-27 HISTORY — DX: Unspecified hearing loss, unspecified ear: H91.90

## 2016-10-27 HISTORY — DX: Adverse effect of unspecified anesthetic, initial encounter: T41.45XA

## 2016-10-27 LAB — BASIC METABOLIC PANEL
Anion gap: 19 — ABNORMAL HIGH (ref 5–15)
BUN: 108 mg/dL — ABNORMAL HIGH (ref 6–20)
CHLORIDE: 88 mmol/L — AB (ref 101–111)
CO2: 27 mmol/L (ref 22–32)
CREATININE: 5.64 mg/dL — AB (ref 0.61–1.24)
Calcium: 9.7 mg/dL (ref 8.9–10.3)
GFR calc Af Amer: 10 mL/min — ABNORMAL LOW (ref >60)
GFR calc non Af Amer: 8 mL/min — ABNORMAL LOW (ref >60)
GLUCOSE: 86 mg/dL (ref 65–99)
POTASSIUM: 3 mmol/L — AB (ref 3.5–5.1)
SODIUM: 134 mmol/L — AB (ref 135–145)

## 2016-10-27 LAB — CBC
HEMATOCRIT: 36.1 % — AB (ref 39.0–52.0)
Hemoglobin: 12.1 g/dL — ABNORMAL LOW (ref 13.0–17.0)
MCH: 30.9 pg (ref 26.0–34.0)
MCHC: 33.5 g/dL (ref 30.0–36.0)
MCV: 92.1 fL (ref 78.0–100.0)
Platelets: 305 10*3/uL (ref 150–400)
RBC: 3.92 MIL/uL — ABNORMAL LOW (ref 4.22–5.81)
RDW: 14.2 % (ref 11.5–15.5)
WBC: 9.4 10*3/uL (ref 4.0–10.5)

## 2016-10-27 NOTE — Progress Notes (Addendum)
Anesthesia PAT Evaluation: Patient is a 83 year old male scheduled for laparoscopic insertion CAPD, omentopexy, laparoscopic abdominal exploration and lysis with possible hernia repairs on 10/30/16 by Dr. Michael Boston. Patient evaluated at 10/27/16 PAT visit.  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 There are anesthesia concerns regarding neck positioning for intubation/positioning (see below).  - PCP is J. Delphina Cahill. - Nephrologist is Dr. Lowanda Foster. Family reports his BUN runs in low 100's, and Cr in 5 range. He recently had to had to limit milk due to Ca/Phos results.  - Cardiologist is Dr. Peter Martinique. He was seen on 09/18/16 by Almyra Deforest, PA on 09/18/16 for follow-up and surgical clearance. He wrote,  "Preoperative examination: Patient is going to see a surgeon to set up for peritoneal dialysis. He is still making urine at this time. Given his poor functional ability, he will be at least a moderate to high risk patient for the intended procedure. Given the inability to to cardiac catheterization, I'm not sure if stress testing would be helpful in this case. I will discuss with Dr. Martinique, however at this time I would not recommend any further workup, I do not think further workup would necessarily lower his risk." On 10/06/16, Dr. Martinique wrote, "He is cleared for peritoneal dialysis catheter placement. May hold Plavix 5 days prior." - Pulmonologist is Dr. Christinia Gully. Referred in 2017 for abnormal chest CT.   - Neurosurgeon is Dr. Glenna Fellows  in [place]. Daughter reports they were told patient would need to be careful with neck positioning due to her c-spine disease (ie, at risk for significant deficit if he were in a car accident.) They were told patient's radiation exposure (~ '70's-80's) may have contributed to his c-spine disease. They do not think that patient had neck imaging within the last five years. (2013 MRI c-spine results outlined below.)  Meds include albuterol nebulizers, allopurinol, ASA 81 mg, Plavix, Lasix, hydralazine, Imdur, metolazone 2.5 mg daily PRN fluid (has taken ~ 3 in past week), Lopressor, Nitro. Patient to hold Plavix X 5 days prior to surgery.   BP 127/73   Pulse 68   Temp 36.4 C   Resp 20   Ht 5\' 3"  (1.6 m)   Wt 177 lb 14.4 oz (80.7 kg)   SpO2 94%   BMI 31.51 kg/m  He is here with wife and daughter. By notes, daughter [name] is a Marine scientist. Exam shows a Caucasian male in NAD, but reports back pain from sitting too long in hospital wheelchair at PAT. Lungs clear but diminished. Heart RRR. Thin skin, arms with ecchymosis. 1+ pretibial edema. Several missing teeth. I did not ask patient to flex/extend neck based on reports of c-spine issues. (Did not locate MRI c-spine until after PAT appointment.)  EKG 09/18/16: SR with first degree AV block, occasional PVCs, right BBB, LAFB, bifascicular block, LVH with repolarization abnormality, anteroseptal infarct (age undetermined).  Echo 02/27/2013: Study Conclusions - Left ventricle: The cavity size was normal. Wall thickness was normal. Systolic function was mildly to moderately reduced. The estimated ejection fraction was in the range of 40%  to 45%. Dyskinesis and aneurysmal deformity of the inferior and septal segments of the apex; consistent with infarction in the distribution of the left anterior descending coronary artery. Moderate hypokinesis of the mid-distalanteroseptal and anterior myocardium. There was a possible, flat (mural),  fixedthrombus. - Pulmonary arteries: Systolic pressure was mildly increased. PA peak pressure: 27mm Hg (S).  Cardiac cath 03/24/07 (Dr. Romeo Apple): IMPRESSION: 1. Totally occluded left anterior descending proximally before the first septal perforating branch. There is collaterals to the left anterior descending predominantly through the septal perforating branches.  2. Mild coronary atherosclerosis in the right coronary artery (30% PDA) and left circumflex (30-40%) with incomplete collateral flow to the left anterior descending.  DISCUSSION:  In light of the reports of the echocardiogram with anterior  wall being nonfunctioning, I think that we are basically dealing with a completed anterior myocardial infarction of relatively significant size. Unless there are recurrent symptoms or objective signs of ischemia, I do not think much will be accomplished with recanalization at this point 5 days out from his myocardial infarction.  MRI C-spine 12/26/11 (Report found in PACS): IMPRESSION: Severe spondylosis and foraminal encroachment bilaterally C2-3 and C3-4. Solid fusion C4-7. Chronic myelomalacia in the spinal cord at C5-6 without spinal stenosis.    PFTs 05/07/15: FVC 1.96 (81%), FEV1 1.13 (70%), DLCO unc 13.86 (68%).   Preoperative labs noted. H/H 12.1/ 36.1. BUN 108, Cr 5.64, consistent with reported trends. (Nephrology records requested, but I do not currently have copies of Dr. Florentina Addison last office note/labs, but last BUN/Cr 97/4.26 on 05/14/16.) K was actually a little low at 3.0. Cl also low at 88 (Lasix and Zaroxolyn likely contributing), although has been low normal in the past six months. Na 134. Calcium 9.7. Glucose 86.   Patient has cardiac clearance from Dr. Martinique. He feels at his baseline from a CV standpoint. I have reviewed patient's neck concerns and 12/2011 MRI c-spine results with anesthesiologist Dr. Suella Broad. It sounds like Dr. Carloyn Manner had expressed some concern with neck  positioning in the past, so will anticipate keeping his neck neutral and using advanced airway equipment such as Glidescope for intubation. I also reviewed labs with anesthesiologist Dr. Annye Asa. Will plan to repeat STAT BMET on the day of surgery to re-evaluate for hypokalemia and hypochloremia and any associated symptoms.   George Hugh [facility] Short Stay Center/Anesthesiology Phone [phone] 10/28/2016 3:27 PM

## 2016-10-27 NOTE — Pre-Procedure Instructions (Signed)
[name]  10/27/2016      [id number] - [place], [place] - [address] [place] [address] Phone: [phone] Fax: [phone]    Your procedure is scheduled on Thursday, October 30, 2016.   Report to [facility] Admitting at 800 AM.             (posted surgery time 10:00a - 1:00p)   Call this number if you have problems the MORNING of surgery:  [phone].   Remember:   Do not eat food or drink liquids after midnight Wednesday.   Take these medicines the morning of surgery with A SIP OF WATER albuterol inhaler (bring inhaler with you), allopurinol (zyloprim), metoprolol tartrate (lopressor), eye drops  Stop taking plavix as instructed by your surgeon____________________________________  7 days prior to surgery STOP taking any Aspirin (unless otherwise instructed by your surgeon), Aleve, Naproxen, Ibuprofen, Motrin, Advil, Goody's, BC's, all herbal medications, fish oil, and all vitamins  Continue all other medications as instructed by your physician except follow the above medication instructions before surgery   Do not wear jewelry - no rings or watches.  Do not wear lotions, colognes, or deoderant.  Men may shave face and neck.   Do not bring valuables to the hospital.  [facility] is not responsible for any belongings or valuables.  Contacts, dentures or bridgework may not be worn into surgery.  Leave your suitcase in the car.  After surgery it may be brought to your room. For patients admitted to the hospital, discharge time will be determined by your treatment team.      Special instructions:   [name]- Preparing For Surgery  Before surgery, you can play an important role. Because skin is not sterile, your skin needs to be as free of germs as possible. You can reduce the number of germs on your skin by washing with CHG (chlorahexidine gluconate) Soap before surgery.  CHG is an antiseptic  cleaner which kills germs and bonds with the skin to continue killing germs even after washing.  Please do not use if you have an allergy to CHG or antibacterial soaps. If your skin becomes reddened/irritated stop using the CHG.  Do not shave (including legs and underarms) for at least 48 hours prior to first CHG shower. It is OK to shave your face.  Please follow these instructions carefully.   1. Shower the NIGHT BEFORE SURGERY and the MORNING OF SURGERY with CHG.   2. If you chose to wash your hair, wash your hair first as usual with your normal shampoo.  3. After you shampoo, rinse your hair and body thoroughly to remove the shampoo.  4. Use CHG as you would any other liquid soap. You can apply CHG directly to the skin and wash gently with a scrungie or a clean washcloth.   5. Apply the CHG Soap to your body ONLY FROM THE NECK DOWN.  Do not use on open wounds or open sores. Avoid contact with your eyes, ears, mouth and genitals (private parts). Wash genitals (private parts) with your normal soap.  USE REGULAR SHAMPOO AND CONDITIONER FOR HAIR USE REGULAR SOAP FOR FACE AND PRIVATE AREA  6. Wash thoroughly, paying special attention to the area where your surgery will be performed.  7. Thoroughly rinse your body with warm water from the neck down.  8. DO NOT shower/wash with your normal soap after using and rinsing off the CHG  Soap.  9. Pat yourself dry with a CLEAN TOWEL and [name] CLOTH  10. Wear CLEAN PAJAMAS to bed the night before surgery, wear comfortable clothes the morning of surgery  11. Place CLEAN SHEETS on your bed the night of your first shower and DO NOT SLEEP WITH PETS.    Day of Surgery: Do not apply any deodorants/lotions. Please wear clean clothes to the hospital/surgery center.     Please read over the following fact sheets that you were given. Pain Booklet, Coughing and Deep Breathing and Surgical Site Infection Prevention

## 2016-10-27 NOTE — Progress Notes (Signed)
   10/27/16 1418  OBSTRUCTIVE SLEEP APNEA  Have you ever been diagnosed with sleep apnea through a sleep study? No  Do you snore loudly (loud enough to be heard through closed doors)?  0  Do you often feel tired, fatigued, or sleepy during the daytime (such as falling asleep during driving or talking to someone)? 1  Has anyone observed you stop breathing during your sleep? 0  Do you have, or are you being treated for high blood pressure? 1  Age > 50 (1-yes) 1  Neck circumference greater than:Male 16 inches or larger, Male 17inches or larger? 1  Male Gender (Yes=1) 1  Obstructive Sleep Apnea Score 5  Score 5 or greater  Results sent to PCP

## 2016-10-27 NOTE — Progress Notes (Signed)
PCP is Dr. Wende Neighbors LOV 09/2016 Cardio is Dr. P Martinique LOV 08/2016  Surgical clearance note from 09/20/2016 Nephrologist is Dr.Befekadu, MD.  Next appt 11/13/2016 Pulmonologist is Dr. 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  1/2 was surgically removed, other half treated with chemo & radiation.  Has titanium plate right side of head and right eye sags. Back in 2009 had MI, seen by Dr. Mare Ferrari then, and has been on plavix ever since.  He has taken his last dose of plavix on 10/5 Dr. Johney Maine said he may continue taking his aspirin. Some time in 2015, patient was brought in for high fever and couldn't breathe, was placed on Gentamycin & Vanco (for extended period of time) and family said that is why his kidneys have deteriorated.  Also, since receiving chemo & radiation (for lymphoma), Dr. Maia Plan, told family that the narrowing of his cord was most likely caused by the radiation, and positioning for any kind surgery will be very important. Heart cath 2009 Stress test 2011 TEE 2011 CXR & EKG  08/2016

## 2016-10-28 ENCOUNTER — Encounter (HOSPITAL_COMMUNITY): Payer: Self-pay

## 2016-10-28 NOTE — Anesthesia Preprocedure Evaluation (Addendum)
Anesthesia Evaluation  Patient identified by MRN, date of birth, ID band Patient awake    Reviewed: Allergy & Precautions, NPO status , Patient's Chart, lab work & pertinent test results, reviewed documented beta blocker date and time   Airway Mallampati: II  TM Distance: >3 FB Neck ROM: Limited    Dental  (+) Teeth Intact, Dental Advisory Given   Pulmonary asthma ,    Pulmonary exam normal breath sounds clear to auscultation       Cardiovascular hypertension, Pt. on medications and Pt. on home beta blockers + CAD, + Past MI, +CHF and + DOE  Normal cardiovascular exam+ dysrhythmias (RBBB)  Rhythm:Regular Rate:Normal  HTN, CAD, MI '09 (anterior with occluded LAD, late presentation, treated medically), ischemic cardiomyopathy, right BBB, CHF  Echo 02/27/2013: Study Conclusions - Left ventricle: The cavity size was normal. Wall thicknesswas normal. Systolic function was mildly to moderatelyreduced. The estimated ejection fraction was in the rangeof 40% to 45%. Dyskinesis and aneurysmal deformity of theinferior and septal segments of the apex; consistent withinfarction in the distribution of the left anteriordescending coronary artery. Moderate hypokinesis of themid-distalanteroseptal and anterior myocardium. There wasa possible, flat (mural), fixedthrombus. - Pulmonary arteries: Systolic pressure was mildly increased. PA peak pressure: 69mm Hg (S).  "Preoperative examination: Patient is going to see a surgeon to set up for peritoneal dialysis. He is still making urine at this time. Given his poor functional ability, he will be at least a moderate to high risk patient for the intended procedure. Given the inability to to cardiac catheterization, I'm not sure if stress testing would be helpful in this case. I will discuss with Dr. Martinique, however at this time I would not recommend any further workup, I do not think further workup would  necessarily lower his risk." On 10/06/16, Dr. Martinique wrote, "He is cleared for peritoneal dialysis catheter placement. May hold Plavix 5 days prior."   Neuro/Psych C4-7 ACDF '01  Neurosurgeon is Dr. Glenna Fellows in [place]. Daughter reports they were told patient would need to be careful with neck positioning due to her c-spine disease (ie, at risk for significant deficit if he were in a car accident.) They were told patient's radiation exposure (~ '70's-80's) may have contributed to his c-spine disease. They do not think that patient had neck imaging within the last five years. 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  Reproductive/Obstetrics                            Anesthesia Physical Anesthesia Plan  ASA: III  Anesthesia Plan: General   Post-op Pain Management:    Induction: Intravenous  PONV Risk Score and Plan: 2 and Ondansetron and Dexamethasone  Airway Management Planned: Video Laryngoscope Planned and Oral ETT  Additional Equipment:   Intra-op Plan:   Post-operative Plan: Extubation in OR  Informed Consent: I have reviewed the patients History and Physical, chart, labs and discussed the procedure including the risks, benefits and alternatives for the proposed anesthesia with the patient or authorized representative who has indicated his/her understanding and  acceptance.   Dental advisory given  Plan Discussed with: CRNA  Anesthesia Plan Comments: (C spine precautions per Neurosurgery Dr. Carloyn Manner)       Anesthesia Quick Evaluation

## 2016-10-30 ENCOUNTER — Encounter (HOSPITAL_COMMUNITY)
Admission: RE | Disposition: A | Discharge status: Home / Self Care | Payer: Self-pay | Source: Ambulatory Visit | Attending: Surgery

## 2016-10-30 ENCOUNTER — Ambulatory Visit (HOSPITAL_COMMUNITY)
Admission: RE | Admit: 2016-10-30 | Discharge: 2016-11-02 | Disposition: A | Discharge status: Home / Self Care | Payer: Medicare HMO | Source: Ambulatory Visit | Attending: Surgery | Admitting: Surgery

## 2016-10-30 ENCOUNTER — Ambulatory Visit (HOSPITAL_COMMUNITY): Payer: Medicare HMO | Admitting: Anesthesiology

## 2016-10-30 ENCOUNTER — Encounter (HOSPITAL_COMMUNITY): Payer: Self-pay

## 2016-10-30 ENCOUNTER — Ambulatory Visit (HOSPITAL_COMMUNITY): Payer: Medicare HMO | Admitting: Vascular Surgery

## 2016-10-30 DIAGNOSIS — I132 Hypertensive heart and chronic kidney disease with heart failure and with stage 5 chronic kidney disease, or end stage renal disease: Secondary | ICD-10-CM | POA: Diagnosis not present

## 2016-10-30 DIAGNOSIS — I2510 Atherosclerotic heart disease of native coronary artery without angina pectoris: Secondary | ICD-10-CM | POA: Diagnosis not present

## 2016-10-30 DIAGNOSIS — R42 Dizziness and giddiness: Secondary | ICD-10-CM

## 2016-10-30 DIAGNOSIS — N400 Enlarged prostate without lower urinary tract symptoms: Secondary | ICD-10-CM | POA: Diagnosis not present

## 2016-10-30 DIAGNOSIS — I255 Ischemic cardiomyopathy: Secondary | ICD-10-CM | POA: Diagnosis present

## 2016-10-30 DIAGNOSIS — Z7902 Long term (current) use of antithrombotics/antiplatelets: Secondary | ICD-10-CM | POA: Insufficient documentation

## 2016-10-30 DIAGNOSIS — Z992 Dependence on renal dialysis: Secondary | ICD-10-CM | POA: Diagnosis not present

## 2016-10-30 DIAGNOSIS — J4540 Moderate persistent asthma, uncomplicated: Secondary | ICD-10-CM | POA: Diagnosis present

## 2016-10-30 DIAGNOSIS — I252 Old myocardial infarction: Secondary | ICD-10-CM | POA: Diagnosis not present

## 2016-10-30 DIAGNOSIS — I5043 Acute on chronic combined systolic (congestive) and diastolic (congestive) heart failure: Secondary | ICD-10-CM | POA: Diagnosis present

## 2016-10-30 DIAGNOSIS — Z7982 Long term (current) use of aspirin: Secondary | ICD-10-CM | POA: Insufficient documentation

## 2016-10-30 DIAGNOSIS — N186 End stage renal disease: Secondary | ICD-10-CM | POA: Insufficient documentation

## 2016-10-30 DIAGNOSIS — H532 Diplopia: Secondary | ICD-10-CM | POA: Insufficient documentation

## 2016-10-30 DIAGNOSIS — M109 Gout, unspecified: Secondary | ICD-10-CM | POA: Insufficient documentation

## 2016-10-30 DIAGNOSIS — I5042 Chronic combined systolic (congestive) and diastolic (congestive) heart failure: Secondary | ICD-10-CM | POA: Insufficient documentation

## 2016-10-30 DIAGNOSIS — N185 Chronic kidney disease, stage 5: Secondary | ICD-10-CM | POA: Diagnosis present

## 2016-10-30 DIAGNOSIS — R0602 Shortness of breath: Secondary | ICD-10-CM

## 2016-10-30 DIAGNOSIS — R001 Bradycardia, unspecified: Secondary | ICD-10-CM

## 2016-10-30 DIAGNOSIS — I952 Hypotension due to drugs: Secondary | ICD-10-CM

## 2016-10-30 DIAGNOSIS — Z79899 Other long term (current) drug therapy: Secondary | ICD-10-CM | POA: Insufficient documentation

## 2016-10-30 HISTORY — DX: Influenza due to other identified influenza virus with other respiratory manifestations: J10.1

## 2016-10-30 HISTORY — DX: Atherosclerotic heart disease of native coronary artery without angina pectoris: I25.10

## 2016-10-30 HISTORY — DX: Pneumonia, unspecified organism: J18.9

## 2016-10-30 HISTORY — PX: INSERTION OF DIALYSIS CATHETER: SHX1324

## 2016-10-30 HISTORY — DX: Non-ST elevation (NSTEMI) myocardial infarction: I21.4

## 2016-10-30 HISTORY — DX: Acute respiratory failure with hypoxia: J96.01

## 2016-10-30 HISTORY — PX: CAPD INSERTION: SHX5233

## 2016-10-30 LAB — BASIC METABOLIC PANEL
Anion gap: 19 — ABNORMAL HIGH (ref 5–15)
BUN: 118 mg/dL — AB (ref 6–20)
CALCIUM: 9.8 mg/dL (ref 8.9–10.3)
CO2: 29 mmol/L (ref 22–32)
CREATININE: 5.64 mg/dL — AB (ref 0.61–1.24)
Chloride: 88 mmol/L — ABNORMAL LOW (ref 101–111)
GFR calc non Af Amer: 8 mL/min — ABNORMAL LOW (ref >60)
GFR, EST AFRICAN AMERICAN: 10 mL/min — AB (ref >60)
GLUCOSE: 210 mg/dL — AB (ref 65–99)
Potassium: 2.5 mmol/L — CL (ref 3.5–5.1)
Sodium: 136 mmol/L (ref 135–145)

## 2016-10-30 SURGERY — LAPAROSCOPIC INSERTION CONTINUOUS AMBULATORY PERITONEAL DIALYSIS  (CAPD) CATHETER
Anesthesia: General | Site: Abdomen

## 2016-10-30 MED ORDER — DIPHENHYDRAMINE HCL 12.5 MG/5ML PO ELIX: 12.5000 mg | ORAL_SOLUTION | Freq: Four times a day (QID) | ORAL | Status: DC | PRN

## 2016-10-30 MED ORDER — ENOXAPARIN SODIUM 30 MG/0.3ML ~~LOC~~ SOLN
30.0000 mg | SUBCUTANEOUS | Status: DC
  Administered 2016-10-31 – 2016-11-02 (×3): 30 mg via SUBCUTANEOUS
  Filled 2016-10-30 (×3): qty 0.3

## 2016-10-30 MED ORDER — ASPIRIN 81 MG PO CHEW
81.0000 mg | CHEWABLE_TABLET | ORAL | Status: DC
  Administered 2016-10-31 – 2016-11-02 (×2): 81 mg via ORAL
  Filled 2016-10-30 (×2): qty 1

## 2016-10-30 MED ORDER — ACETAMINOPHEN 500 MG PO TABS
1000.0000 mg | ORAL_TABLET | Freq: Three times a day (TID) | ORAL | Status: DC
  Administered 2016-10-30 – 2016-11-02 (×8): 1000 mg via ORAL
  Filled 2016-10-30 (×9): qty 2

## 2016-10-30 MED ORDER — MAGIC MOUTHWASH: 15.0000 mL | Freq: Four times a day (QID) | ORAL | Status: DC | PRN

## 2016-10-30 MED ORDER — HYDRALAZINE HCL 20 MG/ML IJ SOLN: 5.0000 mg | INTRAMUSCULAR | Status: DC | PRN

## 2016-10-30 MED ORDER — FENTANYL CITRATE (PF) 100 MCG/2ML IJ SOLN: 25.0000 ug | INTRAMUSCULAR | Status: DC | PRN

## 2016-10-30 MED ORDER — PROPYLENE GLYCOL-GLYCERIN 0.6-0.6 % OP SOLN: 1.0000 [drp] | OPHTHALMIC | Status: DC | PRN

## 2016-10-30 MED ORDER — TRAMADOL HCL 50 MG PO TABS: 50.0000 mg | ORAL_TABLET | Freq: Four times a day (QID) | ORAL | Status: DC | PRN

## 2016-10-30 MED ORDER — LACTATED RINGERS IV SOLN: 1000.0000 mL | Freq: Three times a day (TID) | INTRAVENOUS | Status: AC | PRN

## 2016-10-30 MED ORDER — SODIUM CHLORIDE 0.9 % IV SOLN
INTRAVENOUS | Status: DC | PRN
  Administered 2016-10-30: 500 mL

## 2016-10-30 MED ORDER — ALBUMIN HUMAN 5 % IV SOLN
12.5000 g | Freq: Once | INTRAVENOUS | Status: AC
  Administered 2016-10-30: 12.5 g via INTRAVENOUS

## 2016-10-30 MED ORDER — NITROGLYCERIN 0.4 MG SL SUBL: 0.4000 mg | SUBLINGUAL_TABLET | SUBLINGUAL | Status: DC | PRN

## 2016-10-30 MED ORDER — SODIUM CHLORIDE 0.9 % IV SOLN
INTRAVENOUS | Status: DC | PRN
  Administered 2016-10-30: 11:00:00 via INTRAVENOUS

## 2016-10-30 MED ORDER — BUPIVACAINE-EPINEPHRINE (PF) 0.25% -1:200000 IJ SOLN
INTRAMUSCULAR | Status: AC
  Filled 2016-10-30: qty 60

## 2016-10-30 MED ORDER — CISATRACURIUM BESYLATE (PF) 10 MG/5ML IV SOLN
INTRAVENOUS | Status: DC | PRN
  Administered 2016-10-30: 18 mg via INTRAVENOUS

## 2016-10-30 MED ORDER — ONDANSETRON HCL 4 MG/2ML IJ SOLN
INTRAMUSCULAR | Status: AC
  Filled 2016-10-30: qty 2

## 2016-10-30 MED ORDER — ALLOPURINOL 100 MG PO TABS
50.0000 mg | ORAL_TABLET | Freq: Every day | ORAL | Status: DC
  Administered 2016-10-31 – 2016-11-02 (×3): 50 mg via ORAL
  Filled 2016-10-30 (×3): qty 1

## 2016-10-30 MED ORDER — LIDOCAINE 2% (20 MG/ML) 5 ML SYRINGE
INTRAMUSCULAR | Status: DC | PRN
  Administered 2016-10-30: 100 mg via INTRAVENOUS

## 2016-10-30 MED ORDER — SIMETHICONE 80 MG PO CHEW: 40.0000 mg | CHEWABLE_TABLET | Freq: Four times a day (QID) | ORAL | Status: DC | PRN

## 2016-10-30 MED ORDER — FENTANYL CITRATE (PF) 250 MCG/5ML IJ SOLN
INTRAMUSCULAR | Status: AC
  Filled 2016-10-30: qty 5

## 2016-10-30 MED ORDER — METOLAZONE 2.5 MG PO TABS: 2.5000 mg | ORAL_TABLET | Freq: Every day | ORAL | Status: DC | PRN

## 2016-10-30 MED ORDER — DEXTROSE 5 % IV SOLN: INTRAVENOUS | Status: DC | PRN

## 2016-10-30 MED ORDER — LACTATED RINGERS IV BOLUS (SEPSIS): 1000.0000 mL | Freq: Three times a day (TID) | INTRAVENOUS | Status: AC | PRN

## 2016-10-30 MED ORDER — DIPHENHYDRAMINE HCL 50 MG/ML IJ SOLN: 12.5000 mg | Freq: Four times a day (QID) | INTRAMUSCULAR | Status: DC | PRN

## 2016-10-30 MED ORDER — CHLORHEXIDINE GLUCONATE CLOTH 2 % EX PADS: 6.0000 | MEDICATED_PAD | Freq: Once | CUTANEOUS | Status: DC

## 2016-10-30 MED ORDER — CEFAZOLIN SODIUM-DEXTROSE 2-4 GM/100ML-% IV SOLN
2.0000 g | INTRAVENOUS | Status: AC
  Administered 2016-10-30: 2 g via INTRAVENOUS
  Filled 2016-10-30: qty 100

## 2016-10-30 MED ORDER — FUROSEMIDE 80 MG PO TABS
80.0000 mg | ORAL_TABLET | Freq: Every day | ORAL | Status: DC
  Administered 2016-10-31: 80 mg via ORAL
  Filled 2016-10-30: qty 1

## 2016-10-30 MED ORDER — ONDANSETRON HCL 4 MG/2ML IJ SOLN
INTRAMUSCULAR | Status: DC | PRN
  Administered 2016-10-30: 4 mg via INTRAVENOUS

## 2016-10-30 MED ORDER — ALBUTEROL SULFATE (2.5 MG/3ML) 0.083% IN NEBU: 2.5000 mg | INHALATION_SOLUTION | Freq: Four times a day (QID) | RESPIRATORY_TRACT | Status: DC | PRN

## 2016-10-30 MED ORDER — FENTANYL CITRATE (PF) 100 MCG/2ML IJ SOLN
INTRAMUSCULAR | Status: DC | PRN
  Administered 2016-10-30: 100 ug via INTRAVENOUS

## 2016-10-30 MED ORDER — BISACODYL 10 MG RE SUPP: 10.0000 mg | Freq: Every day | RECTAL | Status: DC | PRN

## 2016-10-30 MED ORDER — POLYVINYL ALCOHOL 1.4 % OP SOLN: 1.0000 [drp] | OPHTHALMIC | Status: DC | PRN

## 2016-10-30 MED ORDER — ASPIRIN 81 MG PO TABS: 81.0000 mg | ORAL_TABLET | ORAL | Status: DC

## 2016-10-30 MED ORDER — TRAMADOL HCL 50 MG PO TABS: 50.0000 mg | ORAL_TABLET | Freq: Four times a day (QID) | ORAL | 0 refills | Status: DC | PRN

## 2016-10-30 MED ORDER — ALBUMIN HUMAN 5 % IV SOLN
INTRAVENOUS | Status: AC
  Filled 2016-10-30: qty 250

## 2016-10-30 MED ORDER — METOPROLOL TARTRATE 25 MG PO TABS
25.0000 mg | ORAL_TABLET | Freq: Two times a day (BID) | ORAL | Status: DC
  Administered 2016-10-30 – 2016-10-31 (×2): 25 mg via ORAL
  Filled 2016-10-30 (×2): qty 1

## 2016-10-30 MED ORDER — PROPOFOL 10 MG/ML IV BOLUS
INTRAVENOUS | Status: AC
  Filled 2016-10-30: qty 20

## 2016-10-30 MED ORDER — METHOCARBAMOL 500 MG PO TABS
500.0000 mg | ORAL_TABLET | Freq: Four times a day (QID) | ORAL | Status: DC | PRN
  Administered 2016-10-30: 500 mg via ORAL
  Filled 2016-10-30: qty 1

## 2016-10-30 MED ORDER — POTASSIUM CHLORIDE 10 MEQ/100ML IV SOLN
10.0000 meq | Freq: Once | INTRAVENOUS | Status: AC
  Administered 2016-10-30: 10 meq via INTRAVENOUS
  Filled 2016-10-30: qty 100

## 2016-10-30 MED ORDER — SODIUM CHLORIDE 0.9 % IR SOLN
Status: DC | PRN
  Administered 2016-10-30: 1000 mL

## 2016-10-30 MED ORDER — BLISTEX MEDICATED EX OINT
1.0000 "application " | TOPICAL_OINTMENT | Freq: Two times a day (BID) | CUTANEOUS | Status: DC
  Administered 2016-10-31 – 2016-11-02 (×5): 1 via TOPICAL
  Filled 2016-10-30: qty 6.3
  Filled 2016-10-30: qty 7
  Filled 2016-10-30: qty 6.3

## 2016-10-30 MED ORDER — EPHEDRINE SULFATE-NACL 50-0.9 MG/10ML-% IV SOSY
PREFILLED_SYRINGE | INTRAVENOUS | Status: DC | PRN
Start: 2016-10-30 — End: 2016-10-30
  Administered 2016-10-30: 20 mg via INTRAVENOUS

## 2016-10-30 MED ORDER — SODIUM CHLORIDE 0.9% FLUSH
3.0000 mL | Freq: Two times a day (BID) | INTRAVENOUS | Status: DC
  Administered 2016-10-31 – 2016-11-01 (×3): 3 mL via INTRAVENOUS

## 2016-10-30 MED ORDER — ACETAMINOPHEN 500 MG PO TABS
1000.0000 mg | ORAL_TABLET | ORAL | Status: AC
  Administered 2016-10-30: 1000 mg via ORAL
  Filled 2016-10-30: qty 2

## 2016-10-30 MED ORDER — ONDANSETRON 4 MG PO TBDP: 4.0000 mg | ORAL_TABLET | Freq: Four times a day (QID) | ORAL | Status: DC | PRN

## 2016-10-30 MED ORDER — GABAPENTIN 300 MG PO CAPS
300.0000 mg | ORAL_CAPSULE | ORAL | Status: AC
  Administered 2016-10-30: 300 mg via ORAL
  Filled 2016-10-30: qty 1

## 2016-10-30 MED ORDER — HEPARIN SOD (PORK) LOCK FLUSH 100 UNIT/ML IV SOLN
INTRAVENOUS | Status: AC
  Filled 2016-10-30: qty 5

## 2016-10-30 MED ORDER — SODIUM CHLORIDE 0.9 % IV SOLN
INTRAVENOUS | Status: DC
  Administered 2016-10-30: 16:00:00 via INTRAVENOUS

## 2016-10-30 MED ORDER — POLYETHYLENE GLYCOL 3350 17 G PO PACK: 17.0000 g | PACK | Freq: Two times a day (BID) | ORAL | Status: DC | PRN

## 2016-10-30 MED ORDER — ISOSORBIDE MONONITRATE ER 60 MG PO TB24
60.0000 mg | ORAL_TABLET | Freq: Every day | ORAL | Status: DC
  Administered 2016-10-31: 60 mg via ORAL
  Filled 2016-10-30: qty 1

## 2016-10-30 MED ORDER — PHENYLEPHRINE 40 MCG/ML (10ML) SYRINGE FOR IV PUSH (FOR BLOOD PRESSURE SUPPORT)
PREFILLED_SYRINGE | INTRAVENOUS | Status: DC | PRN
  Administered 2016-10-30: 160 ug via INTRAVENOUS

## 2016-10-30 MED ORDER — 0.9 % SODIUM CHLORIDE (POUR BTL) OPTIME
TOPICAL | Status: DC | PRN
  Administered 2016-10-30: 1000 mL

## 2016-10-30 MED ORDER — DEXTROSE 5 % IV SOLN
INTRAVENOUS | Status: DC | PRN
  Administered 2016-10-30: 40 ug/min via INTRAVENOUS

## 2016-10-30 MED ORDER — HYDRALAZINE HCL 50 MG PO TABS
75.0000 mg | ORAL_TABLET | Freq: Three times a day (TID) | ORAL | Status: DC
  Administered 2016-10-31: 75 mg via ORAL
  Filled 2016-10-30: qty 1

## 2016-10-30 MED ORDER — NEOSTIGMINE METHYLSULFATE 10 MG/10ML IV SOLN
INTRAVENOUS | Status: DC | PRN
  Administered 2016-10-30: 4 mg via INTRAVENOUS

## 2016-10-30 MED ORDER — HEPARIN SOD (PORK) LOCK FLUSH 100 UNIT/ML IV SOLN
INTRAVENOUS | Status: DC | PRN
  Administered 2016-10-30: 500 [IU]

## 2016-10-30 MED ORDER — ONDANSETRON HCL 4 MG/2ML IJ SOLN: 4.0000 mg | Freq: Four times a day (QID) | INTRAMUSCULAR | Status: DC | PRN

## 2016-10-30 MED ORDER — SODIUM CHLORIDE 0.9 % IV SOLN: 250.0000 mL | INTRAVENOUS | Status: DC | PRN

## 2016-10-30 MED ORDER — BUPIVACAINE-EPINEPHRINE 0.25% -1:200000 IJ SOLN
INTRAMUSCULAR | Status: DC | PRN
  Administered 2016-10-30: 11 mL

## 2016-10-30 MED ORDER — PROPOFOL 10 MG/ML IV BOLUS
INTRAVENOUS | Status: DC | PRN
  Administered 2016-10-30: 150 mg via INTRAVENOUS

## 2016-10-30 MED ORDER — DEXAMETHASONE SODIUM PHOSPHATE 10 MG/ML IJ SOLN
INTRAMUSCULAR | Status: AC
  Filled 2016-10-30: qty 1

## 2016-10-30 MED ORDER — GLYCOPYRROLATE 0.2 MG/ML IJ SOLN
INTRAMUSCULAR | Status: DC | PRN
  Administered 2016-10-30: .8 mg via INTRAVENOUS

## 2016-10-30 MED ORDER — SODIUM CHLORIDE 0.9% FLUSH: 3.0000 mL | INTRAVENOUS | Status: DC | PRN

## 2016-10-30 SURGICAL SUPPLY — 52 items
ADAPTER TITANIUM MEDIONICS (MISCELLANEOUS) ×3 IMPLANT
ADPR DLYS CATH STRL LF DISP (MISCELLANEOUS) ×2
BAG DECANTER FOR FLEXI CONT (MISCELLANEOUS) ×4 IMPLANT
BLADE CLIPPER SURG (BLADE) ×3 IMPLANT
CANISTER SUCT 3000ML PPV (MISCELLANEOUS) ×3 IMPLANT
CATH EXTENDED DIALYSIS (CATHETERS) ×3 IMPLANT
CHLORAPREP W/TINT 26ML (MISCELLANEOUS) ×4 IMPLANT
COVER SURGICAL LIGHT HANDLE (MISCELLANEOUS) ×4 IMPLANT
DECANTER SPIKE VIAL GLASS SM (MISCELLANEOUS) ×4 IMPLANT
DEVICE TROCAR PUNCTURE CLOSURE (ENDOMECHANICALS) ×4 IMPLANT
DRAPE INCISE IOBAN 66X45 STRL (DRAPES) ×4 IMPLANT
DRAPE WARM FLUID 44X44 (DRAPE) ×4 IMPLANT
DRSG TEGADERM 2-3/8X2-3/4 SM (GAUZE/BANDAGES/DRESSINGS) ×21 IMPLANT
DRSG TEGADERM 4X4.75 (GAUZE/BANDAGES/DRESSINGS) ×3 IMPLANT
ELECT REM PT RETURN 9FT ADLT (ELECTROSURGICAL) ×4
ELECTRODE REM PT RTRN 9FT ADLT (ELECTROSURGICAL) ×2 IMPLANT
GAUZE SPONGE 2X2 8PLY STRL LF (GAUZE/BANDAGES/DRESSINGS) ×1 IMPLANT
GAUZE SPONGE 4X4 12PLY STRL (GAUZE/BANDAGES/DRESSINGS) ×3 IMPLANT
GLOVE BIOGEL PI IND STRL 6.5 (GLOVE) ×1 IMPLANT
GLOVE BIOGEL PI IND STRL 7.0 (GLOVE) ×1 IMPLANT
GLOVE BIOGEL PI IND STRL 8 (GLOVE) ×2 IMPLANT
GLOVE BIOGEL PI INDICATOR 6.5 (GLOVE) ×2
GLOVE BIOGEL PI INDICATOR 7.0 (GLOVE) ×2
GLOVE BIOGEL PI INDICATOR 8 (GLOVE) ×2
GLOVE ECLIPSE 6.0 STRL STRAW (GLOVE) ×3 IMPLANT
GLOVE ECLIPSE 7.0 STRL STRAW (GLOVE) ×3 IMPLANT
GLOVE ECLIPSE 8.0 STRL XLNG CF (GLOVE) ×4 IMPLANT
GOWN STRL REUS W/ TWL LRG LVL3 (GOWN DISPOSABLE) ×4 IMPLANT
GOWN STRL REUS W/ TWL XL LVL3 (GOWN DISPOSABLE) ×2 IMPLANT
GOWN STRL REUS W/TWL LRG LVL3 (GOWN DISPOSABLE) ×8
GOWN STRL REUS W/TWL XL LVL3 (GOWN DISPOSABLE) ×4
KIT BASIN OR (CUSTOM PROCEDURE TRAY) ×4 IMPLANT
KIT ROOM TURNOVER OR (KITS) ×4 IMPLANT
NS IRRIG 1000ML POUR BTL (IV SOLUTION) ×4 IMPLANT
PAD ARMBOARD 7.5X6 YLW CONV (MISCELLANEOUS) ×8 IMPLANT
SCISSORS LAP 5X35 DISP (ENDOMECHANICALS) ×3 IMPLANT
SET EXT 12IN DIALYSIS STAY-SAF (MISCELLANEOUS) ×4 IMPLANT
SET IRRIG TUBING LAPAROSCOPIC (IRRIGATION / IRRIGATOR) ×3 IMPLANT
SLEEVE ENDOPATH XCEL 5M (ENDOMECHANICALS) ×4 IMPLANT
SPONGE GAUZE 2X2 STER 10/PKG (GAUZE/BANDAGES/DRESSINGS) ×2
STYLET FALLER (MISCELLANEOUS) ×3 IMPLANT
STYLET FALLER MEDIONICS (MISCELLANEOUS) ×3 IMPLANT
SUT MNCRL AB 4-0 PS2 18 (SUTURE) ×4 IMPLANT
SUT PDS AB 1 CT  36 (SUTURE) ×4
SUT PDS AB 1 CT 36 (SUTURE) ×4 IMPLANT
SUT PROLENE 2 0 CT2 30 (SUTURE) ×15 IMPLANT
SYR 50ML LL SCALE MARK (SYRINGE) ×4 IMPLANT
TOWEL OR 17X24 6PK STRL BLUE (TOWEL DISPOSABLE) ×4 IMPLANT
TRAY LAPAROSCOPIC MC (CUSTOM PROCEDURE TRAY) ×4 IMPLANT
TROCAR 5MMX150MM (TROCAR) ×7 IMPLANT
TROCAR XCEL NON-BLD 5MMX100MML (ENDOMECHANICALS) ×4 IMPLANT
TUBING INSUFFLATION (TUBING) ×4 IMPLANT

## 2016-10-30 NOTE — H&P (Signed)
[name]  Location: [facility] Patient #: [id number] DOB: 21-May-1928 Married / Language: English / Race: White Male  Patient Care Team: Celene Squibb, MD as PCP - General (Internal Medicine) Fran Lowes, MD as Consulting Physician (Nephrology) [name], Matthew M, MD as Consulting Physician (Cardiology) Matthew Fellows, MD as Attending Physician (Neurosurgery) Matthew Arabian, MD as Consulting Physician (Orthopedic Surgery)   History of Present Illness Matthew Hector MD; 09/26/2016 3:19 PM) The patient is a 83 year old male who presents with a complaint of chronic kidney disease. Request for peritoneal dialysis. ` ` ` Patient sent for surgical consultation at the request of Dr. Lowanda Foster, Nephrology  Chief Complaint: Worsening chronic kidney disease. Desire for peritoneal dialysis catheter  The patient is an 83 year old gentleman. Worsening renal function. Followed by nephrology. Consultation requested for placement of peritoneal dialysis catheter. He sits in a wheelchair. He has significant cardiac disease. History of prior myocardial infarction in 2009. Treated medically. On dual antiplatelet therapy. Not a candidate for coronary angiography. Chronic heart failure. Ejection fraction 45% by echocardiogram 2015. On chronic high-dose Lasix.   He comes today with his wife who is a retired Nurse, children's. They live with their daughter that is also a Marine scientist. Her other daughter from [place] is here.  Comes in a wheelchair he is slow to stand up and move around. He has some decreased cardiac function but not horribly severe. Heart failure seems stable. He would like to avoid hemodialysis of possible. Family wishes to try peritoneal dialysis. They have met with the peritoneal dialysis nurse. Home safety check noted. There are hoping for the overnight fluid exchanges.  (Review of systems as stated in this history (HPI) or in the review of systems. Otherwise all  other 12 point ROS are negative)   Past Surgical History [name], RMA; 09/24/2016 3:44 PM) Hip Surgery  Right. Knee Surgery  Left. Spinal Surgery - Lower Back  Spinal Surgery - Neck  Spinal Surgery Midback   Diagnostic Studies History [name], [place]; 09/24/2016 3:44 PM) Colonoscopy  never  Medication History [name], RMA; 09/24/2016 3:45 PM) Isosorbide Mononitrate ER (60MG Tablet ER 24HR, Oral) Active. Nitroglycerin (0.4MG Tab Sublingual, Sublingual) Active. Metoprolol Tartrate (25MG Tablet, Oral) Active. Allopurinol (100MG Tablet, Oral) Active. Clopidogrel Bisulfate (75MG Tablet, Oral) Active. HydrALAZINE HCl (50MG Tablet, Oral) Active. Furosemide (40MG Tablet, Oral) Active. Calcitriol (0.5MCG Capsule, Oral) Active. Medications Reconciled  Social History [name], [place]; 09/24/2016 3:44 PM) Caffeine use  Coffee, Tea. No alcohol use  No drug use  Tobacco use  Never smoker.  Family History [name], [place]; 09/24/2016 3:44 PM) Cerebrovascular Accident  Sister. Diabetes Mellitus  Mother, Sister. Heart Disease  Mother. Heart disease in male family member before age 65   Other Problems [name], [place]; 09/24/2016 3:44 PM) Cancer  Chronic Renal Failure Syndrome  High blood pressure  Myocardial infarction     Review of Systems [name] RMA; 09/24/2016 3:44 PM) General Not Present- Appetite Loss, Chills, Fatigue, Fever, Night Sweats, Weight Gain and Weight Loss. Skin Not Present- Change in Wart/Mole, Dryness, Hives, Jaundice, New Lesions, Non-Healing Wounds, Rash and Ulcer. HEENT Present- Hearing Loss and Wears glasses/contact lenses. Not Present- Earache, Hoarseness, Nose Bleed, Oral Ulcers, Ringing in the Ears, Seasonal Allergies, Sinus Pain, Sore Throat, Visual Disturbances and Yellow Eyes. Breast Not Present- Breast Mass, Breast Pain, Nipple Discharge and Skin Changes. Cardiovascular Present-  Swelling of Extremities. Not Present- Chest Pain, Difficulty Breathing Lying Down, Leg Cramps, Palpitations, Rapid Heart  Rate and Shortness of Breath. Gastrointestinal Not Present- Abdominal Pain, Bloating, Bloody Stool, Change in Bowel Habits, Chronic diarrhea, Constipation, Difficulty Swallowing, Excessive gas, Gets full quickly at meals, Hemorrhoids, Indigestion, Nausea, Rectal Pain and Vomiting. Male Genitourinary Present- Impotence. Not Present- Blood in Urine, Change in Urinary Stream, Frequency, Nocturia, Painful Urination, Urgency and Urine Leakage. Musculoskeletal Not Present- Back Pain, Joint Pain, Joint Stiffness, Muscle Pain, Muscle Weakness and Swelling of Extremities. Neurological Not Present- Decreased Memory, Fainting, Headaches, Numbness, Seizures, Tingling, Tremor, Trouble walking and Weakness. Psychiatric Not Present- Anxiety, Bipolar, Change in Sleep Pattern, Depression, Fearful and Frequent crying. Endocrine Not Present- Cold Intolerance, Excessive Hunger, Hair Changes, Heat Intolerance, Hot flashes and New Diabetes. Hematology Present- Blood Thinners and Easy Bruising. Not Present- Excessive bleeding, Gland problems, HIV and Persistent Infections.  Prescriptions Prior to Admission  Medication Sig Dispense Refill Last Dose  . albuterol (PROVENTIL) (2.5 MG/3ML) 0.083% nebulizer solution INHALE EVERY 4-6HRS AS NEEDED FOR WHEEZING/SHORTNESS OF BREATH  2 10/30/2016 at 0615  . allopurinol (ZYLOPRIM) 100 MG tablet Take one half tablet by mouth once daily. ADDITIONAL REFILLS FROM PCP (Patient taking differently: Take 50 mg by mouth daily. ) 30 tablet 1 10/30/2016 at 0615  . aspirin 81 MG tablet Take 81 mg by mouth every other day.     10/29/2016 at Unknown time  . clopidogrel (PLAVIX) 75 MG tablet TAKE 1 TABLET BY MOUTH EVERY DAY (Patient taking differently: TAKE 75 MG BY MOUTH EVERY DAY) 90 tablet 1 10/24/2016  . furosemide (LASIX) 80 MG tablet Take 80 mg by mouth daily.    10/29/2016 at  Unknown time  . hydrALAZINE (APRESOLINE) 50 MG tablet Take 1.5 tablets (75 mg total) by mouth 3 (three) times daily. 100 tablet 11 10/29/2016 at Unknown time  . isosorbide mononitrate (IMDUR) 60 MG 24 hr tablet Take 1 tablet (60 mg total) by mouth daily. 90 tablet 3 Taking  . metolazone (ZAROXOLYN) 2.5 MG tablet Take 2.5 mg by mouth daily as needed (for fluid).   Past Week at Unknown time  . metoprolol tartrate (LOPRESSOR) 25 MG tablet Take 25 mg by mouth 2 (two) times daily.    10/30/2016 at 0615  . Propylene Glycol-Glycerin (SOOTHE OP) Place 1 drop into both eyes as needed (for dry eyes).   Past Month at Unknown time  . triamcinolone ointment (KENALOG) 0.1 % Apply 1 application topically daily as needed (for itchy skin).    Past Month at Unknown time  . nitroGLYCERIN (NITROSTAT) 0.4 MG SL tablet Place 1 tablet (0.4 mg total) under the tongue every 5 (five) minutes as needed for chest pain (chest pain). 25 tablet 11 More than a month at Unknown time     Vitals [name] RMA; 09/24/2016 3:46 PM) 09/24/2016 3:45 PM Weight: 182 lb Height: 64in Body Surface Area: 1.88 [name] Body Mass Index: 31.24 kg/[name]  Temp.: 97.37F  Pulse: 75 (Regular)  BP: 140/80 (Sitting, Left Arm, Standard)  BP (!) 137/59   Pulse 72   Temp (!) 97.5 F (36.4 C) (Oral)   Resp 19   Ht _0  (1.6 [name])   Wt 80.7 kg (177 lb 14.4 oz)   SpO2 97%   BMI 31.51 kg/[name]       Physical Exam Matthew Hector MD; 09/26/2016 3:19 PM) General Mental Status-Alert. General Appearance-Not in acute distress, Not Sickly. Orientation-Oriented X3. Hydration-Well hydrated. Voice-Normal. Note: Sits in a wheelchair. Can stand with some partial assist   Integumentary Global Assessment Upon inspection and palpation of  skin surfaces of the - Axillae: non-tender, no inflammation or ulceration, no drainage. and Distribution of scalp and body hair is normal. General Characteristics Temperature - normal warmth is  noted.  Head and Neck Head-normocephalic, atraumatic with no lesions or palpable masses. Face Global Assessment - atraumatic, no absence of expression. Neck Global Assessment - no abnormal movements, no bruit auscultated on the right, no bruit auscultated on the left, no decreased range of motion, non-tender. Trachea-midline. Thyroid Gland Characteristics - non-tender.  Eye Eyeball - Left-Extraocular movements intact, No Nystagmus. Eyeball - Right-Extraocular movements intact, No Nystagmus. Cornea - Left-No Hazy. Cornea - Right-No Hazy. Sclera/Conjunctiva - Left-No scleral icterus, No Discharge. Sclera/Conjunctiva - Right-No scleral icterus, No Discharge. Pupil - Left-Direct reaction to light normal. Pupil - Right-Direct reaction to light normal.  ENMT Ears Pinna - Left - no drainage observed, no generalized tenderness observed. Right - no drainage observed, no generalized tenderness observed. Nose and Sinuses External Inspection of the Nose - no destructive lesion observed. Inspection of the nares - Left - quiet respiration. Right - quiet respiration. Mouth and Throat Lips - Upper Lip - no fissures observed, no pallor noted. Lower Lip - no fissures observed, no pallor noted. Nasopharynx - no discharge present. Oral Cavity/Oropharynx - Tongue - no dryness observed. Oral Mucosa - no cyanosis observed. Hypopharynx - no evidence of airway distress observed.  Chest and Lung Exam Inspection Movements - Normal and Symmetrical. Accessory muscles - No use of accessory muscles in breathing. Palpation Palpation of the chest reveals - Non-tender. Auscultation Breath sounds - Normal and Clear.  Cardiovascular Auscultation Rhythm - Regular. Murmurs & Other Heart Sounds - Auscultation of the heart reveals - No Murmurs and No Systolic Clicks.  Abdomen Inspection Inspection of the abdomen reveals - No Visible peristalsis and No Abnormal pulsations. Umbilicus - No  Bleeding, No Urine drainage. Palpation/Percussion Palpation and Percussion of the abdomen reveal - Soft, Non Tender, No Rebound tenderness, No Rigidity (guarding) and No Cutaneous hyperesthesia. Note: Abdomen soft. Nontender. Not distended. Perhaps very small umbilical hernia No other hernias. No guarding.   Male Genitourinary Sexual Maturity Tanner 5 - Adult hair pattern and Adult penile size and shape. Note: No inguinal hernias. Normal external genitalia. Epididymi, testes, and spermatic cords normal without any masses.   Peripheral Vascular Upper Extremity Inspection - Left - No Cyanotic nailbeds, Not Ischemic. Right - No Cyanotic nailbeds, Not Ischemic.  Neurologic Neurologic evaluation reveals -normal attention span and ability to concentrate, able to name objects and repeat phrases. Appropriate fund of knowledge , normal sensation and normal coordination. Mental Status Affect - not angry, not paranoid. Cranial Nerves-Normal Bilaterally. Gait-Normal.  Neuropsychiatric Mental status exam performed with findings of-able to articulate well with normal speech/language, rate, volume and coherence, thought content normal with ability to perform basic computations and apply abstract reasoning and no evidence of hallucinations, delusions, obsessions or homicidal/suicidal ideation.  Musculoskeletal Global Assessment Spine, Ribs and Pelvis - no instability, subluxation or laxity. Right Upper Extremity - no instability, subluxation or laxity.  Lymphatic Head & Neck  General Head & Neck Lymphatics: Bilateral - Description - No Localized lymphadenopathy. Axillary  General Axillary Region: Bilateral - Description - No Localized lymphadenopathy. Femoral & Inguinal  Generalized Femoral & Inguinal Lymphatics: Left - Description - No Localized lymphadenopathy. Right - Description - No Localized lymphadenopathy.    Assessment & Plan CHRONIC KIDNEY DISEASE, STAGE V  REQUIRING CHRONIC DIALYSIS (N18.6) Impression: Worsening renal function. Need for dialysis appears to be imminent according his  nephrologist. Therefore request made for peritoneal dialysis catheter placement. Patient had not independent. However, his wife is a former Nurse, children's. His daughter is another Marine scientist. His other daughter is visiting from [place]. There are very motivated to help take care of him and get it make it succeed.  They've already started with CAPD training dialysis visits. They'll continue training.  Telephone Encounter Encounter Date: 10/06/2016 [name], Matthew M, MD  Cardiology    _0 [facility] copied text _1 Hover for attribution information He is cleared for peritoneal dialysis catheter placement. May hold Plavix 5 days prior.  Matthew Martinique MD, [facility]      . I'[name] okay with continue the aspirin. However hold the Plavix perioperatively. Patient's wife notes the patients been able to come off his Plavix for his hip & knee surgeries without difficulty.   Current Plans The anatomy & physiology of peritoneum was discussed. Natural history risks without surgery of worsening renal failure was discussed. I feel the risks of no intervention will lead to serious problems that outweigh the operative risks; therefore, I recommended placement of a peritoneal dialysis catheter. I explained laparoscopic techniques with possible need for an open approach.  Risks such as bleeding, infection, abscess, injury to other organs, catheter occlusion or malpositioning, reoperation to remove/reposition the catheter, heart attack, death, and other risks were discussed. I noted a good likelihood this will help address the problem. Possibility that this will not be enough to compensate for the renal failure & need for further treatment such as hemodialysis was explained. Goals of post-operative recovery were discussed as well. We will work to minimize complications.  The patient is/will be getting  training on catheter use by dialysis nursing before and after surgery. I stressed the importance of meticulous care & sterile technique to prevent catheter problems. Questions were answered. The patient expresses understanding & wishes to proceed with surgery.  I recommended obtaining preoperative cardiac clearance for recommendations on management of anticoagualtion perioperatively:  1. Timing of holding anticoagulation 2. Need for any bridge therapy (SQ enoxaparin, IV heparin, IV Aggrastat, etc) preop/postop. 3. Desired timing of resumption of anticoagulation.  In general from Dr. Johney Maine' standpoint:  Aspirin is okay to continue perioperatively (76m or 3232m & does not need to be held  Hold P2Y12 inibitors such as clopidrogel (Plavix) 4 dayspreoperatively  Request clearance by cardiology to better assess operative risk & see if a reevaluation, further workup, etc is needed. Also recommendations on how medications such as for anticoagulation and blood pressure should be managed/held/restarted after surgery.  Pt Education - [facility] Peritoneal Dialysis catheter placememt - CAPD

## 2016-10-30 NOTE — Interval H&P Note (Signed)
History and Physical Interval Note:  10/30/2016 10:30 AM  [name]  has presented today for surgery, with the diagnosis of end stage renal disease, dialysis dependent  The various methods of treatment have been discussed with the patient and family. After consideration of risks, benefits and other options for treatment, the patient has consented to  Procedure(s): LAPAROSCOPIC INSERTION CONTINUOUS AMBULATORY PERITONEAL DIALYSIS  (CAPD) CATHETER   OMENTOPEXY (N/A) LAPAROSCOPIC ABDOMINAL EXPLORATION and lysis of adhesions with possible hernia repairs (N/A) as a surgical intervention .  The patient's history has been reviewed, patient examined, no change in status, stable for surgery.  I have reviewed the patient's chart and labs.  Questions were answered to the patient's satisfaction.    I have re-reviewed the the patient's records, history, medications, and allergies.  I have re-examined the patient.  I again discussed intraoperative plans and goals of post-operative recovery.  The patient agrees to proceed.  [name]  1928/11/01 [id number]  Patient Care Team: Celene Squibb, MD as PCP - General (Internal Medicine) Fran Lowes, MD as Consulting Physician (Nephrology) Martinique, Peter M, MD as Consulting Physician (Cardiology) Glenna Fellows, MD as Attending Physician (Neurosurgery) Gaynelle Arabian, MD as Consulting Physician (Orthopedic Surgery)  Patient Active Problem List   Diagnosis Date Noted  . Essential hypertension 02/06/2015  . Moderate persistent asthma in adult without complication 48/54/6270  . Acute respiratory failure with hypoxia ([place]) 03/03/2013  . HCAP (healthcare-associated pneumonia) 03/03/2013  . Influenza A 03/03/2013  . Cardiomyopathy, ischemic- EF 45% 2D 02/27/13 03/01/2013  . CAD (coronary artery disease)- MI in '09 02/28/2013  . Mural thrombus of heart-old, no need for anticoagulation 02/28/2013  . Chronic combined systolic and diastolic CHF (congestive  heart failure) ([place]) 02/28/2013  . NSTEMI (non-ST elevated myocardial infarction)- type 2 02/28/2013  . CKD (chronic kidney disease), stage IV ([place]) 02/26/2013  . Osteoarthritis 07/15/2010  . Ischemic heart disease 07/15/2010  . Right bundle branch block 07/15/2010  . Benign hypertensive heart disease without heart failure 07/15/2010    Past Medical History:  Diagnosis Date  . Asthma   . Cancer ([place])    lymphoma  1979    . CHF (congestive heart failure) ([place])    2015  . Chronic renal insufficiency    creatinine ranges around 2.8  . Complication of anesthesia    No anesthesia complications, but concerns about neck positioning due to cervical disease  . DOE (dyspnea on exertion)   . Dyspnea    upon exertion  . Gout   . Herpes zoster 12/2009   left eye ..........last flare up "a long time"  . History of BPH   . HOH (hard of hearing)    hears well out of left ear  . Hypertension   . IHD (ischemic heart disease)    Remote MI in 2009 with late presentation; no reperfusion. Has large area of infarct in the apical anterior area on last nuclear in 2011. Managed medically  . MI, old 03/2007   ACUTE ANTEROSEPTAL  . OA (osteoarthritis) of knee    Left knee with revision and replacement of prosthetic L TKR  . Peripheral edema   . RBBB (right bundle branch block)   . Renal insufficiency   . Staph infection    states that gentamycin & vancomycin, (30 - 45 days worth) are what did his kidneys in, per his daughter    Past Surgical History:  Procedure Laterality Date  . BACK SURGERY     has  had 3 surgeries on his back  . BILATERAL CARPAL TUNNEL RELEASE    . BUNIONECTOMY    . CARDIAC CATHETERIZATION  03/24/2007  . CARDIOVASCULAR STRESS TEST  04/02/2009   EF 27% AND A LARGE AREA OF INFARCT IN THE APICAL ANTERIOR WITH MILD PERI-INFARCT ISCHEMIA  . FRACTURE SURGERY     both ankles  . JOINT REPLACEMENT     3 on right hip, 2 on knee  . KNEE SURGERY  1998   left knee  . neck surgeries  (x2)    . TOE AMPUTATION     LEFT FOOT  . TOTAL KNEE ARTHROPLASTY     left knee  . TRANSTHORACIC ECHOCARDIOGRAM  04/04/2009   EF 45-50%  . TRANSURETHRAL RESECTION OF PROSTATE      Social History   Social History  . Marital status: Married    Spouse name: N/A  . Number of children: N/A  . Years of education: N/A   Occupational History  . Retired [employer] Topics  . Smoking status: Never Smoker  . Smokeless tobacco: Never Used  . Alcohol use No  . Drug use: No  . Sexual activity: Not on file   Other Topics Concern  . Not on file   Social History Narrative  . No narrative on file    Family History  Problem Relation Age of Onset  . Coronary artery disease Mother   . Emphysema Father        never smoked, worked as a Psychologist, sport and exercise    Current Facility-Administered Medications  Medication Dose Route Frequency Provider Last Rate Last Dose  . ceFAZolin (ANCEF) IVPB 2g/100 mL premix  2 g Intravenous On Call to OR Michael Boston, MD      . Chlorhexidine Gluconate Cloth 2 % PADS 6 each  6 each Topical Once Michael Boston, MD       And  . Chlorhexidine Gluconate Cloth 2 % PADS 6 each  6 each Topical Once Michael Boston, MD         Allergies  Allergen Reactions  . Gentamycin [Gentamicin] Other (See Comments)    Decreased kidney fx  . Crestor [Rosuvastatin Calcium] Other (See Comments)    MUSCLE PAIN  . Hctz [Hydrochlorothiazide] Other (See Comments)    dizziness  . Lipitor [Atorvastatin Calcium] Other (See Comments)    MUSCLE PAIN    BP (!) 137/59   Pulse 72   Temp (!) 97.5 F (36.4 C) (Oral)   Resp 19   Ht 5' 3" (1.6 m)   Wt 80.7 kg (177 lb 14.4 oz)   SpO2 97%   BMI 31.51 kg/m   Labs: Results for orders placed or performed during the hospital encounter of 10/30/16 (from the past 48 hour(s))  Basic metabolic panel     Status: Abnormal   Collection Time: 10/30/16  7:57 AM  Result Value Ref Range   Sodium 136 135 - 145 mmol/L   Potassium 2.5 (LL)  3.5 - 5.1 mmol/L   Chloride 88 (L) 101 - 111 mmol/L   CO2 29 22 - 32 mmol/L   Glucose, Bld 210 (H) 65 - 99 mg/dL   BUN 118 (H) 6 - 20 mg/dL   Creatinine, Ser 5.64 (H) 0.61 - 1.24 mg/dL   Calcium 9.8 8.9 - 10.3 mg/dL   GFR calc non Af Amer 8 (L) >60 mL/min   GFR calc Af Amer 10 (L) >60 mL/min    Comment: (NOTE) The eGFR has  been calculated using the CKD EPI equation. This calculation has not been validated in all clinical situations. eGFR's persistently <60 mL/min signify possible Chronic Kidney Disease.    Anion gap 19 (H) 5 - 15    Imaging / Studies: No results found.   Adin Hector, M.D., F.A.C.S. Gastrointestinal and Minimally Invasive Surgery [facility] [address], [place], [place] [address] [phone] Main / Paging  10/30/2016 10:30 AM    , C.

## 2016-10-30 NOTE — Op Note (Signed)
10/30/2016  12:32 PM  PATIENT:  [name]  83 y.o. male  Patient Care Team: Celene Squibb, MD as PCP - General (Internal Medicine) Fran Lowes, MD as Consulting Physician (Nephrology) Martinique, Peter M, MD as Consulting Physician (Cardiology) Glenna Fellows, MD as Attending Physician (Neurosurgery) Gaynelle Arabian, MD as Consulting Physician (Orthopedic Surgery)  PRE-OPERATIVE DIAGNOSIS:  end stage renal disease, dialysis dependent  POST-OPERATIVE DIAGNOSIS:  end stage renal disease, dialysis dependent  PROCEDURE:  LAPAROSCOPIC INSERTION CONTINUOUS AMBULATORY PERITONEAL DIALYSIS  (CAPD) CATHETER   OMENTOPEXY  SURGEON:  Adin Hector, MD  ASSISTANT: Nurse   ANESTHESIA:   local and IV sedation  EBL:  No intake/output data recorded.  Delay start of Pharmacological VTE agent (>24hrs) due to surgical blood loss or risk of bleeding:  no  DRAINS:   It is a long tunneled CAPD peritoneal dialysis catheter ([employer] [id number] curl cath with titanium extender)   SPECIMEN:  No Specimen  DISPOSITION OF SPECIMEN:  N/A  COUNTS:  YES  PLAN OF CARE: Admit for overnight observation  PATIENT DISPOSITION:  PACU - hemodynamically stable.  INDICATION: Patient with CKD progressive to ESRD, dialysis dependence.  Patient & family wished to proceed.  Cleared by cardiology  The anatomy & physiology of peritoneum was discussed.  Natural history risks without surgery of worsening renal failure was discussed.   I feel the risks of no intervention will lead to serious problems that outweigh the operative risks; therefore, I recommended placement of a peritoneal dialysis catheter.  I explained laparoscopic techniques with possible need for an open approach.    Risks such as bleeding, infection, abscess, injury to other organs, catheter occlusion or malpositioning, reoperation to remove/reposition the catheter, heart attack, death, and other risks were discussed.   I noted a good likelihood  this will help address the problem.  Possibility that this will not be enough to compensate for the renal failure & need for further treatment such as hemodialysis was explained.  Goals of post-operative recovery were discussed as well.  We will work to minimize complications.   The patient is/will be getting training on catheter use by dialysis nursing before and after surgery.  I stressed the importance of meticulous care & sterile technique to prevent catheter problems.  Questions were answered.  The patient expresses understanding & wishes to proceed with surgery.  OR FINDINGS:   It is a long tunneled CAPD peritoneal dialysis catheter ([employer] [id number] curl cath with titanium extender) .  The curl tip of the catheter rests in the RIGHT deep pelvis.   Entry into the peritoneum it is in the right suprapubic region just above the dome of the bladder.  Deepest cuff in the periumbilical paramedian rectus muscle RUQ.    The exit site of the catheter on the skin is in the left upper abdomen subcostal region, 7 cm inferior to costal ridge, lateral clavicular line.  Blue adapter CAPD extension tubing attached  DESCRIPTION:   Informed consent was confirmed.  The patient underwent general anaesthesia without difficulty.  The patient was positioned appropriately.  VTE prevention in place.  The patient's abdomen was clipped, prepped, & draped in a sterile fashion.  Surgical timeout confirmed our plan.  The patient was positioned in reverse Trendelenburg.  Abdominal entry was gained using optical entry technique in the right upper abdomen.  Entry was clean.  I induced carbon dioxide insufflation.  Camera inspection revealed no injury.  Extra ports were carefully placed under direct laparoscopic visualization.  I saw no hernias.  I mobilized the greater omentum it into the upper abdomen.  I performed omentopexy with 2-0 prolene interrupted stitches to the omental edge in bilateral upper quadrants x2  using a laparoscopic Endoclose suture passer.  This kept the omentum from reaching into the infraumbilical abdomen.  He had a very redundant sigmoid colon covering most of the anterior pelvis.  The right side was more spaced.  I did lap lysis of adhesions to allow the LLQ to more easily drain into the right pelvis  I proceeded with placement of the peritoneal dialysis catheter into the RLQ since that side had no adhesions & it was a straighter shot into the pelvis.   I measured the catheter by surface anatomy such that the curl rested just distal to the pubic rim on the surface, marking where the deep cuff lied on the periumbilical skin.  I made an incision at that mark & tunnelled a 172mm long 53mm Applied dilating port obliquely through the abdominal wall from that periumbilical location through the abdominal wall inferiorly, splitting the left infraumbililcal rectus muscle, tunnelling into the preperitoneal space, and exiting into the peritoneum just cephalad to the dome of the bladder.  I placed the CAPD catheter through that port with the help of a stylet.  The curl of the CAPD catheter rested down in the cul de sac of the right lower pelvis.   It flushed & aspirated 264mL heparinized saline well.  The deep cuff rested just under the anterior rectus fascia in the rectus muscle just slightly infraumbilical.  I sutured preperitoneal fat of the bladder around the catheter with interrupted 2-0 prone suture to cover the catheter & keep it flipping - he had a distorted abdominapelvic cavity with his kyphoscoliosis.  I measured & cut to appropriate length the double-cuffed extension tubing so that the upper curve was 2cm below the costal ridge at the medial clavicular line.  I connected the upper double-cuff extra long catheter extention tubing to the tunnelled curl catheter using the [employer] titanium extender connector.    I tunneled the extended catheter using a long vascular tunneler with a blunt  bullet-screw-on tip, entering a subcostal wound to the periumbilical wound.  I tunnelled the catheter from the paramedian 79mm port exit site though the abdominal wall out the subcostal wound.  Therefore, the titanium connector was buried in the right upper quadrant SQ of the abdominal wall by that tunneling.  I left the proximal superficial cuff just inferior to the right subcostal wound.  I attached a Faller stylet ([employer] [id number], 50 degree bend) spike-tip tunneler to the end of the extended tubing.  I secured it to the tubing with a prolene tie.  I used the Faller stylet to tunnel the remaining tubing inferiorly and laterally to exit the skin in the left upper quadrant at the medial clavicular line, 7cm inferior & 7cm lateral to the subcostal wound.  The most distal cuff rests superior to the final exit site in the SQ.  I removed the Faller stylet.  No stitches were placed at the exit site.       Hemostasis was excellent.  The catheter flushed and aspirated easily with heparinized saline into the CAPD catheter.  I attached to the [employer] titanium hep lock adapter to the tubing.  I then attached the peritoneal dialysis catheter adapter extension blue cap tubing to that as well.  I removed the ports.  I closed the skin using 4-0 monocryl stitch.  Sterile dressings were applied. The patient was extubated & arrived in the PACU in stable condition..  I had discussed postoperative care with the patient in the holding area. I am about to locate the patient's family and discuss operative findings and postoperative goals / instructions.  Instructions and contact information are written in the chart as well.  The patient will require close followup of the CAPD peritoneal dialysis catheter through the peritoneal dialysis [place] within 24 hours.  Adin Hector, M.D., F.A.C.S. Gastrointestinal and Minimally Invasive Surgery [facility] [address], [place], [place] [id number] [phone] Main / Paging

## 2016-10-30 NOTE — Progress Notes (Signed)
1445 Received pt from PACU, sleepy. Lap sites to abd with dressing dry and intact. PD cath w/ dressing dry and intact.

## 2016-10-30 NOTE — Progress Notes (Signed)
Karie Fetch from lab called with critical potassium of 2.5.  Jovita Kussmaul, RN informed as I was assisting with a procedure and she informed Dr. Marcie Bal and Dr. Gifford Shave who ordered potassium to be administed

## 2016-10-30 NOTE — Discharge Instructions (Signed)
PERITONEAL DIALYSIS (CAPD) CATHETER PLACEMENT:  POST OPERATIVE INSTRUCTIONS  FOLLOW UP with the Peritoneal Dialysis Nurses   Call [phone] or your nephrologist, Dr Lowanda Foster, to help arrange training/flushes of your CAPD catheter   The CAPD nurses & Nephrology usually follow you closely, making the need for follow-up in the [facility] surgery office redundant and therefore not always needed.  If they or you have concerns, please call us for possible follow-up in our office  1. Do NOT shower until dialysis nursing staff have advised.  Do NOT submerge in a bathtub or hot tub. 2. Your Home Therapy RN will advise and educate you on showering, bathing, and swimming when you are in training. 3. The Peritoneal Dialysis nurse will remove your waterproof bandages in the Dialysis Center a few days after surgery.  Do not remove the bandages until seen by them.  If you dressing becomes wet, saturated, or falls off call your Home Therapy RN. 4. ACTIVITIES as tolerated:   a. You may resume regular (light) daily activities beginning the next day--such as daily self-care, walking, climbing stairs--gradually increasing activities as tolerated.  If you can walk 30 minutes without difficulty, it is safe to try more intense activity such as jogging, treadmill, bicycling, low-impact aerobics,  etc. b. No swimming within the 1st month of catheter placement.  You must have a Dr's order. c. Save the most intensive and strenuous activity for last such as sit-ups, heavy lifting, contact sports, etc  Refrain from any heavy lifting or straining until you are off narcotics for pain control.   d. DO NOT PUSH THROUGH PAIN.  Let pain be your guide: If it hurts to do something, don't do it.  Pain is your body warning you to avoid that activity for another week until the pain goes down. e. You may drive when you are no longer taking prescription pain medication, you can comfortably wear a seatbelt, and you can safely maneuver your  car and apply brakes. f. You may have sexual intercourse when it is comfortable.  g. Be sure your catheter is taped to Your abdomen nor injured. Did not allow catheter to ankle, or tension on the catheter-it may cause damage to skin over the catheter h. You will be instructed on what to do an emergency, and will have access to on call our in 24 hours a day. The number to reach the home therapy nurse as listed below. 5. DIET: Follow a light bland diet the first 24 hours after arrival home, such as soup, liquids, crackers, etc.  Be sure to include lots of fluids daily.  Avoid fast food or heavy meals as your are more likely to get nauseated.   6. Take your usually prescribed home medications unless otherwise directed. 7. PAIN CONTROL: a. Pain is best controlled by a usual combination of three different methods TOGETHER: i. Ice/Heat ii. Tylenol (over the counter pain medication) iii. Prescription pain medication b. Most patients will experience some swelling and bruising around the incisions.  Ice packs or heating pads (30-60 minutes up to 6 times a day) will help. Use ice for the first few days to help decrease swelling and bruising, then switch to heat to help relax tight/sore spots and speed recovery.  Some people prefer to use ice alone, heat alone, alternating between ice & heat.  Experiment to what works for you.  Swelling and bruising can take several weeks to resolve.   c. It is helpful to take an over-the-counter pain medication  regularly for the first few weeks.  Using acetaminophen (Tylenol, etc) 500-650mg  four times a day (every meal & bedtime) is usually safest since NSAIDs are not advisable in patients with kidney disease. d. A  prescription for pain medication (such as oxycodone, hydrocodone, etc) should be given to you upon discharge.  Take your pain medication as prescribed.  i. If you are having problems/concerns with the prescription medicine (does not control pain, nausea, vomiting,  rash, itching, etc), please call us [phone] to see if we need to switch you to a different pain medicine that will work better for you and/or control your side effect better. ii. If you need a refill on your pain medication, please contact your pharmacy.  They will contact our office to request authorization. Prescriptions will not be filled after 5 pm or on week-ends. 8. Avoid getting constipated.  Between the surgery and the pain medications, it is common to experience some constipation.  Increasing fluid intake and taking a fiber supplement (such as Metamucil, Citrucel, FiberCon, MiraLax, etc) 1-2 times a day regularly will usually help prevent this problem from occurring.  A mild laxative (prune juice, Milk of Magnesia, MiraLax, etc) should be taken according to package directions if there are no bowel movements after 48 hours.       FOLLOW UP: Peritoneal Dialysis nurses  [address], [place], [place] [address]  Call [phone] or your neprologist to help arrange training/flushes of your CAPD catheter    -The CAPD nurses & Nephrology usually follow you closely, making the need for follow-up in our office redundant and therefore not needed.  If they or you have concerns, please call us for possible follow-up in our office   -Please call [facility] at [phone] only as needed.  WHEN TO CALL us [phone]: 1. Poor pain control 2. Reactions / problems with new medications (rash/itching, nausea, etc)  3. Fever over 101.5 F (38.5 C) 4. Worsening swelling or bruising 5. Continued bleeding from incision. 6. Increased pain, redness, or drainage from the incision   The clinic staff is available to answer your questions during regular business hours (8:30am-5pm).  Please dont hesitate to call and ask to speak to one of our nurses for clinical concerns.   If you have a medical emergency, go to the nearest emergency room or call 911.  A surgeon from [facility] is always on  call at the hospitals  9. IF YOU HAVE DISABILITY OR FAMILY LEAVE FORMS, BRING THEM TO THE OFFICE FOR PROCESSING.  DO NOT GIVE THEM TO YOUR DOCTOR.  [facility], [place], [place], [place], [place]  [address] ? MAIN: [phone] ? TOLL FREE: [phone] ?  FAX [phone] [email or web address]  Peritoneal Dialysis - An Overview Dialysis can be done using a machine outside of the body (hemodialysis). Or, it can be done inside the body (peritoneal dialysis). The word "peritoneal" refers to the lining or membrane of the belly (abdominal cavity). The peritoneal membrane is a thin, plastic-like lining inside the belly that covers the organs and fits in the abdominal or peritoneal cavity, such as the stomach, liver and the kidneys. This lining works like a filter. It will allow certain things to pass from your blood through the lining and into a special solution that has been placed into your belly. In this type of dialysis, the peritoneum is used to help clean the blood.  If you need dialysis, your kidneys are not working  right. Healthy kidneys take out extra water and waste products, which becomes urine. When the kidneys do not do this, serious problems can develop. The waste and water build up in the blood. Your hands and feet might swell. You may feel tired, weak or sick to your stomach. Also, your blood pressure may rise. If not treated, you could die. Dialysis is a treatment that does the work that your kidneys would do if they were healthy.  It cleans your blood.   It will make sure your body has the right amount of certain chemicals that it needs. They include potassium, sodium and bicarbonate.   It will help control your blood pressure.  UNDERSTANDING PERITONEAL DIALYSIS  Here is how peritoneal dialysis works:   First, you will have surgery to put a soft plastic tube (catheter) into your belly (abdomen). This will allow you to easily connect  yourself to special tubing, which will then let a special dialysis solution to be placed into your abdomen.   For each treatment, you will need at least one bag of dialysis solution (a liquid called dialysate). It is a mix of water that is pure and free of germs (sterile), sugar (dextrose) and the nutrients and minerals found in your blood. Sometimes, more than one bag is needed to get the right amount of fluid for your abdomen. Your caregiver will explain what size and how many bags you will need.   The dialysate is slowly put through the catheter to fill the abdomen (called the peritoneal cavity). This dialysate will need to stay in your body for 3-4 hours. This is known as the dwell time.   The solution is working to clean the blood and remove wastes from your body. At the end of this time, the solution is drained from your body through tubing into an empty bag. It is then replaced with a fresh dialysate.   The draining and replacing of the dialysate is called an exchange or cycle. The catheter is capped after each exchange. Once the solution is in your body, you are then free to do whatever you would like until the next exchange. Most people will need to do 4-5 exchanges each day.   There are two different methods that can be used.   Continuous ambulatory peritoneal dialysis (CAPD): You put the solution into your abdomen, cap your catheter and then go about your day. Several hours later, you reconnect to a tubing set up, drain out the solution and then put more solution in. This is done several times a day. No machine is needed.   Continuous cycler-assisted peritoneal dialysis (CCPD): A machine is used, which fills the abdomen with dialysate and then drains it. This happens several times. It usually is done at night while you are sleeping. When you wake up, you can disconnect from the machine and are free to go to go about your day.  PREPARING FOR EXCHANGES  Discuss the details of the procedure  with your caregivers. You will be working with a nurse who is specially trained in doing dialysis. Make sure you understand:   How to do an exchange.   How much solution you need.   What type of solution you will need.   How often you should do an exchange. Ask:   How many times each day?   When? At meals? At bedtime?   Always keep the dialysate bags and other supplies in a cool, clean and dry place.   Keeping everything clean  is very important.   The catheter and its cap must be free from germs (sterile)   The adapter also must be sterile. It attaches the dialysis bag and tubing to the catheter.   Clean the area of your body around the catheter every day. Use a chemical that fights infection (antiseptic).   Wash your hands thoroughly before starting an exchange.   You may be taught to wear a mask to cover your nose and mouth. This makes infection less likely to happen.   You may be taught to close doors, windows and turn off any fans before doing an exchange.   Check the dialysate bag very carefully.   Make sure it is the right size bag for you. This information is on the label.   Also, make sure it is the right mixture. For some people, the dialysate contents vary. For instance, the mixture might be a stronger solution for overnight.   Check the expiration date (the last date you can use the bag). It also is on the label. If the date has gone by, throw away the bag.   The solution should be clear. You should be able to see any writing on the side of the bag clearly through the solution. Do not use a cloudy solution.   Gently squeeze the bag to make sure there are no leaks.   Use a dry heating pad to warm the dialysate in the bag. Leave the cover on the bag while you do this.   This is for comfort. You can skip this step if you want.   Never place the bag of solution under warm or hot water. Water from a faucet is not sterile and could cause germs to get into the bag.  Infection could then result.  PERFORMING AN EXCHANGE  For continuous ambulatory dialysis:   Attach the dialysis bag and tubing to your catheter. Hang the bag so that gravity (the natural downward pull) draws the solution down and into your abdomen once the clamps are opened. This should take about 10 minutes.   Remove the bag and tubing from the catheter. Cap the catheter.   The solution stays in the abdomen for 3-4 hours (dwell time). The solution is working to clean the blood and remove wastes from your body.   When you are ready to drain the solution for another exchange, take the cap off the catheter. Then, attach the catheter to tubing, which is attached to an empty bag. Place this empty bag below the abdomen or on the floor or stool and undo the clamps.   Gravity helps pull the fluid out of the abdomen and into the bag. The fluid in the bag may look yellow and clear, like urine. It usually takes about 20 minutes to drain the fluid out of the abdomen.   When the solution has drained, start the process again by infusing a new bag of dialysate and then capping the catheter.   This should continue until you have used all of the solution that you are to use each day.   Sometimes, a small machine is used overnight. It is called a mini-cycler. This is done if the body cannot go all night without an exchange. The machine lets you sleep without having to get up and do an exchange.   For continuous cycler-assisted dialysis:   You will be taught how to set up or program your machine.   When you are ready for bed, put the dialysate  bags onto the cycler machine. Put on exactly the number of bags that your caregiver said to use.   Connect your catheter to the machine and turn the cycler machine on.   Overnight, the cycler will do several exchanges. It often does three to five, sometimes more.   Solution that is in your abdomen in the morning will stay during the day. The machine is set to make  the daytime solution stronger, if that is needed.   In the morning, you will disconnect from the machine and cap your catheter and go about your day.   Sometimes, an extra exchange is done during the day. This may be needed to remove excess waste or fluid.  IMPORTANT REMINDERS  You will need to follow a very strict schedule. Every step of the dialysis procedure must be done every day. Sometimes, several times a day. Altogether, this might take an extra 2 hours or more. However, you must stick to the routine. Do not skip a day. Do not skip a procedure.   Some people find it helpful to work with a Social worker or Education officer, museum in addition to the renal (kidney) nurse. They can help you figure out how to change your daily routine to fit in the dialysis sessions.   You may need to change your diet. Ask your caregiver for advice, or talk with a nutritionist about what you should and should not eat.   You will need to weigh yourself every day and keep track of what your weight is.   You may be taught how to check your blood pressure before every exchange. Your blood pressure reading will help determine what type of solution to use. If your blood pressure is too high, you may need a stronger solution.  RISKS AND COMPLICATIONS  Possible problems vary, depending on the method you use. Your overall health also can have an effect. Problems that could develop because of dialysis include:  Infection. This is the most common problem. It could occur:   In the peritoneum. This is called peritonitis.   Around the catheter.   Weight gain. The dialysate contains a type of sugar known as dextrose. Dextrose has a lot of calories. The body takes in several hundred calories from this sugar each day.   Weakened muscles in the abdomen. This can result from all of the fluid that your body has to hold in the abdomen.   Catheter replacement. Sometimes, a new one has to be put in.   Change in dialysis method. Due to  some complications, you may need to change to hemodialysis for a short time and have your dialysis done at a center.   Trouble adjusting to your new lifestyle. In some people, this leads to depression.   Sleep problems.   Dialysis-related amyloidosis. This sometimes occurs after 5 years of dialysis. Protein builds up in the blood. This can cause painful deposits on bones, joints and tendons (which connect muscle to bone). Or, it can cause hollow spots in bones that make them more likely to break.   Excess fluid. Your body may absorb too much of the fluid that is held in the abdomen. This can lead to heart or lung problems.  SEEK MEDICAL CARE IF:   You have any problems with an exchange.   The area around the catheter becomes red or painful.   The catheter seems loose, or it feels like it is coming out.   A bag of dialysate looks cloudy. Or, the  liquid is an unusual color.   Abdominal pain or discomfort.   You feel sick to your stomach (nauseous) or throw up (vomit).   You develop a fever of more than 102 F (38.9 C).  SEEK IMMEDIATE MEDICAL CARE IF:  You develop a fever of more than 102 F (38.9 C). Document Released: 11/03/2008 Document Revised: 12/26/2010 Document Reviewed: 11/03/2008 [facility] Patient Information 2012 [place].  Diet for Peritoneal Dialysis This diet may be modified in protein, sodium, phosphorus, potassium, or fluid, depending on your needs. The goals of nutrition therapy are similar to those for patients on hemodialysis. Providing enough protein to replace peritoneal losses is a priority. USES OF THIS DIET The diet is designed for the patient with end-stage kidney (renal) disease, who is treated by peritoneal dialysis. Treatment options include:  Continuous Ambulatory Peritoneal Dialysis (CAPD): Usually 4 exchanges of 1.5 to 2 liter volumes of glucose (sugar) and electrolyte-containing dialysate.   Continuous Cyclic Peritoneal Dialysis (CCPD):  Essentially a reversal of CAPD, with shorter exchanges at night and a longer one during the day.   Intermittent Peritoneal Dialysis (IPD): 10 to 12 hours of exchanges, 2 to 3 times weekly.  ADEQUACY The diet may not meet the Recommended Dietary Allowances of the [employer] for calcium and ascorbic acid. Protein and water-soluble vitamin needs may be increased because of losses into the dialysate. Recommended daily supplements are the same as for hemodialysis patients. ASSESSMENT/DETERMINATION OF DIET Dietary needs will differ between patients. Parameters must be individualized. Protein  Guidelines: 1.2 to 1.3 gm/kg/day OR 1.5 gm/kg/day if patient is malnourished, catabolic, or has a protracted episode of peritonitis. A minimum of 50% of the protein intake should be of high biological value.   Goals: Meet protein requirements and replace dialysate losses while avoiding excessive accumulation of waste products. Achieve serum albumin greater than 3.5 g/dL.   Evaluate: Current nutritional status, serum albumin and BUN levels, presence of peritonitis.  Sodium  Guidelines: Usually 90 to 175 mEq (2000 to 4000 mg), but should be individualized.   Goals: Minimize complications of fluid imbalance.   Evaluate: Weight, blood pressure regulation, and presence of swelling (edema).  Potassium  Guidelines: Individualized; often not restricted, and may need to be supplemented.   Goals: Serum K+ levels between 4.0 to 5.0 mEq/L.   Evaluate: Serum K+ levels, usual intake of K+, appetite.  Phosphorus  Guidelines: 800 to 1200 mg/day (the high protein intake results in a high obligatory P intake).   Goal: Serum P levels between 4.5 to 6.0 mg/dL.   Evaluate: Serum P levels, usual P intake, P-binding medications: type, number, dosage, distribution.  Fluids  Guidelines: Individualized - may not be restricted for all patients.   Goal: Minimize complications of fluid imbalance.    Evaluate: Weight, blood pressure regulation, sodium intake, and presence of edema.  Document Released: 01/06/2005 Document Revised: 12/26/2010 Document Reviewed: 03/31/2006 [facility] Patient Information 2012 [place].

## 2016-10-30 NOTE — Transfer of Care (Signed)
Immediate Anesthesia Transfer of Care Note  Patient: [name]  Procedure(s) Performed: LAPAROSCOPIC INSERTION CONTINUOUS AMBULATORY PERITONEAL DIALYSIS  (CAPD) CATHETER   OMENTOPEXY (N/A Abdomen)  Patient Location: PACU  Anesthesia Type:General  Level of Consciousness: awake, alert , oriented and patient cooperative  Airway & Oxygen Therapy: Patient Spontanous Breathing and Patient connected to nasal cannula oxygen  Post-op Assessment: Report given to RN, Post -op Vital signs reviewed and stable and Patient moving all extremities X 4  Post vital signs: Reviewed and stable  Last Vitals:  Vitals:   10/30/16 0821  BP: (!) 137/59  Pulse: 72  Resp: 19  Temp: (!) 36.4 C  SpO2: 97%    Last Pain:  Vitals:   10/30/16 0821  TempSrc: Oral      Patients Stated Pain Goal: 1 (49/17/91 5056)  Complications: No apparent anesthesia complications

## 2016-10-30 NOTE — Progress Notes (Signed)
Patient's potassium 2.5  Dr. Gifford Shave notified, received verbal order to give 1 run of potassium

## 2016-10-30 NOTE — Anesthesia Procedure Notes (Signed)
Procedure Name: Intubation Date/Time: 10/30/2016 10:52 AM Performed by: Carney Living Pre-anesthesia Checklist: Patient identified, Emergency Drugs available, Suction available, Patient being monitored and Timeout performed Patient Re-evaluated:Patient Re-evaluated prior to induction Oxygen Delivery Method: Circle system utilized Preoxygenation: Pre-oxygenation with 100% oxygen Induction Type: IV induction Ventilation: Mask ventilation without difficulty and Oral airway inserted - appropriate to patient size Laryngoscope Size: Glidescope and 4 Grade View: Grade I Tube type: Oral Tube size: 7.5 mm Number of attempts: 1 Airway Equipment and Method: Stylet Placement Confirmation: ETT inserted through vocal cords under direct vision,  positive ETCO2 and breath sounds checked- equal and bilateral Secured at: 23 cm Tube secured with: Tape Dental Injury: Teeth and Oropharynx as per pre-operative assessment  Comments: Neck remained neutral throughout DL and ETT placement, VSS, small nick in left upper lip

## 2016-10-30 NOTE — Progress Notes (Signed)
Notified Dr. Gifford Shave that patient was receiving potassium and Dr. Gifford Shave confirmed that patient did not need repeat potassium.  Will continue to monitor patient.

## 2016-10-31 ENCOUNTER — Ambulatory Visit (HOSPITAL_COMMUNITY): Payer: Medicare HMO

## 2016-10-31 ENCOUNTER — Ambulatory Visit: Payer: Medicare HMO | Admitting: Cardiology

## 2016-10-31 DIAGNOSIS — R001 Bradycardia, unspecified: Secondary | ICD-10-CM

## 2016-10-31 DIAGNOSIS — R42 Dizziness and giddiness: Secondary | ICD-10-CM

## 2016-10-31 DIAGNOSIS — Z992 Dependence on renal dialysis: Secondary | ICD-10-CM | POA: Diagnosis not present

## 2016-10-31 DIAGNOSIS — I255 Ischemic cardiomyopathy: Secondary | ICD-10-CM | POA: Diagnosis not present

## 2016-10-31 DIAGNOSIS — I952 Hypotension due to drugs: Secondary | ICD-10-CM

## 2016-10-31 DIAGNOSIS — N185 Chronic kidney disease, stage 5: Secondary | ICD-10-CM

## 2016-10-31 DIAGNOSIS — H532 Diplopia: Secondary | ICD-10-CM | POA: Diagnosis not present

## 2016-10-31 DIAGNOSIS — I252 Old myocardial infarction: Secondary | ICD-10-CM | POA: Diagnosis not present

## 2016-10-31 DIAGNOSIS — N186 End stage renal disease: Secondary | ICD-10-CM | POA: Diagnosis not present

## 2016-10-31 DIAGNOSIS — D631 Anemia in chronic kidney disease: Secondary | ICD-10-CM | POA: Diagnosis not present

## 2016-10-31 DIAGNOSIS — I5042 Chronic combined systolic (congestive) and diastolic (congestive) heart failure: Secondary | ICD-10-CM | POA: Diagnosis not present

## 2016-10-31 DIAGNOSIS — N400 Enlarged prostate without lower urinary tract symptoms: Secondary | ICD-10-CM | POA: Diagnosis not present

## 2016-10-31 DIAGNOSIS — I132 Hypertensive heart and chronic kidney disease with heart failure and with stage 5 chronic kidney disease, or end stage renal disease: Secondary | ICD-10-CM | POA: Diagnosis not present

## 2016-10-31 DIAGNOSIS — I959 Hypotension, unspecified: Secondary | ICD-10-CM | POA: Diagnosis not present

## 2016-10-31 DIAGNOSIS — J4540 Moderate persistent asthma, uncomplicated: Secondary | ICD-10-CM | POA: Diagnosis not present

## 2016-10-31 DIAGNOSIS — R0602 Shortness of breath: Secondary | ICD-10-CM | POA: Diagnosis not present

## 2016-10-31 DIAGNOSIS — I2510 Atherosclerotic heart disease of native coronary artery without angina pectoris: Secondary | ICD-10-CM | POA: Diagnosis not present

## 2016-10-31 DIAGNOSIS — M109 Gout, unspecified: Secondary | ICD-10-CM | POA: Diagnosis not present

## 2016-10-31 LAB — CBC
HCT: 35.6 % — ABNORMAL LOW (ref 39.0–52.0)
Hemoglobin: 11.6 g/dL — ABNORMAL LOW (ref 13.0–17.0)
MCH: 30.4 pg (ref 26.0–34.0)
MCHC: 32.6 g/dL (ref 30.0–36.0)
MCV: 93.4 fL (ref 78.0–100.0)
PLATELETS: 241 10*3/uL (ref 150–400)
RBC: 3.81 MIL/uL — ABNORMAL LOW (ref 4.22–5.81)
RDW: 14.5 % (ref 11.5–15.5)
WBC: 7.2 10*3/uL (ref 4.0–10.5)

## 2016-10-31 LAB — RENAL FUNCTION PANEL
ALBUMIN: 3.4 g/dL — AB (ref 3.5–5.0)
ANION GAP: 17 — AB (ref 5–15)
BUN: 114 mg/dL — AB (ref 6–20)
CALCIUM: 9.3 mg/dL (ref 8.9–10.3)
CO2: 29 mmol/L (ref 22–32)
Chloride: 91 mmol/L — ABNORMAL LOW (ref 101–111)
Creatinine, Ser: 5.6 mg/dL — ABNORMAL HIGH (ref 0.61–1.24)
GFR calc Af Amer: 10 mL/min — ABNORMAL LOW (ref >60)
GFR, EST NON AFRICAN AMERICAN: 8 mL/min — AB (ref >60)
Glucose, Bld: 135 mg/dL — ABNORMAL HIGH (ref 65–99)
PHOSPHORUS: 10.4 mg/dL — AB (ref 2.5–4.6)
Potassium: 3.3 mmol/L — ABNORMAL LOW (ref 3.5–5.1)
SODIUM: 137 mmol/L (ref 135–145)

## 2016-10-31 LAB — TROPONIN I
TROPONIN I: 0.08 ng/mL — AB (ref <0.03)
TROPONIN I: 0.07 ng/mL — AB (ref <0.03)

## 2016-10-31 MED ORDER — ISOSORBIDE MONONITRATE ER 60 MG PO TB24
60.0000 mg | ORAL_TABLET | Freq: Every day | ORAL | Status: DC
  Filled 2016-10-31 (×3): qty 1

## 2016-10-31 MED ORDER — CHLORHEXIDINE GLUCONATE 0.12 % MT SOLN
15.0000 mL | Freq: Two times a day (BID) | OROMUCOSAL | Status: DC
  Administered 2016-10-31 – 2016-11-01 (×3): 15 mL via OROMUCOSAL
  Filled 2016-10-31 (×3): qty 15

## 2016-10-31 MED ORDER — HYDRALAZINE HCL 25 MG PO TABS
25.0000 mg | ORAL_TABLET | Freq: Three times a day (TID) | ORAL | Status: DC
  Administered 2016-10-31 – 2016-11-01 (×2): 25 mg via ORAL
  Filled 2016-10-31 (×2): qty 1

## 2016-10-31 MED ORDER — POTASSIUM CHLORIDE 20 MEQ PO PACK
20.0000 meq | PACK | Freq: Two times a day (BID) | ORAL | Status: AC
  Administered 2016-10-31 (×2): 20 meq via ORAL
  Filled 2016-10-31 (×3): qty 1

## 2016-10-31 MED ORDER — ORAL CARE MOUTH RINSE: 15.0000 mL | Freq: Two times a day (BID) | OROMUCOSAL | Status: DC

## 2016-10-31 MED ORDER — METOPROLOL SUCCINATE ER 25 MG PO TB24: 12.5000 mg | ORAL_TABLET | Freq: Every day | ORAL | Status: DC

## 2016-10-31 MED ORDER — METOPROLOL SUCCINATE ER 25 MG PO TB24
12.5000 mg | ORAL_TABLET | Freq: Every day | ORAL | Status: DC
  Administered 2016-11-01 – 2016-11-02 (×2): 12.5 mg via ORAL
  Filled 2016-10-31 (×2): qty 1

## 2016-10-31 MED ORDER — SODIUM CHLORIDE 0.9 % IV BOLUS (SEPSIS)
500.0000 mL | Freq: Once | INTRAVENOUS | Status: AC
  Administered 2016-10-31: 500 mL via INTRAVENOUS

## 2016-10-31 NOTE — Progress Notes (Signed)
Received a critical troponin result of 0.08. MD paged. Pt's daughter also feels that the cardiologist Dr Martin[place] needs to be involved

## 2016-10-31 NOTE — Progress Notes (Signed)
Pt is in bed, denies chest pain, no SOB. HR 46.

## 2016-10-31 NOTE — Consult Note (Signed)
Cardiology Consult    Patient ID: [name]; [id number]; 1928/04/11   Admit date: 10/30/2016 Date of Consult: 10/31/2016  Primary Care Provider: Celene Squibb, MD Primary Cardiologist: Dr. Martinique  Patient Profile    [name] is a 83 y.o. male with past medical history of CAD (s/p MI in 2009 with occlusion of the LAD --> medically treated), chronic combined systolic and diastolic CHF, ischemic cardiomyopathy, Stage 5 CKD, and HTN  who is being seen today for the evaluation of bradycardia at the request of Dr. Johney Maine.   History of Present Illness    [name] was last examined by Almyra Deforest, PA-C on 09/18/2016 for follow-up and was taking Lasix 80mg  daily at that time. He reported dyspnea on exertion with associated chest pain but with his Stage 4 CKD, cardiac catheterization was not pursued secondary to the risk of contrast-induced nephropathy and he was continued on medical therapy.   He presented to [facility] on 10/30/2016 for placement of a peritoneal dialysis catheter. He had been cleared by Cardiology to hold Plavix for 5 days prior to the procedure. He underwent laparoscopic insertion of a peritoneal dialysis catheter and no immediate complications were noted.   He was scheduled for discharge this morning but reported not feeling well and being unable to urinate and having double vision. Says the double vision is exacerbated when turning his head from side-to-side. Has experienced associated lightheadedness but denies any headaches, weakness, or presyncope. No associated chest pain, palpitations, dyspnea, orthopnea, PND, or lower extremity edema. BP was soft with SBP in the 90's following his surgery yesterday. BP was at 139/71 this morning, at 90/51 following administration of his AM medications.  Labs have been checked and show WBC at 7.2, Hgb 11.6, platelets 241, Na+ 137, K+ 3.3, creatinine 5.60. Initial troponin checked and 0.08. EKG showed sinus bradycardia, HR 47, and  bifascicular block (similar to prior tracings).     Past Medical History   Past Medical History:  Diagnosis Date  . Acute respiratory failure with hypoxia ([place]) 03/03/2013  . Asthma   . CAD (coronary artery disease)- MI in '09 02/28/2013  . Cancer ([place])    lymphoma  1979    . CHF (congestive heart failure) ([place])    2015  . Chronic renal insufficiency    creatinine ranges around 2.8  . Complication of anesthesia    No anesthesia complications, but concerns about neck positioning due to cervical disease  . DOE (dyspnea on exertion)   . Dyspnea    upon exertion  . Gout   . HCAP (healthcare-associated pneumonia) 03/03/2013  . Herpes zoster 12/2009   left eye ..........last flare up "a long time"  . History of BPH   . HOH (hard of hearing)    hears well out of left ear  . Hypertension   . IHD (ischemic heart disease)    Remote MI in 2009 with late presentation; no reperfusion. Has large area of infarct in the apical anterior area on last nuclear in 2011. Managed medically  . Influenza A 03/03/2013  . MI, old 03/2007   ACUTE ANTEROSEPTAL  . NSTEMI (non-ST elevated myocardial infarction)- type 2 02/28/2013  . OA (osteoarthritis) of knee    Left knee with revision and replacement of prosthetic L TKR  . Peripheral edema   . RBBB (right bundle branch block)   . Renal insufficiency   . Staph infection    states that gentamycin & vancomycin, (30 -  45 days worth) are what did his kidneys in, per his daughter     Allergies:   Allergies  Allergen Reactions  . Gentamycin [Gentamicin] Other (See Comments)    Decreased kidney fx  . Crestor [Rosuvastatin Calcium] Other (See Comments)    MUSCLE PAIN  . Hctz [Hydrochlorothiazide] Other (See Comments)    dizziness  . Lipitor [Atorvastatin Calcium] Other (See Comments)    MUSCLE PAIN    Home Medications:   Home Medications:  Prior to Admission medications   Medication Sig Start Date End Date Taking? Authorizing Provider  albuterol  (PROVENTIL) (2.5 MG/3ML) 0.083% nebulizer solution INHALE EVERY 4-6HRS AS NEEDED FOR WHEEZING/SHORTNESS OF BREATH 10/01/16  Yes [provider]  allopurinol (ZYLOPRIM) 100 MG tablet Take one half tablet by mouth once daily. ADDITIONAL REFILLS FROM PCP Patient taking differently: Take 50 mg by mouth daily.  02/05/15  Yes Darlin Coco, MD  aspirin 81 MG tablet Take 81 mg by mouth every other day.     Yes [provider]  clopidogrel (PLAVIX) 75 MG tablet TAKE 1 TABLET BY MOUTH EVERY DAY Patient taking differently: TAKE 75 MG BY MOUTH EVERY DAY 09/08/16  Yes Martinique, Peter M, MD  furosemide (LASIX) 80 MG tablet Take 80 mg by mouth daily.    Yes [provider]  hydrALAZINE (APRESOLINE) 50 MG tablet Take 1.5 tablets (75 mg total) by mouth 3 (three) times daily. 03/01/13  Yes Kilroy, Luke K, PA-C  isosorbide mononitrate (IMDUR) 60 MG 24 hr tablet Take 1 tablet (60 mg total) by mouth daily. 05/05/16 10/21/16 Yes Weaver, Scott T, PA-C  metolazone (ZAROXOLYN) 2.5 MG tablet Take 2.5 mg by mouth daily as needed (for fluid).   Yes [provider]  metoprolol tartrate (LOPRESSOR) 25 MG tablet Take 25 mg by mouth 2 (two) times daily.  04/27/15  Yes [provider]  Propylene Glycol-Glycerin (SOOTHE OP) Place 1 drop into both eyes as needed (for dry eyes).   Yes [provider]  triamcinolone ointment (KENALOG) 0.1 % Apply 1 application topically daily as needed (for itchy skin).  10/17/14  Yes [provider]  nitroGLYCERIN (NITROSTAT) 0.4 MG SL tablet Place 1 tablet (0.4 mg total) under the tongue every 5 (five) minutes as needed for chest pain (chest pain). 05/02/16   Martinique, Peter M, MD  traMADol (ULTRAM) 50 MG tablet Take 1 tablet (50 mg total) by mouth every 6 (six) hours as needed for moderate pain or severe pain. 10/30/16   Michael Boston, MD    Inpatient Medications    Scheduled Meds: . acetaminophen  1,000 mg Oral Q8H  . allopurinol  50 mg Oral  Daily  . aspirin  81 mg Oral QODAY  . chlorhexidine  15 mL Mouth Rinse BID  . enoxaparin (LOVENOX) injection  30 mg Subcutaneous Q24H  . lip balm  1 application Topical BID  . mouth rinse  15 mL Mouth Rinse q12n4p  . potassium chloride  20 mEq Oral BID  . sodium chloride flush  3 mL Intravenous Q12H   Continuous Infusions: . sodium chloride    . lactated ringers    . lactated ringers     PRN Meds: sodium chloride, albuterol, bisacodyl, diphenhydrAMINE **OR** diphenhydrAMINE, fentaNYL (SUBLIMAZE) injection, lactated ringers, lactated ringers, magic mouthwash, methocarbamol, nitroGLYCERIN, ondansetron **OR** ondansetron (ZOFRAN) IV, polyethylene glycol, polyvinyl alcohol, simethicone, sodium chloride flush, traMADol  Family History    Family History  Problem Relation Age of Onset  . Coronary artery disease Mother   .  Emphysema Father        never smoked, worked as a Psychologist, sport and exercise    Social History    Social History   Social History  . Marital status: Married    Spouse name: N/A  . Number of children: N/A  . Years of education: N/A   Occupational History  . Retired [employer] Top[employer]  . Smoking status: Never Smoker  . Smokeless tobacco: Never Used  . Alcohol use No  . Drug use: No  . Sexual activity: Not on file   Other Topics Concern  . Not on file   Social History Narrative  . No narrative on file     Review of Systems    General:  No chills, fever, night sweats or weight changes.  Cardiovascular:  No chest pain, dyspnea on exertion, edema, orthopnea, palpitations, paroxysmal nocturnal dyspnea. Dermatological: No rash, lesions/masses Respiratory: No cough, dyspnea Urologic: No hematuria, dysuria Abdominal:   No nausea, vomiting, diarrhea, bright red blood per rectum, melena, or hematemesis Neurologic:  No changes in mental status. Positive for weakness and blurred vision.   All other systems reviewed and are otherwise negative except as noted  above.  Physical Exam/Data    Blood pressure (!) 90/51, pulse (!) 45, temperature 98.4 F (36.9 C), temperature source Oral, resp. rate 18, height 5\' 3"  (1.6 m), weight 177 lb 14.4 oz (80.7 kg), SpO2 94 %.  General: Pleasant, elderly Caucasian male appearing in NAD Psych: Normal affect. Neuro: Alert and oriented X 3. Moves all extremities spontaneously. HEENT: Normal  Neck: Supple without bruits or JVD. Lungs:  Resp regular and unlabored, CTA without wheezing or rales. Heart: Regular rhythm, bradycardiac rate, no s3, s4, or murmurs. Abdomen: Soft, non-tender, appears distended (new since laparoscopic procedure and improving according to the patient).   Extremities: No clubbing, cyanosis or edema. DP/PT/Radials 2+ and equal bilaterally.   EKG:  The EKG was personally reviewed and demonstrates: Sinus bradycardia, HR 47, and bifascicular block.    Labs/Studies     Relevant CV Studies:  Echocardiogram: 02/2013 Study Conclusions  - Left ventricle: The cavity size was normal. Wall thickness was normal. Systolic function was mildly to moderately reduced. The estimated ejection fraction was in the range of 40% to 45%. Dyskinesis and aneurysmal deformity of the inferior and septal segments of the apex; consistent with infarction in the distribution of the left anterior descending coronary artery. Moderate hypokinesis of the mid-distalanteroseptal and anterior myocardium. There was a possible, flat (mural), fixedthrombus. - Pulmonary arteries: Systolic pressure was mildly increased. PA peak pressure: 69mm Hg (S).  Cardiac Catheterization: 03/2007  HEMODYNAMIC DATA:  The aortic pressure was 134/77, LV is 127/15-26.   ANGIOGRAPHIC DATA:  1. Left main coronary artery is normal.  2. Left anterior descending is totally occluded before the first      septal perforating branch.  There are left-to-left collaterals with      intermittent filling of the left anterior  descending as well as      right to left collaterals.  In a somewhat intermittent fashion, we      have filling of the distal left anterior descending.  3. Left circumflex continues predominantly as a relatively large,      trifurcating obtuse marginal branch.  There is 30% narrowing      approximately 2-cm from the origin.  At the level of the      trifurcation, there is another 30-40% narrowing.  There is some  scattered irregularities at the level of the trifurcation, but no      significant obstructive disease is present in the left circumflex.  4. Right coronary artery.  The right coronary artery is a very large      dominant vessel.  He has to large branches on the inferior wall.      There is a large posterior lateral branch and large posterior      descending vessel and then at least three smaller branches on the      inferior wall.  At the level of the ostium of the posterior      descending branch, there appears to be a 30% narrowing.  There is      collaterals to the left anterior descending predominantly through      the septal perforating branches.  5. Left main coronary artery is normal.   IMPRESSION:  1. Totally occluded left anterior descending proximally before the      first septal perforating branch.  2. Mild coronary atherosclerosis in the right coronary artery and left      circumflex with incomplete collateral flow to the left anterior      descending.   DISCUSSION:  In light of the reports of the echocardiogram with anterior  wall being nonfunctioning, I think that we are basically dealing with a  completed anterior myocardial infarction of relatively significant size.  Unless there are recurrent symptoms or objective signs of ischemia, I do  not think much will be accomplished with recanalization at this point 5  days out from his myocardial infarction.    Laboratory Data:  Chemistry  Recent Labs Lab 10/27/16 1443 10/30/16 0757 10/31/16 1236    NA 134* 136 137  K 3.0* 2.5* 3.3*  CL 88* 88* 91*  CO2 27 29 29   GLUCOSE 86 210* 135*  BUN 108* 118* 114*  CREATININE 5.64* 5.64* 5.60*  CALCIUM 9.7 9.8 9.3  GFRNONAA 8* 8* 8*  GFRAA 10* 10* 10*  ANIONGAP 19* 19* 17*     Recent Labs Lab 10/31/16 1236  ALBUMIN 3.4*   Hematology  Recent Labs Lab 10/27/16 1443 10/31/16 1236  WBC 9.4 7.2  RBC 3.92* 3.81*  HGB 12.1* 11.6*  HCT 36.1* 35.6*  MCV 92.1 93.4  MCH 30.9 30.4  MCHC 33.5 32.6  RDW 14.2 14.5  PLT 305 241   Cardiac Enzymes  Recent Labs Lab 10/31/16 1236  TROPONINI 0.08*   No results for input(s): TROPIPOC in the last 168 hours.  BNPNo results for input(s): BNP, PROBNP in the last 168 hours.  DDimer No results for input(s): DDIMER in the last 168 hours.  Radiology/Studies:  Dg Chest 2 View  Result Date: 10/31/2016 CLINICAL DATA:  Shortness of breath on exertion. Chest tightness. Insertion of peritoneal dialysis catheter yesterday. EXAM: CHEST  2 VIEW COMPARISON:  08/27/2015 FINDINGS: Surgical changes in the lower cervical spine and in the thoracolumbar spine. The lungs are clear. Heart size is upper limits of normal but stable. Extensive degenerative disease in both shoulders. Atherosclerotic calcifications at the aortic arch. No large pleural effusions. Small amount of lucency underneath the right hemidiaphragm. Findings compatible with intraperitoneal air from recent catheter placement. IMPRESSION: No active cardiopulmonary disease. Small amount of free intraperitoneal air. Findings compatible with recent peritoneal catheter insertion. Electronically Signed   By: Markus Daft M.D.   On: 10/31/2016 13:12     Assessment & Plan    1. Dizziness/ Blurred Vision/ Bradycardia - underwent placement of  a PD catheter yesterday and was going to be discharged this AM but developed dizziness and blurred vision. This occurred following administration of his AM medications (including Lopressor, Hydralazine, and Imdur). No  associated chest pain, dyspnea, palpitations, headaches, weakness, or slurred speech.  - labs show WBC at 7.2, Hgb 11.6, platelets 241, Na+ 137, K+ 3.3, creatinine 5.60. Initial troponin checked and 0.08. EKG showed sinus bradycardia, HR 47, and bifascicular block (similar to prior tracings). Orthostatic vitals negative.  - connected to telemetry and HR currently in the 50's. Would continue to hold Lopressor and monitor on telemetry. Resume at lower dosing. Also on Hydralazine 75mg  TID and Imdur 60mg  daily for which Hydralazine could be reduced to 50mg  TID and followed as an outpatient.  2. CAD/ Elevated Troponin - cath in 2009 showed a completed MI with occlusion of the LAD which was medically treated). He was evaluated in clinic during the Spring of this year for symptoms concerning for UA but medical management was pursued with titration of Imdur due to his CKD. - recently underwent surgery and troponin values were checked this AM in the setting of blurred vision and dizziness. Initial troponin 0.08. EKG showed sinus bradycardia, HR 47, and bifascicular block (similar to prior tracings).  - continue to trend troponin but a flat trend would be consistent with demand ischemia in the setting of his known CAD and Stage 5 CKD.  - continue ASA. Intolerant to statin therapy. Recommend resuming Plavix once safe to do so from surgery's perspective.    3. Chronic Combined Systolic and Diastolic CHF - EF 93-81% by echo in 02/2013. - he denies any recent dyspnea, orthopnea, or PND. Just received 560mL IVF bolus in the setting of hypotension.  - BB held as above. Pending telemetry, consider restarting at BB therapy at a lower dose and switching to Toprol-XL with his reduced EF. On Hydralazine/Imdur as an outpatient with both currently held. Can reduce Hydralazine dosing to 50mg  TID upon resumption of this and follow in clinic.   4. Stage 5 CKD - just underwent placement of PD catheter.  - Nephrology and Dr.  Johney Maine are following.   5. Hypokalemia - K+ 3.5 on admission. Replaced and at 3.3 on most recent check (scheduled to receive additional supplementation).   Signed, Erma Heritage, PA-C 10/31/2016, 4:24 PM Pager: [phone]

## 2016-10-31 NOTE — Anesthesia Postprocedure Evaluation (Signed)
Anesthesia Post Note  Patient: OFFIE WAIDE  Procedure(s) Performed: LAPAROSCOPIC INSERTION CONTINUOUS AMBULATORY PERITONEAL DIALYSIS  (CAPD) CATHETER   OMENTOPEXY (N/A Abdomen)     Patient location during evaluation: PACU Anesthesia Type: General Level of consciousness: awake and alert Pain management: pain level controlled Vital Signs Assessment: post-procedure vital signs reviewed and stable Respiratory status: spontaneous breathing, nonlabored ventilation and respiratory function stable Cardiovascular status: blood pressure returned to baseline and stable Postop Assessment: no apparent nausea or vomiting Anesthetic complications: no    Last Vitals:  Vitals:   10/31/16 1334 10/31/16 1814  BP: (!) 90/51 (!) 109/56  Pulse: (!) 45 (!) 46  Resp: 18   Temp: 36.9 C   SpO2: 94% 96%    Last Pain:  Vitals:   10/31/16 1334  TempSrc: Oral  PainSc:                  Catalina Gravel

## 2016-10-31 NOTE — Discharge Summary (Addendum)
Physician Discharge Summary  Patient ID: [name] MRN: [id number] DOB/AGE: 1928-12-31  83 y.o.  Admit date: 10/30/2016 Discharge date: 10/31/2016   Patient Care Team: Celene Squibb, MD as PCP - General (Internal Medicine) Fran Lowes, MD as Consulting Physician (Nephrology) Martinique, Peter M, MD as Consulting Physician (Cardiology) Glenna Fellows, MD as Attending Physician (Neurosurgery) Gaynelle Arabian, MD as Consulting Physician (Orthopedic Surgery)  Discharge Diagnoses:  Principal Problem:   CKD (chronic kidney disease) stage 5, needing CAPD Dialysis Active Problems:   Chronic combined systolic and diastolic CHF (congestive heart failure) ([place])   Cardiomyopathy, ischemic- EF 45% 2D 02/27/13   Moderate persistent asthma in adult without complication   1 Day Post-Op  10/30/2016  POST-OPERATIVE DIAGNOSIS:  end stage renal disease, dialysis dependent  PROCEDURE:  LAPAROSCOPIC INSERTION CONTINUOUS AMBULATORY PERITONEAL DIALYSIS  (CAPD) CATHETER OMENTOPEXY  SURGEON:  Adin Hector, MD   Consults: None  Hospital Course:   The patient underwent the surgery above.  Postoperatively, the patient gradually mobilized and advanced to a solid diet.  Pain and other symptoms were treated aggressively.    By the time of discharge, the patient was eating food, having flatus.  Pain was well-controlled on an oral medications.  Based on meeting discharge criteria and continuing to recover, I felt it was safe for the patient to be discharged from the hospital to further recover with close followup. Postoperative recommendations were discussed in detail to patient & his wife.  They are written as well.  Discharged Condition: fair  Disposition:  Follow-up Information    Martinique, Peter M, MD. Schedule an appointment as soon as possible for a visit in 2 months.   Specialty:  Cardiology Contact information: [address] [place] [address] [phone]         Call Michael Boston, MD.   Specialty:  General Surgery Why:  See [place] only as needed Contact information: [place] [address] [phone]        Fran Lowes, MD. Schedule an appointment as soon as possible for a visit in 3 days.   Specialty:  Nephrology Why:  For dressing removal & flushing of CAPD catheter.  Call [facility] information: [address] [place] [address] [phone]           01-Home or Self Care  Discharge Instructions    Call MD for:    Complete by:  As directed    FEVER >101.5 F (Temperatures <101.60F occasionally happen and are not significant)   Call MD for:    Complete by:  As directed    FEVER > 101.5 F  (temperatures < 101.5 F are not significant)   Call MD for:  extreme fatigue    Complete by:  As directed    Call MD for:  extreme fatigue    Complete by:  As directed    Call MD for:  persistant dizziness or light-headedness    Complete by:  As directed    Call MD for:  persistant dizziness or light-headedness    Complete by:  As directed    Call MD for:  persistant nausea and vomiting    Complete by:  As directed    Call MD for:  persistant nausea and vomiting    Complete by:  As directed    Call MD for:  redness, tenderness, or signs of infection (pain, swelling, redness, odor or green/yellow discharge around incision site)  Complete by:  As directed    Call MD for:  redness, tenderness, or signs of infection (pain, swelling, redness, odor or green/yellow discharge around incision site)    Complete by:  As directed    Call MD for:  severe uncontrolled pain    Complete by:  As directed    Call MD for:  severe uncontrolled pain    Complete by:  As directed    Diet - low sodium heart healthy    Complete by:  As directed    Follow a light diet the first few days at home.  Start with a bland diet such as soups, liquids, starchy foods, low fat foods, etc.   If you feel full,  bloated, or constipated, stay on a full liquid or pureed/blenderized diet for a few days until you feel better and no longer constipated. Gradually get back to a regular solid diet.  Avoid fast food or heavy meals the first week as you are more likely to get nauseated.   Diet - low sodium heart healthy    Complete by:  As directed    Follow a light diet the first few days at home.   Start with a bland diet such as soups, liquids, starchy foods, low fat foods, etc.   If you feel full, bloated, or constipated, stay on a full liquid or pureed/blenderized diet for a few days until you feel better and no longer constipated. Be sure to drink plenty of fluids every day to avoid getting dehydrated (feeling dizzy, not urinating, etc.). Gradually add a fiber supplement to your diet   Discharge instructions    Complete by:  As directed    One the day of your discharge from the hospital (or the next business weekday), please call [facility] to set up or confirm an appointment to see your surgeon in the office for a follow-up appointment.  Usually it is 2-3 weeks after your surgery.  Other concerns If you are not getting better after two weeks or are noticing you are getting worse, contact our office [phone] for further advice.  We may need to adjust your medications, re-evaluate you in the office, send you to the emergency room, or see what other things we can do to help. The clinic staff is available to answer your questions during regular business hours (8:30am-5pm).  Please don't hesitate to call and ask to speak to one of our nurses for clinical concerns.    A surgeon from [facility] is always on call at the hospitals 24 hours/day If you have a medical emergency, go to the nearest emergency room or call 911.   Discharge instructions    Complete by:  As directed    See Discharge Instructions If you are not getting better after two weeks or are noticing you are getting  worse, contact our office [phone] for further advice.  We may need to adjust your medications, re-evaluate you in the office, send you to the emergency room, or see what other things we can do to help. The clinic staff is available to answer your questions during regular business hours (8:30am-5pm).  Please don't hesitate to call and ask to speak to one of our nurses for clinical concerns.    A surgeon from [facility] is always on call at the hospitals 24 hours/day If you have a medical emergency, go to the nearest emergency room or call 911.   Driving Restrictions  Complete by:  As directed    You may drive when you are no longer taking prescription pain medication, you can comfortably wear a seatbelt, and you can safely maneuver your car and apply brakes.   Driving Restrictions    Complete by:  As directed    You may drive when you are no longer taking narcotic prescription pain medication, you can comfortably wear a seatbelt, and you can safely make sudden turns/stops to protect yourself without hesitating due to pain.   Increase activity slowly    Complete by:  As directed    Increase activity slowly    Complete by:  As directed    Start light daily activities --- self-care, walking, climbing stairs- beginning the day after surgery.  Gradually increase activities as tolerated.  Control your pain to be active.  Stop when you are tired.  Ideally, walk several times a day, eventually an hour a day.   Most people are back to most day-to-day activities in a few weeks.  It takes 4-8 weeks to get back to unrestricted, intense activity. If you can walk 30 minutes without difficulty, it is safe to try more intense activity such as jogging, treadmill, bicycling, low-impact aerobics, swimming, etc. Save the most intensive and strenuous activity for last (Usually 4-8 weeks after surgery) such as sit-ups, heavy lifting, contact sports, etc.  Refrain from any intense heavy lifting or  straining until you are off narcotics for pain control.  You will have off days, but things should improve week-by-week. DO NOT PUSH THROUGH PAIN.  Let pain be your guide: If it hurts to do something, don't do it.  Pain is your body warning you to avoid that activity for another week until the pain goes down.   Lifting restrictions    Complete by:  As directed    You may resume regular (light) daily activities beginning the next day-such as daily self-care, walking, climbing stairs-gradually increasing activities as tolerated.   If you can walk 30 minutes without difficulty, it is safe to try more intense activity such as jogging, treadmill, bicycling, low-impact aerobics, swimming, etc. Save the most intensive and strenuous activity for last such as sit-ups, heavy lifting, contact sports, etc   Refrain from any heavy lifting or straining until you are off narcotics for pain control.   DO NOT PUSH THROUGH PAIN.   Let pain be your guide: If it hurts to do something, don't do it.   Pain is your body warning you to avoid that activity for another week until the pain goes down.   Lifting restrictions    Complete by:  As directed    If you can walk 30 minutes without difficulty, it is safe to try more intense activity such as jogging, treadmill, bicycling, low-impact aerobics, swimming, etc. Save the most intensive and strenuous activity for last (Usually 4-8 weeks after surgery) such as sit-ups, heavy lifting, contact sports, etc.  Refrain from any intense heavy lifting or straining until you are off narcotics for pain control.  You will have off days, but things should improve week-by-week. DO NOT PUSH THROUGH PAIN.  Let pain be your guide: If it hurts to do something, don't do it.  Pain is your body warning you to avoid that activity for another week until the pain goes down.   May shower / Bathe    Complete by:  As directed    Wash / shower every day.  You may shower over the dressings as  they are  waterproof.  Continue to shower over incision(s) after the dressing is off.   May walk up steps    Complete by:  As directed    May walk up steps    Complete by:  As directed    No wound care    Complete by:  As directed    It is good for closed incision and even open wounds to be washed every day.  Shower every day.  Short baths are fine.  Wash the incisions and wounds clean with soap & water.    If you have a closed incision(s), wash the incision with soap & water every day.  You may leave closed incisions open to air if it is dry.   You may cover the incision with clean gauze & replace it after your daily shower for comfort. If you have skin tapes (Steristrips) or skin glue (Dermabond) on your incision, leave them in place.  They will fall off on their own like a scab.  You may trim any edges that curl up with clean scissors.  If you have staples, set up an appointment for them to be removed in the office in 10 days after surgery.  If you have a drain, wash around the skin exit site with soap & water and place a new dressing of gauze or band aid around the skin every day.  Keep the drain site clean & dry.   Remove dressing in 72 hours    Complete by:  As directed    Remove your waterproof dressings, skin tapes, and other bandages 3-5 days after surgery.   You may leave the incision(s) open to air.   You may replace a dressing/Band-Aid to cover the incision for comfort if you wish.   Sexual Activity Restrictions    Complete by:  As directed    You may have sexual intercourse when it is comfortable. If it hurts to do something, stop.   Sexual Activity Restrictions    Complete by:  As directed    You may have sexual intercourse when it is comfortable. If it hurts to do something, stop.      Allergies as of 10/31/2016      Reactions   Gentamycin [gentamicin] Other (See Comments)   Decreased kidney fx   Crestor [rosuvastatin Calcium] Other (See Comments)   MUSCLE PAIN   Hctz  [hydrochlorothiazide] Other (See Comments)   dizziness   Lipitor [atorvastatin Calcium] Other (See Comments)   MUSCLE PAIN      Medication List    TAKE these medications   albuterol (2.5 MG/3ML) 0.083% nebulizer solution Commonly known as:  PROVENTIL INHALE EVERY 4-6HRS AS NEEDED FOR WHEEZING/SHORTNESS OF BREATH   allopurinol 100 MG tablet Commonly known as:  ZYLOPRIM Take one half tablet by mouth once daily. ADDITIONAL REFILLS FROM PCP What changed:  how much to take  how to take this  when to take this  additional instructions   aspirin 81 MG tablet Take 81 mg by mouth every other day.   clopidogrel 75 MG tablet Commonly known as:  PLAVIX TAKE 1 TABLET BY MOUTH EVERY DAY What changed:  See the new instructions.   furosemide 80 MG tablet Commonly known as:  LASIX Take 80 mg by mouth daily.   hydrALAZINE 50 MG tablet Commonly known as:  APRESOLINE Take 1.5 tablets (75 mg total) by mouth 3 (three) times daily.   isosorbide mononitrate 60 MG 24 hr tablet Commonly known as:  IMDUR Take 1 tablet (60 mg total) by mouth daily.   metolazone 2.5 MG tablet Commonly known as:  ZAROXOLYN Take 2.5 mg by mouth daily as needed (for fluid).   metoprolol tartrate 25 MG tablet Commonly known as:  LOPRESSOR Take 25 mg by mouth 2 (two) times daily.   nitroGLYCERIN 0.4 MG SL tablet Commonly known as:  NITROSTAT Place 1 tablet (0.4 mg total) under the tongue every 5 (five) minutes as needed for chest pain (chest pain).   SOOTHE OP Place 1 drop into both eyes as needed (for dry eyes).   traMADol 50 MG tablet Commonly known as:  ULTRAM Take 1 tablet (50 mg total) by mouth every 6 (six) hours as needed for moderate pain or severe pain.   triamcinolone ointment 0.1 % Commonly known as:  KENALOG Apply 1 application topically daily as needed (for itchy skin).       Significant Diagnostic Studies:  Results for orders placed or performed during the hospital encounter of  10/30/16 (from the past 72 hour(s))  Basic metabolic panel     Status: Abnormal   Collection Time: 10/30/16  7:57 AM  Result Value Ref Range   Sodium 136 135 - 145 mmol/L   Potassium 2.5 (LL) 3.5 - 5.1 mmol/L    Comment: REPEATED TO VERIFY CRITICAL RESULT CALLED TO, READ BACK BY AND VERIFIED WITH: MARISSA SERANO,RN AT 4166 10/30/16 BY ZBEECH.    Chloride 88 (L) 101 - 111 mmol/L   CO2 29 22 - 32 mmol/L   Glucose, Bld 210 (H) 65 - 99 mg/dL   BUN 118 (H) 6 - 20 mg/dL   Creatinine, Ser 5.64 (H) 0.61 - 1.24 mg/dL   Calcium 9.8 8.9 - 10.3 mg/dL   GFR calc non Af Amer 8 (L) >60 mL/min   GFR calc Af Amer 10 (L) >60 mL/min    Comment: (NOTE) The eGFR has been calculated using the CKD EPI equation. This calculation has not been validated in all clinical situations. eGFR's persistently <60 mL/min signify possible Chronic Kidney Disease.    Anion gap 19 (H) 5 - 15    No results found.  Discharge Exam: Blood pressure 110/72, pulse 61, temperature 98.3 F (36.8 C), temperature source Oral, resp. rate 16, height '5\' 3"'$  (1.6 m), weight 80.7 kg (177 lb 14.4 oz), SpO2 96 %.  General: Pt awake/alert/oriented x4 in No acute distress Eyes: PERRL, normal EOM.  Sclera clear.  No icterus Neuro: CN II-XII intact w/o focal sensory/motor deficits. Lymph: No head/neck/groin lymphadenopathy Psych:  No delerium/psychosis/paranoia HENT: Normocephalic, Mucus membranes moist.  No thrush Neck: Supple, No tracheal deviation Chest: No chest wall pain w good excursion CV:  Pulses intact.  Regular rhythm MS: Normal AROM mjr joints.  No obvious deformity Abdomen: Soft.  Nondistended.  Mildly tender at incisions only.  No evidence of peritonitis.  No incarcerated hernias. Ext:  SCDs BLE.  No mjr edema.  No cyanosis Skin: No petechiae / purpura  Past Medical History:  Diagnosis Date  . Acute respiratory failure with hypoxia ([place]) 03/03/2013  . Asthma   . CAD (coronary artery disease)- MI in '09 02/28/2013  .  Cancer ([place])    lymphoma  1979    . CHF (congestive heart failure) ([place])    2015  . Chronic renal insufficiency    creatinine ranges around 2.8  . Complication of anesthesia    No anesthesia complications, but concerns about neck positioning due to cervical disease  . DOE (dyspnea  on exertion)   . Dyspnea    upon exertion  . Gout   . HCAP (healthcare-associated pneumonia) 03/03/2013  . Herpes zoster 12/2009   left eye ..........last flare up "a long time"  . History of BPH   . HOH (hard of hearing)    hears well out of left ear  . Hypertension   . IHD (ischemic heart disease)    Remote MI in 2009 with late presentation; no reperfusion. Has large area of infarct in the apical anterior area on last nuclear in 2011. Managed medically  . Influenza A 03/03/2013  . MI, old 03/2007   ACUTE ANTEROSEPTAL  . NSTEMI (non-ST elevated myocardial infarction)- type 2 02/28/2013  . OA (osteoarthritis) of knee    Left knee with revision and replacement of prosthetic L TKR  . Peripheral edema   . RBBB (right bundle branch block)   . Renal insufficiency   . Staph infection    states that gentamycin & vancomycin, (30 - 45 days worth) are what did his kidneys in, per his daughter    Past Surgical History:  Procedure Laterality Date  . BACK SURGERY     has had 3 surgeries on his back  . BILATERAL CARPAL TUNNEL RELEASE    . BUNIONECTOMY    . CARDIAC CATHETERIZATION  03/24/2007  . CARDIOVASCULAR STRESS TEST  04/02/2009   EF 27% AND A LARGE AREA OF INFARCT IN THE APICAL ANTERIOR WITH MILD PERI-INFARCT ISCHEMIA  . FRACTURE SURGERY     both ankles  . INSERTION OF DIALYSIS CATHETER  10/30/2016   LAPAROSCOPIC INSERTION CONTINUOUS AMBULATORY PERITONEAL DIALYSIS  (CAPD) CATHETER   OMENTOPEXY  . JOINT REPLACEMENT     3 on right hip, 2 on knee  . KNEE SURGERY  1998   left knee  . neck surgeries (x2)    . TOE AMPUTATION     LEFT FOOT  . TOTAL KNEE ARTHROPLASTY     left knee  . TRANSTHORACIC  ECHOCARDIOGRAM  04/04/2009   EF 45-50%  . TRANSURETHRAL RESECTION OF PROSTATE      Social History   Social History  . Marital status: Married    Spouse name: N/A  . Number of children: N/A  . Years of education: N/A   Occupational History  . Retired [place] Topics  . Smoking status: Never Smoker  . Smokeless tobacco: Never Used  . Alcohol use No  . Drug use: No  . Sexual activity: Not on file   Other Topics Concern  . Not on file   Social History Narrative  . No narrative on file    Family History  Problem Relation Age of Onset  . Coronary artery disease Mother   . Emphysema Father        never smoked, worked as a Psychologist, sport and exercise    Current Facility-Administered Medications  Medication Dose Route Frequency Provider Last Rate Last Dose  . 0.9 %  sodium chloride infusion   Intravenous Continuous Michael Boston, MD 50 mL/hr at 10/30/16 1600    . 0.9 %  sodium chloride infusion  250 mL Intravenous PRN Michael Boston, MD      . acetaminophen (TYLENOL) tablet 1,000 mg  1,000 mg Oral Tor Netters, MD   1,000 mg at 10/31/16 0551  . albuterol (PROVENTIL) (2.5 MG/3ML) 0.083% nebulizer solution 2.5 mg  2.5 mg Nebulization Q6H PRN Michael Boston, MD      . allopurinol (ZYLOPRIM) tablet 50 mg  50 mg Oral Daily Michael Boston, MD      . aspirin chewable tablet 81 mg  81 mg Oral Carlis Abbott, MD      . bisacodyl (DULCOLAX) suppository 10 mg  10 mg Rectal Daily PRN Michael Boston, MD      . chlorhexidine (PERIDEX) 0.12 % solution 15 mL  15 mL Mouth Rinse BID Michael Boston, MD      . diphenhydrAMINE (BENADRYL) 12.5 MG/5ML elixir 12.5 mg  12.5 mg Oral Q6H PRN Michael Boston, MD       Or  . diphenhydrAMINE (BENADRYL) injection 12.5 mg  12.5 mg Intravenous Q6H PRN Michael Boston, MD      . enoxaparin (LOVENOX) injection 30 mg  30 mg Subcutaneous Q24H Michael Boston, MD      . fentaNYL (SUBLIMAZE) injection 25-50 mcg  25-50 mcg Intravenous Q1H PRN Michael Boston, MD       . furosemide (LASIX) tablet 80 mg  80 mg Oral Daily Michael Boston, MD      . hydrALAZINE (APRESOLINE) injection 5-20 mg  5-20 mg Intravenous Q4H PRN Michael Boston, MD      . hydrALAZINE (APRESOLINE) tablet 75 mg  75 mg Oral TID Michael Boston, MD      . isosorbide mononitrate (IMDUR) 24 hr tablet 60 mg  60 mg Oral Daily Michael Boston, MD      . lactated ringers bolus 1,000 mL  1,000 mL Intravenous Q8H PRN Michael Boston, MD      . lactated ringers infusion 1,000 mL  1,000 mL Intravenous Q8H PRN Shanena Pellegrino, Remo Lipps, MD      . lip balm (BLISTEX) ointment 1 application  1 application Topical BID Michael Boston, MD      . magic mouthwash  15 mL Oral QID PRN Michael Boston, MD      . MEDLINE mouth rinse  15 mL Mouth Rinse Tana Conch, MD      . methocarbamol (ROBAXIN) tablet 500 mg  500 mg Oral Q6H PRN Michael Boston, MD   500 mg at 10/30/16 2033  . metolazone (ZAROXOLYN) tablet 2.5 mg  2.5 mg Oral Daily PRN Michael Boston, MD      . metoprolol tartrate (LOPRESSOR) tablet 25 mg  25 mg Oral BID Michael Boston, MD   25 mg at 10/30/16 2303  . nitroGLYCERIN (NITROSTAT) SL tablet 0.4 mg  0.4 mg Sublingual Q5 min PRN Michael Boston, MD      . ondansetron (ZOFRAN-ODT) disintegrating tablet 4 mg  4 mg Oral Q6H PRN Michael Boston, MD       Or  . ondansetron (ZOFRAN) injection 4 mg  4 mg Intravenous Q6H PRN Michael Boston, MD      . polyethylene glycol (MIRALAX / GLYCOLAX) packet 17 g  17 g Oral Q12H PRN Michael Boston, MD      . polyvinyl alcohol (LIQUIFILM TEARS) 1.4 % ophthalmic solution 1 drop  1 drop Both Eyes PRN Michael Boston, MD      . simethicone (MYLICON) chewable tablet 40 mg  40 mg Oral Q6H PRN Michael Boston, MD      . sodium chloride flush (NS) 0.9 % injection 3 mL  3 mL Intravenous Gorden Harms, MD      . sodium chloride flush (NS) 0.9 % injection 3 mL  3 mL Intravenous PRN Michael Boston, MD      . traMADol Veatrice Bourbon) tablet 50-100 mg  50-100 mg Oral Q6H PRN Michael Boston, MD  Allergies   Allergen Reactions  . Gentamycin [Gentamicin] Other (See Comments)    Decreased kidney fx  . Crestor [Rosuvastatin Calcium] Other (See Comments)    MUSCLE PAIN  . Hctz [Hydrochlorothiazide] Other (See Comments)    dizziness  . Lipitor [Atorvastatin Calcium] Other (See Comments)    MUSCLE PAIN    Signed: Morton Peters, M.D., F.A.C.S. Gastrointestinal [facility]ally [facility]vasive [facility] [address], [place], [place] [address] [phone] Main / Paging   10/31/2016, 7:35 AM

## 2016-10-31 NOTE — Consult Note (Addendum)
Referring Provider: No ref. provider found Primary Care Physician:  Celene Squibb, MD Primary Nephrologist:  Dr. Gweneth Dimitri  Reason for Consultation:   End stage renal disease s/p placement of PD catheter by Dr Johney Maine  Patient was to be discharged and felt very weak and dizzy. Asked to assist in the medical management of this patient   HPI:  End Stage Renal Disease  History of hypertension and Coronary Artery Disease. He has a history of MI and Congestive heart failure with an EF 45 % No complaints of shortness of breath or chest pain  No orthopnea and no ankle or leg swelling Does have some abdominal distention and bloated feeling post operatively Has concerns about going back home with the power outages  No vomiting no diarrhea noted by patient  Daughters Kathlee [place] and [place] at bedside as well as wife Collie Siad  Past Medical History:  Diagnosis Date  . Acute respiratory failure with hypoxia ([place]) 03/03/2013  . Asthma   . CAD (coronary artery disease)- MI in '09 02/28/2013  . Cancer ([place])    lymphoma  1979    . CHF (congestive heart failure) ([place])    2015  . Chronic renal insufficiency    creatinine ranges around 2.8  . Complication of anesthesia    No anesthesia complications, but concerns about neck positioning due to cervical disease  . DOE (dyspnea on exertion)   . Dyspnea    upon exertion  . Gout   . HCAP (healthcare-associated pneumonia) 03/03/2013  . Herpes zoster 12/2009   left eye ..........last flare up "a long time"  . History of BPH   . HOH (hard of hearing)    hears well out of left ear  . Hypertension   . IHD (ischemic heart disease)    Remote MI in 2009 with late presentation; no reperfusion. Has large area of infarct in the apical anterior area on last nuclear in 2011. Managed medically  . Influenza A 03/03/2013  . MI, old 03/2007   ACUTE ANTEROSEPTAL  . NSTEMI (non-ST elevated myocardial infarction)- type 2 02/28/2013  . OA (osteoarthritis) of knee    Left knee with revision  and replacement of prosthetic L TKR  . Peripheral edema   . RBBB (right bundle branch block)   . Renal insufficiency   . Staph infection    states that gentamycin & vancomycin, (30 - 45 days worth) are what did his kidneys in, per his daughter    Past Surgical History:  Procedure Laterality Date  . BACK SURGERY     has had 3 surgeries on his back  . BILATERAL CARPAL TUNNEL RELEASE    . BUNIONECTOMY    . CARDIAC CATHETERIZATION  03/24/2007  . CARDIOVASCULAR STRESS TEST  04/02/2009   EF 27% AND A LARGE AREA OF INFARCT IN THE APICAL ANTERIOR WITH MILD PERI-INFARCT ISCHEMIA  . FRACTURE SURGERY     both ankles  . INSERTION OF DIALYSIS CATHETER  10/30/2016   LAPAROSCOPIC INSERTION CONTINUOUS AMBULATORY PERITONEAL DIALYSIS  (CAPD) CATHETER   OMENTOPEXY  . JOINT REPLACEMENT     3 on right hip, 2 on knee  . KNEE SURGERY  1998   left knee  . neck surgeries (x2)    . TOE AMPUTATION     LEFT FOOT  . TOTAL KNEE ARTHROPLASTY     left knee  . TRANSTHORACIC ECHOCARDIOGRAM  04/04/2009   EF 45-50%  . TRANSURETHRAL RESECTION OF PROSTATE      Prior to Admission medications  Medication Sig Start Date End Date Taking? Authorizing Provider  albuterol (PROVENTIL) (2.5 MG/3ML) 0.083% nebulizer solution INHALE EVERY 4-6HRS AS NEEDED FOR WHEEZING/SHORTNESS OF BREATH 10/01/16  Yes [provider]  allopurinol (ZYLOPRIM) 100 MG tablet Take one half tablet by mouth once daily. ADDITIONAL REFILLS FROM PCP Patient taking differently: Take 50 mg by mouth daily.  02/05/15  Yes Darlin Coco, MD  aspirin 81 MG tablet Take 81 mg by mouth every other day.     Yes [provider]  clopidogrel (PLAVIX) 75 MG tablet TAKE 1 TABLET BY MOUTH EVERY DAY Patient taking differently: TAKE 75 MG BY MOUTH EVERY DAY 09/08/16  Yes Martinique, Peter M, MD  furosemide (LASIX) 80 MG tablet Take 80 mg by mouth daily.    Yes [provider]  hydrALAZINE (APRESOLINE) 50 MG tablet Take 1.5 tablets (75 mg  total) by mouth 3 (three) times daily. 03/01/13  Yes Kilroy, Luke K, PA-C  isosorbide mononitrate (IMDUR) 60 MG 24 hr tablet Take 1 tablet (60 mg total) by mouth daily. 05/05/16 10/21/16 Yes Weaver, Scott T, PA-C  metolazone (ZAROXOLYN) 2.5 MG tablet Take 2.5 mg by mouth daily as needed (for fluid).   Yes [provider]  metoprolol tartrate (LOPRESSOR) 25 MG tablet Take 25 mg by mouth 2 (two) times daily.  04/27/15  Yes [provider]  Propylene Glycol-Glycerin (SOOTHE OP) Place 1 drop into both eyes as needed (for dry eyes).   Yes [provider]  triamcinolone ointment (KENALOG) 0.1 % Apply 1 application topically daily as needed (for itchy skin).  10/17/14  Yes [provider]  nitroGLYCERIN (NITROSTAT) 0.4 MG SL tablet Place 1 tablet (0.4 mg total) under the tongue every 5 (five) minutes as needed for chest pain (chest pain). 05/02/16   Martinique, Peter M, MD  traMADol (ULTRAM) 50 MG tablet Take 1 tablet (50 mg total) by mouth every 6 (six) hours as needed for moderate pain or severe pain. 10/30/16   Michael Boston, MD    Current Facility-Administered Medications  Medication Dose Route Frequency Provider Last Rate Last Dose  . 0.9 %  sodium chloride infusion  250 mL Intravenous PRN Michael Boston, MD      . acetaminophen (TYLENOL) tablet 1,000 mg  1,000 mg Oral Tor Netters, MD   1,000 mg at 10/31/16 0551  . albuterol (PROVENTIL) (2.5 MG/3ML) 0.083% nebulizer solution 2.5 mg  2.5 mg Nebulization Q6H PRN Michael Boston, MD      . allopurinol (ZYLOPRIM) tablet 50 mg  50 mg Oral Daily Michael Boston, MD   50 mg at 10/31/16 1029  . aspirin chewable tablet 81 mg  81 mg Oral Carlis Abbott, MD   81 mg at 10/31/16 1029  . bisacodyl (DULCOLAX) suppository 10 mg  10 mg Rectal Daily PRN Michael Boston, MD      . chlorhexidine (PERIDEX) 0.12 % solution 15 mL  15 mL Mouth Rinse BID Michael Boston, MD   15 mL at 10/31/16 1031  . diphenhydrAMINE (BENADRYL) 12.5 MG/5ML elixir  12.5 mg  12.5 mg Oral Q6H PRN Michael Boston, MD       Or  . diphenhydrAMINE (BENADRYL) injection 12.5 mg  12.5 mg Intravenous Q6H PRN Michael Boston, MD      . enoxaparin (LOVENOX) injection 30 mg  30 mg Subcutaneous Q24H Michael Boston, MD   30 mg at 10/31/16 0854  . fentaNYL (SUBLIMAZE) injection 25-50 mcg  25-50 mcg Intravenous Q1H PRN Michael Boston, MD      .  furosemide (LASIX) tablet 80 mg  80 mg Oral Daily Michael Boston, MD   80 mg at 10/31/16 1030  . hydrALAZINE (APRESOLINE) injection 5-20 mg  5-20 mg Intravenous Q4H PRN Michael Boston, MD      . hydrALAZINE (APRESOLINE) tablet 75 mg  75 mg Oral TID Michael Boston, MD   75 mg at 10/31/16 1029  . isosorbide mononitrate (IMDUR) 24 hr tablet 60 mg  60 mg Oral Daily Michael Boston, MD   60 mg at 10/31/16 1031  . lactated ringers bolus 1,000 mL  1,000 mL Intravenous Q8H PRN Michael Boston, MD      . lactated ringers infusion 1,000 mL  1,000 mL Intravenous Q8H PRN Gross, Remo Lipps, MD      . lip balm (BLISTEX) ointment 1 application  1 application Topical BID Michael Boston, MD   1 application at 65/78/46 1030  . magic mouthwash  15 mL Oral QID PRN Michael Boston, MD      . MEDLINE mouth rinse  15 mL Mouth Rinse Tana Conch, MD      . methocarbamol (ROBAXIN) tablet 500 mg  500 mg Oral Q6H PRN Michael Boston, MD   500 mg at 10/30/16 2033  . metolazone (ZAROXOLYN) tablet 2.5 mg  2.5 mg Oral Daily PRN Michael Boston, MD      . metoprolol tartrate (LOPRESSOR) tablet 25 mg  25 mg Oral BID Michael Boston, MD   25 mg at 10/31/16 1030  . nitroGLYCERIN (NITROSTAT) SL tablet 0.4 mg  0.4 mg Sublingual Q5 min PRN Michael Boston, MD      . ondansetron (ZOFRAN-ODT) disintegrating tablet 4 mg  4 mg Oral Q6H PRN Michael Boston, MD       Or  . ondansetron (ZOFRAN) injection 4 mg  4 mg Intravenous Q6H PRN Michael Boston, MD      . polyethylene glycol (MIRALAX / GLYCOLAX) packet 17 g  17 g Oral Q12H PRN Michael Boston, MD      . polyvinyl alcohol (LIQUIFILM TEARS) 1.4 %  ophthalmic solution 1 drop  1 drop Both Eyes PRN Michael Boston, MD      . simethicone (MYLICON) chewable tablet 40 mg  40 mg Oral Q6H PRN Michael Boston, MD      . sodium chloride flush (NS) 0.9 % injection 3 mL  3 mL Intravenous Gorden Harms, MD   3 mL at 10/31/16 1031  . sodium chloride flush (NS) 0.9 % injection 3 mL  3 mL Intravenous PRN Michael Boston, MD      . traMADol Veatrice Bourbon) tablet 50-100 mg  50-100 mg Oral Q6H PRN Michael Boston, MD        Allergies as of 10/09/2016 - Review Complete 09/20/2016  Allergen Reaction Noted  . Gentamycin [gentamicin] Other (See Comments) 08/06/2012  . Crestor [rosuvastatin calcium] Other (See Comments) 07/10/2010  . Hctz [hydrochlorothiazide] Other (See Comments) 03/14/2011  . Lipitor [atorvastatin calcium] Other (See Comments) 07/10/2010    Family History  Problem Relation Age of Onset  . Coronary artery disease Mother   . Emphysema Father        never smoked, worked as a Psychologist, sport and exercise    Social History   Social History  . Marital status: Married    Spouse name: N/A  . Number of children: N/A  . Years of education: N/A   Occupational History  . Retired [place] Topics  . Smoking status: Never Smoker  . Smokeless tobacco: Never Used  .  Alcohol use No  . Drug use: No  . Sexual activity: Not on file   Other Topics Concern  . Not on file   Social History Narrative  . No narrative on file    Review of Systems: Gen: Denies any fever, chills, sweats, anorexia,   + fatigue, + weakness, + malaise,   HEENT: Admitted to some diplopia this morning prior to discharge CV: Denies chest pain, angina, palpitations, syncope, orthopnea, PND, peripheral edema, and claudication. Resp: Denies dyspnea at rest, dyspnea with exercise, cough, sputum, wheezing, coughing up blood, and pleurisy. GI: Some distention noted and bloating  GU : Denies urinary burning, blood in urine, urinary frequency, urinary hesitancy, nocturnal  urination, and urinary incontinence.  No renal calculi. MS: Denies joint pain, limitation of movement, and swelling, stiffness, low back pain, extremity pain. Denies muscle weakness, cramps, atrophy.  No use of non steroidal antiinflammatory drugs. Derm: Denies rash, itching, dry skin, hives, moles, warts, or unhealing ulcers.     Physical Exam: Vital signs in last 24 hours: Temp:  [97 F (36.1 C)-98.3 F (36.8 C)] 98.2 F (36.8 C) (10/12 1015) Pulse Rate:  [59-65] 59 (10/12 1015) Resp:  [10-16] 16 (10/11 2123) BP: (93-139)/(53-75) 139/71 (10/12 1015) SpO2:  [95 %-100 %] 96 % (10/12 1100)   General:   Elderly man no distress conversant  Head:  Normocephalic and atraumatic. Eyes:  Sclera clear, no icterus.   Conjunctiva pink. Ears:  Normal auditory acuity. Nose:  No deformity, discharge,  or lesions. Mouth:  No deformity or lesions, dentition normal. Neck:  Supple; no masses or thyromegaly. JVP not elevated Lungs:  Clear throughout to auscultation.   No wheezes, crackles, or rhonchi. No acute distress. Heart:  Regular rate and rhythm; no murmurs, clicks, rubs,  or gallops. Abdomen:  Soft, nontender and distended. No masses, hepatosplenomegaly or hernias noted. Hypoactive bowel sounds  Msk:  Symmetrical without gross deformities. Normal posture. Pulses:  No carotid, renal, femoral bruits. DP and PT symmetrical and equal Extremities:  Without clubbing or edema. Neurologic:  Alert and  oriented x4;  grossly normal neurologically. Skin:  Intact without significant lesions or rashes.   Intake/Output from previous day: 10/11 0701 - 10/12 0700 In: 1300 [P.O.:350; I.V.:700; IV Piggyback:250] Out: -  Intake/Output this shift: No intake/output data recorded.  Lab Results: No results for input(s): WBC, HGB, HCT, PLT in the last 72 hours. BMET  Recent Labs  10/30/16 0757  NA 136  K 2.5*  CL 88*  CO2 29  GLUCOSE 210*  BUN 118*  CREATININE 5.64*  CALCIUM 9.8   LFT No results  for input(s): PROT, ALBUMIN, AST, ALT, ALKPHOS, BILITOT, BILIDIR, IBILI in the last 72 hours. PT/INR No results for input(s): LABPROT, INR in the last 72 hours. Hepatitis Panel No results for input(s): HEPBSAG, HCVAB, HEPAIGM, HEPBIGM in the last 72 hours.  Studies/Results: Dg Chest 2 View  Result Date: 10/31/2016 CLINICAL DATA:  Shortness of breath on exertion. Chest tightness. Insertion of peritoneal dialysis catheter yesterday. EXAM: CHEST  2 VIEW COMPARISON:  08/27/2015 FINDINGS: Surgical changes in the lower cervical spine and in the thoracolumbar spine. The lungs are clear. Heart size is upper limits of normal but stable. Extensive degenerative disease in both shoulders. Atherosclerotic calcifications at the aortic arch. No large pleural effusions. Small amount of lucency underneath the right hemidiaphragm. Findings compatible with intraperitoneal air from recent catheter placement. IMPRESSION: No active cardiopulmonary disease. Small amount of free intraperitoneal air. Findings compatible with recent peritoneal  catheter insertion. Electronically Signed   By: Markus Daft M.D.   On: 10/31/2016 13:12    Assessment/Plan: End  Stage Renal Disease  History of hypertension and Coronary Artery Disease. He has a history of MI and Congestive heart failure with an EF 45 % No complaints of shortness of breath or chest pain  No orthopnea and no ankle or leg swelling Does have some abdominal distention and bloated feeling post operatively Has concerns about going back home with the power outages   1. ESRD   Appreciate Dr Johney Maine  Placed PD catheter in patient 10/11  Will check labs prior to discharge 2. Volume  No appreciable volume overload 3. Anemia stable 4. Bones stable at this time 5. Weakness  Bifascicular block on EKG  Bradycardic   Will discontinue the metoprolol   Review labs  6. Hypotensive will hold antihypertensive medications for now    LOS: 0 Shreyan Hinz W @TODAY @1 :27 PM

## 2016-10-31 NOTE — Progress Notes (Signed)
Note: Portions of this report may have been transcribed using voice recognition software. Every effort was made to ensure accuracy; however, inadvertent computerized transcription errors may be present.   Any transcriptional errors that result from this process are unintentional.    Called by nurse, four hours after I discharged him.  Apparently patient feels lightheaded.  Has some double vision.  He is uncomfortable going home.  Blood pressure fine and stable.  I recommend orthostatic vital signs.  I called nephrology.  Discussed with Dr. Edrick Oh.  We will get lab work and have nephrology evaluate the patient.  Challenge is is this patient is rather deconditioned already.  Make sure were not missing anything.  Most likely can leave later today if everything else is underwhelming and patient feels better.         [name]  [birth year] [id number]  Patient Care Team: Celene Squibb, MD as PCP - General (Internal Medicine) Fran Lowes, MD as Consulting Physician (Nephrology) Martinique, Peter M, MD as Consulting Physician (Cardiology) Glenna Fellows, MD as Attending Physician (Neurosurgery) Gaynelle Arabian, MD as Consulting Physician (Orthopedic Surgery)  *  Patient Active Problem List   Diagnosis Date Noted  . Essential hypertension 02/06/2015  . Moderate persistent asthma in adult without complication 62/95/2841  . Cardiomyopathy, ischemic- EF 45% 2D 02/27/13 03/01/2013  . [place] thrombus of heart-old, no need for anticoagulation 02/28/2013  . Chronic combined systolic and diastolic CHF (congestive heart failure) ([place]) 02/28/2013  . CKD (chronic kidney disease) stage 5, needing CAPD Dialysis 02/26/2013  . Benign prostate hyperplasia 11/24/2011  . Osteoarthritis 07/15/2010  . Right bundle branch block 07/15/2010    Past Medical History:  Diagnosis Date  . Acute respiratory failure with hypoxia ([place]) 03/03/2013  . Asthma   . CAD (coronary artery disease)- MI in '09 02/28/2013  .  Cancer ([place])    lymphoma  1979    . CHF (congestive heart failure) ([place])    2015  . Chronic renal insufficiency    creatinine ranges around 2.8  . Complication of anesthesia    No anesthesia complications, but concerns about neck positioning due to cervical disease  . DOE (dyspnea on exertion)   . Dyspnea    upon exertion  . Gout   . HCAP (healthcare-associated pneumonia) 03/03/2013  . Herpes zoster 12/2009   left eye ..........last flare up "a long time"  . History of BPH   . HOH (hard of hearing)    hears well out of left ear  . Hypertension   . IHD (ischemic heart disease)    Remote MI in 2009 with late presentation; no reperfusion. Has large area of infarct in the apical anterior area on last nuclear in 2011. Managed medically  . Influenza A 03/03/2013  . MI, old 03/2007   ACUTE ANTEROSEPTAL  . NSTEMI (non-ST elevated myocardial infarction)- type 2 02/28/2013  . OA (osteoarthritis) of knee    Left knee with revision and replacement of prosthetic L TKR  . Peripheral edema   . RBBB (right bundle branch block)   . Renal insufficiency   . Staph infection    states that gentamycin & vancomycin, (30 - 45 days worth) are what did his kidneys in, per his daughter    Past Surgical History:  Procedure Laterality Date  . BACK SURGERY     has had 3 surgeries on his back  . BILATERAL CARPAL TUNNEL RELEASE    . BUNIONECTOMY    . CARDIAC CATHETERIZATION  03/24/2007  . CARDIOVASCULAR STRESS TEST  04/02/2009   EF 27% AND A LARGE AREA OF INFARCT IN THE APICAL ANTERIOR WITH MILD PERI-INFARCT ISCHEMIA  . FRACTURE SURGERY     both ankles  . INSERTION OF DIALYSIS CATHETER  10/30/2016   LAPAROSCOPIC INSERTION CONTINUOUS AMBULATORY PERITONEAL DIALYSIS  (CAPD) CATHETER   OMENTOPEXY  . JOINT REPLACEMENT     3 on right hip, 2 on knee  . KNEE SURGERY  1998   left knee  . neck surgeries (x2)    . TOE AMPUTATION     LEFT FOOT  . TOTAL KNEE ARTHROPLASTY     left knee  . TRANSTHORACIC  ECHOCARDIOGRAM  04/04/2009   EF 45-50%  . TRANSURETHRAL RESECTION OF PROSTATE      Social History   Social History  . Marital status: Married    Spouse name: N/A  . Number of children: N/A  . Years of education: N/A   Occupational History  . Retired [place] Topics  . Smoking status: Never Smoker  . Smokeless tobacco: Never Used  . Alcohol use No  . Drug use: No  . Sexual activity: Not on file   Other Topics Concern  . Not on file   Social History Narrative  . No narrative on file    Family History  Problem Relation Age of Onset  . Coronary artery disease Mother   . Emphysema Father        never smoked, worked as a Psychologist, sport and exercise    Current Facility-Administered Medications  Medication Dose Route Frequency Provider Last Rate Last Dose  . 0.9 %  sodium chloride infusion  250 mL Intravenous PRN Michael Boston, MD      . acetaminophen (TYLENOL) tablet 1,000 mg  1,000 mg Oral Tor Netters, MD   1,000 mg at 10/31/16 0551  . albuterol (PROVENTIL) (2.5 MG/3ML) 0.083% nebulizer solution 2.5 mg  2.5 mg Nebulization Q6H PRN Michael Boston, MD      . allopurinol (ZYLOPRIM) tablet 50 mg  50 mg Oral Daily Michael Boston, MD   50 mg at 10/31/16 1029  . aspirin chewable tablet 81 mg  81 mg Oral Carlis Abbott, MD   81 mg at 10/31/16 1029  . bisacodyl (DULCOLAX) suppository 10 mg  10 mg Rectal Daily PRN Michael Boston, MD      . chlorhexidine (PERIDEX) 0.12 % solution 15 mL  15 mL Mouth Rinse BID Michael Boston, MD   15 mL at 10/31/16 1031  . diphenhydrAMINE (BENADRYL) 12.5 MG/5ML elixir 12.5 mg  12.5 mg Oral Q6H PRN Michael Boston, MD       Or  . diphenhydrAMINE (BENADRYL) injection 12.5 mg  12.5 mg Intravenous Q6H PRN Michael Boston, MD      . enoxaparin (LOVENOX) injection 30 mg  30 mg Subcutaneous Q24H Michael Boston, MD   30 mg at 10/31/16 0854  . fentaNYL (SUBLIMAZE) injection 25-50 mcg  25-50 mcg Intravenous Q1H PRN Michael Boston, MD      . furosemide (LASIX)  tablet 80 mg  80 mg Oral Daily Michael Boston, MD   80 mg at 10/31/16 1030  . hydrALAZINE (APRESOLINE) injection 5-20 mg  5-20 mg Intravenous Q4H PRN Michael Boston, MD      . hydrALAZINE (APRESOLINE) tablet 75 mg  75 mg Oral TID Michael Boston, MD   75 mg at 10/31/16 1029  . isosorbide mononitrate (IMDUR) 24 hr tablet 60 mg  60 mg Oral Daily  Michael Boston, MD   60 mg at 10/31/16 1031  . lactated ringers bolus 1,000 mL  1,000 mL Intravenous Q8H PRN Michael Boston, MD      . lactated ringers infusion 1,000 mL  1,000 mL Intravenous Q8H PRN Marteze Vecchio, Remo Lipps, MD      . lip balm (BLISTEX) ointment 1 application  1 application Topical BID Michael Boston, MD   1 application at 60/45/40 1030  . magic mouthwash  15 mL Oral QID PRN Michael Boston, MD      . MEDLINE mouth rinse  15 mL Mouth Rinse Tana Conch, MD      . methocarbamol (ROBAXIN) tablet 500 mg  500 mg Oral Q6H PRN Michael Boston, MD   500 mg at 10/30/16 2033  . metolazone (ZAROXOLYN) tablet 2.5 mg  2.5 mg Oral Daily PRN Michael Boston, MD      . metoprolol tartrate (LOPRESSOR) tablet 25 mg  25 mg Oral BID Michael Boston, MD   25 mg at 10/31/16 1030  . nitroGLYCERIN (NITROSTAT) SL tablet 0.4 mg  0.4 mg Sublingual Q5 min PRN Michael Boston, MD      . ondansetron (ZOFRAN-ODT) disintegrating tablet 4 mg  4 mg Oral Q6H PRN Michael Boston, MD       Or  . ondansetron (ZOFRAN) injection 4 mg  4 mg Intravenous Q6H PRN Michael Boston, MD      . polyethylene glycol (MIRALAX / GLYCOLAX) packet 17 g  17 g Oral Q12H PRN Michael Boston, MD      . polyvinyl alcohol (LIQUIFILM TEARS) 1.4 % ophthalmic solution 1 drop  1 drop Both Eyes PRN Michael Boston, MD      . simethicone (MYLICON) chewable tablet 40 mg  40 mg Oral Q6H PRN Michael Boston, MD      . sodium chloride flush (NS) 0.9 % injection 3 mL  3 mL Intravenous Gorden Harms, MD   3 mL at 10/31/16 1031  . sodium chloride flush (NS) 0.9 % injection 3 mL  3 mL Intravenous PRN Michael Boston, MD      . traMADol  Veatrice Bourbon) tablet 50-100 mg  50-100 mg Oral Q6H PRN Michael Boston, MD         Allergies  Allergen Reactions  . Gentamycin [Gentamicin] Other (See Comments)    Decreased kidney fx  . Crestor [Rosuvastatin Calcium] Other (See Comments)    MUSCLE PAIN  . Hctz [Hydrochlorothiazide] Other (See Comments)    dizziness  . Lipitor [Atorvastatin Calcium] Other (See Comments)    MUSCLE PAIN    BP 139/71 (BP Location: Left Arm)   Pulse (!) 59   Temp 98.2 F (36.8 C) (Oral)   Resp 16   Ht 5\' 3"  (1.6 m)   Wt 80.7 kg (177 lb 14.4 oz)   SpO2 96%   BMI 31.51 kg/m   No results found.

## 2016-10-31 NOTE — Progress Notes (Signed)
Pt keeps expressing that he does not feel very well,says that he is unable to urinate. Explained to pt that with his kidney failure, he may not be able to urinate hence the need for peritoneal hemodialysis. Pt also said he was having double vision. MD paged and order given for orthostatic vital signs given

## 2016-10-31 NOTE — Progress Notes (Signed)
CRITICAL VALUE ALERT  Critical Value:  Troponin 0.08  Date & Time Notied:  10/31/2016  Provider Notified: Dr Johney Maine  Orders Received/Actions taken: see orders given

## 2016-11-01 ENCOUNTER — Encounter (HOSPITAL_COMMUNITY): Payer: Self-pay | Admitting: Surgery

## 2016-11-01 DIAGNOSIS — I959 Hypotension, unspecified: Secondary | ICD-10-CM | POA: Diagnosis not present

## 2016-11-01 DIAGNOSIS — H532 Diplopia: Secondary | ICD-10-CM | POA: Diagnosis not present

## 2016-11-01 DIAGNOSIS — I255 Ischemic cardiomyopathy: Secondary | ICD-10-CM | POA: Diagnosis not present

## 2016-11-01 DIAGNOSIS — I132 Hypertensive heart and chronic kidney disease with heart failure and with stage 5 chronic kidney disease, or end stage renal disease: Secondary | ICD-10-CM | POA: Diagnosis not present

## 2016-11-01 DIAGNOSIS — M109 Gout, unspecified: Secondary | ICD-10-CM | POA: Diagnosis not present

## 2016-11-01 DIAGNOSIS — N186 End stage renal disease: Secondary | ICD-10-CM | POA: Diagnosis not present

## 2016-11-01 DIAGNOSIS — R001 Bradycardia, unspecified: Secondary | ICD-10-CM | POA: Diagnosis not present

## 2016-11-01 DIAGNOSIS — N185 Chronic kidney disease, stage 5: Secondary | ICD-10-CM | POA: Diagnosis not present

## 2016-11-01 DIAGNOSIS — I5042 Chronic combined systolic (congestive) and diastolic (congestive) heart failure: Secondary | ICD-10-CM

## 2016-11-01 DIAGNOSIS — Z992 Dependence on renal dialysis: Secondary | ICD-10-CM | POA: Diagnosis not present

## 2016-11-01 DIAGNOSIS — J4540 Moderate persistent asthma, uncomplicated: Secondary | ICD-10-CM | POA: Diagnosis not present

## 2016-11-01 DIAGNOSIS — N400 Enlarged prostate without lower urinary tract symptoms: Secondary | ICD-10-CM | POA: Diagnosis not present

## 2016-11-01 DIAGNOSIS — I2510 Atherosclerotic heart disease of native coronary artery without angina pectoris: Secondary | ICD-10-CM | POA: Diagnosis not present

## 2016-11-01 DIAGNOSIS — D631 Anemia in chronic kidney disease: Secondary | ICD-10-CM | POA: Diagnosis not present

## 2016-11-01 LAB — RENAL FUNCTION PANEL
ANION GAP: 16 — AB (ref 5–15)
Albumin: 3.1 g/dL — ABNORMAL LOW (ref 3.5–5.0)
BUN: 113 mg/dL — ABNORMAL HIGH (ref 6–20)
CALCIUM: 9.2 mg/dL (ref 8.9–10.3)
CO2: 29 mmol/L (ref 22–32)
CREATININE: 5.5 mg/dL — AB (ref 0.61–1.24)
Chloride: 91 mmol/L — ABNORMAL LOW (ref 101–111)
GFR, EST AFRICAN AMERICAN: 10 mL/min — AB (ref >60)
GFR, EST NON AFRICAN AMERICAN: 8 mL/min — AB (ref >60)
Glucose, Bld: 94 mg/dL (ref 65–99)
Phosphorus: 10.4 mg/dL — ABNORMAL HIGH (ref 2.5–4.6)
Potassium: 3.4 mmol/L — ABNORMAL LOW (ref 3.5–5.1)
SODIUM: 136 mmol/L (ref 135–145)

## 2016-11-01 MED ORDER — MENTHOL 3 MG MT LOZG
1.0000 | LOZENGE | OROMUCOSAL | Status: DC | PRN
  Administered 2016-11-01: 3 mg via ORAL
  Filled 2016-11-01: qty 9

## 2016-11-01 MED ORDER — PHENOL 1.4 % MT LIQD: 1.0000 | OROMUCOSAL | Status: DC | PRN

## 2016-11-01 MED ORDER — NEPRO/CARBSTEADY PO LIQD
237.0000 mL | Freq: Three times a day (TID) | ORAL | Status: DC
  Administered 2016-11-01 – 2016-11-02 (×2): 237 mL via ORAL
  Filled 2016-11-01 (×7): qty 237

## 2016-11-01 NOTE — Progress Notes (Signed)
[facility] ROUNDING NOTE   Subjective:   End Stage Renal Disease  History of hypertension and Coronary Artery Disease. He has a history of MI and Congestive heart failure with an EF 45 %  Felt weak and dizzy with diplopia after placement of PD catheter 10/11  Unable to go home 10/12 and was kept overnight . Some bradycardia with a bifascicular block noted on EKG and marginal blood pressure Cardiology were consulted and medications stopped  CT head was negative for intracranial bleed 10/12   Objective:  Vital signs in last 24 hours:  Temp:  [98.2 F (36.8 C)-98.4 F (36.9 C)] 98.4 F (36.9 C) (10/12 1334) Pulse Rate:  [45-59] 58 (10/13 0515) Resp:  [18-20] 20 (10/13 0515) BP: (90-139)/(51-71) 126/63 (10/13 0515) SpO2:  [94 %-100 %] 94 % (10/13 0515)  Weight change:  Filed Weights   10/30/16 0821  Weight: 177 lb 14.4 oz (80.7 kg)    Intake/Output: I/O last 3 completed shifts: In: 590 [P.O.:590] Out: 100 [Urine:100]   Intake/Output this shift:  No intake/output data recorded.  CVS- RRR RS- CTA ABD- BS present soft non-distended EXT- no edema   Basic Metabolic Panel:  Recent Labs Lab 10/27/16 1443 10/30/16 0757 10/31/16 1236 11/01/16 0608  NA 134* 136 137 136  K 3.0* 2.5* 3.3* 3.4*  CL 88* 88* 91* 91*  CO2 27 29 29 29   GLUCOSE 86 210* 135* 94  BUN 108* 118* 114* 113*  CREATININE 5.64* 5.64* 5.60* 5.50*  CALCIUM 9.7 9.8 9.3 9.2  PHOS  --   --  10.4* 10.4*    Liver Function Tests:  Recent Labs Lab 10/31/16 1236 11/01/16 0608  ALBUMIN 3.4* 3.1*   No results for input(s): LIPASE, AMYLASE in the last 168 hours. No results for input(s): AMMONIA in the last 168 hours.  CBC:  Recent Labs Lab 10/27/16 1443 10/31/16 1236  WBC 9.4 7.2  HGB 12.1* 11.6*  HCT 36.1* 35.6*  MCV 92.1 93.4  PLT 305 241    Cardiac Enzymes:  Recent Labs Lab 10/31/16 1236 10/31/16 1816  TROPONINI 0.08* 0.07*    BNP: Invalid input(s):  POCBNP  CBG: No results for input(s): GLUCAP in the last 168 hours.  Microbiology: Results for orders placed or performed during the hospital encounter of 03/02/13  Culture, blood (routine x 2)     Status: None   Collection Time: 03/03/13  9:55 AM  Result Value Ref Range Status   Specimen Description BLOOD RIGHT ARM  Final   Special Requests BOTTLES DRAWN AEROBIC ONLY  [facility]  Final   Culture  Setup Time   Final    03/03/2013 14:36 Performed at [facility]   Final    NO GROWTH 5 DAYS Performed at [facility]   Report Status 03/09/2013 FINAL  Final  Culture, blood (routine x 2)     Status: None   Collection Time: 03/03/13 10:05 AM  Result Value Ref Range Status   Specimen Description BLOOD RIGHT FOREARM  Final   Special Requests BOTTLES DRAWN AEROBIC ONLY [facility]  Final   Culture  Setup Time   Final    03/03/2013 14:36 Performed at [facility]   Culture   Final    NO GROWTH 5 DAYS Performed at [facility]   Report Status 03/09/2013 FINAL  Final    Coagulation Studies: No results for input(s): LABPROT, INR in the last 72 hours.  Urinalysis: No results for input(s): COLORURINE, LABSPEC,  PHURINE, GLUCOSEU, HGBUR, BILIRUBINUR, KETONESUR, PROTEINUR, UROBILINOGEN, NITRITE, LEUKOCYTESUR in the last 72 hours.  Invalid input(s): APPERANCEUR    Imaging: Dg Chest 2 View  Result Date: 10/31/2016 CLINICAL DATA:  Shortness of breath on exertion. Chest tightness. Insertion of peritoneal dialysis catheter yesterday. EXAM: CHEST  2 VIEW COMPARISON:  08/27/2015 FINDINGS: Surgical changes in the lower cervical spine and in the thoracolumbar spine. The lungs are clear. Heart size is upper limits of normal but stable. Extensive degenerative disease in both shoulders. Atherosclerotic calcifications at the aortic arch. No large pleural effusions. Small amount of lucency underneath the right hemidiaphragm. Findings compatible with intraperitoneal air from  recent catheter placement. IMPRESSION: No active cardiopulmonary disease. Small amount of free intraperitoneal air. Findings compatible with recent peritoneal catheter insertion. Electronically Signed   By: Markus Daft M.D.   On: 10/31/2016 13:12   Ct Head Wo Contrast  Result Date: 10/31/2016 CLINICAL DATA:  Diplopia EXAM: CT HEAD WITHOUT CONTRAST TECHNIQUE: Contiguous axial images were obtained from the base of the skull through the vertex without intravenous contrast. COMPARISON:  None. FINDINGS: Brain: No mass lesion, intraparenchymal hemorrhage or extra-axial collection. No evidence of acute cortical infarct. There is periventricular hypoattenuation compatible with chronic microvascular disease. Vascular: No hyperdense vessel or unexpected calcification. Skull: Suspected iatrogenic graft material in the expected location of the right temporalis muscle. No skull fracture. Sinuses/Orbits: No sinus fluid levels or advanced mucosal thickening. No mastoid effusion. Normal orbits. IMPRESSION: No acute intracranial abnormality. Findings of chronic microvascular disease. Electronically Signed   By: Ulyses Jarred M.D.   On: 10/31/2016 19:15     Medications:   . sodium chloride    . lactated ringers    . lactated ringers     . acetaminophen  1,000 mg Oral Q8H  . allopurinol  50 mg Oral Daily  . aspirin  81 mg Oral QODAY  . chlorhexidine  15 mL Mouth Rinse BID  . enoxaparin (LOVENOX) injection  30 mg Subcutaneous Q24H  . hydrALAZINE  25 mg Oral Q8H  . isosorbide mononitrate  60 mg Oral Daily  . lip balm  1 application Topical BID  . mouth rinse  15 mL Mouth Rinse q12n4p  . metoprolol succinate  12.5 mg Oral Daily  . sodium chloride flush  3 mL Intravenous Q12H   sodium chloride, albuterol, bisacodyl, diphenhydrAMINE **OR** diphenhydrAMINE, fentaNYL (SUBLIMAZE) injection, lactated ringers, lactated ringers, magic mouthwash, methocarbamol, nitroGLYCERIN, ondansetron **OR** ondansetron (ZOFRAN) IV,  polyethylene glycol, polyvinyl alcohol, simethicone, sodium chloride flush, traMADol  Assessment/ Plan:  End  Stage Renal Disease  History of hypertension and Coronary Artery Disease. He has a history of MI and Congestive heart failure with an EF 45 % No complaints of shortness of breath or chest pain  No orthopnea and no ankle or leg swelling Does have some abdominal distention and bloated feeling post operatively Has concerns about going back home with the power outages   1. ESRD   Appreciate Dr Johney Maine  Placed PD catheter in patient 10/11   2. Hypokalemia  Will replete today  2. Volume  No appreciable volume overload 3. Anemia stable 4. Bones stable at this time 5. Weakness  Bifascicular block on EKG  Bradycardic  I would recommend stopping metoprolol  6. Hypotensive would hold antihypertensives   Cardiology following   LOS: 0 Layna Roeper W @TODAY @9 :31 AM

## 2016-11-01 NOTE — Progress Notes (Signed)
[facility] Progress Note  2 Days Post-Op  Subjective: CC: feels weak Denies chest pain. States he feels like dizziness and weakness may have improved slightly this AM. Abdomen is sore, no flatus. Not much appetite, but wife states this is chronic.  Objective: Vital signs in last 24 hours: Temp:  [98.2 F (36.8 C)-98.4 F (36.9 C)] 98.4 F (36.9 C) (10/12 1334) Pulse Rate:  [45-59] 58 (10/13 0515) Resp:  [18-20] 20 (10/13 0515) BP: (90-139)/(51-71) 126/63 (10/13 0515) SpO2:  [94 %-100 %] 94 % (10/13 0515)    Intake/Output from previous day: 10/12 0701 - 10/13 0700 In: 540 [P.O.:540] Out: 100 [Urine:100] Intake/Output this shift: No intake/output data recorded.  PE: Gen:  Alert, NAD, cooperative Card:  Regular rate and rhythm Pulm:  Normal effort, clear to auscultation bilaterally Abd: Soft, appropriately TTP, distended, bowel sounds present in all 4 quadrants, no HSM, dressings C/D/I Skin: warm and dry, no rashes  Psych: A&Ox3   Lab Results:   Recent Labs  10/31/16 1236  WBC 7.2  HGB 11.6*  HCT 35.6*  PLT 241   BMET  Recent Labs  10/31/16 1236 11/01/16 0608  NA 137 136  K 3.3* 3.4*  CL 91* 91*  CO2 29 29  GLUCOSE 135* 94  BUN 114* 113*  CREATININE 5.60* 5.50*  CALCIUM 9.3 9.2   PT/INR No results for input(s): LABPROT, INR in the last 72 hours. CMP     Component Value Date/Time   NA 136 11/01/2016 0608   K 3.4 (L) 11/01/2016 0608   CL 91 (L) 11/01/2016 0608   CO2 29 11/01/2016 0608   GLUCOSE 94 11/01/2016 0608   BUN 113 (H) 11/01/2016 0608   CREATININE 5.50 (H) 11/01/2016 0608   CREATININE 4.26 (H) 05/14/2016 1120   CALCIUM 9.2 11/01/2016 0608   PROT 6.6 08/31/2013 0944   ALBUMIN 3.1 (L) 11/01/2016 0608   AST 19 08/31/2013 0944   ALT 16 08/31/2013 0944   ALKPHOS 109 08/31/2013 0944   BILITOT 0.7 08/31/2013 0944   GFRNONAA 8 (L) 11/01/2016 0608   GFRAA 10 (L) 11/01/2016 0608   Lipase  No results found for:  LIPASE     Studies/Results: Dg Chest 2 View  Result Date: 10/31/2016 CLINICAL DATA:  Shortness of breath on exertion. Chest tightness. Insertion of peritoneal dialysis catheter yesterday. EXAM: CHEST  2 VIEW COMPARISON:  08/27/2015 FINDINGS: Surgical changes in the lower cervical spine and in the thoracolumbar spine. The lungs are clear. Heart size is upper limits of normal but stable. Extensive degenerative disease in both shoulders. Atherosclerotic calcifications at the aortic arch. No large pleural effusions. Small amount of lucency underneath the right hemidiaphragm. Findings compatible with intraperitoneal air from recent catheter placement. IMPRESSION: No active cardiopulmonary disease. Small amount of free intraperitoneal air. Findings compatible with recent peritoneal catheter insertion. Electronically Signed   By: Markus Daft M.D.   On: 10/31/2016 13:12   Ct Head Wo Contrast  Result Date: 10/31/2016 CLINICAL DATA:  Diplopia EXAM: CT HEAD WITHOUT CONTRAST TECHNIQUE: Contiguous axial images were obtained from the base of the skull through the vertex without intravenous contrast. COMPARISON:  None. FINDINGS: Brain: No mass lesion, intraparenchymal hemorrhage or extra-axial collection. No evidence of acute cortical infarct. There is periventricular hypoattenuation compatible with chronic microvascular disease. Vascular: No hyperdense vessel or unexpected calcification. Skull: Suspected iatrogenic graft material in the expected location of the right temporalis muscle. No skull fracture. Sinuses/Orbits: No sinus fluid levels or advanced mucosal thickening. No  mastoid effusion. Normal orbits. IMPRESSION: No acute intracranial abnormality. Findings of chronic microvascular disease. Electronically Signed   By: Ulyses Jarred M.D.   On: 10/31/2016 19:15    Anti-infectives: Anti-infectives    Start     Dose/Rate Route Frequency Ordered Stop   10/30/16 0704  ceFAZolin (ANCEF) IVPB 2g/100 mL premix      2 g 200 mL/hr over 30 Minutes Intravenous On call to O.R. 10/30/16 0704 10/30/16 1107       Assessment/Plan ESRD S/P PD catheter insertion 10/11 Dr. Johney Maine - Patient with mild distention but good BS - encourage ambulation and OOB - will order renal ensure shakes - chloraseptic/cepacol for sore throat Hypokalemia Anemia stable  Hx of CAD with slight troponin elevation - Cards consulted yesterday, appreciate input - decreased metoprolol and holding anti-HTN meds - troponin trending down - EKG with bifasicular block and bradycardia Combined Chronic systolic and diastolic CHF Dizziness and Blurred vision - head CT negative for acute findings  FEN: renal diet and IVF per nephrology; patient requesting renal ensure VTE: SCDs, lovenox ID: Ancef periop 10/11  Plan: continue to monitor and try to mobilize patient. Symptomatic tx for sore throat. Hopefully home tomorrow.     LOS: 0 days    Brigid Re , [facility] 11/01/2016, 10:11 AM Pager: [phone] Consults: [phone] Mon-Fri 7:00 am-4:30 pm Sat-Sun 7:00 am-11:30 am

## 2016-11-01 NOTE — Progress Notes (Signed)
DAILY PROGRESS NOTE   Patient Name: [name] Date of Encounter: 11/01/2016  Hospital Problem List   Principal Problem:   CKD (chronic kidney disease) stage 5, needing CAPD Dialysis Active Problems:   Chronic combined systolic and diastolic CHF (congestive heart failure) (HCC)   Cardiomyopathy, ischemic- EF 45% 2D 02/27/13   Moderate persistent asthma in adult without complication   Bradycardia   Diplopia   Dizziness   Hypotension due to drugs    Chief Complaint   Less dizzy today  Subjective   HR improved overnight- now in 70's. Occasional PVC's. On low dose Toprol XL. Hydralazine dose decreased. CT negative for acute bleed or stroke. BP normal today.   Objective   Vitals:   10/31/16 1814 10/31/16 2140 11/01/16 0515 11/01/16 1043  BP: (!) 109/56 126/68 126/63   Pulse: (!) 46 (!) 51 (!) 58 61  Resp:  20 20   Temp:      TempSrc:  Oral    SpO2: 96% 97% 94%   Weight:      Height:        Intake/Output Summary (Last 24 hours) at 11/01/16 1134 Last data filed at 11/01/16 0515  Gross per 24 hour  Intake              420 ml  Output                0 ml  Net              420 ml   Filed Weights   10/30/16 0821  Weight: 177 lb 14.4 oz (80.7 kg)    Physical Exam   General appearance: alert, appears stated age and no distress Lungs: clear to auscultation bilaterally Heart: regular rate and rhythm Extremities: extremities normal, atraumatic, no cyanosis or edema Neurologic: Grossly normal  Inpatient Medications    Scheduled Meds: . acetaminophen  1,000 mg Oral Q8H  . allopurinol  50 mg Oral Daily  . aspirin  81 mg Oral QODAY  . enoxaparin (LOVENOX) injection  30 mg Subcutaneous Q24H  . feeding supplement (NEPRO CARB STEADY)  237 mL Oral TID WC  . lip balm  1 application Topical BID  . metoprolol succinate  12.5 mg Oral Daily  . sodium chloride flush  3 mL Intravenous Q12H    Continuous Infusions: . sodium chloride    . lactated ringers    .  lactated ringers      PRN Meds: sodium chloride, albuterol, bisacodyl, diphenhydrAMINE **OR** diphenhydrAMINE, fentaNYL (SUBLIMAZE) injection, lactated ringers, lactated ringers, magic mouthwash, menthol-cetylpyridinium, methocarbamol, nitroGLYCERIN, ondansetron **OR** ondansetron (ZOFRAN) IV, phenol, polyethylene glycol, polyvinyl alcohol, simethicone, sodium chloride flush, traMADol   Labs   Results for orders placed or performed during the hospital encounter of 10/30/16 (from the past 48 hour(s))  Renal function panel     Status: Abnormal   Collection Time: 10/31/16 12:36 PM  Result Value Ref Range   Sodium 137 135 - 145 mmol/L   Potassium 3.3 (L) 3.5 - 5.1 mmol/L   Chloride 91 (L) 101 - 111 mmol/L   CO2 29 22 - 32 mmol/L   Glucose, Bld 135 (H) 65 - 99 mg/dL   BUN 114 (H) 6 - 20 mg/dL   Creatinine, Ser 5.60 (H) 0.61 - 1.24 mg/dL   Calcium 9.3 8.9 - 10.3 mg/dL   Phosphorus 10.4 (H) 2.5 - 4.6 mg/dL   Albumin 3.4 (L) 3.5 - 5.0 g/dL   GFR calc non Af Wyvonnia Lora  8 (L) >60 mL/min   GFR calc Af Amer 10 (L) >60 mL/min    Comment: (NOTE) The eGFR has been calculated using the CKD EPI equation. This calculation has not been validated in all clinical situations. eGFR's persistently <60 mL/min signify possible Chronic Kidney Disease.    Anion gap 17 (H) 5 - 15  CBC     Status: Abnormal   Collection Time: 10/31/16 12:36 PM  Result Value Ref Range   WBC 7.2 4.0 - 10.5 K/uL   RBC 3.81 (L) 4.22 - 5.81 MIL/uL   Hemoglobin 11.6 (L) 13.0 - 17.0 g/dL   HCT 35.6 (L) 39.0 - 52.0 %   MCV 93.4 78.0 - 100.0 fL   MCH 30.4 26.0 - 34.0 pg   MCHC 32.6 30.0 - 36.0 g/dL   RDW 14.5 11.5 - 15.5 %   Platelets 241 150 - 400 K/uL  Troponin I     Status: Abnormal   Collection Time: 10/31/16 12:36 PM  Result Value Ref Range   Troponin I 0.08 (HH) <0.03 ng/mL    Comment: CRITICAL RESULT CALLED TO, READ BACK BY AND VERIFIED WITH: B.[email or web address],RN 1410 10/31/16 CLARK,S   Troponin I     Status: Abnormal   Collection  Time: 10/31/16  6:16 PM  Result Value Ref Range   Troponin I 0.07 (HH) <0.03 ng/mL    Comment: CRITICAL VALUE NOTED.  VALUE IS CONSISTENT WITH PREVIOUSLY REPORTED AND CALLED VALUE.  Renal function panel     Status: Abnormal   Collection Time: 11/01/16  6:08 AM  Result Value Ref Range   Sodium 136 135 - 145 mmol/L   Potassium 3.4 (L) 3.5 - 5.1 mmol/L   Chloride 91 (L) 101 - 111 mmol/L   CO2 29 22 - 32 mmol/L   Glucose, Bld 94 65 - 99 mg/dL   BUN 113 (H) 6 - 20 mg/dL   Creatinine, Ser 5.50 (H) 0.61 - 1.24 mg/dL   Calcium 9.2 8.9 - 10.3 mg/dL   Phosphorus 10.4 (H) 2.5 - 4.6 mg/dL   Albumin 3.1 (L) 3.5 - 5.0 g/dL   GFR calc non Af Amer 8 (L) >60 mL/min   GFR calc Af Amer 10 (L) >60 mL/min    Comment: (NOTE) The eGFR has been calculated using the CKD EPI equation. This calculation has not been validated in all clinical situations. eGFR's persistently <60 mL/min signify possible Chronic Kidney Disease.    Anion gap 16 (H) 5 - 15    ECG   N/A  Telemetry   NSR with occasional PVC's overnight - Personally Reviewed  Radiology    Dg Chest 2 View  Result Date: 10/31/2016 CLINICAL DATA:  Shortness of breath on exertion. Chest tightness. Insertion of peritoneal dialysis catheter yesterday. EXAM: CHEST  2 VIEW COMPARISON:  08/27/2015 FINDINGS: Surgical changes in the lower cervical spine and in the thoracolumbar spine. The lungs are clear. Heart size is upper limits of normal but stable. Extensive degenerative disease in both shoulders. Atherosclerotic calcifications at the aortic arch. No large pleural effusions. Small amount of lucency underneath the right hemidiaphragm. Findings compatible with intraperitoneal air from recent catheter placement. IMPRESSION: No active cardiopulmonary disease. Small amount of free intraperitoneal air. Findings compatible with recent peritoneal catheter insertion. Electronically Signed   By: Markus Daft M.D.   On: 10/31/2016 13:12   Ct Head Wo  Contrast  Result Date: 10/31/2016 CLINICAL DATA:  Diplopia EXAM: CT HEAD WITHOUT CONTRAST TECHNIQUE: Contiguous axial images were obtained from the  base of the skull through the vertex without intravenous contrast. COMPARISON:  None. FINDINGS: Brain: No mass lesion, intraparenchymal hemorrhage or extra-axial collection. No evidence of acute cortical infarct. There is periventricular hypoattenuation compatible with chronic microvascular disease. Vascular: No hyperdense vessel or unexpected calcification. Skull: Suspected iatrogenic graft material in the expected location of the right temporalis muscle. No skull fracture. Sinuses/Orbits: No sinus fluid levels or advanced mucosal thickening. No mastoid effusion. Normal orbits. IMPRESSION: No acute intracranial abnormality. Findings of chronic microvascular disease. Electronically Signed   By: Ulyses Jarred M.D.   On: 10/31/2016 19:15    Cardiac Studies   N/A  Assessment   1. Principal Problem: 2.   CKD (chronic kidney disease) stage 5, needing CAPD Dialysis 3. Active Problems: 4.   Chronic combined systolic and diastolic CHF (congestive heart failure) (HCC) 5.   Cardiomyopathy, ischemic- EF 45% 2D 02/27/13 6.   Moderate persistent asthma in adult without complication 7.   Bradycardia 8.   Diplopia 9.   Dizziness 10.   Hypotension due to drugs 11.   Plan   1. Bradycardia and hypotension improved overnight with medication changes. Less dizzy today. Diplopia has resolved. Recommend restarting plavix 75 mg daily when safe from a surgical bleeding perspective.   No further suggestions. Follow-up with Dr. Martinique after discharge. Cardiology will sign-off.  Time Spent Directly with Patient:  I have spent a total of 15 minutes with the patient reviewing hospital notes, telemetry, EKGs, labs and examining the patient as well as establishing an assessment and plan that was discussed personally with the patient. > 50% of time was spent in direct  patient care.  Length of Stay:  LOS: 0 days   Pixie Casino, MD, [facility]  Attending Cardiologist  Direct Dial: [phone]  Fax: [phone]  Website:  [email or web address] 11/01/2016, 11:34 AM

## 2016-11-02 DIAGNOSIS — J4540 Moderate persistent asthma, uncomplicated: Secondary | ICD-10-CM | POA: Diagnosis not present

## 2016-11-02 DIAGNOSIS — H532 Diplopia: Secondary | ICD-10-CM | POA: Diagnosis not present

## 2016-11-02 DIAGNOSIS — N186 End stage renal disease: Secondary | ICD-10-CM | POA: Diagnosis not present

## 2016-11-02 DIAGNOSIS — M109 Gout, unspecified: Secondary | ICD-10-CM | POA: Diagnosis not present

## 2016-11-02 DIAGNOSIS — I132 Hypertensive heart and chronic kidney disease with heart failure and with stage 5 chronic kidney disease, or end stage renal disease: Secondary | ICD-10-CM | POA: Diagnosis not present

## 2016-11-02 DIAGNOSIS — I959 Hypotension, unspecified: Secondary | ICD-10-CM | POA: Diagnosis not present

## 2016-11-02 DIAGNOSIS — I5042 Chronic combined systolic (congestive) and diastolic (congestive) heart failure: Secondary | ICD-10-CM | POA: Diagnosis not present

## 2016-11-02 DIAGNOSIS — I2510 Atherosclerotic heart disease of native coronary artery without angina pectoris: Secondary | ICD-10-CM | POA: Diagnosis not present

## 2016-11-02 DIAGNOSIS — D631 Anemia in chronic kidney disease: Secondary | ICD-10-CM | POA: Diagnosis not present

## 2016-11-02 DIAGNOSIS — I255 Ischemic cardiomyopathy: Secondary | ICD-10-CM | POA: Diagnosis not present

## 2016-11-02 DIAGNOSIS — N400 Enlarged prostate without lower urinary tract symptoms: Secondary | ICD-10-CM | POA: Diagnosis not present

## 2016-11-02 DIAGNOSIS — Z992 Dependence on renal dialysis: Secondary | ICD-10-CM | POA: Diagnosis not present

## 2016-11-02 LAB — RENAL FUNCTION PANEL
ALBUMIN: 3 g/dL — AB (ref 3.5–5.0)
ANION GAP: 15 (ref 5–15)
BUN: 118 mg/dL — ABNORMAL HIGH (ref 6–20)
CHLORIDE: 91 mmol/L — AB (ref 101–111)
CO2: 27 mmol/L (ref 22–32)
Calcium: 9.3 mg/dL (ref 8.9–10.3)
Creatinine, Ser: 5.78 mg/dL — ABNORMAL HIGH (ref 0.61–1.24)
GFR calc Af Amer: 9 mL/min — ABNORMAL LOW (ref >60)
GFR calc non Af Amer: 8 mL/min — ABNORMAL LOW (ref >60)
GLUCOSE: 100 mg/dL — AB (ref 65–99)
PHOSPHORUS: 9 mg/dL — AB (ref 2.5–4.6)
POTASSIUM: 3.5 mmol/L (ref 3.5–5.1)
Sodium: 133 mmol/L — ABNORMAL LOW (ref 135–145)

## 2016-11-02 LAB — CBC
HEMATOCRIT: 33.6 % — AB (ref 39.0–52.0)
HEMOGLOBIN: 11.2 g/dL — AB (ref 13.0–17.0)
MCH: 30.9 pg (ref 26.0–34.0)
MCHC: 33.3 g/dL (ref 30.0–36.0)
MCV: 92.8 fL (ref 78.0–100.0)
Platelets: 227 10*3/uL (ref 150–400)
RBC: 3.62 MIL/uL — ABNORMAL LOW (ref 4.22–5.81)
RDW: 14.6 % (ref 11.5–15.5)
WBC: 7.8 10*3/uL (ref 4.0–10.5)

## 2016-11-02 MED ORDER — METOPROLOL TARTRATE 25 MG PO TABS: 12.5000 mg | ORAL_TABLET | Freq: Two times a day (BID) | ORAL | Status: DC

## 2016-11-02 NOTE — Progress Notes (Signed)
[facility] Progress Note  3 Days Post-Op  Subjective: CC: feels tired Patient feeling tired this AM. Dizziness and diplopia resolved. Abdominal soreness improving. Passing flatus. Eating small amounts. VSS.   Objective: Vital signs in last 24 hours: Temp:  [97.5 F (36.4 C)] 97.5 F (36.4 C) (10/14 0457) Pulse Rate:  [61-79] 79 (10/14 0457) Resp:  [20] 20 (10/14 0457) BP: (117-134)/(65-77) 134/77 (10/14 0457) SpO2:  [93 %] 93 % (10/14 0457) Last BM Date: 10/29/16  Intake/Output from previous day: 10/13 0701 - 10/14 0700 In: 120 [P.O.:120] Out: 300 [Urine:300] Intake/Output this shift: No intake/output data recorded.  PE: Gen:  Alert, NAD, cooperative Card:  Regular rate and rhythm Pulm:  Normal effort, clear to auscultation bilaterally Abd: Soft, appropriately TTP, distended, bowel sounds present in all 4 quadrants, no HSM, dressings C/D/I Skin: warm and dry, no rashes  Psych: A&Ox3   Lab Results:   Recent Labs  10/31/16 1236 11/02/16 0446  WBC 7.2 7.8  HGB 11.6* 11.2*  HCT 35.6* 33.6*  PLT 241 227   BMET  Recent Labs  11/01/16 0608 11/02/16 0446  NA 136 133*  K 3.4* 3.5  CL 91* 91*  CO2 29 27  GLUCOSE 94 100*  BUN 113* 118*  CREATININE 5.50* 5.78*  CALCIUM 9.2 9.3   PT/INR No results for input(s): LABPROT, INR in the last 72 hours. CMP     Component Value Date/Time   NA 133 (L) 11/02/2016 0446   K 3.5 11/02/2016 0446   CL 91 (L) 11/02/2016 0446   CO2 27 11/02/2016 0446   GLUCOSE 100 (H) 11/02/2016 0446   BUN 118 (H) 11/02/2016 0446   CREATININE 5.78 (H) 11/02/2016 0446   CREATININE 4.26 (H) 05/14/2016 1120   CALCIUM 9.3 11/02/2016 0446   PROT 6.6 08/31/2013 0944   ALBUMIN 3.0 (L) 11/02/2016 0446   AST 19 08/31/2013 0944   ALT 16 08/31/2013 0944   ALKPHOS 109 08/31/2013 0944   BILITOT 0.7 08/31/2013 0944   GFRNONAA 8 (L) 11/02/2016 0446   GFRAA 9 (L) 11/02/2016 0446   Lipase  No results found for:  LIPASE     Studies/Results: Dg Chest 2 View  Result Date: 10/31/2016 CLINICAL DATA:  Shortness of breath on exertion. Chest tightness. Insertion of peritoneal dialysis catheter yesterday. EXAM: CHEST  2 VIEW COMPARISON:  08/27/2015 FINDINGS: Surgical changes in the lower cervical spine and in the thoracolumbar spine. The lungs are clear. Heart size is upper limits of normal but stable. Extensive degenerative disease in both shoulders. Atherosclerotic calcifications at the aortic arch. No large pleural effusions. Small amount of lucency underneath the right hemidiaphragm. Findings compatible with intraperitoneal air from recent catheter placement. IMPRESSION: No active cardiopulmonary disease. Small amount of free intraperitoneal air. Findings compatible with recent peritoneal catheter insertion. Electronically Signed   By: Markus Daft M.D.   On: 10/31/2016 13:12   Ct Head Wo Contrast  Result Date: 10/31/2016 CLINICAL DATA:  Diplopia EXAM: CT HEAD WITHOUT CONTRAST TECHNIQUE: Contiguous axial images were obtained from the base of the skull through the vertex without intravenous contrast. COMPARISON:  None. FINDINGS: Brain: No mass lesion, intraparenchymal hemorrhage or extra-axial collection. No evidence of acute cortical infarct. There is periventricular hypoattenuation compatible with chronic microvascular disease. Vascular: No hyperdense vessel or unexpected calcification. Skull: Suspected iatrogenic graft material in the expected location of the right temporalis muscle. No skull fracture. Sinuses/Orbits: No sinus fluid levels or advanced mucosal thickening. No mastoid effusion. Normal orbits. IMPRESSION: No  acute intracranial abnormality. Findings of chronic microvascular disease. Electronically Signed   By: Ulyses Jarred M.D.   On: 10/31/2016 19:15    Anti-infectives: Anti-infectives    Start     Dose/Rate Route Frequency Ordered Stop   10/30/16 0704  ceFAZolin (ANCEF) IVPB 2g/100 mL premix      2 g 200 mL/hr over 30 Minutes Intravenous On call to O.R. 10/30/16 0704 10/30/16 1107       Assessment/Plan ESRD S/P PD catheter insertion 10/11 Dr. Johney Maine - Patient with mild distention but good BS - encourage ambulation and OOB - will order renal ensure shakes - chloraseptic/cepacol for sore throat Hypokalemia - replace per nephrology Anemia stable  Hx of CAD with slight troponin elevation - Cards consulted yesterday, appreciate input - decreased metoprolol and holding anti-HTN meds - troponin trending down - EKG with bifasicular block and bradycardia Combined Chronic systolic and diastolic CHF Dizziness and Blurred vision - head CT negative for acute findings  FEN: renal diet and IVF per nephrology; patient requesting renal ensure VTE: SCDs, lovenox ID: Ancef periop 10/11  Plan: Discharge home  LOS: 0 days    Brigid Re , [facility] Surgery 11/02/2016, 9:30 AM Pager: [phone] Consults: [phone] Mon-Fri 7:00 am-4:30 pm Sat-Sun 7:00 am-11:30 am

## 2016-11-02 NOTE — Progress Notes (Signed)
[facility] R[facility]ING NOTE   Subjective:   End Stage Renal Disease History of hypertension and Coronary Artery Disease. He has a history of MI and Congestive heart failure with an EF 45 %  Felt weak and dizzy with diplopia after placement of PD catheter 10/11  Unable to go home 10/12 and was kept overnight . Some bradycardia with a bifascicular block noted on EKG and marginal blood pressure Cardiology were consulted and medications stopped  CT head was negative for intracranial bleed 10/12   Heart rate and blood pressure significantly improved with the changes to blood pressure medications. Appreciate cardiology   Would restart lasix 80mg  daily and would have patient make appointment with Home Training Coordinator in AM for appointment and assessment of fluid status, blood pressure and pulse. Will need close follow up this coming week with Nephrology Team in [place]    Objective:  Vital signs in last 24 hours:  Temp:  [97.5 F (36.4 C)] 97.5 F (36.4 C) (10/14 0457) Pulse Rate:  [61-79] 79 (10/14 0457) Resp:  [20] 20 (10/14 0457) BP: (117-134)/(65-77) 134/77 (10/14 0457) SpO2:  [93 %] 93 % (10/14 0457)  Weight change:  Filed Weights   10/30/16 0821  Weight: 177 lb 14.4 oz (80.7 kg)    Intake/Output: I/O last 3 completed shifts: In: 540 [P.O.:540] Out: 300 [Urine:300]   Intake/Output this shift:  No intake/output data recorded.  CVS- RRR RS- CTA ABD- BS present soft non-distended EXT- no edema   Basic Metabolic Panel:  Recent Labs Lab 10/27/16 1443 10/30/16 0757 10/31/16 1236 11/01/16 0608 11/02/16 0446  NA 134* 136 137 136 133*  K 3.0* 2.5* 3.3* 3.4* 3.5  CL 88* 88* 91* 91* 91*  CO2 27 29 29 29 27   GLUCOSE 86 210* 135* 94 100*  BUN 108* 118* 114* 113* 118*  CREATININE 5.64* 5.64* 5.60* 5.50* 5.78*  CALCIUM 9.7 9.8 9.3 9.2 9.3  PHOS  --   --  10.4* 10.4* 9.0*    Liver Function Tests:  Recent Labs Lab 10/31/16 1236 11/01/16 0608  11/02/16 0446  ALBUMIN 3.4* 3.1* 3.0*   No results for input(s): LIPASE, AMYLASE in the last 168 hours. No results for input(s): AMMONIA in the last 168 hours.  CBC:  Recent Labs Lab 10/27/16 1443 10/31/16 1236 11/02/16 0446  WBC 9.4 7.2 7.8  HGB 12.1* 11.6* 11.2*  HCT 36.1* 35.6* 33.6*  MCV 92.1 93.4 92.8  PLT 305 241 227    Cardiac Enzymes:  Recent Labs Lab 10/31/16 1236 10/31/16 1816  TROPONINI 0.08* 0.07*    BNP: Invalid input(s): POCBNP  CBG: No results for input(s): GLUCAP in the last 168 hours.  Microbiology: Results for orders placed or performed during the hospital encounter of 03/02/13  Culture, blood (routine x 2)     Status: None   Collection Time: 03/03/13  9:55 AM  Result Value Ref Range Status   Specimen Description BLOOD RIGHT ARM  Final   Special Requests BOTTLES DRAWN AEROBIC ONLY [facility]  Final   Culture  Setup Time   Final    03/03/2013 14:36 Performed at [facility]   Final    NO GROWTH 5 DAYS Performed at [facility]   Report Status 03/09/2013 FINAL  Final  Culture, blood (routine x 2)     Status: None   Collection Time: 03/03/13 10:05 AM  Result Value Ref Range Status   Specimen Description BLOOD RIGHT FOREARM  Final  Special Requests BOTTLES DRAWN AEROBIC ONLY [facility]  Final   Culture  Setup Time   Final    03/03/2013 14:36 Performed at [facility]   Culture   Final    NO GROWTH 5 DAYS Performed at [facility]   Report Status 03/09/2013 FINAL  Final    Coagulation Studies: No results for input(s): LABPROT, INR in the last 72 hours.  Urinalysis: No results for input(s): COLORURINE, LABSPEC, PHURINE, GLUCOSEU, HGBUR, BILIRUBINUR, KETONESUR, PROTEINUR, UROBILINOGEN, NITRITE, LEUKOCYTESUR in the last 72 hours.  Invalid input(s): APPERANCEUR    Imaging: Dg Chest 2 View  Result Date: 10/31/2016 CLINICAL DATA:  Shortness of breath on exertion. Chest tightness. Insertion of peritoneal  dialysis catheter yesterday. EXAM: CHEST  2 VIEW COMPARISON:  08/27/2015 FINDINGS: Surgical changes in the lower cervical spine and in the thoracolumbar spine. The lungs are clear. Heart size is upper limits of normal but stable. Extensive degenerative disease in both shoulders. Atherosclerotic calcifications at the aortic arch. No large pleural effusions. Small amount of lucency underneath the right hemidiaphragm. Findings compatible with intraperitoneal air from recent catheter placement. IMPRESSION: No active cardiopulmonary disease. Small amount of free intraperitoneal air. Findings compatible with recent peritoneal catheter insertion. Electronically Signed   By: Markus Daft M.D.   On: 10/31/2016 13:12   Ct Head Wo Contrast  Result Date: 10/31/2016 CLINICAL DATA:  Diplopia EXAM: CT HEAD WITHOUT CONTRAST TECHNIQUE: Contiguous axial images were obtained from the base of the skull through the vertex without intravenous contrast. COMPARISON:  None. FINDINGS: Brain: No mass lesion, intraparenchymal hemorrhage or extra-axial collection. No evidence of acute cortical infarct. There is periventricular hypoattenuation compatible with chronic microvascular disease. Vascular: No hyperdense vessel or unexpected calcification. Skull: Suspected iatrogenic graft material in the expected location of the right temporalis muscle. No skull fracture. Sinuses/Orbits: No sinus fluid levels or advanced mucosal thickening. No mastoid effusion. Normal orbits. IMPRESSION: No acute intracranial abnormality. Findings of chronic microvascular disease. Electronically Signed   By: Ulyses Jarred M.D.   On: 10/31/2016 19:15     Medications:   . sodium chloride     . acetaminophen  1,000 mg Oral Q8H  . allopurinol  50 mg Oral Daily  . aspirin  81 mg Oral QODAY  . enoxaparin (LOVENOX) injection  30 mg Subcutaneous Q24H  . feeding supplement (NEPRO CARB STEADY)  237 mL Oral TID WC  . lip balm  1 application Topical BID  .  metoprolol succinate  12.5 mg Oral Daily  . sodium chloride flush  3 mL Intravenous Q12H   sodium chloride, albuterol, bisacodyl, diphenhydrAMINE **OR** diphenhydrAMINE, fentaNYL (SUBLIMAZE) injection, magic mouthwash, menthol-cetylpyridinium, methocarbamol, nitroGLYCERIN, ondansetron **OR** ondansetron (ZOFRAN) IV, phenol, polyethylene glycol, polyvinyl alcohol, simethicone, sodium chloride flush, traMADol  Assessment/ Plan:  End Stage Renal Disease History of hypertension and Coronary Artery Disease. He has a history of MI and Congestive heart failure with an EF 45 % No complaints of shortness of breath or chest pain No orthopnea and no ankle or leg swelling Does have some abdominal distention and bloated feeling post operatively Has concerns about going back home with the power outages   1. ESRD Appreciate Dr Johney Maine Placed PD catheter in patient 10/11  2. Hypokalemia  Improved  2. Volume No appreciable volume overload - however may want to restart home dose of lasix although he can continue to hold metolazone 3. Anemia stable 4. Bones stable at this time 5. Weakness Bifascicular block on EKG Bradycardic  resolved  Continue metoprolol at reduced dose 6. Hypotensive would hold antihypertensives I have stopped hydrazalazine and imdur  Heart rate and blood pressure significantly improved with the changes to blood pressure medications. Appreciate cardiology   Would restart lasix 80mg  daily and would have patient make appointment with Home Training Coordinator in AM for appointment and assessment of fluid status, blood pressure and pulse. Will need close follow up this coming week with Nephrology Team in [place]    LOS: 0 Isabeau Mccalla W @TODAY @10 :19 AM

## 2016-11-02 NOTE — Discharge Summary (Signed)
[facility] Discharge Summary   Patient ID: [name] MRN: [id number] DOB/AGE: March 27, 1928 83 y.o.  Admit date: 10/30/2016 Discharge date: 11/03/2016  Admitting Diagnosis: ESRD with need for peritoneal dialysis catheter placement  Discharge Diagnosis Patient Active Problem List   Diagnosis Date Noted  . Bradycardia   . Diplopia   . Dizziness   . Hypotension due to drugs   . Essential hypertension 02/06/2015  . Moderate persistent asthma in adult without complication 12/45/8099  . Cardiomyopathy, ischemic- EF 45% 2D 02/27/13 03/01/2013  . [place] thrombus of heart-old, no need for anticoagulation 02/28/2013  . Chronic combined systolic and diastolic CHF (congestive heart failure) ([place]) 02/28/2013  . CKD (chronic kidney disease) stage 5, needing CAPD Dialysis 02/26/2013  . Benign prostate hyperplasia 11/24/2011  . Osteoarthritis 07/15/2010  . Right bundle branch block 07/15/2010    Consultants Nephrology  Cardiology   Imaging: No results found.  Procedures Dr. Johney Maine 10/30/16 - placement of peritoneal dialysis catheter  Hospital Course:  Patient is a 83 y/o male with PMH significant for ESRD, CHF, HTN, CAD, Anemia of chronic disease. He presented for PD catheter placement.  Tolerated procedure well and was transferred to the floor.  Diet was advanced as tolerated. Patient initially planned to be discharged 10/12 but developed some dizziness and was feeling more weak than usual. EKG done and showed bifasicular block with bradycardia. Slight elevation in troponin seen. Cardiology consulted and recommended holding antihypertensives and decreasing beta blocker dose. Nephrology was in agreement with this. Repeat troponin was pending down. Patient improved with changes in medications.     On POD#3, the patient was voiding well, tolerating diet, ambulating well, pain well controlled, vital signs stable, incisions c/d/i and felt stable for discharge home.  Patient will  follow up with Dr. Johney Maine as needed and knows to call with questions or concerns.     Allergies as of 11/02/2016      Reactions   Gentamycin [gentamicin] Other (See Comments)   Decreased kidney fx   Crestor [rosuvastatin Calcium] Other (See Comments)   MUSCLE PAIN   Hctz [hydrochlorothiazide] Other (See Comments)   dizziness   Lipitor [atorvastatin Calcium] Other (See Comments)   MUSCLE PAIN      Medication List    STOP taking these medications   hydrALAZINE 50 MG tablet Commonly known as:  APRESOLINE   isosorbide mononitrate 60 MG 24 hr tablet Commonly known as:  IMDUR   metolazone 2.5 MG tablet Commonly known as:  ZAROXOLYN     TAKE these medications   albuterol (2.5 MG/3ML) 0.083% nebulizer solution Commonly known as:  PROVENTIL INHALE EVERY 4-6HRS AS NEEDED FOR WHEEZING/SHORTNESS OF BREATH   allopurinol 100 MG tablet Commonly known as:  ZYLOPRIM Take one half tablet by mouth once daily. ADDITIONAL REFILLS FROM PCP What changed:  how much to take  how to take this  when to take this  additional instructions   aspirin 81 MG tablet Take 81 mg by mouth every other day.   clopidogrel 75 MG tablet Commonly known as:  PLAVIX TAKE 1 TABLET BY MOUTH EVERY DAY What changed:  See the new instructions.   furosemide 80 MG tablet Commonly known as:  LASIX Take 80 mg by mouth daily.   metoprolol tartrate 25 MG tablet Commonly known as:  LOPRESSOR Take 0.5 tablets (12.5 mg total) by mouth 2 (two) times daily. What changed:  how much to take   nitroGLYCERIN 0.4 MG SL tablet Commonly known as:  NITROSTAT  Place 1 tablet (0.4 mg total) under the tongue every 5 (five) minutes as needed for chest pain (chest pain).   SOOTHE OP Place 1 drop into both eyes as needed (for dry eyes).   traMADol 50 MG tablet Commonly known as:  ULTRAM Take 1 tablet (50 mg total) by mouth every 6 (six) hours as needed for moderate pain or severe pain.   triamcinolone ointment 0.1  % Commonly known as:  KENALOG Apply 1 application topically daily as needed (for itchy skin).        Follow-up Information    Martinique, Peter M, MD. Schedule an appointment as soon as possible for a visit in 2 month(s).   Specialty:  Cardiology Contact information: [address] [place] [address] [phone]        Call Michael Boston, MD.   Specialty:  General Surgery Why:  See [place] only as needed Contact information: [place] [address] [phone]        Fran Lowes, MD. Schedule an appointment as soon as possible for a visit in 3 day(s).   Specialty:  Nephrology Why:  For dressing removal & flushing of CAPD catheter.  Call [facility] information: [address] [address] [phone]        Dialysis Center Follow up.   Why:  Call your dialysis center to set up home training for peritoneal dialysis tomorrow          Signed: Brigid Re [facility] 11/03/2016, 2:18 PM Pager: [phone] Consults: [phone] Mon-Fri 7:00 am-4:30 pm Sat-Sun 7:00 am-11:30 am

## 2016-11-11 ENCOUNTER — Emergency Department (HOSPITAL_COMMUNITY): Payer: Medicare HMO

## 2016-11-11 ENCOUNTER — Inpatient Hospital Stay (HOSPITAL_COMMUNITY)
Admission: EM | Admit: 2016-11-11 | Discharge: 2016-11-20 | DRG: 280 | Disposition: A | Discharge status: Hospice | Payer: Medicare HMO | Attending: Internal Medicine | Admitting: Internal Medicine

## 2016-11-11 ENCOUNTER — Encounter (HOSPITAL_COMMUNITY): Payer: Self-pay

## 2016-11-11 DIAGNOSIS — Z9181 History of falling: Secondary | ICD-10-CM | POA: Diagnosis not present

## 2016-11-11 DIAGNOSIS — I5043 Acute on chronic combined systolic (congestive) and diastolic (congestive) heart failure: Secondary | ICD-10-CM | POA: Diagnosis not present

## 2016-11-11 DIAGNOSIS — I132 Hypertensive heart and chronic kidney disease with heart failure and with stage 5 chronic kidney disease, or end stage renal disease: Secondary | ICD-10-CM | POA: Diagnosis present

## 2016-11-11 DIAGNOSIS — M109 Gout, unspecified: Secondary | ICD-10-CM | POA: Diagnosis present

## 2016-11-11 DIAGNOSIS — Z452 Encounter for adjustment and management of vascular access device: Secondary | ICD-10-CM

## 2016-11-11 DIAGNOSIS — F0390 Unspecified dementia without behavioral disturbance: Secondary | ICD-10-CM | POA: Diagnosis present

## 2016-11-11 DIAGNOSIS — E1122 Type 2 diabetes mellitus with diabetic chronic kidney disease: Secondary | ICD-10-CM | POA: Diagnosis present

## 2016-11-11 DIAGNOSIS — N185 Chronic kidney disease, stage 5: Secondary | ICD-10-CM | POA: Diagnosis not present

## 2016-11-11 DIAGNOSIS — J9601 Acute respiratory failure with hypoxia: Secondary | ICD-10-CM | POA: Diagnosis not present

## 2016-11-11 DIAGNOSIS — J9600 Acute respiratory failure, unspecified whether with hypoxia or hypercapnia: Secondary | ICD-10-CM

## 2016-11-11 DIAGNOSIS — R29810 Facial weakness: Secondary | ICD-10-CM | POA: Diagnosis not present

## 2016-11-11 DIAGNOSIS — Z515 Encounter for palliative care: Secondary | ICD-10-CM | POA: Diagnosis not present

## 2016-11-11 DIAGNOSIS — G9340 Encephalopathy, unspecified: Secondary | ICD-10-CM | POA: Diagnosis not present

## 2016-11-11 DIAGNOSIS — D649 Anemia, unspecified: Secondary | ICD-10-CM | POA: Diagnosis not present

## 2016-11-11 DIAGNOSIS — N186 End stage renal disease: Secondary | ICD-10-CM | POA: Diagnosis not present

## 2016-11-11 DIAGNOSIS — R1310 Dysphagia, unspecified: Secondary | ICD-10-CM | POA: Diagnosis present

## 2016-11-11 DIAGNOSIS — G9341 Metabolic encephalopathy: Secondary | ICD-10-CM | POA: Diagnosis not present

## 2016-11-11 DIAGNOSIS — N400 Enlarged prostate without lower urinary tract symptoms: Secondary | ICD-10-CM | POA: Diagnosis present

## 2016-11-11 DIAGNOSIS — D631 Anemia in chronic kidney disease: Secondary | ICD-10-CM | POA: Diagnosis present

## 2016-11-11 DIAGNOSIS — Z7189 Other specified counseling: Secondary | ICD-10-CM

## 2016-11-11 DIAGNOSIS — Z992 Dependence on renal dialysis: Secondary | ICD-10-CM | POA: Diagnosis not present

## 2016-11-11 DIAGNOSIS — R0602 Shortness of breath: Secondary | ICD-10-CM | POA: Diagnosis not present

## 2016-11-11 DIAGNOSIS — K922 Gastrointestinal hemorrhage, unspecified: Secondary | ICD-10-CM | POA: Diagnosis not present

## 2016-11-11 DIAGNOSIS — R17 Unspecified jaundice: Secondary | ICD-10-CM | POA: Diagnosis present

## 2016-11-11 DIAGNOSIS — D689 Coagulation defect, unspecified: Secondary | ICD-10-CM | POA: Diagnosis present

## 2016-11-11 DIAGNOSIS — R739 Hyperglycemia, unspecified: Secondary | ICD-10-CM | POA: Diagnosis not present

## 2016-11-11 DIAGNOSIS — I639 Cerebral infarction, unspecified: Secondary | ICD-10-CM | POA: Diagnosis not present

## 2016-11-11 DIAGNOSIS — R9089 Other abnormal findings on diagnostic imaging of central nervous system: Secondary | ICD-10-CM | POA: Diagnosis not present

## 2016-11-11 DIAGNOSIS — Z79899 Other long term (current) drug therapy: Secondary | ICD-10-CM

## 2016-11-11 DIAGNOSIS — I480 Paroxysmal atrial fibrillation: Secondary | ICD-10-CM | POA: Diagnosis present

## 2016-11-11 DIAGNOSIS — R0682 Tachypnea, not elsewhere classified: Secondary | ICD-10-CM

## 2016-11-11 DIAGNOSIS — I255 Ischemic cardiomyopathy: Secondary | ICD-10-CM | POA: Diagnosis present

## 2016-11-11 DIAGNOSIS — I472 Ventricular tachycardia: Secondary | ICD-10-CM | POA: Diagnosis not present

## 2016-11-11 DIAGNOSIS — I272 Pulmonary hypertension, unspecified: Secondary | ICD-10-CM | POA: Diagnosis present

## 2016-11-11 DIAGNOSIS — I10 Essential (primary) hypertension: Secondary | ICD-10-CM | POA: Diagnosis not present

## 2016-11-11 DIAGNOSIS — R627 Adult failure to thrive: Secondary | ICD-10-CM | POA: Diagnosis present

## 2016-11-11 DIAGNOSIS — D62 Acute posthemorrhagic anemia: Secondary | ICD-10-CM | POA: Diagnosis not present

## 2016-11-11 DIAGNOSIS — R918 Other nonspecific abnormal finding of lung field: Secondary | ICD-10-CM | POA: Diagnosis not present

## 2016-11-11 DIAGNOSIS — E669 Obesity, unspecified: Secondary | ICD-10-CM | POA: Diagnosis present

## 2016-11-11 DIAGNOSIS — Z7902 Long term (current) use of antithrombotics/antiplatelets: Secondary | ICD-10-CM

## 2016-11-11 DIAGNOSIS — R451 Restlessness and agitation: Secondary | ICD-10-CM | POA: Diagnosis not present

## 2016-11-11 DIAGNOSIS — I2510 Atherosclerotic heart disease of native coronary artery without angina pectoris: Secondary | ICD-10-CM | POA: Diagnosis present

## 2016-11-11 DIAGNOSIS — J189 Pneumonia, unspecified organism: Secondary | ICD-10-CM | POA: Diagnosis present

## 2016-11-11 DIAGNOSIS — R079 Chest pain, unspecified: Secondary | ICD-10-CM | POA: Diagnosis not present

## 2016-11-11 DIAGNOSIS — Z96652 Presence of left artificial knee joint: Secondary | ICD-10-CM | POA: Diagnosis present

## 2016-11-11 DIAGNOSIS — H9190 Unspecified hearing loss, unspecified ear: Secondary | ICD-10-CM | POA: Diagnosis present

## 2016-11-11 DIAGNOSIS — Z66 Do not resuscitate: Secondary | ICD-10-CM | POA: Diagnosis present

## 2016-11-11 DIAGNOSIS — E872 Acidosis: Secondary | ICD-10-CM | POA: Diagnosis not present

## 2016-11-11 DIAGNOSIS — I214 Non-ST elevation (NSTEMI) myocardial infarction: Secondary | ICD-10-CM | POA: Diagnosis not present

## 2016-11-11 DIAGNOSIS — J9602 Acute respiratory failure with hypercapnia: Secondary | ICD-10-CM | POA: Diagnosis not present

## 2016-11-11 DIAGNOSIS — M7981 Nontraumatic hematoma of soft tissue: Secondary | ICD-10-CM | POA: Diagnosis not present

## 2016-11-11 DIAGNOSIS — Z7982 Long term (current) use of aspirin: Secondary | ICD-10-CM

## 2016-11-11 DIAGNOSIS — Z95828 Presence of other vascular implants and grafts: Secondary | ICD-10-CM

## 2016-11-11 DIAGNOSIS — I4891 Unspecified atrial fibrillation: Secondary | ICD-10-CM | POA: Diagnosis not present

## 2016-11-11 DIAGNOSIS — D72829 Elevated white blood cell count, unspecified: Secondary | ICD-10-CM | POA: Diagnosis present

## 2016-11-11 DIAGNOSIS — K921 Melena: Secondary | ICD-10-CM | POA: Diagnosis present

## 2016-11-11 DIAGNOSIS — R0600 Dyspnea, unspecified: Secondary | ICD-10-CM | POA: Diagnosis not present

## 2016-11-11 DIAGNOSIS — Z789 Other specified health status: Secondary | ICD-10-CM

## 2016-11-11 DIAGNOSIS — I252 Old myocardial infarction: Secondary | ICD-10-CM

## 2016-11-11 DIAGNOSIS — I5042 Chronic combined systolic (congestive) and diastolic (congestive) heart failure: Secondary | ICD-10-CM | POA: Diagnosis not present

## 2016-11-11 DIAGNOSIS — I4510 Unspecified right bundle-branch block: Secondary | ICD-10-CM | POA: Diagnosis present

## 2016-11-11 DIAGNOSIS — Z8572 Personal history of non-Hodgkin lymphomas: Secondary | ICD-10-CM

## 2016-11-11 DIAGNOSIS — E1165 Type 2 diabetes mellitus with hyperglycemia: Secondary | ICD-10-CM | POA: Diagnosis present

## 2016-11-11 DIAGNOSIS — Z6830 Body mass index (BMI) 30.0-30.9, adult: Secondary | ICD-10-CM

## 2016-11-11 DIAGNOSIS — J440 Chronic obstructive pulmonary disease with acute lower respiratory infection: Secondary | ICD-10-CM | POA: Diagnosis present

## 2016-11-11 LAB — CBG MONITORING, ED: Glucose-Capillary: 193 mg/dL — ABNORMAL HIGH (ref 65–99)

## 2016-11-11 LAB — BASIC METABOLIC PANEL
Anion gap: 13 (ref 5–15)
BUN: 87 mg/dL — ABNORMAL HIGH (ref 6–20)
CHLORIDE: 107 mmol/L (ref 101–111)
CO2: 22 mmol/L (ref 22–32)
CREATININE: 4.29 mg/dL — AB (ref 0.61–1.24)
Calcium: 9.6 mg/dL (ref 8.9–10.3)
GFR calc non Af Amer: 11 mL/min — ABNORMAL LOW (ref >60)
GFR, EST AFRICAN AMERICAN: 13 mL/min — AB (ref >60)
Glucose, Bld: 202 mg/dL — ABNORMAL HIGH (ref 65–99)
POTASSIUM: 4.5 mmol/L (ref 3.5–5.1)
SODIUM: 142 mmol/L (ref 135–145)

## 2016-11-11 LAB — CBC
HCT: 34.3 % — ABNORMAL LOW (ref 39.0–52.0)
Hemoglobin: 11.1 g/dL — ABNORMAL LOW (ref 13.0–17.0)
MCH: 31.4 pg (ref 26.0–34.0)
MCHC: 32.4 g/dL (ref 30.0–36.0)
MCV: 96.9 fL (ref 78.0–100.0)
PLATELETS: 259 10*3/uL (ref 150–400)
RBC: 3.54 MIL/uL — AB (ref 4.22–5.81)
RDW: 15.5 % (ref 11.5–15.5)
WBC: 16.7 10*3/uL — ABNORMAL HIGH (ref 4.0–10.5)

## 2016-11-11 LAB — I-STAT CHEM 8, ED
BUN: 78 mg/dL — ABNORMAL HIGH (ref 6–20)
CALCIUM ION: 1.16 mmol/L (ref 1.15–1.40)
Chloride: 106 mmol/L (ref 101–111)
Creatinine, Ser: 4.3 mg/dL — ABNORMAL HIGH (ref 0.61–1.24)
GLUCOSE: 200 mg/dL — AB (ref 65–99)
HCT: 34 % — ABNORMAL LOW (ref 39.0–52.0)
HEMOGLOBIN: 11.6 g/dL — AB (ref 13.0–17.0)
POTASSIUM: 4.5 mmol/L (ref 3.5–5.1)
Sodium: 143 mmol/L (ref 135–145)
TCO2: 27 mmol/L (ref 22–32)

## 2016-11-11 LAB — I-STAT TROPONIN, ED: TROPONIN I, POC: 0.62 ng/mL — AB (ref 0.00–0.08)

## 2016-11-11 LAB — BRAIN NATRIURETIC PEPTIDE: B NATRIURETIC PEPTIDE 5: 2178.5 pg/mL — AB (ref 0.0–100.0)

## 2016-11-11 MED ORDER — ASPIRIN 81 MG PO CHEW
324.0000 mg | CHEWABLE_TABLET | Freq: Once | ORAL | Status: AC
  Administered 2016-11-11: 324 mg via ORAL
  Filled 2016-11-11: qty 4

## 2016-11-11 MED ORDER — NITROGLYCERIN IN D5W 200-5 MCG/ML-% IV SOLN
0.0000 ug/min | Freq: Once | INTRAVENOUS | Status: AC
  Administered 2016-11-11: 5 ug/min via INTRAVENOUS
  Filled 2016-11-11: qty 250

## 2016-11-11 MED ORDER — HEPARIN BOLUS VIA INFUSION
4000.0000 [IU] | Freq: Once | INTRAVENOUS | Status: AC
  Administered 2016-11-12: 4000 [IU] via INTRAVENOUS
  Filled 2016-11-11: qty 4000

## 2016-11-11 MED ORDER — NITROGLYCERIN 0.4 MG SL SUBL
0.4000 mg | SUBLINGUAL_TABLET | SUBLINGUAL | Status: DC | PRN
  Filled 2016-11-11: qty 1

## 2016-11-11 MED ORDER — HEPARIN (PORCINE) IN NACL 100-0.45 UNIT/ML-% IJ SOLN
800.0000 [IU]/h | INTRAMUSCULAR | Status: DC
  Administered 2016-11-12: 800 [IU]/h via INTRAVENOUS
  Filled 2016-11-11: qty 250

## 2016-11-11 NOTE — ED Provider Notes (Addendum)
[facility] EMERGENCY DEPARTMENT Provider Note   CSN: [id number] Arrival date & time: 11/11/16  2151     History   Chief Complaint Chief Complaint  Patient presents with  . Shortness of Breath  . Chest Pain    HPI [name] is a 83 y.o. male.  83 yo M with a chief complaint of chest pain.  Describes it as a pressure across his chest.  Worse with doing things to intense or fast.  Has some increased lower extremity edema as well.  He recently had a peritoneal dialysis catheter placed for upcoming dialysis.  Has a history of an MI and thinks this feels similar.  Denies diaphoresis nausea or vomiting.  Denies radiation.   The history is provided by the patient and the spouse.  Chest Pain   This is a new problem. The current episode started 3 to 5 hours ago. The problem occurs constantly. The problem has not changed since onset.The pain is associated with exertion. The pain is present in the substernal region. The pain is at a severity of 7/10. The pain is moderate. The quality of the pain is described as heavy. The pain does not radiate. Duration of episode(s) is 3 hours. The symptoms are aggravated by exertion. Associated symptoms include shortness of breath. Pertinent negatives include no abdominal pain, no fever, no headaches, no palpitations and no vomiting. He has tried nothing for the symptoms. The treatment provided no relief.  His past medical history is significant for MI.    Past Medical History:  Diagnosis Date  . Acute respiratory failure with hypoxia ([place]) 03/03/2013  . Asthma   . CAD (coronary artery disease)- MI in '09 02/28/2013  . Cancer ([place])    lymphoma  1979    . CHF (congestive heart failure) ([place])    2015  . Chronic renal insufficiency    creatinine ranges around 2.8  . Complication of anesthesia    No anesthesia complications, but concerns about neck positioning due to cervical disease  . DOE (dyspnea on exertion)   . Dyspnea    upon exertion  . Gout   . HCAP (healthcare-associated pneumonia) 03/03/2013  . Herpes zoster 12/2009   left eye ..........last flare up "a long time"  . History of BPH   . HOH (hard of hearing)    hears well out of left ear  . Hypertension   . IHD (ischemic heart disease)    Remote MI in 2009 with late presentation; no reperfusion. Has large area of infarct in the apical anterior area on last nuclear in 2011. Managed medically  . Influenza A 03/03/2013  . MI, old 03/2007   ACUTE ANTEROSEPTAL  . NSTEMI (non-ST elevated myocardial infarction)- type 2 02/28/2013  . OA (osteoarthritis) of knee    Left knee with revision and replacement of prosthetic L TKR  . Peripheral edema   . RBBB (right bundle branch block)   . Renal insufficiency   . Staph infection    states that gentamycin & vancomycin, (30 - 45 days worth) are what did his kidneys in, per his daughter    Patient Active Problem List   Diagnosis Date Noted  . Bradycardia   . Diplopia   . Dizziness   . Hypotension due to drugs   . Essential hypertension 02/06/2015  . Moderate persistent asthma in adult without complication 75/10/2583  . Cardiomyopathy, ischemic- EF 45% 2D 02/27/13 03/01/2013  . Mural thrombus of heart-old, no need for anticoagulation  02/28/2013  . Chronic combined systolic and diastolic CHF (congestive heart failure) ([place]) 02/28/2013  . CKD (chronic kidney disease) stage 5, needing CAPD Dialysis 02/26/2013  . Benign prostate hyperplasia 11/24/2011  . Osteoarthritis 07/15/2010  . Right bundle branch block 07/15/2010    Past Surgical History:  Procedure Laterality Date  . BACK SURGERY     has had 3 surgeries on his back  . BILATERAL CARPAL TUNNEL RELEASE    . BUNIONECTOMY    . CAPD INSERTION N/A 10/30/2016   Procedure: LAPAROSCOPIC INSERTION CONTINUOUS AMBULATORY PERITONEAL DIALYSIS  (CAPD) CATHETER   OMENTOPEXY;  Surgeon: Michael Boston, MD;  Location: [facility];  Service: General;  Laterality: N/A;  . CARDIAC  CATHETERIZATION  03/24/2007  . CARDIOVASCULAR STRESS TEST  04/02/2009   EF 27% AND A LARGE AREA OF INFARCT IN THE APICAL ANTERIOR WITH MILD PERI-INFARCT ISCHEMIA  . FRACTURE SURGERY     both ankles  . INSERTION OF DIALYSIS CATHETER  10/30/2016   LAPAROSCOPIC INSERTION CONTINUOUS AMBULATORY PERITONEAL DIALYSIS  (CAPD) CATHETER   OMENTOPEXY  . JOINT REPLACEMENT     3 on right hip, 2 on knee  . KNEE SURGERY  1998   left knee  . neck surgeries (x2)    . TOE AMPUTATION     LEFT FOOT  . TOTAL KNEE ARTHROPLASTY     left knee  . TRANSTHORACIC ECHOCARDIOGRAM  04/04/2009   EF 45-50%  . TRANSURETHRAL RESECTION OF PROSTATE         Home Medications    Prior to Admission medications   Medication Sig Start Date End Date Taking? Authorizing Provider  albuterol (PROVENTIL) (2.5 MG/3ML) 0.083% nebulizer solution INHALE EVERY 4-6HRS AS NEEDED FOR WHEEZING/SHORTNESS OF BREATH 10/01/16  Yes [provider]  allopurinol (ZYLOPRIM) 100 MG tablet Take one half tablet by mouth once daily. ADDITIONAL REFILLS FROM PCP Patient taking differently: Take 50 mg by mouth daily.  02/05/15  Yes Darlin Coco, MD  aspirin 81 MG tablet Take 81 mg by mouth every other day.     Yes [provider]  clopidogrel (PLAVIX) 75 MG tablet TAKE 1 TABLET BY MOUTH EVERY DAY Patient taking differently: TAKE 75 MG BY MOUTH EVERY DAY 09/08/16  Yes Martinique, Peter M, MD  furosemide (LASIX) 80 MG tablet Take 40 mg by mouth daily.    Yes [provider]  metoprolol tartrate (LOPRESSOR) 25 MG tablet Take 0.5 tablets (12.5 mg total) by mouth 2 (two) times daily. 11/02/16  Yes Rayburn, Floyce Stakes, PA-C  nitroGLYCERIN (NITROSTAT) 0.4 MG SL tablet Place 1 tablet (0.4 mg total) under the tongue every 5 (five) minutes as needed for chest pain (chest pain). 05/02/16  Yes Martinique, Peter M, MD  Propylene Glycol-Glycerin (SOOTHE OP) Place 1 drop into both eyes as needed (for dry eyes).   Yes [provider]  traMADol  (ULTRAM) 50 MG tablet Take 1 tablet (50 mg total) by mouth every 6 (six) hours as needed for moderate pain or severe pain. 10/30/16  Yes Michael Boston, MD  triamcinolone ointment (KENALOG) 0.1 % Apply 1 application topically daily as needed (for itchy skin).  10/17/14  Yes [provider]    Family History Family History  Problem Relation Age of Onset  . Coronary artery disease Mother   . Emphysema Father        never smoked, worked as a Psychologist, sport and exercise    Social History Social History  Substance Use Topics  . Smoking status: Never Smoker  . Smokeless  tobacco: Never Used  . Alcohol use No     Allergies   Gentamycin [gentamicin]; Crestor [rosuvastatin calcium]; Hctz [hydrochlorothiazide]; and Lipitor [atorvastatin calcium]   Review of Systems Review of Systems  Constitutional: Negative for chills and fever.  HENT: Negative for congestion and facial swelling.   Eyes: Negative for discharge and visual disturbance.  Respiratory: Positive for shortness of breath.   Cardiovascular: Positive for chest pain and leg swelling. Negative for palpitations.  Gastrointestinal: Negative for abdominal pain, diarrhea and vomiting.  Musculoskeletal: Negative for arthralgias and myalgias.  Skin: Negative for color change and rash.  Neurological: Negative for tremors, syncope and headaches.  Psychiatric/Behavioral: Negative for confusion and dysphoric mood.     Physical Exam Updated Vital Signs BP (!) 146/95   Pulse 96   Temp 99.1 F (37.3 C) (Oral)   Resp (!) 23   Ht 5\' 4"  (1.626 m)   Wt 79.4 kg (175 lb)   SpO2 99%   BMI 30.04 kg/m   Physical Exam  Constitutional: He is oriented to person, place, and time. He appears well-developed and well-nourished.  HENT:  Head: Normocephalic and atraumatic.  Eyes: Pupils are equal, round, and reactive to light. EOM are normal.  Neck: Normal range of motion. Neck supple. No JVD present.  Cardiovascular: Normal rate and regular rhythm.  Exam  reveals no gallop and no friction rub.   No murmur heard. Pulmonary/Chest: No respiratory distress. He has no wheezes.  Tachypnea  Abdominal: He exhibits distension. He exhibits no mass. There is no tenderness. There is no rebound and no guarding.  At baseline per family  Musculoskeletal: Normal range of motion. He exhibits edema (2+ up to the shins).  Neurological: He is alert and oriented to person, place, and time.  Skin: No rash noted. No pallor.  Psychiatric: He has a normal mood and affect. His behavior is normal.  Nursing note and vitals reviewed.    ED Treatments / Results  Labs (all labs ordered are listed, but only abnormal results are displayed) Labs Reviewed  BASIC METABOLIC PANEL - Abnormal; Notable for the following:       Result Value   Glucose, Bld 202 (*)    BUN 87 (*)    Creatinine, Ser 4.29 (*)    GFR calc non Af Amer 11 (*)    GFR calc Af Amer 13 (*)    All other components within normal limits  CBC - Abnormal; Notable for the following:    WBC 16.7 (*)    RBC 3.54 (*)    Hemoglobin 11.1 (*)    HCT 34.3 (*)    All other components within normal limits  BRAIN NATRIURETIC PEPTIDE - Abnormal; Notable for the following:    B Natriuretic Peptide 2,178.5 (*)    All other components within normal limits  CBG MONITORING, ED - Abnormal; Notable for the following:    Glucose-Capillary 193 (*)    All other components within normal limits  I-STAT TROPONIN, ED - Abnormal; Notable for the following:    Troponin i, poc 0.62 (*)    All other components within normal limits  I-STAT CHEM 8, ED - Abnormal; Notable for the following:    BUN 78 (*)    Creatinine, Ser 4.30 (*)    Glucose, Bld 200 (*)    Hemoglobin 11.6 (*)    HCT 34.0 (*)    All other components within normal limits  HEPARIN LEVEL (UNFRACTIONATED)    EKG  EKG Interpretation  Date/Time:  Tuesday November 11 2016 21:54:58 EDT Ventricular Rate:  111 PR Interval:    QRS Duration: 139 QT  Interval:  418 QTC Calculation: 569 R Axis:   -75 Text Interpretation:  Sinus tachycardia RBBB and LAFB Since last tracing rate faster diffucult to interpret ST segments with RBBB Otherwise no significant change Confirmed by Deno Etienne [phone]) on 11/12/2016 12:03:50 AM       Radiology Dg Chest 2 View  Result Date: 11/11/2016 CLINICAL DATA:  83 year old male with chest pain.  History of MI. EXAM: CHEST  2 VIEW COMPARISON:  Chest radiograph dated 10/31/2016 FINDINGS: Cardiac monitor leads overlie the patient. There is stable cardiomegaly. There is vascular congestion, new or worsened compared to the prior radiograph. Superimposed pneumonia is not excluded. Clinical correlation is recommended. No large pleural effusion. There is no pneumothorax. A small stent noted over the right hemidiaphragm as seen on the prior radiograph. There is osteopenia with extensive degenerative changes of the spine. There are degenerative changes of the shoulder with flattening of the right humeral head. Cervical anterior fusion plate and screw as well as lower thoracic and lumbar posterior fixation hardware noted. IMPRESSION: 1. Cardiomegaly with vascular congestion. Pneumonia is not excluded. Clinical correlation is recommended. 2. Extensive degenerative changes of the spine and shoulders as well as lower thoracic and lumbar posterior fixation hardware. Electronically Signed   By: Anner Crete M.D.   On: 11/11/2016 22:59    Procedures Procedures (including critical care time)  Medications Ordered in ED Medications  nitroGLYCERIN (NITROSTAT) SL tablet 0.4 mg (not administered)  heparin bolus via infusion 4,000 Units (not administered)  heparin ADULT infusion 100 units/mL (25000 units/275mL sodium chloride 0.45%) (not administered)  aspirin chewable tablet 324 mg (324 mg Oral Given 11/11/16 2235)  nitroGLYCERIN 50 mg in dextrose 5 % 250 mL (0.2 mg/mL) infusion (10 mcg/min Intravenous Rate/Dose Change 11/11/16  2344)     Initial Impression / Assessment and Plan / ED Course  I have reviewed the triage vital signs and the nursing notes.  Pertinent labs & imaging results that were available during my care of the patient were reviewed by me and considered in my medical decision making (see chart for details).     83 yo M with a chief complaint of chest pain.  Somewhat typical in character.  Patient has a troponin of 0.6.  I started him on a heparin and nitro drip.  I discussed the case with Dr. Aundra Dubin.  As the patient has a history of CKD could be a very complicated medical patient to catheterize.  Recommended treatment as started before.  Trend troponins.  Hospitalist admit.  CRITICAL CARE Performed by: Cecilio Asper   Total critical care time: 35 minutes  Critical care time was exclusive of separately billable procedures and treating other patients.  Critical care was necessary to treat or prevent imminent or life-threatening deterioration.  Critical care was time spent personally by me on the following activities: development of treatment plan with patient and/or surrogate as well as nursing, discussions with consultants, evaluation of patient's response to treatment, examination of patient, obtaining history from patient or surrogate, ordering and performing treatments and interventions, ordering and review of laboratory studies, ordering and review of radiographic studies, pulse oximetry and re-evaluation of patient's condition.  The patients results and plan were reviewed and discussed.   Any x-rays performed were independently reviewed by myself.   Differential diagnosis were considered with the presenting HPI.  Medications  nitroGLYCERIN (NITROSTAT)  SL tablet 0.4 mg (not administered)  heparin bolus via infusion 4,000 Units (not administered)  heparin ADULT infusion 100 units/mL (25000 units/272mL sodium chloride 0.45%) (not administered)  aspirin chewable tablet 324 mg (324 mg  Oral Given 11/11/16 2235)  nitroGLYCERIN 50 mg in dextrose 5 % 250 mL (0.2 mg/mL) infusion (10 mcg/min Intravenous Rate/Dose Change 11/11/16 2344)    Vitals:   11/11/16 2315 11/11/16 2330 11/11/16 2345 11/12/16 0000  BP: (!) 142/97 (!) 144/96 (!) 147/96 (!) 146/95  Pulse: 96 95 94 96  Resp: (!) 26 (!) 25 (!) 24 (!) 23  Temp:      TempSrc:      SpO2: 94% 94% 99% 99%  Weight:      Height:        Final diagnoses:  NSTEMI (non-ST elevated myocardial infarction) ([place])    Admission/ observation were discussed with the admitting physician, patient and/or family and they are comfortable with the plan.    Final Clinical Impressions(s) / ED Diagnoses   Final diagnoses:  NSTEMI (non-ST elevated myocardial infarction) [facility])    New Prescriptions New Prescriptions   No medications on file     Deno Etienne, DO 11/11/16 [place], [place], DO 11/12/16 0004

## 2016-11-11 NOTE — Progress Notes (Signed)
ANTICOAGULATION CONSULT NOTE - Initial Consult  Pharmacy Consult for Heparin  Indication: chest pain/ACS  Allergies  Allergen Reactions  . Gentamycin [Gentamicin] Other (See Comments)    Decreased kidney fx  . Crestor [Rosuvastatin Calcium] Other (See Comments)    MUSCLE PAIN  . Hctz [Hydrochlorothiazide] Other (See Comments)    dizziness  . Lipitor [Atorvastatin Calcium] Other (See Comments)    MUSCLE PAIN   Patient Measurements: Height: 5\' 4"  (162.6 cm) Weight: 175 lb (79.4 kg) IBW/kg (Calculated) : 59.2  Vital Signs: Temp: 99.1 F (37.3 C) (10/23 2157) Temp Source: Oral (10/23 2157) BP: 151/93 (10/23 2215) Pulse Rate: 99 (10/23 2215)  Labs:  Recent Labs  11/11/16 2217 11/11/16 2240  HGB 11.1* 11.6*  HCT 34.3* 34.0*  PLT 259  --   CREATININE 4.29* 4.30*    Estimated Creatinine Clearance: 12 mL/min (A) (by C-G formula based on SCr of 4.3 mg/dL (H)).   Medical History: Past Medical History:  Diagnosis Date  . Acute respiratory failure with hypoxia ([place]) 03/03/2013  . Asthma   . CAD (coronary artery disease)- MI in '09 02/28/2013  . Cancer ([place])    lymphoma  1979    . CHF (congestive heart failure) ([place])    2015  . Chronic renal insufficiency    creatinine ranges around 2.8  . Complication of anesthesia    No anesthesia complications, but concerns about neck positioning due to cervical disease  . DOE (dyspnea on exertion)   . Dyspnea    upon exertion  . Gout   . HCAP (healthcare-associated pneumonia) 03/03/2013  . Herpes zoster 12/2009   left eye ..........last flare up "a long time"  . History of BPH   . HOH (hard of hearing)    hears well out of left ear  . Hypertension   . IHD (ischemic heart disease)    Remote MI in 2009 with late presentation; no reperfusion. Has large area of infarct in the apical anterior area on last nuclear in 2011. Managed medically  . Influenza A 03/03/2013  . MI, old 03/2007   ACUTE ANTEROSEPTAL  . NSTEMI (non-ST elevated  myocardial infarction)- type 2 02/28/2013  . OA (osteoarthritis) of knee    Left knee with revision and replacement of prosthetic L TKR  . Peripheral edema   . RBBB (right bundle branch block)   . Renal insufficiency   . Staph infection    states that gentamycin & vancomycin, (30 - 45 days worth) are what did his kidneys in, per his daughter   Assessment: 83 y/o M here with chest pain and shortness of breath. Mildly elevated troponin. Hgb 11.6. PTA meds reviewed.   Goal of Therapy:  Heparin level 0.3-0.7 units/ml Monitor platelets by anticoagulation protocol: Yes   Plan:  Heparin 4000 units BOLUS Start heparin drip at 800 units/hr 0800 HL Daily CBC/HL Monitor for bleeding   Narda Bonds 11/11/2016,11:03 PM

## 2016-11-11 NOTE — ED Triage Notes (Signed)
Pt arrived via [facility] from home c/o SOB starting this afternoon and chest pain starting 2 weeks ago.  Pain 0/10 on arrival.  Pt "set up" for peritoneal dialysis but has not started

## 2016-11-12 DIAGNOSIS — M7981 Nontraumatic hematoma of soft tissue: Secondary | ICD-10-CM | POA: Diagnosis not present

## 2016-11-12 DIAGNOSIS — I2510 Atherosclerotic heart disease of native coronary artery without angina pectoris: Secondary | ICD-10-CM | POA: Diagnosis not present

## 2016-11-12 DIAGNOSIS — J9602 Acute respiratory failure with hypercapnia: Secondary | ICD-10-CM | POA: Diagnosis not present

## 2016-11-12 DIAGNOSIS — Z7189 Other specified counseling: Secondary | ICD-10-CM | POA: Diagnosis not present

## 2016-11-12 DIAGNOSIS — M25559 Pain in unspecified hip: Secondary | ICD-10-CM | POA: Diagnosis not present

## 2016-11-12 DIAGNOSIS — D649 Anemia, unspecified: Secondary | ICD-10-CM | POA: Diagnosis not present

## 2016-11-12 DIAGNOSIS — I5043 Acute on chronic combined systolic (congestive) and diastolic (congestive) heart failure: Secondary | ICD-10-CM

## 2016-11-12 DIAGNOSIS — N184 Chronic kidney disease, stage 4 (severe): Secondary | ICD-10-CM | POA: Diagnosis not present

## 2016-11-12 DIAGNOSIS — Z96649 Presence of unspecified artificial hip joint: Secondary | ICD-10-CM | POA: Diagnosis not present

## 2016-11-12 DIAGNOSIS — C8510 Unspecified B-cell lymphoma, unspecified site: Secondary | ICD-10-CM | POA: Diagnosis not present

## 2016-11-12 DIAGNOSIS — R296 Repeated falls: Secondary | ICD-10-CM | POA: Diagnosis not present

## 2016-11-12 DIAGNOSIS — R17 Unspecified jaundice: Secondary | ICD-10-CM | POA: Diagnosis present

## 2016-11-12 DIAGNOSIS — M109 Gout, unspecified: Secondary | ICD-10-CM | POA: Diagnosis present

## 2016-11-12 DIAGNOSIS — G9341 Metabolic encephalopathy: Secondary | ICD-10-CM | POA: Diagnosis not present

## 2016-11-12 DIAGNOSIS — Z789 Other specified health status: Secondary | ICD-10-CM | POA: Diagnosis not present

## 2016-11-12 DIAGNOSIS — I5089 Other heart failure: Secondary | ICD-10-CM | POA: Diagnosis not present

## 2016-11-12 DIAGNOSIS — D72829 Elevated white blood cell count, unspecified: Secondary | ICD-10-CM | POA: Diagnosis not present

## 2016-11-12 DIAGNOSIS — R109 Unspecified abdominal pain: Secondary | ICD-10-CM | POA: Diagnosis not present

## 2016-11-12 DIAGNOSIS — I214 Non-ST elevation (NSTEMI) myocardial infarction: Principal | ICD-10-CM

## 2016-11-12 DIAGNOSIS — Z8572 Personal history of non-Hodgkin lymphomas: Secondary | ICD-10-CM | POA: Diagnosis not present

## 2016-11-12 DIAGNOSIS — I340 Nonrheumatic mitral (valve) insufficiency: Secondary | ICD-10-CM | POA: Diagnosis not present

## 2016-11-12 DIAGNOSIS — K922 Gastrointestinal hemorrhage, unspecified: Secondary | ICD-10-CM | POA: Diagnosis not present

## 2016-11-12 DIAGNOSIS — I5042 Chronic combined systolic (congestive) and diastolic (congestive) heart failure: Secondary | ICD-10-CM | POA: Diagnosis not present

## 2016-11-12 DIAGNOSIS — Z7409 Other reduced mobility: Secondary | ICD-10-CM | POA: Diagnosis not present

## 2016-11-12 DIAGNOSIS — E872 Acidosis: Secondary | ICD-10-CM | POA: Diagnosis not present

## 2016-11-12 DIAGNOSIS — I639 Cerebral infarction, unspecified: Secondary | ICD-10-CM | POA: Diagnosis not present

## 2016-11-12 DIAGNOSIS — J440 Chronic obstructive pulmonary disease with acute lower respiratory infection: Secondary | ICD-10-CM | POA: Diagnosis not present

## 2016-11-12 DIAGNOSIS — R9089 Other abnormal findings on diagnostic imaging of central nervous system: Secondary | ICD-10-CM | POA: Diagnosis not present

## 2016-11-12 DIAGNOSIS — D689 Coagulation defect, unspecified: Secondary | ICD-10-CM | POA: Diagnosis present

## 2016-11-12 DIAGNOSIS — E1165 Type 2 diabetes mellitus with hyperglycemia: Secondary | ICD-10-CM | POA: Diagnosis present

## 2016-11-12 DIAGNOSIS — Z992 Dependence on renal dialysis: Secondary | ICD-10-CM | POA: Diagnosis not present

## 2016-11-12 DIAGNOSIS — D631 Anemia in chronic kidney disease: Secondary | ICD-10-CM | POA: Diagnosis not present

## 2016-11-12 DIAGNOSIS — Z452 Encounter for adjustment and management of vascular access device: Secondary | ICD-10-CM | POA: Diagnosis not present

## 2016-11-12 DIAGNOSIS — N185 Chronic kidney disease, stage 5: Secondary | ICD-10-CM

## 2016-11-12 DIAGNOSIS — R0600 Dyspnea, unspecified: Secondary | ICD-10-CM | POA: Diagnosis present

## 2016-11-12 DIAGNOSIS — I480 Paroxysmal atrial fibrillation: Secondary | ICD-10-CM | POA: Diagnosis not present

## 2016-11-12 DIAGNOSIS — R4182 Altered mental status, unspecified: Secondary | ICD-10-CM | POA: Diagnosis not present

## 2016-11-12 DIAGNOSIS — R739 Hyperglycemia, unspecified: Secondary | ICD-10-CM

## 2016-11-12 DIAGNOSIS — I252 Old myocardial infarction: Secondary | ICD-10-CM | POA: Diagnosis not present

## 2016-11-12 DIAGNOSIS — I472 Ventricular tachycardia: Secondary | ICD-10-CM | POA: Diagnosis not present

## 2016-11-12 DIAGNOSIS — I132 Hypertensive heart and chronic kidney disease with heart failure and with stage 5 chronic kidney disease, or end stage renal disease: Secondary | ICD-10-CM | POA: Diagnosis not present

## 2016-11-12 DIAGNOSIS — I517 Cardiomegaly: Secondary | ICD-10-CM | POA: Diagnosis not present

## 2016-11-12 DIAGNOSIS — N186 End stage renal disease: Secondary | ICD-10-CM | POA: Diagnosis not present

## 2016-11-12 DIAGNOSIS — I4891 Unspecified atrial fibrillation: Secondary | ICD-10-CM | POA: Diagnosis not present

## 2016-11-12 DIAGNOSIS — K921 Melena: Secondary | ICD-10-CM | POA: Diagnosis not present

## 2016-11-12 DIAGNOSIS — G9340 Encephalopathy, unspecified: Secondary | ICD-10-CM | POA: Diagnosis not present

## 2016-11-12 DIAGNOSIS — J189 Pneumonia, unspecified organism: Secondary | ICD-10-CM | POA: Diagnosis not present

## 2016-11-12 DIAGNOSIS — D62 Acute posthemorrhagic anemia: Secondary | ICD-10-CM | POA: Diagnosis not present

## 2016-11-12 DIAGNOSIS — R918 Other nonspecific abnormal finding of lung field: Secondary | ICD-10-CM | POA: Diagnosis not present

## 2016-11-12 DIAGNOSIS — N189 Chronic kidney disease, unspecified: Secondary | ICD-10-CM | POA: Diagnosis not present

## 2016-11-12 DIAGNOSIS — T84498A Other mechanical complication of other internal orthopedic devices, implants and grafts, initial encounter: Secondary | ICD-10-CM | POA: Diagnosis not present

## 2016-11-12 DIAGNOSIS — Z515 Encounter for palliative care: Secondary | ICD-10-CM | POA: Diagnosis not present

## 2016-11-12 DIAGNOSIS — J9601 Acute respiratory failure with hypoxia: Secondary | ICD-10-CM | POA: Diagnosis not present

## 2016-11-12 DIAGNOSIS — R269 Unspecified abnormalities of gait and mobility: Secondary | ICD-10-CM | POA: Diagnosis not present

## 2016-11-12 DIAGNOSIS — R0602 Shortness of breath: Secondary | ICD-10-CM | POA: Diagnosis not present

## 2016-11-12 DIAGNOSIS — J9600 Acute respiratory failure, unspecified whether with hypoxia or hypercapnia: Secondary | ICD-10-CM | POA: Diagnosis not present

## 2016-11-12 DIAGNOSIS — I509 Heart failure, unspecified: Secondary | ICD-10-CM | POA: Diagnosis not present

## 2016-11-12 DIAGNOSIS — I4510 Unspecified right bundle-branch block: Secondary | ICD-10-CM | POA: Diagnosis present

## 2016-11-12 DIAGNOSIS — E1122 Type 2 diabetes mellitus with diabetic chronic kidney disease: Secondary | ICD-10-CM | POA: Diagnosis present

## 2016-11-12 LAB — BASIC METABOLIC PANEL
ANION GAP: 15 (ref 5–15)
BUN: 90 mg/dL — ABNORMAL HIGH (ref 6–20)
CALCIUM: 9.8 mg/dL (ref 8.9–10.3)
CHLORIDE: 105 mmol/L (ref 101–111)
CO2: 24 mmol/L (ref 22–32)
Creatinine, Ser: 4.33 mg/dL — ABNORMAL HIGH (ref 0.61–1.24)
GFR calc non Af Amer: 11 mL/min — ABNORMAL LOW (ref >60)
GFR, EST AFRICAN AMERICAN: 13 mL/min — AB (ref >60)
GLUCOSE: 116 mg/dL — AB (ref 65–99)
POTASSIUM: 4.8 mmol/L (ref 3.5–5.1)
Sodium: 144 mmol/L (ref 135–145)

## 2016-11-12 LAB — GLUCOSE, CAPILLARY
GLUCOSE-CAPILLARY: 105 mg/dL — AB (ref 65–99)
GLUCOSE-CAPILLARY: 153 mg/dL — AB (ref 65–99)

## 2016-11-12 LAB — PROCALCITONIN
PROCALCITONIN: 0.2 ng/mL
Procalcitonin: 0.22 ng/mL

## 2016-11-12 LAB — CBC
HCT: 34.4 % — ABNORMAL LOW (ref 39.0–52.0)
HEMOGLOBIN: 11 g/dL — AB (ref 13.0–17.0)
MCH: 30.9 pg (ref 26.0–34.0)
MCHC: 32 g/dL (ref 30.0–36.0)
MCV: 96.6 fL (ref 78.0–100.0)
Platelets: 275 10*3/uL (ref 150–400)
RBC: 3.56 MIL/uL — AB (ref 4.22–5.81)
RDW: 15.6 % — AB (ref 11.5–15.5)
WBC: 15 10*3/uL — AB (ref 4.0–10.5)

## 2016-11-12 LAB — HEPARIN LEVEL (UNFRACTIONATED): HEPARIN UNFRACTIONATED: 0.6 [IU]/mL (ref 0.30–0.70)

## 2016-11-12 LAB — TROPONIN I
TROPONIN I: 1.16 ng/mL — AB (ref <0.03)
Troponin I: 1.74 ng/mL (ref <0.03)
Troponin I: 1.96 ng/mL (ref <0.03)

## 2016-11-12 MED ORDER — PIPERACILLIN-TAZOBACTAM IN DEX 2-0.25 GM/50ML IV SOLN
2.2500 g | Freq: Three times a day (TID) | INTRAVENOUS | Status: DC
  Administered 2016-11-12 – 2016-11-14 (×5): 2.25 g via INTRAVENOUS
  Filled 2016-11-12 (×6): qty 50

## 2016-11-12 MED ORDER — METOPROLOL SUCCINATE ER 25 MG PO TB24
25.0000 mg | ORAL_TABLET | Freq: Every day | ORAL | Status: DC
  Administered 2016-11-13 – 2016-11-20 (×6): 25 mg via ORAL
  Filled 2016-11-12 (×6): qty 1

## 2016-11-12 MED ORDER — METOPROLOL TARTRATE 12.5 MG HALF TABLET
12.5000 mg | ORAL_TABLET | Freq: Two times a day (BID) | ORAL | Status: DC
  Administered 2016-11-12: 25 mg via ORAL
  Filled 2016-11-12: qty 1

## 2016-11-12 MED ORDER — FUROSEMIDE 10 MG/ML IJ SOLN
80.0000 mg | Freq: Two times a day (BID) | INTRAMUSCULAR | Status: DC
  Administered 2016-11-12: 80 mg via INTRAVENOUS
  Filled 2016-11-12 (×2): qty 8

## 2016-11-12 MED ORDER — ONDANSETRON HCL 4 MG/2ML IJ SOLN
4.0000 mg | Freq: Four times a day (QID) | INTRAMUSCULAR | Status: DC | PRN
  Filled 2016-11-12: qty 2

## 2016-11-12 MED ORDER — MORPHINE SULFATE (PF) 4 MG/ML IV SOLN: 2.0000 mg | INTRAVENOUS | Status: DC | PRN

## 2016-11-12 MED ORDER — VANCOMYCIN HCL 10 G IV SOLR
1500.0000 mg | Freq: Once | INTRAVENOUS | Status: AC
  Administered 2016-11-12: 1500 mg via INTRAVENOUS
  Filled 2016-11-12: qty 1500

## 2016-11-12 MED ORDER — FUROSEMIDE 40 MG PO TABS
40.0000 mg | ORAL_TABLET | Freq: Every day | ORAL | Status: DC
  Administered 2016-11-12: 40 mg via ORAL
  Filled 2016-11-12: qty 2

## 2016-11-12 MED ORDER — ACETAMINOPHEN 325 MG PO TABS: 650.0000 mg | ORAL_TABLET | ORAL | Status: DC | PRN

## 2016-11-12 MED ORDER — POLYVINYL ALCOHOL 1.4 % OP SOLN: 1.0000 [drp] | OPHTHALMIC | Status: DC | PRN

## 2016-11-12 MED ORDER — ASPIRIN 81 MG PO CHEW
81.0000 mg | CHEWABLE_TABLET | ORAL | Status: DC
  Administered 2016-11-12 – 2016-11-16 (×3): 81 mg via ORAL
  Filled 2016-11-12 (×4): qty 1

## 2016-11-12 MED ORDER — NITROGLYCERIN IN D5W 200-5 MCG/ML-% IV SOLN
0.0000 ug/min | Freq: Once | INTRAVENOUS | Status: DC
  Filled 2016-11-12: qty 250

## 2016-11-12 MED ORDER — ALLOPURINOL 100 MG PO TABS
50.0000 mg | ORAL_TABLET | Freq: Every day | ORAL | Status: DC
  Administered 2016-11-12 – 2016-11-20 (×7): 50 mg via ORAL
  Filled 2016-11-12: qty 1
  Filled 2016-11-12: qty 0.5
  Filled 2016-11-12 (×6): qty 1

## 2016-11-12 MED ORDER — INSULIN ASPART 100 UNIT/ML ~~LOC~~ SOLN: 0.0000 [IU] | Freq: Every day | SUBCUTANEOUS | Status: DC

## 2016-11-12 MED ORDER — INSULIN ASPART 100 UNIT/ML ~~LOC~~ SOLN
0.0000 [IU] | Freq: Three times a day (TID) | SUBCUTANEOUS | Status: DC
  Administered 2016-11-13 (×2): 2 [IU] via SUBCUTANEOUS
  Administered 2016-11-13: 1 [IU] via SUBCUTANEOUS
  Administered 2016-11-14 (×3): 2 [IU] via SUBCUTANEOUS
  Administered 2016-11-15: 3 [IU] via SUBCUTANEOUS
  Administered 2016-11-17: 1 [IU] via SUBCUTANEOUS

## 2016-11-12 MED ORDER — HEPARIN (PORCINE) IN NACL 100-0.45 UNIT/ML-% IJ SOLN
1050.0000 [IU]/h | INTRAMUSCULAR | Status: DC
  Administered 2016-11-12: 1050 [IU]/h via INTRAVENOUS
  Filled 2016-11-12: qty 250

## 2016-11-12 MED ORDER — TRAMADOL HCL 50 MG PO TABS
50.0000 mg | ORAL_TABLET | Freq: Four times a day (QID) | ORAL | Status: DC | PRN
  Administered 2016-11-19: 50 mg via ORAL
  Filled 2016-11-12: qty 1

## 2016-11-12 MED ORDER — HEPARIN (PORCINE) IN NACL 100-0.45 UNIT/ML-% IJ SOLN
1000.0000 [IU]/h | INTRAMUSCULAR | Status: DC
  Administered 2016-11-13 – 2016-11-16 (×4): 1000 [IU]/h via INTRAVENOUS
  Filled 2016-11-12 (×4): qty 250

## 2016-11-12 MED ORDER — CLOPIDOGREL BISULFATE 75 MG PO TABS
75.0000 mg | ORAL_TABLET | Freq: Every day | ORAL | Status: DC
  Administered 2016-11-12 – 2016-11-18 (×6): 75 mg via ORAL
  Filled 2016-11-12 (×6): qty 1

## 2016-11-12 MED ORDER — ALBUTEROL SULFATE (2.5 MG/3ML) 0.083% IN NEBU: 2.5000 mg | INHALATION_SOLUTION | RESPIRATORY_TRACT | Status: DC | PRN

## 2016-11-12 MED ORDER — VANCOMYCIN HCL IN DEXTROSE 1-5 GM/200ML-% IV SOLN
1000.0000 mg | INTRAVENOUS | Status: AC
  Administered 2016-11-14: 1000 mg via INTRAVENOUS
  Filled 2016-11-12: qty 200

## 2016-11-12 MED ORDER — HEPARIN BOLUS VIA INFUSION
2000.0000 [IU] | Freq: Once | INTRAVENOUS | Status: AC
  Administered 2016-11-12: 2000 [IU] via INTRAVENOUS
  Filled 2016-11-12: qty 2000

## 2016-11-12 NOTE — H&P (Signed)
History and Physical    [name] YQI:[id number] DOB: 17-Jan-1929 DOA: 11/11/2016  PCP: Celene Squibb, MD   Patient coming from: Home  Chief Complaint: Chest pain, SOB   HPI: [name] is a 83 y.o. male with medical history significant for medically managed coronary artery disease, chronic combined systolic/diastolic CHF, hypertension, and chronic kidney disease stage IV-V, now presenting to the emergency department for evaluation of chest discomfort and dyspnea.  Patient reports that he has had central chest discomfort and shortness of breath with exertion for several months, but it is always resolved within a few minutes with rest.  Today, patient was acutely dyspneic and experiencing chest discomfort, laid down as he usually does in these instances, but there was no relief with rest.  He continued to experience the discomfort localized to the central chest, described as "tightness," constant, worse with exertion, not relieved with rest as it usually is.  Denies recent fevers or chills.  ED Course: Upon arrival to the ED, patient is found to be afebrile, saturating adequately on room air, tachypneic, slightly tachycardic, and slightly hypertensive.  EKG features a sinus rhythm with chronic right bundle branch block.  Chest x-ray is notable for cardiomegaly with vascular congestion and pneumonia is unable to be excluded.  Chemistry panel reveals a BUN of 87, creatinine of 4.29, and serum glucose of 202.  CBC features a leukocytosis to 16,700 and days stable normocytic anemia with hemoglobin of 11.1.  Troponin is elevated to 0.62 and BNP is elevated to 2179.  Patient was treated with 324 mg of aspirin, started on heparin infusion, and started on nitroglycerin infusion in the ED.  Cardiology was consulted by the ED physician and recommended medical admission.  Patient has remained hemodynamically stable in the ED and will be admitted to the stepdown unit for ongoing evaluation and management of  NSTEMI.  Review of Systems:  All other systems reviewed and apart from HPI, are negative.  Past Medical History:  Diagnosis Date  . Acute respiratory failure with hypoxia ([place]) 03/03/2013  . Asthma   . CAD (coronary artery disease)- MI in '09 02/28/2013  . Cancer ([place])    lymphoma  1979    . CHF (congestive heart failure) ([place])    2015  . Chronic renal insufficiency    creatinine ranges around 2.8  . Complication of anesthesia    No anesthesia complications, but concerns about neck positioning due to cervical disease  . DOE (dyspnea on exertion)   . Dyspnea    upon exertion  . Gout   . HCAP (healthcare-associated pneumonia) 03/03/2013  . Herpes zoster 12/2009   left eye ..........last flare up "a long time"  . History of BPH   . HOH (hard of hearing)    hears well out of left ear  . Hypertension   . IHD (ischemic heart disease)    Remote MI in 2009 with late presentation; no reperfusion. Has large area of infarct in the apical anterior area on last nuclear in 2011. Managed medically  . Influenza A 03/03/2013  . MI, old 03/2007   ACUTE ANTEROSEPTAL  . NSTEMI (non-ST elevated myocardial infarction)- type 2 02/28/2013  . OA (osteoarthritis) of knee    Left knee with revision and replacement of prosthetic L TKR  . Peripheral edema   . RBBB (right bundle branch block)   . Renal insufficiency   . Staph infection    states that gentamycin & vancomycin, (30 - 45 days worth)  are what did his kidneys in, per his daughter    Past Surgical History:  Procedure Laterality Date  . BACK SURGERY     has had 3 surgeries on his back  . BILATERAL CARPAL TUNNEL RELEASE    . BUNIONECTOMY    . CAPD INSERTION N/A 10/30/2016   Procedure: LAPAROSCOPIC INSERTION CONTINUOUS AMBULATORY PERITONEAL DIALYSIS  (CAPD) CATHETER   OMENTOPEXY;  Surgeon: Michael Boston, MD;  Location: [facility];  Service: General;  Laterality: N/A;  . CARDIAC CATHETERIZATION  03/24/2007  . CARDIOVASCULAR STRESS TEST  04/02/2009   EF  27% AND A LARGE AREA OF INFARCT IN THE APICAL ANTERIOR WITH MILD PERI-INFARCT ISCHEMIA  . FRACTURE SURGERY     both ankles  . INSERTION OF DIALYSIS CATHETER  10/30/2016   LAPAROSCOPIC INSERTION CONTINUOUS AMBULATORY PERITONEAL DIALYSIS  (CAPD) CATHETER   OMENTOPEXY  . JOINT REPLACEMENT     3 on right hip, 2 on knee  . KNEE SURGERY  1998   left knee  . neck surgeries (x2)    . TOE AMPUTATION     LEFT FOOT  . TOTAL KNEE ARTHROPLASTY     left knee  . TRANSTHORACIC ECHOCARDIOGRAM  04/04/2009   EF 45-50%  . TRANSURETHRAL RESECTION OF PROSTATE       reports that he has never smoked. He has never used smokeless tobacco. He reports that he does not drink alcohol or use drugs.  Allergies  Allergen Reactions  . Gentamycin [Gentamicin] Other (See Comments)    Decreased kidney fx  . Crestor [Rosuvastatin Calcium] Other (See Comments)    MUSCLE PAIN  . Hctz [Hydrochlorothiazide] Other (See Comments)    dizziness  . Lipitor [Atorvastatin Calcium] Other (See Comments)    MUSCLE PAIN    Family History  Problem Relation Age of Onset  . Coronary artery disease Mother   . Emphysema Father        never smoked, worked as a Psychologist, sport and exercise     Prior to Admission medications   Medication Sig Start Date End Date Taking? Authorizing Provider  albuterol (PROVENTIL) (2.5 MG/3ML) 0.083% nebulizer solution INHALE EVERY 4-6HRS AS NEEDED FOR WHEEZING/SHORTNESS OF BREATH 10/01/16  Yes [provider]  allopurinol (ZYLOPRIM) 100 MG tablet Take one half tablet by mouth once daily. ADDITIONAL REFILLS FROM PCP Patient taking differently: Take 50 mg by mouth daily.  02/05/15  Yes Darlin Coco, MD  aspirin 81 MG tablet Take 81 mg by mouth every other day.     Yes [provider]  clopidogrel (PLAVIX) 75 MG tablet TAKE 1 TABLET BY MOUTH EVERY DAY Patient taking differently: TAKE 75 MG BY MOUTH EVERY DAY 09/08/16  Yes Martinique, Peter M, MD  furosemide (LASIX) 80 MG tablet Take 40 mg by mouth daily.     Yes [provider]  metoprolol tartrate (LOPRESSOR) 25 MG tablet Take 0.5 tablets (12.5 mg total) by mouth 2 (two) times daily. 11/02/16  Yes Rayburn, Floyce Stakes, PA-C  nitroGLYCERIN (NITROSTAT) 0.4 MG SL tablet Place 1 tablet (0.4 mg total) under the tongue every 5 (five) minutes as needed for chest pain (chest pain). 05/02/16  Yes Martinique, Peter M, MD  Propylene Glycol-Glycerin (SOOTHE OP) Place 1 drop into both eyes as needed (for dry eyes).   Yes [provider]  traMADol (ULTRAM) 50 MG tablet Take 1 tablet (50 mg total) by mouth every 6 (six) hours as needed for moderate pain or severe pain. 10/30/16  Yes Michael Boston, MD  triamcinolone ointment (KENALOG)  0.1 % Apply 1 application topically daily as needed (for itchy skin).  10/17/14  Yes [provider]    Physical Exam: Vitals:   11/11/16 2315 11/11/16 2330 11/11/16 2345 11/12/16 0000  BP: (!) 142/97 (!) 144/96 (!) 147/96 (!) 146/95  Pulse: 96 95 94 96  Resp: (!) 26 (!) 25 (!) 24 (!) 23  Temp:      TempSrc:      SpO2: 94% 94% 99% 99%  Weight:      Height:          Constitutional: NAD, calm, appears uncomfortable and chronically-ill  Eyes: PERTLA, lids and conjunctivae normal ENMT: Mucous membranes are moist. Posterior pharynx clear of any exudate or lesions.   Neck: normal, supple, no masses, no thyromegaly Respiratory: Mild tachypnea, no wheeze, rales at left base. No pallor or cyanosis.  Cardiovascular: S1 & S2 heard, regular rate and rhythm. Trace pretibial edema bilaterally. No significant JVD. Abdomen: No distension, no tenderness, no masses palpated. Bowel sounds normal.  Musculoskeletal: no clubbing / cyanosis. No joint deformity upper and lower extremities.   Skin: no significant rashes, lesions, ulcers. Warm, dry, well-perfused. Neurologic: CN 2-12 grossly intact. Sensation intact. Strength 5/5 in all 4 limbs.  Psychiatric: Alert and oriented x 3. Pleasant, cooperative.     Labs on  Admission: I have personally reviewed following labs and imaging studies  CBC:  Recent Labs Lab 11/11/16 2217 11/11/16 2240  WBC 16.7*  --   HGB 11.1* 11.6*  HCT 34.3* 34.0*  MCV 96.9  --   PLT 259  --    Basic Metabolic Panel:  Recent Labs Lab 11/11/16 2217 11/11/16 2240  NA 142 143  K 4.5 4.5  CL 107 106  CO2 22  --   GLUCOSE 202* 200*  BUN 87* 78*  CREATININE 4.29* 4.30*  CALCIUM 9.6  --    GFR: Estimated Creatinine Clearance: 12 mL/min (A) (by C-G formula based on SCr of 4.3 mg/dL (H)). Liver Function Tests: No results for input(s): AST, ALT, ALKPHOS, BILITOT, PROT, ALBUMIN in the last 168 hours. No results for input(s): LIPASE, AMYLASE in the last 168 hours. No results for input(s): AMMONIA in the last 168 hours. Coagulation Profile: No results for input(s): INR, PROTIME in the last 168 hours. Cardiac Enzymes: No results for input(s): CKTOTAL, CKMB, CKMBINDEX, TROPONINI in the last 168 hours. BNP (last 3 results) No results for input(s): PROBNP in the last 8760 hours. HbA1C: No results for input(s): HGBA1C in the last 72 hours. CBG:  Recent Labs Lab 11/11/16 2225  GLUCAP 193*   Lipid Profile: No results for input(s): CHOL, HDL, LDLCALC, TRIG, CHOLHDL, LDLDIRECT in the last 72 hours. Thyroid Function Tests: No results for input(s): TSH, T4TOTAL, FREET4, T3FREE, THYROIDAB in the last 72 hours. Anemia Panel: No results for input(s): VITAMINB12, FOLATE, FERRITIN, TIBC, IRON, RETICCTPCT in the last 72 hours. Urine analysis:    Component Value Date/Time   COLORURINE YELLOW 03/02/2013 2009   APPEARANCEUR CLEAR 03/02/2013 2009   LABSPEC 1.012 03/02/2013 2009   PHURINE 6.0 03/02/2013 2009   GLUCOSEU NEGATIVE 03/02/2013 2009   HGBUR NEGATIVE 03/02/2013 2009   BILIRUBINUR NEGATIVE 03/02/2013 2009   KETONESUR NEGATIVE 03/02/2013 2009   PROTEINUR 100 (A) 03/02/2013 2009   UROBILINOGEN 0.2 03/02/2013 2009   NITRITE NEGATIVE 03/02/2013 2009   LEUKOCYTESUR  NEGATIVE 03/02/2013 2009   Sepsis Labs: @LABRCNTIP (procalcitonin:4,lacticidven:4) )No results found for this or any previous visit (from the past 240 hour(s)).   Radiological Exams  on Admission: Dg Chest 2 View  Result Date: 11/11/2016 CLINICAL DATA:  84 year old male with chest pain.  History of MI. EXAM: CHEST  2 VIEW COMPARISON:  Chest radiograph dated 10/31/2016 FINDINGS: Cardiac monitor leads overlie the patient. There is stable cardiomegaly. There is vascular congestion, new or worsened compared to the prior radiograph. Superimposed pneumonia is not excluded. Clinical correlation is recommended. No large pleural effusion. There is no pneumothorax. A small stent noted over the right hemidiaphragm as seen on the prior radiograph. There is osteopenia with extensive degenerative changes of the spine. There are degenerative changes of the shoulder with flattening of the right humeral head. Cervical anterior fusion plate and screw as well as lower thoracic and lumbar posterior fixation hardware noted. IMPRESSION: 1. Cardiomegaly with vascular congestion. Pneumonia is not excluded. Clinical correlation is recommended. 2. Extensive degenerative changes of the spine and shoulders as well as lower thoracic and lumbar posterior fixation hardware. Electronically Signed   By: Anner Crete M.D.   On: 11/11/2016 22:59    EKG: Independently reviewed. Sinus rhythm, chronic RBBB, similar to prior.   Assessment/Plan  1. NSTEMI  - Pt has hx of MI in 2009 with occluded LAD treated medically, reports several months of dyspnea and chest discomfort on exertion, resolving with rest - Now presents with an episode of dyspnea and chest discomfort with minimal exertion that failed to resolve with rest at home  - Found to be in mild respiratory distress with troponin elevated to 0.62, EKG similar to prior, and CXR with vascular congestion  - He was treated with ASA 324 in ED and started on heparin and nitroglycerin  infusions  - Plan to continue heparin infusion, continue NTG, continue Lopressor, continue Plavix, trend troponin, continue cardiac monitoring, repeat EKG  - Cardiology is consulting and much appreciated, will follow-up on recommendations    2. CKD stage V  - Follows with nephrology, had peritoneal dialysis catheter placed this month, is scheduled to follow-up for that this week and suspects he will start PD after that   - BUN and SCr are stable, no acidosis or hyperkalemia  - Continue diuretics, repeat BMP in am    3. Chronic combined systolic/diastolic CHF  - Wt is down some from recent office visits, no edema on CXR and no JVD  - Plan to SLIV, follow daily wts and I/O's, continue Lasix 40 qD and beta-blocker    4. Leukocytosis - WBC is 16,700 on admission without fever  - He presents with respiratory complaints, but suspect this to be cardiac-related as above  - Culture if febrile   5. Hyperglycemia  - Serum glucose is 202 on admission  - No A1c on file, no hx of DM, likely secondary to the acute illness  - Check CBG with meals and qHS, and start a low-intensity SSI with Novolog      DVT prophylaxis: IV heparin infusion Code Status: Full  Family Communication: Wife updated at bedside Disposition Plan: Admit to SDU Consults called: Cardiology Admission status: Inpatient    Vianne Bulls, MD [facility] Pager [phone]  If 7PM-7AM, please contact night-coverage [email or web address] Password [email or web address]  11/12/2016, 12:20 AM

## 2016-11-12 NOTE — ED Notes (Signed)
Bed assigned on 3E, but taken away due to staffing per Bed Placement.

## 2016-11-12 NOTE — Progress Notes (Signed)
ANTICOAGULATION CONSULT NOTE - Follow Up Consult  Pharmacy Consult for Heparin  Indication: chest pain/ACS  Allergies  Allergen Reactions  . Gentamycin [Gentamicin] Other (See Comments)    Decreased kidney fx  . Crestor [Rosuvastatin Calcium] Other (See Comments)    MUSCLE PAIN  . Hctz [Hydrochlorothiazide] Other (See Comments)    dizziness  . Lipitor [Atorvastatin Calcium] Other (See Comments)    MUSCLE PAIN   Patient Measurements: Height: 5\' 4"  (162.6 cm) Weight: 175 lb (79.4 kg) IBW/kg (Calculated) : 59.2  Vital Signs: Temp: 99.1 F (37.3 C) (10/23 2157) Temp Source: Oral (10/23 2157) BP: 153/99 (10/24 0700) Pulse Rate: 114 (10/24 0700)  Labs:  Recent Labs  11/11/16 2217 11/11/16 2240 11/12/16 0037 11/12/16 0436 11/12/16 0818  HGB 11.1* 11.6*  --  11.0*  --   HCT 34.3* 34.0*  --  34.4*  --   PLT 259  --   --  275  --   HEPARINUNFRC  --   --   --   --  <0.10*  CREATININE 4.29* 4.30*  --  4.33*  --   TROPONINI  --   --  1.16* 1.74*  --     Estimated Creatinine Clearance: 11.9 mL/min (A) (by C-G formula based on SCr of 4.33 mg/dL (H)).   Medical History: Past Medical History:  Diagnosis Date  . Acute respiratory failure with hypoxia ([place]) 03/03/2013  . Asthma   . CAD (coronary artery disease)- MI in '09 02/28/2013  . Cancer ([place])    lymphoma  1979    . CHF (congestive heart failure) ([place])    2015  . Chronic renal insufficiency    creatinine ranges around 2.8  . Complication of anesthesia    No anesthesia complications, but concerns about neck positioning due to cervical disease  . DOE (dyspnea on exertion)   . Dyspnea    upon exertion  . Gout   . HCAP (healthcare-associated pneumonia) 03/03/2013  . Herpes zoster 12/2009   left eye ..........last flare up "a long time"  . History of BPH   . HOH (hard of hearing)    hears well out of left ear  . Hypertension   . IHD (ischemic heart disease)    Remote MI in 2009 with late presentation; no  reperfusion. Has large area of infarct in the apical anterior area on last nuclear in 2011. Managed medically  . Influenza A 03/03/2013  . MI, old 03/2007   ACUTE ANTEROSEPTAL  . NSTEMI (non-ST elevated myocardial infarction)- type 2 02/28/2013  . OA (osteoarthritis) of knee    Left knee with revision and replacement of prosthetic L TKR  . Peripheral edema   . RBBB (right bundle branch block)   . Renal insufficiency   . Staph infection    states that gentamycin & vancomycin, (30 - 45 days worth) are what did his kidneys in, per his daughter   Assessment: 83 y/o M here with chest pain and shortness of breath. Mildly elevated troponin. On IV heparin for ACS. HL this AM is undetectable. No issues with infusion or bleeding. H/H stable, Plt wnl.   Goal of Therapy:  Heparin level 0.3-0.7 units/ml Monitor platelets by anticoagulation protocol: Yes   Plan:  Heparin 2000 units BOLUS Increase heparin drip to 1050 units/hr F/u 8 hr HL Daily CBC/HL Monitor for bleeding  Albertina Parr, PharmD., BCPS Clinical Pharmacist Pager [phone]

## 2016-11-12 NOTE — Progress Notes (Signed)
Patient seen and evaluated earlier this am by my associate. Please refer to H and P for details regarding history, assessment, and plan.   Cardiology consulted who will assist with further evaluation and recommendations.  Gen: pt in nad, alert and awake CV: s1 and s2 wnl Pulm: no increased wob, no wheezes  Will plan on reassessing next am.  Velvet Bathe

## 2016-11-12 NOTE — Progress Notes (Signed)
ANTICOAGULATION CONSULT NOTE - Follow Up Consult  Pharmacy Consult for Heparin  Indication: chest pain/ACS  Allergies  Allergen Reactions  . Gentamycin [Gentamicin] Other (See Comments)    Decreased kidney fx  . Crestor [Rosuvastatin Calcium] Other (See Comments)    MUSCLE PAIN  . Hctz [Hydrochlorothiazide] Other (See Comments)    dizziness  . Lipitor [Atorvastatin Calcium] Other (See Comments)    MUSCLE PAIN   Patient Measurements: Height: 5\' 4"  (162.6 cm) Weight: 180 lb 12.8 oz (82 kg) IBW/kg (Calculated) : 59.2  Vital Signs: Temp: 98.2 F (36.8 C) (10/24 1523) Temp Source: Oral (10/24 1523) BP: 143/93 (10/24 1523) Pulse Rate: 97 (10/24 1523)  Labs:  Recent Labs  11/11/16 2217 11/11/16 2240 11/12/16 0037 11/12/16 0436 11/12/16 0818 11/12/16 1559  HGB 11.1* 11.6*  --  11.0*  --   --   HCT 34.3* 34.0*  --  34.4*  --   --   PLT 259  --   --  275  --   --   HEPARINUNFRC  --   --   --   --  <0.10* 0.60  CREATININE 4.29* 4.30*  --  4.33*  --   --   TROPONINI  --   --  1.16* 1.74*  --   --     Estimated Creatinine Clearance: 12 mL/min (A) (by C-G formula based on SCr of 4.33 mg/dL (H)).  Medications: Heparin @ 1050 units/hr  Assessment: 87yom continues on heparin for chest pain with elevated troponin. Heparin level is now therapeutic at 0.6, which is a big increase from undetectable this morning. Could be bolus effect but level drawn 2 hours early so could also still be accumulating - will decrease rate. No bleeding.  Goal of Therapy:  Heparin level 0.3-0.7 units/ml Monitor platelets by anticoagulation protocol: Yes   Plan:  1) Decrease heparin to 1000 units/hr 2) Check 8 hour heparin level  Nena Jordan, PharmD., BCPS 11/12/2016, 5:14 PM

## 2016-11-12 NOTE — Progress Notes (Signed)
Pharmacy Antibiotic Note  [name] is a 83 y.o. male admitted on 11/11/2016 with pneumonia.  Pharmacy has been consulted for vancomycin and zosyn dosing. He has CKD stage 5 with PD catheter in place but has not yet started PD. Cr 4.33 and CrCl 12 ml/min.  Vancomycin trough 15-20  Plan: 1) Vancomycin 1500mg  IV x 1 then 1000mg  IV q48 2) Zosyn 2.25g IV q8 3) Follow renal plans, cultures, LOT, level if needed  Height: 5\' 4"  (162.6 cm) Weight: 180 lb 12.8 oz (82 kg) IBW/kg (Calculated) : 59.2  Temp (24hrs), Avg:98.7 F (37.1 C), Min:98.2 F (36.8 C), Max:99.1 F (37.3 C)   Recent Labs Lab 11/11/16 2217 11/11/16 2240 11/12/16 0436  WBC 16.7*  --  15.0*  CREATININE 4.29* 4.30* 4.33*    Estimated Creatinine Clearance: 12 mL/min (A) (by C-G formula based on SCr of 4.33 mg/dL (H)).    Allergies  Allergen Reactions  . Gentamycin [Gentamicin] Other (See Comments)    Decreased kidney fx  . Crestor [Rosuvastatin Calcium] Other (See Comments)    MUSCLE PAIN  . Hctz [Hydrochlorothiazide] Other (See Comments)    dizziness  . Lipitor [Atorvastatin Calcium] Other (See Comments)    MUSCLE PAIN    Antimicrobials this admission: 10/24 Vancomycin >> 10/24 Zosyn >>  Dose adjustments this admission: n/a  Microbiology results: 10/24 blood x2 >> 10/24 sputum >>  Thank you for allowing pharmacy to be a part of this patient's care.  Deboraha Sprang 11/12/2016 5:40 PM

## 2016-11-12 NOTE — ED Notes (Signed)
Report attempted to 4E, unit stated bed taken away due to staffing. Patient and family notified. Still waiting for hospital bed, family upset about the continued wait in the ED. Pt denies cp presently

## 2016-11-12 NOTE — Consult Note (Signed)
Cardiology Consult    Patient ID: [name]; [id number]; October 15, 1928   Admit date: 11/11/2016 Date of Consult: 11/12/2016  Primary Care Provider: Celene Squibb, MD Primary Cardiologist: Dr. Martinique  Patient Profile    [name] is a 83 y.o. male with past medical history of CAD (s/p MI in 2009 with occlusion of the LAD --> medically treated), chronic combined systolic and diastolic CHF, ischemic cardiomyopathy, Stage 5 CKD, and HTN who is being seen today for the evaluation of elevated troponin at the request of Dr. Wendee Beavers.   History of Present Illness    [name] was recently admitted to [facility] from 10/11 - 11/03/2016 for placement of a PD catheter. Cardiology was consulted during admission as he developed dizziness, diplopia, and bradycardia the morning following his procedure. His Metoprolol Tartrate was held and he was switched to Metoprolol Succinate in the setting of his cardiomyopathy. Hydralazine was also reduced to 25mg  TID secondary to hypotension. A CT Head was obtained and showed no acute intracranial abnormalities. In looking at his discharge summary, Hydralazine and Imdur were discontinued at the time of discharge and he was not switched to Toprol-XL, as his Tartrate dosing was just reduced to 12.5mg  BID.   He presented back to [facility] ED on 11/11/2016 for evaluation of dyspnea and chest pain. Most history is provided by the patient's wife and daughters for he answers yes/no to questions but does not elaborate. Since returning home from his recent discharge they report he has been very weak, requiring 2 assists with transfers. Over the past two days, they note he has been experiencing altered mental status and is not fully alert. Yesterday, he reported feeling significant more short of breath and having tightness along his chest. His wife reports they held his Lasix for several days due to his increased urinary frequeny and being unable to walk to the restroom. He  does not weigh regularly but weight is at 180 lbs today, previously 177 lbs at the time of his recent admission.   Initial labs show WBC of 16.7, Hgb 11.1, platelets 259, Na+ 142, K+ 4.5, and creatinine 4.29. BNP 2178. Initial troponin 1.16 with repeat of 1.74. CXR showing cardiomegaly with vascular congestion. EKG shows sinus tachycardia, HR 111, with RBBB and LAFB (similar to prior tracings).   He has been continued on PO Lasix 40mg  daily and reports some urine output. Still having dyspnea and AMS (able to say his name and knows he is in the hospital). Denies any chest discomfort at this current time.    Past Medical History   Past Medical History:  Diagnosis Date  . Acute respiratory failure with hypoxia ([place]) 03/03/2013  . Asthma   . CAD (coronary artery disease)- MI in '09 02/28/2013  . Cancer ([place])    lymphoma  1979    . CHF (congestive heart failure) ([place])    2015  . Chronic renal insufficiency    creatinine ranges around 2.8  . Complication of anesthesia    No anesthesia complications, but concerns about neck positioning due to cervical disease  . DOE (dyspnea on exertion)   . Dyspnea    upon exertion  . Gout   . HCAP (healthcare-associated pneumonia) 03/03/2013  . Herpes zoster 12/2009   left eye ..........last flare up "a long time"  . History of BPH   . HOH (hard of hearing)    hears well out of left ear  . Hypertension   . IHD (  ischemic heart disease)    Remote MI in 2009 with late presentation; no reperfusion. Has large area of infarct in the apical anterior area on last nuclear in 2011. Managed medically  . Influenza A 03/03/2013  . MI, old 03/2007   ACUTE ANTEROSEPTAL  . NSTEMI (non-ST elevated myocardial infarction)- type 2 02/28/2013  . OA (osteoarthritis) of knee    Left knee with revision and replacement of prosthetic L TKR  . Peripheral edema   . RBBB (right bundle branch block)   . Renal insufficiency   . Staph infection    states that gentamycin &  vancomycin, (30 - 45 days worth) are what did his kidneys in, per his daughter     Allergies:   Allergies  Allergen Reactions  . Gentamycin [Gentamicin] Other (See Comments)    Decreased kidney fx  . Crestor [Rosuvastatin Calcium] Other (See Comments)    MUSCLE PAIN  . Hctz [Hydrochlorothiazide] Other (See Comments)    dizziness  . Lipitor [Atorvastatin Calcium] Other (See Comments)    MUSCLE PAIN    Home Medications:   Home Medications:  Prior to Admission medications   Medication Sig Start Date End Date Taking? Authorizing Provider  albuterol (PROVENTIL) (2.5 MG/3ML) 0.083% nebulizer solution INHALE EVERY 4-6HRS AS NEEDED FOR WHEEZING/SHORTNESS OF BREATH 10/01/16  Yes [provider]  allopurinol (ZYLOPRIM) 100 MG tablet Take one half tablet by mouth once daily. ADDITIONAL REFILLS FROM PCP Patient taking differently: Take 50 mg by mouth daily.  02/05/15  Yes Darlin Coco, MD  aspirin 81 MG tablet Take 81 mg by mouth every other day.     Yes [provider]  clopidogrel (PLAVIX) 75 MG tablet TAKE 1 TABLET BY MOUTH EVERY DAY Patient taking differently: TAKE 75 MG BY MOUTH EVERY DAY 09/08/16  Yes Martinique, Peter M, MD  furosemide (LASIX) 80 MG tablet Take 40 mg by mouth daily.    Yes [provider]  metoprolol tartrate (LOPRESSOR) 25 MG tablet Take 0.5 tablets (12.5 mg total) by mouth 2 (two) times daily. 11/02/16  Yes Rayburn, Floyce Stakes, PA-C  nitroGLYCERIN (NITROSTAT) 0.4 MG SL tablet Place 1 tablet (0.4 mg total) under the tongue every 5 (five) minutes as needed for chest pain (chest pain). 05/02/16  Yes Martinique, Peter M, MD  Propylene Glycol-Glycerin (SOOTHE OP) Place 1 drop into both eyes as needed (for dry eyes).   Yes [provider]  traMADol (ULTRAM) 50 MG tablet Take 1 tablet (50 mg total) by mouth every 6 (six) hours as needed for moderate pain or severe pain. 10/30/16  Yes Michael Boston, MD  triamcinolone ointment (KENALOG) 0.1 % Apply 1  application topically daily as needed (for itchy skin).  10/17/14  Yes [provider]    Inpatient Medications    Scheduled Meds: . allopurinol  50 mg Oral Daily  . aspirin  81 mg Oral QODAY  . clopidogrel  75 mg Oral Daily  . furosemide  80 mg Intravenous BID  . insulin aspart  0-5 Units Subcutaneous QHS  . insulin aspart  0-9 Units Subcutaneous TID WC  . metoprolol succinate  25 mg Oral Daily   Continuous Infusions: . heparin 1,050 Units/hr (11/12/16 0949)  . nitroGLYCERIN     PRN Meds: acetaminophen, albuterol, morphine injection, ondansetron (ZOFRAN) IV, polyvinyl alcohol, traMADol  Family History    Family History  Problem Relation Age of Onset  . Coronary artery disease Mother   . Emphysema Father  never smoked, worked as a Technical sales engineer History    Social History   Social History  . Marital status: Married    Spouse name: N/A  . Number of children: N/A  . Years of education: N/A   Occupational History  . Retired [place] Topics  . Smoking status: Never Smoker  . Smokeless tobacco: Never Used  . Alcohol use No  . Drug use: No  . Sexual activity: Not on file   Other Topics Concern  . Not on file   Social History Narrative  . No narrative on file     Review of Systems    General:  No chills, fever, night sweats or weight changes.  Cardiovascular:  No orthopnea, palpitations, paroxysmal nocturnal dyspnea. Positive for chest pain, dyspnea, and edema.  Dermatological: No rash, lesions/masses Respiratory: No cough, dyspnea Urologic: No hematuria, dysuria Abdominal:   No nausea, vomiting, diarrhea, bright red blood per rectum, melena, or hematemesis Neurologic:  No visual changes, wkns, changes in mental status. All other systems reviewed and are otherwise negative except as noted above.  Physical Exam/Data    Blood pressure (!) 143/93, pulse 97, temperature 98.2 F (36.8 C), temperature source Oral, resp.  rate 18, height 5\' 4"  (1.626 m), weight 180 lb 12.8 oz (82 kg), SpO2 100 %.  General: Pleasant, elderly Caucasian male appearing in NAD Psych: Normal affect. Neuro: Alert and oriented X 2 (person, place). Moves all extremities spontaneously. HEENT: Normal  Neck: Supple without bruits. JVD at 9cm. Lungs:  Resp regular and unlabored, rales along bases bilaterally. Heart: RRR no s3, s4, or murmurs. Abdomen: Soft, non-tender, non-distended, BS + x 4. PD catheter in place.  Extremities: No clubbing or cyanosis. Trace lower extremity edema bilatrally. DP/PT/Radials 1+ and equal bilaterally.   EKG:  The EKG was personally reviewed and demonstrates:  Sinus tachycardia, HR 111, with RBBB and LAFB (similar to prior tracings).    Labs/Studies     Relevant CV Studies:  Echocardiogram: 02/2013 Study Conclusions  - Left ventricle: The cavity size was normal. Wall thickness was normal. Systolic function was mildly to moderately reduced. The estimated ejection fraction was in the range of 40% to 45%. Dyskinesis and aneurysmal deformity of the inferior and septal segments of the apex; consistent with infarction in the distribution of the left anterior descending coronary artery. Moderate hypokinesis of the mid-distalanteroseptal and anterior myocardium. There was a possible, flat (mural), fixedthrombus. - Pulmonary arteries: Systolic pressure was mildly increased. PA peak pressure: 68mm Hg (S).  Laboratory Data:  Chemistry  Recent Labs Lab 11/11/16 2217 11/11/16 2240 11/12/16 0436  NA 142 143 144  K 4.5 4.5 4.8  CL 107 106 105  CO2 22  --  24  GLUCOSE 202* 200* 116*  BUN 87* 78* 90*  CREATININE 4.29* 4.30* 4.33*  CALCIUM 9.6  --  9.8  GFRNONAA 11*  --  11*  GFRAA 13*  --  13*  ANIONGAP 13  --  15    No results for input(s): PROT, ALBUMIN, AST, ALT, ALKPHOS, BILITOT in the last 168 hours. Hematology  Recent Labs Lab 11/11/16 2217 11/11/16 2240  11/12/16 0436  WBC 16.7*  --  15.0*  RBC 3.54*  --  3.56*  HGB 11.1* 11.6* 11.0*  HCT 34.3* 34.0* 34.4*  MCV 96.9  --  96.6  MCH 31.4  --  30.9  MCHC 32.4  --  32.0  RDW 15.5  --  15.6*  PLT 259  --  275   Cardiac Enzymes  Recent Labs Lab 11/12/16 0037 11/12/16 0436  TROPONINI 1.16* 1.74*     Recent Labs Lab 11/11/16 2233  TROPIPOC 0.62*    BNP  Recent Labs Lab 11/11/16 2239  BNP 2,178.5*    DDimer No results for input(s): DDIMER in the last 168 hours.  Radiology/Studies:  Dg Chest 2 View  Result Date: 11/11/2016 CLINICAL DATA:  83 year old male with chest pain.  History of MI. EXAM: CHEST  2 VIEW COMPARISON:  Chest radiograph dated 10/31/2016 FINDINGS: Cardiac monitor leads overlie the patient. There is stable cardiomegaly. There is vascular congestion, new or worsened compared to the prior radiograph. Superimposed pneumonia is not excluded. Clinical correlation is recommended. No large pleural effusion. There is no pneumothorax. A small stent noted over the right hemidiaphragm as seen on the prior radiograph. There is osteopenia with extensive degenerative changes of the spine. There are degenerative changes of the shoulder with flattening of the right humeral head. Cervical anterior fusion plate and screw as well as lower thoracic and lumbar posterior fixation hardware noted. IMPRESSION: 1. Cardiomegaly with vascular congestion. Pneumonia is not excluded. Clinical correlation is recommended. 2. Extensive degenerative changes of the spine and shoulders as well as lower thoracic and lumbar posterior fixation hardware. Electronically Signed   By: Anner Crete M.D.   On: 11/11/2016 22:59     Assessment & Plan    1. Elevated Troponin/ CAD - the patient has known CAD having a total occlusion of the LAD which was medically treated in the past. He has been medically managed since due to his Stage 5 CKD and the risk of contrast-induced nephropathy.  - he presents with an  array of symptoms including AMS, deconditioning, chest pressure and worsening dyspnea. His wife held his Lasix for several days due to him being unable to walk to the restroom.  - initial labs show BNP of 2178 and initial troponin 1.16 with repeat of 1.74. EKG shows sinus tachycardia, HR 111, with RBBB and LAFB (similar to prior tracings).  - would initially diurese the patient as his symptoms could be secondary to volume overload.  - continue ASA, Plavix, and BB therapy. Remains on Imdur and Heparin. If symptoms recur, would need to touch base with Nephrology to see when he could initiate PD prior to proceeding with further ischemic evaluation.   2. Acute on Chronic Combined Systolic and Diastolic CHF - echo in 09/9831 showed a reduced EF of 40-45%. He presents with worsening dyspnea and chest pressure. BNP 2178 with CXR showing cardiomegaly with vascular congestion.  - currently receiving PO Lasix. Will switch to IV Lasix 80mg  BID. Monitor I&O's along with daily weights.  - will repeat an echocardiogram to assess LV function and wall motion. Switch from Metoprolol Tartrate to Succinate in the setting of his cardiomyopathy. Anticipate re-initiation of Hydralazine/Imdur prior to discharge if BP allows.   3. HTN - BP at 119/72 - 163/109 within the past 24 hours.  - will stop Metoprolol Tartrate and switch to Succinate 25mg  daily. Anticipate restarting Hydralazine/Imdur prior to discharge. Currently on IV NTG.   4. Stage 5 CKD - recently underwent PD catheter placement earlier this month but has not yet started PD. Would recommend Nephrology consult as he was suppose to have his catheter flushed and examined tomorrow.   5. AMS - patient's family reports he is less alert than normal and having examined the patient just two weeks ago, I  agree with this assessment.  - BUN at 90. Will check Ammonia level.   6. Deconditioning - patient was previously independent but has required 2 assists with  transfer since hospital discharge on 10/14. - will consult PT.     Signed, Erma Heritage, PA-C 11/12/2016, 4:57 PM Pager: [phone]

## 2016-11-13 ENCOUNTER — Inpatient Hospital Stay (HOSPITAL_COMMUNITY): Payer: Medicare HMO

## 2016-11-13 ENCOUNTER — Other Ambulatory Visit (HOSPITAL_COMMUNITY): Payer: Medicare HMO

## 2016-11-13 DIAGNOSIS — I340 Nonrheumatic mitral (valve) insufficiency: Secondary | ICD-10-CM

## 2016-11-13 LAB — BLOOD GAS, ARTERIAL
ACID-BASE DEFICIT: 1.5 mmol/L (ref 0.0–2.0)
Bicarbonate: 23.5 mmol/L (ref 20.0–28.0)
Delivery systems: POSITIVE
Drawn by: 317771
EXPIRATORY PAP: 6
FIO2: 40
Inspiratory PAP: 12
LHR: 8 {breaths}/min
O2 SAT: 98.7 %
PCO2 ART: 45.2 mmHg (ref 32.0–48.0)
PH ART: 7.336 — AB (ref 7.350–7.450)
Patient temperature: 98.6
pO2, Arterial: 135 mmHg — ABNORMAL HIGH (ref 83.0–108.0)

## 2016-11-13 LAB — GLUCOSE, CAPILLARY
GLUCOSE-CAPILLARY: 151 mg/dL — AB (ref 65–99)
Glucose-Capillary: 136 mg/dL — ABNORMAL HIGH (ref 65–99)
Glucose-Capillary: 142 mg/dL — ABNORMAL HIGH (ref 65–99)
Glucose-Capillary: 148 mg/dL — ABNORMAL HIGH (ref 65–99)
Glucose-Capillary: 161 mg/dL — ABNORMAL HIGH (ref 65–99)
Glucose-Capillary: 191 mg/dL — ABNORMAL HIGH (ref 65–99)

## 2016-11-13 LAB — CBC
HEMATOCRIT: 29.9 % — AB (ref 39.0–52.0)
HEMOGLOBIN: 9.6 g/dL — AB (ref 13.0–17.0)
MCH: 31.3 pg (ref 26.0–34.0)
MCHC: 32.1 g/dL (ref 30.0–36.0)
MCV: 97.4 fL (ref 78.0–100.0)
Platelets: 293 10*3/uL (ref 150–400)
RBC: 3.07 MIL/uL — ABNORMAL LOW (ref 4.22–5.81)
RDW: 15.7 % — ABNORMAL HIGH (ref 11.5–15.5)
WBC: 18.6 10*3/uL — ABNORMAL HIGH (ref 4.0–10.5)

## 2016-11-13 LAB — IRON AND TIBC
Iron: 40 ug/dL — ABNORMAL LOW (ref 45–182)
SATURATION RATIOS: 19 % (ref 17.9–39.5)
TIBC: 210 ug/dL — AB (ref 250–450)
UIBC: 170 ug/dL

## 2016-11-13 LAB — ECHOCARDIOGRAM COMPLETE
Height: 64 in
Weight: 2886.4 oz

## 2016-11-13 LAB — HEPARIN LEVEL (UNFRACTIONATED)
HEPARIN UNFRACTIONATED: 0.64 [IU]/mL (ref 0.30–0.70)
Heparin Unfractionated: 0.68 IU/mL (ref 0.30–0.70)

## 2016-11-13 LAB — BASIC METABOLIC PANEL
ANION GAP: 15 (ref 5–15)
BUN: 103 mg/dL — ABNORMAL HIGH (ref 6–20)
CO2: 25 mmol/L (ref 22–32)
Calcium: 9.3 mg/dL (ref 8.9–10.3)
Chloride: 104 mmol/L (ref 101–111)
Creatinine, Ser: 5.09 mg/dL — ABNORMAL HIGH (ref 0.61–1.24)
GFR calc Af Amer: 11 mL/min — ABNORMAL LOW (ref >60)
GFR, EST NON AFRICAN AMERICAN: 9 mL/min — AB (ref >60)
GLUCOSE: 146 mg/dL — AB (ref 65–99)
POTASSIUM: 4.7 mmol/L (ref 3.5–5.1)
Sodium: 144 mmol/L (ref 135–145)

## 2016-11-13 LAB — AMMONIA: AMMONIA: 50 umol/L — AB (ref 9–35)

## 2016-11-13 LAB — PHOSPHORUS: Phosphorus: 5.4 mg/dL — ABNORMAL HIGH (ref 2.5–4.6)

## 2016-11-13 LAB — PROCALCITONIN: Procalcitonin: 0.25 ng/mL

## 2016-11-13 LAB — HEMOGLOBIN A1C
Hgb A1c MFr Bld: 6 % — ABNORMAL HIGH (ref 4.8–5.6)
MEAN PLASMA GLUCOSE: 125.5 mg/dL

## 2016-11-13 MED ORDER — AMIODARONE HCL IN DEXTROSE 360-4.14 MG/200ML-% IV SOLN
30.0000 mg/h | INTRAVENOUS | Status: DC
  Administered 2016-11-14 – 2016-11-15 (×2): 30 mg/h via INTRAVENOUS
  Administered 2016-11-15: 16.7 mg/h via INTRAVENOUS
  Administered 2016-11-16 – 2016-11-18 (×6): 30 mg/h via INTRAVENOUS
  Filled 2016-11-13 (×10): qty 200

## 2016-11-13 MED ORDER — HEPARIN 1000 UNIT/ML FOR PERITONEAL DIALYSIS: INTRAPERITONEAL | Status: DC | PRN

## 2016-11-13 MED ORDER — FUROSEMIDE 10 MG/ML IJ SOLN
40.0000 mg | Freq: Once | INTRAMUSCULAR | Status: AC
  Administered 2016-11-13: 40 mg via INTRAVENOUS

## 2016-11-13 MED ORDER — AMIODARONE HCL IN DEXTROSE 360-4.14 MG/200ML-% IV SOLN
60.0000 mg/h | INTRAVENOUS | Status: AC
  Administered 2016-11-14 (×2): 60 mg/h via INTRAVENOUS
  Filled 2016-11-13 (×2): qty 200

## 2016-11-13 MED ORDER — HEPARIN 1000 UNIT/ML FOR PERITONEAL DIALYSIS: 500.0000 [IU] | INTRAMUSCULAR | Status: DC | PRN

## 2016-11-13 MED ORDER — DELFLEX-LC/2.5% DEXTROSE 394 MOSM/L IP SOLN
Freq: Four times a day (QID) | INTRAPERITONEAL | Status: DC
  Administered 2016-11-14: 2000 mL via INTRAPERITONEAL

## 2016-11-13 MED ORDER — AMIODARONE LOAD VIA INFUSION
150.0000 mg | Freq: Once | INTRAVENOUS | Status: AC
  Administered 2016-11-13: 150 mg via INTRAVENOUS
  Filled 2016-11-13: qty 83.34

## 2016-11-13 MED ORDER — MUPIROCIN 2 % EX OINT
TOPICAL_OINTMENT | Freq: Two times a day (BID) | CUTANEOUS | Status: DC
  Administered 2016-11-14: 09:00:00 via NASAL
  Administered 2016-11-14: 1 via NASAL
  Administered 2016-11-16 – 2016-11-20 (×6): via NASAL
  Filled 2016-11-13 (×6): qty 22

## 2016-11-13 MED ORDER — FUROSEMIDE 10 MG/ML IJ SOLN
40.0000 mg | Freq: Once | INTRAMUSCULAR | Status: AC
  Administered 2016-11-13: 40 mg via INTRAVENOUS
  Filled 2016-11-13: qty 4

## 2016-11-13 MED ORDER — FUROSEMIDE 10 MG/ML IJ SOLN
80.0000 mg | Freq: Three times a day (TID) | INTRAMUSCULAR | Status: DC
  Administered 2016-11-13 – 2016-11-15 (×6): 80 mg via INTRAVENOUS
  Filled 2016-11-13 (×6): qty 8

## 2016-11-13 NOTE — Progress Notes (Signed)
MD Radford Pax was called for orders for patient being in a-fib RVR- told to give amiodarone and start gtt. Patient, however, converted back to ST before gtt begun. MD has been notified and on-coming RN and charge nurse are aware.

## 2016-11-13 NOTE — Progress Notes (Signed)
ANTICOAGULATION CONSULT NOTE - Follow Up Consult  Pharmacy Consult for heparin Indication: chest pain/ACS   Labs:  Recent Labs  11/11/16 2217 11/11/16 2240 11/12/16 0037 11/12/16 0436 11/12/16 0818 11/12/16 1559 11/12/16 2328  HGB 11.1* 11.6*  --  11.0*  --   --   --   HCT 34.3* 34.0*  --  34.4*  --   --   --   PLT 259  --   --  275  --   --   --   HEPARINUNFRC  --   --   --   --  <0.10* 0.60 0.64  CREATININE 4.29* 4.30*  --  4.33*  --   --   --   TROPONINI  --   --  1.16* 1.74*  --  1.96*  --     Assessment/Plan:  83yo male therapeutic on heparin after rate change. Will continue gtt at current rate and confirm stable with am labs.   Wynona Neat, PharmD, BCPS  11/13/2016,12:47 AM

## 2016-11-13 NOTE — Progress Notes (Signed)
Progress Note  Patient Name: [name] Date of Encounter: 11/13/2016  Primary Cardiologist: Dr. Martinique   Subjective   Still short of breath.  Feeling tired.  No chest pain.   Inpatient Medications    Scheduled Meds: . allopurinol  50 mg Oral Daily  . aspirin  81 mg Oral QODAY  . clopidogrel  75 mg Oral Daily  . furosemide  80 mg Intravenous BID  . insulin aspart  0-5 Units Subcutaneous QHS  . insulin aspart  0-9 Units Subcutaneous TID WC  . metoprolol succinate  25 mg Oral Daily   Continuous Infusions: . heparin 1,000 Units/hr (11/13/16 0212)  . nitroGLYCERIN 30 mcg/min (11/12/16 2355)  . piperacillin-tazobactam (ZOSYN)  IV Stopped (11/13/16 0241)  . [START ON 11/14/2016] vancomycin     PRN Meds: acetaminophen, albuterol, morphine injection, ondansetron (ZOFRAN) IV, polyvinyl alcohol, traMADol   Vital Signs    Vitals:   11/13/16 0017 11/13/16 0105 11/13/16 0413 11/13/16 0700  BP: (!) 150/98 (!) 144/93 131/90   Pulse: (!) 123 (!) 125 (!) 115   Resp: (!) 31 (!) 30 (!) 25   Temp: 99.5 F (37.5 C)  99.8 F (37.7 C) 98 F (36.7 C)  TempSrc: Axillary  Axillary Oral  SpO2: 96%  99%   Weight:   81.8 kg (180 lb 6.4 oz)   Height:        Intake/Output Summary (Last 24 hours) at 11/13/16 0857 Last data filed at 11/13/16 0700  Gross per 24 hour  Intake           478.93 ml  Output             1650 ml  Net         -1171.07 ml   Filed Weights   11/11/16 2158 11/12/16 1523 11/13/16 0413  Weight: 79.4 kg (175 lb) 82 kg (180 lb 12.8 oz) 81.8 kg (180 lb 6.4 oz)    Telemetry    Sinus tachycardia.  Rate 100s-130s.- Personally Reviewed  ECG    N/a - Personally Reviewed  Physical Exam   VS:  BP 131/90 (BP Location: Left Wrist)   Pulse (!) 115   Temp 98 F (36.7 C) (Oral)   Resp (!) 25   Ht 5\' 4"  (1.626 m)   Wt 81.8 kg (180 lb 6.4 oz)   SpO2 99%   BMI 30.97 kg/m  , BMI Body mass index is 30.97 kg/m. GENERAL: Chronically ill-appearing elderly man in no  acute distress. HEENT: Pupils equal round and reactive, fundi not visualized, oral mucosa unremarkable NECK:  +JVD.  waveform within normal limits, carotid upstroke brisk and symmetric, no bruits, no thyromegaly LUNGS:  Diminished at bases.  HEART:  RRR.  PMI not displaced or sustained,S1 and S2 within normal limits, no S3, no S4, no clicks, no rubs, no murmurs ABD:  Flat, positive bowel sounds normal in frequency in pitch, no bruits, no rebound, no guarding, no midline pulsatile mass, no hepatomegaly, no splenomegaly.  PD catheter in place EXT:  2 plus pulses throughout, 1+ LE edema, no cyanosis no clubbing SKIN:  No rashes no nodules NEURO:  Cranial nerves II through XII grossly intact, motor grossly intact throughout [facility]:  Cognitively intact, oriented to person place and time   Labs    Chemistry Recent Labs Lab 11/11/16 2217 11/11/16 2240 11/12/16 0436  NA 142 143 144  K 4.5 4.5 4.8  CL 107 106 105  CO2 22  --  24  GLUCOSE 202* 200* 116*  BUN 87* 78* 90*  CREATININE 4.29* 4.30* 4.33*  CALCIUM 9.6  --  9.8  GFRNONAA 11*  --  11*  GFRAA 13*  --  13*  ANIONGAP 13  --  15     Hematology Recent Labs Lab 11/11/16 2217 11/11/16 2240 11/12/16 0436  WBC 16.7*  --  15.0*  RBC 3.54*  --  3.56*  HGB 11.1* 11.6* 11.0*  HCT 34.3* 34.0* 34.4*  MCV 96.9  --  96.6  MCH 31.4  --  30.9  MCHC 32.4  --  32.0  RDW 15.5  --  15.6*  PLT 259  --  275    Cardiac Enzymes Recent Labs Lab 11/12/16 0037 11/12/16 0436 11/12/16 1559  TROPONINI 1.16* 1.74* 1.96*    Recent Labs Lab 11/11/16 2233  TROPIPOC 0.62*     BNP Recent Labs Lab 11/11/16 2239  BNP 2,178.5*     DDimer No results for input(s): DDIMER in the last 168 hours.   Radiology    Dg Chest 2 View  Result Date: 11/11/2016 CLINICAL DATA:  83 year old male with chest pain.  History of MI. EXAM: CHEST  2 VIEW COMPARISON:  Chest radiograph dated 10/31/2016 FINDINGS: Cardiac monitor leads overlie the patient.  There is stable cardiomegaly. There is vascular congestion, new or worsened compared to the prior radiograph. Superimposed pneumonia is not excluded. Clinical correlation is recommended. No large pleural effusion. There is no pneumothorax. A small stent noted over the right hemidiaphragm as seen on the prior radiograph. There is osteopenia with extensive degenerative changes of the spine. There are degenerative changes of the shoulder with flattening of the right humeral head. Cervical anterior fusion plate and screw as well as lower thoracic and lumbar posterior fixation hardware noted. IMPRESSION: 1. Cardiomegaly with vascular congestion. Pneumonia is not excluded. Clinical correlation is recommended. 2. Extensive degenerative changes of the spine and shoulders as well as lower thoracic and lumbar posterior fixation hardware. Electronically Signed   By: Anner Crete M.D.   On: 11/11/2016 22:59   Dg Chest Port 1 View  Result Date: 11/13/2016 CLINICAL DATA:  Acute onset of shortness of breath. Initial encounter. EXAM: PORTABLE CHEST 1 VIEW COMPARISON:  Chest radiograph performed 11/11/2016 FINDINGS: The lungs are well-aerated. Vascular congestion is noted. Right perihilar and bibasilar airspace opacities raise concern for pulmonary edema. Small bilateral pleural effusions are noted. There is no evidence of pneumothorax. The cardiomediastinal silhouette is borderline normal in size. No acute osseous abnormalities are seen. Cervical and thoracolumbar spinal fusion hardware noted. Degenerative change is noted at both glenohumeral joints. IMPRESSION: Vascular congestion. Right perihilar and bibasilar airspace opacities raise concern for pulmonary edema. Small bilateral pleural effusions noted. Electronically Signed   By: Garald Balding M.D.   On: 11/13/2016 01:36    Cardiac Studies   Echo pending  Patient Profile     Mr. [name] is an 83M with CAD s/p medically managed STEMI, and CKD V here with acute on  chronic systolic and diastolic heart failure and possible HCAP.  Assessment & Plan    # Acute on chronic systolic and diastolic heart failure: Mr. [name] is still volume overloaded.  He is net -800 mL after 1 dose of Lasix 80 mg IV.  We will increase his to 80 mg every 8 hours.  Echocardiogram pending.  # NSTEMI: Troponin is elevated to 1.96.  He will need a left heart catheterization at some point.  Ideally this will be after his  PD catheter has matured.  He is currently chest pain-free.  # Leukocytosis: Continue Vanc/Zosyn for now.  His initial chest x-ray was concerning for pneumonia and he has a leukocytosis.  Blood cultures and sputum culture are pending.  We will defer to the primary team whether to continue these antibiotics.  # CDK V: Lasix as above.  PD catheter maturing.   For questions or updates, please contact [place] Please consult [email or web address] for contact info under Cardiology/STEMI.      Signed, Skeet Latch, MD  11/13/2016, 8:57 AM

## 2016-11-13 NOTE — Progress Notes (Signed)
ANTICOAGULATION CONSULT NOTE - Follow Up Consult  Pharmacy Consult for Heparin Indication: chest pain/ACS  Allergies  Allergen Reactions  . Gentamycin [Gentamicin] Other (See Comments)    Decreased kidney fx  . Crestor [Rosuvastatin Calcium] Other (See Comments)    MUSCLE PAIN  . Hctz [Hydrochlorothiazide] Other (See Comments)    dizziness  . Lipitor [Atorvastatin Calcium] Other (See Comments)    MUSCLE PAIN    Patient Measurements: Height: 5\' 4"  (162.6 cm) Weight: 180 lb 6.4 oz (81.8 kg) IBW/kg (Calculated) : 59.2 Heparin Dosing Weight:  76.4 kg  Vital Signs: Temp: 97.8 F (36.6 C) (10/25 1100) Temp Source: Oral (10/25 1100) BP: 131/90 (10/25 0413) Pulse Rate: 115 (10/25 0413)  Labs:  Recent Labs  11/11/16 2217 11/11/16 2240 11/12/16 0037 11/12/16 0436  11/12/16 1559 11/12/16 2328 11/13/16 0855 11/13/16 1133  HGB 11.1* 11.6*  --  11.0*  --   --   --  9.6*  --   HCT 34.3* 34.0*  --  34.4*  --   --   --  29.9*  --   PLT 259  --   --  275  --   --   --  293  --   HEPARINUNFRC  --   --   --   --   < > 0.60 0.64  --  0.68  CREATININE 4.29* 4.30*  --  4.33*  --   --   --  5.09*  --   TROPONINI  --   --  1.16* 1.74*  --  1.96*  --   --   --   < > = values in this interval not displayed.  Estimated Creatinine Clearance: 10.2 mL/min (A) (by C-G formula based on SCr of 5.09 mg/dL (H)).  Assessment:  Anticoag: IV heparin for ACS. HL 0.68 in goal. Hgb 11>9.6 overnight. Plts stable  Goal of Therapy:  Heparin level 0.3-0.7 units/ml Monitor platelets by anticoagulation protocol: Yes   Plan:  IV heparin 1000 units/hr Daily HL, CBC Medical management for now  Echo   [name], PharmD, BCPS Clinical Staff Pharmacist Pager [phone]  [name] 11/13/2016,1:33 PM

## 2016-11-13 NOTE — Evaluation (Signed)
Physical Therapy Evaluation Patient Details Name: [name] MRN: [id number] DOB: 1929/01/17 Today's Date: 11/13/2016   History of Present Illness  83 yo male with onset of CHF exacerbation and leukocytosis was admitted, has elevated troponin and AMS.  Has mult involved family members, hoping to take him home.  PMHx:  asthma, CAD, CKD 5, respiratory failure, lymphoma, R BBB, OA, old NSTEMI's, ESRD, ischemic cardiomyopathy, HTN, hyperglycemia.  Clinical Impression  Pt was seen with family to work on initial mobility and talked with them about having pt enter inpt care for rehab.  The family is not initially receptive to the idea despite the clearly dependent nature of his care.  He is appropriate to continue with PT for strengthening and balance control, but will need to monitor his tolerance and titrate effort accordingly.     Follow Up Recommendations SNF    Equipment Recommendations  None recommended by PT    Recommendations for Other Services       Precautions / Restrictions Precautions Precautions: Fall (telemetry) Precaution Comments: monitor pulses, RR and O2 sats Restrictions Weight Bearing Restrictions: No Other Position/Activity Restrictions: prefers to list to L, restricted ROM to go R      Mobility  Bed Mobility Overal bed mobility: Needs Assistance Bed Mobility: Supine to Sit;Sit to Supine     Supine to sit: Mod assist;Max assist Sit to supine: Max assist;Mod assist   General bed mobility comments: help to roll and sit from side to get up, then help to lift legs to bed to return and reposition  Transfers Overall transfer level: Needs assistance Equipment used: 1 person hand held assist;Rolling walker (2 wheeled) Transfers: Sit to/from Stand Sit to Stand: Max assist;From elevated surface         General transfer comment: unable to assist with walker but PT directly could partially stand  Ambulation/Gait             General Gait Details: unable    Stairs            Wheelchair Mobility    Modified Rankin (Stroke Patients Only)       Balance Overall balance assessment: Needs assistance Sitting-balance support: Feet supported;Single extremity supported Sitting balance-Leahy Scale: Poor Sitting balance - Comments: tends to lean onto L elbow, but cannot shift well to R to support Postural control: Left lateral lean;Posterior lean     Standing balance comment: unable to stand                             Pertinent Vitals/Pain Pain Assessment: Faces Faces Pain Scale: Hurts little more Pain Location: R leg to move to side of bed and to lean to R Pain Intervention(s): Limited activity within patient's tolerance;Monitored during session;Premedicated before session;Repositioned    Home Living Family/patient expects to be discharged to:: Private residence Living Arrangements: Spouse/significant other Available Help at Discharge: Family;Available 24 hours/day Type of Home: House Home Access: Ramped entrance     Home Layout: Other (Comment) (has 2 steps between two rooms, but separate ramps to avoid) Home Equipment: Walker - 2 wheels;Cane - single point;Bedside commode;Shower seat;Hand held shower head;Other (comment);Wheelchair - manual (may need new BSC) Additional Comments: family wants to replace [facility]    Prior Function Level of Independence: Needs assistance   Gait / Transfers Assistance Needed: family has been assisting in and out of wc to car and in house  ADL's / Homemaking Assistance Needed: wife and daughters  assist with all care and house        Hand Dominance        Extremity/Trunk Assessment   Upper Extremity Assessment Upper Extremity Assessment: Generalized weakness    Lower Extremity Assessment Lower Extremity Assessment: Generalized weakness    Cervical / Trunk Assessment Cervical / Trunk Assessment: Kyphotic  Communication   Communication: Expressive difficulties  Cognition  Arousal/Alertness: Lethargic Behavior During Therapy: Flat affect Overall Cognitive Status: History of cognitive impairments - at baseline                                 General Comments: more lethargic than usual but family reports he is limited at baseline      General Comments      Exercises General Exercises - Lower Extremity Ankle Circles/Pumps: Both;AAROM;5 reps Long Arc Quad: AROM;Both;5 reps Heel Slides: AROM;Both;10 reps Hip Flexion/Marching: AAROM;Both;5 reps   Assessment/Plan    PT Assessment Patient needs continued PT services  PT Problem List Decreased strength;Decreased range of motion;Decreased activity tolerance;Decreased balance;Decreased mobility;Decreased coordination;Decreased cognition;Decreased knowledge of use of DME;Decreased safety awareness;Cardiopulmonary status limiting activity;Obesity;Decreased skin integrity;Pain       PT Treatment Interventions DME instruction;Functional mobility training;Therapeutic activities;Therapeutic exercise;Balance training;Neuromuscular re-education;Patient/family education    PT Goals (Current goals can be found in the Care Plan section)  Acute Rehab PT Goals Patient Stated Goal: none stated PT Goal Formulation: With patient/family Time For Goal Achievement: 11/27/16 Potential to Achieve Goals: Fair    Frequency Min 3X/week   Barriers to discharge Inaccessible home environment;Decreased caregiver support (has 2 person assistance needed now) will need 2 person assist to mobilize    Co-evaluation               AM-PAC PT "6 Clicks" Daily Activity  Outcome Measure Difficulty turning over in bed (including adjusting bedclothes, sheets and blankets)?: Unable Difficulty moving from lying on back to sitting on the side of the bed? : Unable Difficulty sitting down on and standing up from a chair with arms (e.g., wheelchair, bedside commode, etc,.)?: Unable Help needed moving to and from a bed to  chair (including a wheelchair)?: Total Help needed walking in hospital room?: Total Help needed climbing 3-5 steps with a railing? : Total 6 Click Score: 6    End of Session Equipment Utilized During Treatment: Oxygen Activity Tolerance: Patient limited by fatigue;Patient limited by lethargy;Other (comment) (RR was over 30 and pulses up to 120 with the effort) Patient left: in bed;with call bell/phone within reach;with bed alarm set;with family/visitor present (daughter is a Marine scientist and helped reposition) Nurse Communication: Mobility status PT Visit Diagnosis: Unsteadiness on feet (R26.81);Muscle weakness (generalized) (M62.81);Difficulty in walking, not elsewhere classified (R26.2);Adult, failure to thrive (R62.7);Pain Pain - Right/Left:  (B) Pain - part of body: Leg    Time: 1402-1430 PT Time Calculation (min) (ACUTE ONLY): 28 min   Charges:   PT Evaluation $PT Eval Moderate Complexity: 1 Mod PT Treatments $Therapeutic Activity: 8-22 mins   PT G Codes:   PT G-Codes **NOT FOR INPATIENT CLASS** Functional Assessment Tool Used: AM-PAC 6 Clicks Basic Mobility    Ramond Dial 11/13/2016, 4:19 PM   Mee Hives, PT MS Acute Rehab Dept. Number: [place]

## 2016-11-13 NOTE — Progress Notes (Signed)
PROGRESS NOTE    [name]  UYQ:[id number] DOB: 06-14-1928 DOA: 11/11/2016 PCP: Celene Squibb, MD    Brief Narrative:  83 y.o. male with medical history significant for medically managed coronary artery disease, chronic combined systolic/diastolic CHF, hypertension, and chronic kidney disease stage IV-V, now presenting to the emergency department for evaluation of chest discomfort and dyspnea.    Assessment & Plan:   Principal Problem:   NSTEMI (non-ST elevated myocardial infarction)- type 2 - Cardiology consulted and managing  Active Problems:   CKD (chronic kidney disease), stage V [facility]) - Nephrology consulted and will manage, question is whether patient's mental status is worsening as a result of worsening renal function. Discussed with daughter.     Acute on chronic systolic and diastolic heart failure, NYHA class 4 (HCC) - pt on metoprolol and lasix.    Leukocytosis - plan will be to continue vancomycin and Zosyn. Concern was that initial chest x-ray reported finding suspicious for pneumonia. Awaiting blood cultures and sputum cultures    Hyperglycemia - will obtain hemoglobin A1c and continue sliding scale insulin  DVT prophylaxis: Pt is on heparin gtt Code Status: Full Family Communication: None at bedside.  Disposition Plan: pending further recommendations by specialist   Consultants:   Nephrology  Cardiology   Procedures: none   Antimicrobials: none   Subjective: Pt has no new complaints currently. Daughter was requesting nephrology consult.   Objective: Vitals:   11/13/16 0105 11/13/16 0413 11/13/16 0700 11/13/16 1100  BP: (!) 144/93 131/90    Pulse: (!) 125 (!) 115    Resp: (!) 30 (!) 25    Temp:  99.8 F (37.7 C) 98 F (36.7 C) 97.8 F (36.6 C)  TempSrc:  Axillary Oral Oral  SpO2:  99%    Weight:  81.8 kg (180 lb 6.4 oz)    Height:        Intake/Output Summary (Last 24 hours) at 11/13/16 1611 Last data filed at 11/13/16 0700  Gross per 24 hour  Intake           403.93 ml  Output             1250 ml  Net          -846.07 ml   Filed Weights   11/11/16 2158 11/12/16 1523 11/13/16 0413  Weight: 79.4 kg (175 lb) 82 kg (180 lb 12.8 oz) 81.8 kg (180 lb 6.4 oz)    Examination:  General exam: Appears calm and comfortable, in nad. Respiratory system: Clear to auscultation. Respiratory effort normal. Cardiovascular system: S1 & S2 heard, RRR. No JVD, murmurs, rubs, gallops or clicks. No pedal edema. Gastrointestinal system: Abdomen is nondistended, soft and nontender. No organomegaly or masses felt. Normal bowel sounds heard. Central nervous system: Alert and oriented. No facial asymmetry Extremities: Symmetric 5 x 5 power. Skin: No rashes, lesions or ulcers, on limited exam. Psychiatry: Mood & affect appropriate.     Data Reviewed: I have personally reviewed following labs and imaging studies  CBC:  Recent Labs Lab 11/11/16 2217 11/11/16 2240 11/12/16 0436 11/13/16 0855  WBC 16.7*  --  15.0* 18.6*  HGB 11.1* 11.6* 11.0* 9.6*  HCT 34.3* 34.0* 34.4* 29.9*  MCV 96.9  --  96.6 97.4  PLT 259  --  275 638   Basic Metabolic Panel:  Recent Labs Lab 11/11/16 2217 11/11/16 2240 11/12/16 0436 11/13/16 0855 11/13/16 1449  NA 142 143 144 144  --   K 4.5 4.5 4.8  4.7  --   CL 107 106 105 104  --   CO2 22  --  24 25  --   GLUCOSE 202* 200* 116* 146*  --   BUN 87* 78* 90* 103*  --   CREATININE 4.29* 4.30* 4.33* 5.09*  --   CALCIUM 9.6  --  9.8 9.3  --   PHOS  --   --   --   --  5.4*   GFR: Estimated Creatinine Clearance: 10.2 mL/min (A) (by C-G formula based on SCr of 5.09 mg/dL (H)). Liver Function Tests: No results for input(s): AST, ALT, ALKPHOS, BILITOT, PROT, ALBUMIN in the last 168 hours. No results for input(s): LIPASE, AMYLASE in the last 168 hours.  Recent Labs Lab 11/13/16 0350  AMMONIA 50*   Coagulation Profile: No results for input(s): INR, PROTIME in the last 168 hours. Cardiac  Enzymes:  Recent Labs Lab 11/12/16 0037 11/12/16 0436 11/12/16 1559  TROPONINI 1.16* 1.74* 1.96*   BNP (last 3 results) No results for input(s): PROBNP in the last 8760 hours. HbA1C: No results for input(s): HGBA1C in the last 72 hours. CBG:  Recent Labs Lab 11/13/16 0016 11/13/16 0425 11/13/16 0805 11/13/16 1128 11/13/16 1538  GLUCAP 136* 142* 148* 151* 161*   Lipid Profile: No results for input(s): CHOL, HDL, LDLCALC, TRIG, CHOLHDL, LDLDIRECT in the last 72 hours. Thyroid Function Tests: No results for input(s): TSH, T4TOTAL, FREET4, T3FREE, THYROIDAB in the last 72 hours. Anemia Panel: No results for input(s): VITAMINB12, FOLATE, FERRITIN, TIBC, IRON, RETICCTPCT in the last 72 hours. Sepsis Labs:  Recent Labs Lab 11/12/16 0037 11/12/16 0436 11/13/16 0350  PROCALCITON 0.22 0.20 0.25    Recent Results (from the past 240 hour(s))  Culture, blood (routine x 2)     Status: None (Preliminary result)   Collection Time: 11/12/16  6:32 PM  Result Value Ref Range Status   Specimen Description BLOOD RIGHT HAND  Final   Special Requests   Final    BOTTLES DRAWN AEROBIC AND ANAEROBIC Blood Culture adequate volume   Culture NO GROWTH < 24 HOURS  Final   Report Status PENDING  Incomplete  Culture, blood (routine x 2)     Status: None (Preliminary result)   Collection Time: 11/12/16  6:42 PM  Result Value Ref Range Status   Specimen Description BLOOD LEFT HAND  Final   Special Requests   Final    BOTTLES DRAWN AEROBIC AND ANAEROBIC Blood Culture adequate volume   Culture NO GROWTH < 24 HOURS  Final   Report Status PENDING  Incomplete     Radiology Studies: Dg Chest 2 View  Result Date: 11/11/2016 CLINICAL DATA:  83 year old male with chest pain.  History of MI. EXAM: CHEST  2 VIEW COMPARISON:  Chest radiograph dated 10/31/2016 FINDINGS: Cardiac monitor leads overlie the patient. There is stable cardiomegaly. There is vascular congestion, new or worsened compared to  the prior radiograph. Superimposed pneumonia is not excluded. Clinical correlation is recommended. No large pleural effusion. There is no pneumothorax. A small stent noted over the right hemidiaphragm as seen on the prior radiograph. There is osteopenia with extensive degenerative changes of the spine. There are degenerative changes of the shoulder with flattening of the right humeral head. Cervical anterior fusion plate and screw as well as lower thoracic and lumbar posterior fixation hardware noted. IMPRESSION: 1. Cardiomegaly with vascular congestion. Pneumonia is not excluded. Clinical correlation is recommended. 2. Extensive degenerative changes of the spine and shoulders as well  as lower thoracic and lumbar posterior fixation hardware. Electronically Signed   By: Anner Crete M.D.   On: 11/11/2016 22:59   Dg Chest Port 1 View  Result Date: 11/13/2016 CLINICAL DATA:  Acute onset of shortness of breath. Initial encounter. EXAM: PORTABLE CHEST 1 VIEW COMPARISON:  Chest radiograph performed 11/11/2016 FINDINGS: The lungs are well-aerated. Vascular congestion is noted. Right perihilar and bibasilar airspace opacities raise concern for pulmonary edema. Small bilateral pleural effusions are noted. There is no evidence of pneumothorax. The cardiomediastinal silhouette is borderline normal in size. No acute osseous abnormalities are seen. Cervical and thoracolumbar spinal fusion hardware noted. Degenerative change is noted at both glenohumeral joints. IMPRESSION: Vascular congestion. Right perihilar and bibasilar airspace opacities raise concern for pulmonary edema. Small bilateral pleural effusions noted. Electronically Signed   By: Garald Balding M.D.   On: 11/13/2016 01:36   Scheduled Meds: . allopurinol  50 mg Oral Daily  . aspirin  81 mg Oral QODAY  . clopidogrel  75 mg Oral Daily  . furosemide  80 mg Intravenous Q8H  . insulin aspart  0-5 Units Subcutaneous QHS  . insulin aspart  0-9 Units  Subcutaneous TID WC  . metoprolol succinate  25 mg Oral Daily   Continuous Infusions: . dialysis solution 2.5% low-MG/low-CA    . heparin 1,000 Units/hr (11/13/16 5329)  . nitroGLYCERIN 35 mcg/min (11/13/16 1248)  . piperacillin-tazobactam (ZOSYN)  IV Stopped (11/13/16 1223)  . [START ON 11/14/2016] vancomycin       LOS: 1 day    Time spent: > 35  Velvet Bathe, MD [facility] Pager [phone]  If 7PM-7AM, please contact night-coverage [email or web address] Password [email or web address] 11/13/2016, 4:11 PM

## 2016-11-13 NOTE — Progress Notes (Signed)
  Echocardiogram 2D Echocardiogram has been performed.  [name] 11/13/2016, 4:43 PM

## 2016-11-13 NOTE — Consult Note (Signed)
Reason for Consult: ESRD  Referring Physician: Dr Dorise Bullion is an 83 y.o. male with PMH of end stage renal disease, coronary artery disease disease (status post myocardial infarction in 2009 on medical management), chronic combined systolic and diastolic congestive heart failure, ischemic cardiomyopathy, hypertension, gout, hyperglycemia HPI: Patient presented 10/24 with confusion, chest discomfort, shortness of breath and volume overload. He has a BMP of over 2000, peak troponin 1.96 with RBBB on EKG, and chest x-ray with vascular congestion. Creatinine is 5.09 which is consistent with recent baseline and BUN 103.   He follows with Dr. Lowanda Foster reportedly since the 90s. Family is not clear on the cause of his CKD however he has a history of uncontrolled hypertension, gout, and atherosclerotic disease. Family states that the patient has become more confused since the time of his surgery for peritoneal dialysis catheter placement 10/11. They report that prior to this surgery he was intermittently confused but for the most part oriented and able to care for himself.  [name] is disoriented and confused to the majority of this history was taken from his wife and 2 daughters who are in the room. He follows with Dr. Hinda Lenis- plans were being made for PD- s/p PD cath placement on 10/11 per Dr. Johney Maine- has been flushed and had one dressing change per PD nurse in [facility] [phone].  He is clearly uremic and volume overloaded at this time- is responding to lasix but confused  Trend in Creatinine: Creat  Date/Time Value Ref Range Status  05/14/2016 11:20 AM 4.26 (H) 0.70 - 1.11 mg/dL Final    Comment:      For patients > or = 81 years of age: The upper reference limit for Creatinine is approximately 13% higher for people identified as African-American.      Creatinine, Ser  Date/Time Value Ref Range Status  11/13/2016 08:55 AM 5.09 (H) 0.61 - 1.24 mg/dL Final  11/12/2016  04:36 AM 4.33 (H) 0.61 - 1.24 mg/dL Final  11/11/2016 10:40 PM 4.30 (H) 0.61 - 1.24 mg/dL Final  11/11/2016 10:17 PM 4.29 (H) 0.61 - 1.24 mg/dL Final  11/02/2016 04:46 AM 5.78 (H) 0.61 - 1.24 mg/dL Final  11/01/2016 06:08 AM 5.50 (H) 0.61 - 1.24 mg/dL Final  10/31/2016 12:36 PM 5.60 (H) 0.61 - 1.24 mg/dL Final  10/30/2016 07:57 AM 5.64 (H) 0.61 - 1.24 mg/dL Final  10/27/2016 02:43 PM 5.64 (H) 0.61 - 1.24 mg/dL Final  08/31/2013 09:44 AM 3.6 (H) 0.4 - 1.5 mg/dL Final  04/11/2013 11:16 AM 3.4 (H) 0.4 - 1.5 mg/dL Final  03/18/2013 03:26 PM 3.2 (H) 0.4 - 1.5 mg/dL Final  03/07/2013 07:20 AM 3.78 (H) 0.50 - 1.35 mg/dL Final  03/06/2013 05:35 AM 3.77 (H) 0.50 - 1.35 mg/dL Final    Comment:    DELTA CHECK NOTED  03/05/2013 03:35 AM 1.72 (H) 0.50 - 1.35 mg/dL Final    Comment:    DELTA CHECK NOTED  03/04/2013 03:34 AM 3.93 (H) 0.50 - 1.35 mg/dL Final  03/03/2013 05:40 AM 3.90 (H) 0.50 - 1.35 mg/dL Final  03/02/2013 07:29 PM 3.90 (H) 0.50 - 1.35 mg/dL Final  03/01/2013 03:05 AM 3.76 (H) 0.50 - 1.35 mg/dL Final  02/28/2013 05:00 AM 3.77 (H) 0.50 - 1.35 mg/dL Final  02/27/2013 06:30 AM 3.56 (H) 0.50 - 1.35 mg/dL Final  02/27/2013 01:10 AM 3.49 (H) 0.50 - 1.35 mg/dL Final  02/26/2013 08:25 PM 3.51 (H) 0.50 - 1.35 mg/dL Final  02/01/2013 10:48 AM 3.5 (  H) 0.4 - 1.5 mg/dL Final  09/29/2012 11:44 AM 3.6 (H) 0.4 - 1.5 mg/dL Final  04/20/2012 11:09 AM 3.2 (H) 0.4 - 1.5 mg/dL Final  12/23/2011 11:41 AM 3.1 (H) 0.4 - 1.5 mg/dL Final  09/10/2011 10:12 AM 3.1 (H) 0.4 - 1.5 mg/dL Final  12/30/2010 10:21 AM 3.0 (H) 0.4 - 1.5 mg/dL Final  12/04/2010 04:19 PM 2.9 (H) 0.4 - 1.5 mg/dL Final  09/05/2010 04:36 AM 2.88 (H) 0.50 - 1.35 mg/dL Final  09/04/2010 04:28 AM 2.69 (H) 0.50 - 1.35 mg/dL Final  09/03/2010 12:55 PM 2.93 (H) 0.50 - 1.35 mg/dL Final  08/27/2010 11:50 AM 2.98 (H) 0.50 - 1.35 mg/dL Final  07/15/2010 02:31 PM 3.0 (H) 0.4 - 1.5 mg/dL Final  11/05/2008 04:45 AM 2.80 (H) 0.4 - 1.5 mg/dL Final   11/04/2008 03:41 AM 2.63 (H) 0.4 - 1.5 mg/dL Final  10/27/2008 08:00 AM 2.71 (H) 0.4 - 1.5 mg/dL Final  04/12/2008 10:36 AM 2.39 (H) 0.4 - 1.5 mg/dL Final  03/28/2007 05:30 AM 2.25 (H)  Final  03/27/2007 04:05 AM 2.16 (H)  Final  03/26/2007 04:30 AM 2.13 (H)  Final  03/25/2007 04:30 AM 1.96 (H)  Final  03/24/2007 04:40 AM 1.92 (H)  Final  03/23/2007 04:05 PM 2.10 (H)  Final    PMH:   Past Medical History:  Diagnosis Date  . Acute respiratory failure with hypoxia ([place]) 03/03/2013  . Asthma   . CAD (coronary artery disease)- MI in '09 02/28/2013  . Cancer ([place])    lymphoma  1979    . CHF (congestive heart failure) ([place])    2015  . Chronic renal insufficiency    creatinine ranges around 2.8  . Complication of anesthesia    No anesthesia complications, but concerns about neck positioning due to cervical disease  . DOE (dyspnea on exertion)   . Dyspnea    upon exertion  . Gout   . HCAP (healthcare-associated pneumonia) 03/03/2013  . Herpes zoster 12/2009   left eye ..........last flare up "a long time"  . History of BPH   . HOH (hard of hearing)    hears well out of left ear  . Hypertension   . IHD (ischemic heart disease)    Remote MI in 2009 with late presentation; no reperfusion. Has large area of infarct in the apical anterior area on last nuclear in 2011. Managed medically  . Influenza A 03/03/2013  . MI, old 03/2007   ACUTE ANTEROSEPTAL  . NSTEMI (non-ST elevated myocardial infarction)- type 2 02/28/2013  . OA (osteoarthritis) of knee    Left knee with revision and replacement of prosthetic L TKR  . Peripheral edema   . RBBB (right bundle branch block)   . Renal insufficiency   . Staph infection    states that gentamycin & vancomycin, (30 - 45 days worth) are what did his kidneys in, per his daughter    PSH:   Past Surgical History:  Procedure Laterality Date  . BACK SURGERY     has had 3 surgeries on his back  . BILATERAL CARPAL TUNNEL RELEASE    . BUNIONECTOMY     . CAPD INSERTION N/A 10/30/2016   Procedure: LAPAROSCOPIC INSERTION CONTINUOUS AMBULATORY PERITONEAL DIALYSIS  (CAPD) CATHETER   OMENTOPEXY;  Surgeon: Michael Boston, MD;  Location: [facility];  Service: General;  Laterality: N/A;  . CARDIAC CATHETERIZATION  03/24/2007  . CARDIOVASCULAR STRESS TEST  04/02/2009   EF 27% AND A LARGE AREA OF INFARCT IN THE  APICAL ANTERIOR WITH MILD PERI-INFARCT ISCHEMIA  . FRACTURE SURGERY     both ankles  . INSERTION OF DIALYSIS CATHETER  10/30/2016   LAPAROSCOPIC INSERTION CONTINUOUS AMBULATORY PERITONEAL DIALYSIS  (CAPD) CATHETER   OMENTOPEXY  . JOINT REPLACEMENT     3 on right hip, 2 on knee  . KNEE SURGERY  1998   left knee  . neck surgeries (x2)    . TOE AMPUTATION     LEFT FOOT  . TOTAL KNEE ARTHROPLASTY     left knee  . TRANSTHORACIC ECHOCARDIOGRAM  04/04/2009   EF 45-50%  . TRANSURETHRAL RESECTION OF PROSTATE      Allergies:  Allergies  Allergen Reactions  . Gentamycin [Gentamicin] Other (See Comments)    Decreased kidney fx  . Crestor [Rosuvastatin Calcium] Other (See Comments)    MUSCLE PAIN  . Hctz [Hydrochlorothiazide] Other (See Comments)    dizziness  . Lipitor [Atorvastatin Calcium] Other (See Comments)    MUSCLE PAIN    Medications:   Prior to Admission medications   Medication Sig Start Date End Date Taking? Authorizing Provider  albuterol (PROVENTIL) (2.5 MG/3ML) 0.083% nebulizer solution INHALE EVERY 4-6HRS AS NEEDED FOR WHEEZING/SHORTNESS OF BREATH 10/01/16  Yes [provider]  allopurinol (ZYLOPRIM) 100 MG tablet Take one half tablet by mouth once daily. ADDITIONAL REFILLS FROM PCP Patient taking differently: Take 50 mg by mouth daily.  02/05/15  Yes Darlin Coco, MD  aspirin 81 MG tablet Take 81 mg by mouth every other day.     Yes [provider]  clopidogrel (PLAVIX) 75 MG tablet TAKE 1 TABLET BY MOUTH EVERY DAY Patient taking differently: TAKE 75 MG BY MOUTH EVERY DAY 09/08/16  Yes Martinique, Peter M, MD   furosemide (LASIX) 80 MG tablet Take 40 mg by mouth daily.    Yes [provider]  metoprolol tartrate (LOPRESSOR) 25 MG tablet Take 0.5 tablets (12.5 mg total) by mouth 2 (two) times daily. 11/02/16  Yes Rayburn, Floyce Stakes, PA-C  nitroGLYCERIN (NITROSTAT) 0.4 MG SL tablet Place 1 tablet (0.4 mg total) under the tongue every 5 (five) minutes as needed for chest pain (chest pain). 05/02/16  Yes Martinique, Peter M, MD  Propylene Glycol-Glycerin (SOOTHE OP) Place 1 drop into both eyes as needed (for dry eyes).   Yes [provider]  traMADol (ULTRAM) 50 MG tablet Take 1 tablet (50 mg total) by mouth every 6 (six) hours as needed for moderate pain or severe pain. 10/30/16  Yes Michael Boston, MD  triamcinolone ointment (KENALOG) 0.1 % Apply 1 application topically daily as needed (for itchy skin).  10/17/14  Yes [provider]    Inpatient medications: . allopurinol  50 mg Oral Daily  . aspirin  81 mg Oral QODAY  . clopidogrel  75 mg Oral Daily  . furosemide  80 mg Intravenous Q8H  . insulin aspart  0-5 Units Subcutaneous QHS  . insulin aspart  0-9 Units Subcutaneous TID WC  . metoprolol succinate  25 mg Oral Daily    Discontinued Meds:   Medications Discontinued During This Encounter  Medication Reason  . nitroGLYCERIN (NITROSTAT) SL tablet 0.4 mg   . heparin ADULT infusion 100 units/mL (25000 units/28mL sodium chloride 0.45%)   . furosemide (LASIX) tablet 40 mg   . metoprolol tartrate (LOPRESSOR) tablet 12.5 mg   . heparin ADULT infusion 100 units/mL (25000 units/286mL sodium chloride 0.45%)   . furosemide (LASIX) injection 80 mg     Social History:  reports  that he has never smoked. He has never used smokeless tobacco. He reports that he does not drink alcohol or use drugs.  Family History:   Family History  Problem Relation Age of Onset  . Coronary artery disease Mother   . Emphysema Father        never smoked, worked as a Psychologist, sport and exercise    Pertinent items are  noted in HPI. Weight change: 3 lb 12.8 oz (1.724 kg)  Intake/Output Summary (Last 24 hours) at 11/13/16 1536 Last data filed at 11/13/16 0700  Gross per 24 hour  Intake           403.93 ml  Output             1250 ml  Net          -846.07 ml   BP 131/90 (BP Location: Left Wrist)   Pulse (!) 115   Temp 97.8 F (36.6 C) (Oral)   Resp (!) 25   Ht 5\' 4"  (1.626 m)   Wt 180 lb 6.4 oz (81.8 kg)   SpO2 99%   BMI 30.97 kg/m  Vitals:   11/13/16 0105 11/13/16 0413 11/13/16 0700 11/13/16 1100  BP: (!) 144/93 131/90    Pulse: (!) 125 (!) 115    Resp: (!) 30 (!) 25    Temp:  99.8 F (37.7 C) 98 F (36.7 C) 97.8 F (36.6 C)  TempSrc:  Axillary Oral Oral  SpO2:  99%    Weight:  180 lb 6.4 oz (81.8 kg)    Height:         General: Chronically ill appearing elderly man, sleeping comfortably through the exam, disoriented 3, no acute distress Cardiac: Regular rate and rhythm, no appreciable murmur Pulmonary: Accessory muscle use with abdominal breathing, lungs sound clear over anterior lung fields Abdomen: Soft, distended, nontender with peritoneal dialysis catheter out of left upper quadrant, dressings are dry and intact Extremities: 1+ bilateral lower extremity pitting edema Skin: Large bruise over right face, bilateral upper extremities with chronic bruising, bandages in place over bilateral shins  Labs: Basic Metabolic Panel:  Recent Labs Lab 11/11/16 2217 11/11/16 2240 11/12/16 0436 11/13/16 0855  NA 142 143 144 144  K 4.5 4.5 4.8 4.7  CL 107 106 105 104  CO2 22  --  24 25  GLUCOSE 202* 200* 116* 146*  BUN 87* 78* 90* 103*  CREATININE 4.29* 4.30* 4.33* 5.09*  CALCIUM 9.6  --  9.8 9.3   Liver Function Tests: No results for input(s): AST, ALT, ALKPHOS, BILITOT, PROT, ALBUMIN in the last 168 hours. No results for input(s): LIPASE, AMYLASE in the last 168 hours.  Recent Labs Lab 11/13/16 0350  AMMONIA 50*   CBC:  Recent Labs Lab 11/11/16 2217 11/11/16 2240  11/12/16 0436 11/13/16 0855  WBC 16.7*  --  15.0* 18.6*  HGB 11.1* 11.6* 11.0* 9.6*  HCT 34.3* 34.0* 34.4* 29.9*  MCV 96.9  --  96.6 97.4  PLT 259  --  275 293   PT/INR: @LABRCNTIP (inr:5) Cardiac Enzymes: ) Recent Labs Lab 11/12/16 0037 11/12/16 0436 11/12/16 1559  TROPONINI 1.16* 1.74* 1.96*   CBG:  Recent Labs Lab 11/12/16 2042 11/13/16 0016 11/13/16 0425 11/13/16 0805 11/13/16 1128  GLUCAP 153* 136* 142* 148* 151*    Iron Studies: No results for input(s): IRON, TIBC, TRANSFERRIN, FERRITIN in the last 168 hours.  Xrays/Other Studies: Dg Chest 2 View  Result Date: 11/11/2016 CLINICAL DATA:  83 year old male with chest pain.  History  of MI. EXAM: CHEST  2 VIEW COMPARISON:  Chest radiograph dated 10/31/2016 FINDINGS: Cardiac monitor leads overlie the patient. There is stable cardiomegaly. There is vascular congestion, new or worsened compared to the prior radiograph. Superimposed pneumonia is not excluded. Clinical correlation is recommended. No large pleural effusion. There is no pneumothorax. A small stent noted over the right hemidiaphragm as seen on the prior radiograph. There is osteopenia with extensive degenerative changes of the spine. There are degenerative changes of the shoulder with flattening of the right humeral head. Cervical anterior fusion plate and screw as well as lower thoracic and lumbar posterior fixation hardware noted. IMPRESSION: 1. Cardiomegaly with vascular congestion. Pneumonia is not excluded. Clinical correlation is recommended. 2. Extensive degenerative changes of the spine and shoulders as well as lower thoracic and lumbar posterior fixation hardware. Electronically Signed   By: Anner Crete M.D.   On: 11/11/2016 22:59   Dg Chest Port 1 View  Result Date: 11/13/2016 CLINICAL DATA:  Acute onset of shortness of breath. Initial encounter. EXAM: PORTABLE CHEST 1 VIEW COMPARISON:  Chest radiograph performed 11/11/2016 FINDINGS: The lungs are  well-aerated. Vascular congestion is noted. Right perihilar and bibasilar airspace opacities raise concern for pulmonary edema. Small bilateral pleural effusions are noted. There is no evidence of pneumothorax. The cardiomediastinal silhouette is borderline normal in size. No acute osseous abnormalities are seen. Cervical and thoracolumbar spinal fusion hardware noted. Degenerative change is noted at both glenohumeral joints. IMPRESSION: Vascular congestion. Right perihilar and bibasilar airspace opacities raise concern for pulmonary edema. Small bilateral pleural effusions noted. Electronically Signed   By: Garald Balding M.D.   On: 11/13/2016 01:36     Assessment/Plan: 1. ESRD - Some concern that his confusion may be related to uremia, and he has volume overload on exam today. He has a potassium of 4.7, phosphorus unknown at this time, and he does not have an anion gap. Will initiate peritoneal dialysis this evening, will attempt low ultrafiltrate. Will need dressings changed over PD catheter. Will try CAPD, supine, low volumes all 2.5% fuid- to initiate tonight- given leukocytosis will check fluid cell counts but not suspicious of that being the source.  I have quoted family about a 30 % or so chance of failure with trying PD and needing to use hemodialysis but if we can avoid another procedure in him but using PD I think is worth it  2. Mineral metabolic disorder- follow-up phos and PTH levels-  Phos 5.4 - no binder, is OK  3. Anemia- follow-up iron studies- I will dose with ESA 4. Acute on chronic combined systolic and diastolic heart failure- continue IV Lasix 80 mg 3 times a day and Toprol 25 mg twice a day.  Will use 2.5 % fluid but with long dwell do not think will get much UF- he is responding to lasix 5. CAD on med mgmt - on heparin gtt and aspirin  6. Hypertension- will monitor for improvement in blood pressure with diuresis 7.? New diagnosis of diabetes- per primary  8. Gout - continue  allopurinol   [name] 11/13/2016, 3:36 PM   Patient seen and examined, agree with above note with above modifications. 83 year old with advanced CKD- followed by [place]- plans were being made to initiate PD- in fact had PD cath placed 2 weeks ago.  He is now uremic and volume overloaded, need to start RRT.  Since has PD cath in- will attempt to start PD- low volumes supine CAPD.  There is  a chance for mechanical failure or lack of clinical improvement which may force [place] to do hemodialysis- family is aware.  If cardiac cath is needed, would do , pt is at ESRD  Corliss Parish, MD 11/13/2016

## 2016-11-13 NOTE — Progress Notes (Signed)
Patient found to be in what looks like a-fib- this would be a new onset per family. NT called to complete an EKG on him. Charge nurse by bedside along with family. Will contact MD with update for any further orders.

## 2016-11-14 LAB — BODY FLUID CELL COUNT WITH DIFFERENTIAL
EOS FL: 2 %
Eos, Fluid: 9 %
LYMPHS FL: 26 %
Lymphs, Fluid: 4 %
MONOCYTE-MACROPHAGE-SEROUS FLUID: 88 % (ref 50–90)
MONOCYTE-MACROPHAGE-SEROUS FLUID: 37 % — AB (ref 50–90)
NEUTROPHIL FLUID: 6 % (ref 0–25)
NEUTROPHIL FLUID: 28 % — AB (ref 0–25)
WBC FLUID: 159 uL (ref 0–1000)
WBC FLUID: 86 uL (ref 0–1000)

## 2016-11-14 LAB — MRSA PCR SCREENING: MRSA BY PCR: NEGATIVE

## 2016-11-14 LAB — HEPATIC FUNCTION PANEL
ALBUMIN: 2.7 g/dL — AB (ref 3.5–5.0)
ALK PHOS: 90 U/L (ref 38–126)
ALT: 10 U/L — ABNORMAL LOW (ref 17–63)
AST: 20 U/L (ref 15–41)
Bilirubin, Direct: 0.2 mg/dL (ref 0.1–0.5)
Indirect Bilirubin: 0.7 mg/dL (ref 0.3–0.9)
TOTAL PROTEIN: 6.1 g/dL — AB (ref 6.5–8.1)
Total Bilirubin: 0.9 mg/dL (ref 0.3–1.2)

## 2016-11-14 LAB — BASIC METABOLIC PANEL
Anion gap: 16 — ABNORMAL HIGH (ref 5–15)
BUN: 107 mg/dL — AB (ref 6–20)
CHLORIDE: 101 mmol/L (ref 101–111)
CO2: 24 mmol/L (ref 22–32)
CREATININE: 5.29 mg/dL — AB (ref 0.61–1.24)
Calcium: 9.2 mg/dL (ref 8.9–10.3)
GFR calc Af Amer: 10 mL/min — ABNORMAL LOW (ref >60)
GFR calc non Af Amer: 9 mL/min — ABNORMAL LOW (ref >60)
GLUCOSE: 201 mg/dL — AB (ref 65–99)
POTASSIUM: 4.3 mmol/L (ref 3.5–5.1)
Sodium: 141 mmol/L (ref 135–145)

## 2016-11-14 LAB — URINALYSIS, ROUTINE W REFLEX MICROSCOPIC
Bilirubin Urine: NEGATIVE
GLUCOSE, UA: NEGATIVE mg/dL
Ketones, ur: NEGATIVE mg/dL
NITRITE: NEGATIVE
PROTEIN: 30 mg/dL — AB
Specific Gravity, Urine: 1.011 (ref 1.005–1.030)
pH: 5 (ref 5.0–8.0)

## 2016-11-14 LAB — HEPARIN LEVEL (UNFRACTIONATED): Heparin Unfractionated: 0.54 IU/mL (ref 0.30–0.70)

## 2016-11-14 LAB — PARATHYROID HORMONE, INTACT (NO CA): PTH: 224 pg/mL — ABNORMAL HIGH (ref 15–65)

## 2016-11-14 LAB — PATHOLOGIST SMEAR REVIEW

## 2016-11-14 LAB — GRAM STAIN

## 2016-11-14 LAB — GLUCOSE, CAPILLARY
GLUCOSE-CAPILLARY: 154 mg/dL — AB (ref 65–99)
Glucose-Capillary: 180 mg/dL — ABNORMAL HIGH (ref 65–99)
Glucose-Capillary: 171 mg/dL — ABNORMAL HIGH (ref 65–99)
Glucose-Capillary: 185 mg/dL — ABNORMAL HIGH (ref 65–99)
Glucose-Capillary: 157 mg/dL — ABNORMAL HIGH (ref 65–99)

## 2016-11-14 MED ORDER — ACETAMINOPHEN 650 MG RE SUPP: 650.0000 mg | Freq: Four times a day (QID) | RECTAL | Status: DC | PRN

## 2016-11-14 MED ORDER — DARBEPOETIN ALFA 200 MCG/0.4ML IJ SOSY
200.0000 ug | PREFILLED_SYRINGE | Freq: Once | INTRAMUSCULAR | Status: AC
  Administered 2016-11-14: 200 ug via SUBCUTANEOUS
  Filled 2016-11-14: qty 0.4

## 2016-11-14 MED ORDER — RENA-VITE PO TABS
1.0000 | ORAL_TABLET | Freq: Every day | ORAL | Status: DC
  Administered 2016-11-14: 1 via ORAL
  Filled 2016-11-14 (×2): qty 1

## 2016-11-14 MED ORDER — ACETAMINOPHEN 650 MG RE SUPP
650.0000 mg | RECTAL | Status: DC | PRN
  Administered 2016-11-14 (×2): 650 mg via RECTAL
  Filled 2016-11-14 (×2): qty 1

## 2016-11-14 MED ORDER — ACETAMINOPHEN 325 MG PO TABS: 650.0000 mg | ORAL_TABLET | ORAL | Status: DC | PRN

## 2016-11-14 MED ORDER — FERUMOXYTOL INJECTION 510 MG/17 ML
510.0000 mg | Freq: Once | INTRAVENOUS | Status: AC
  Administered 2016-11-14: 510 mg via INTRAVENOUS
  Filled 2016-11-14: qty 17

## 2016-11-14 MED ORDER — PIPERACILLIN-TAZOBACTAM IN DEX 2-0.25 GM/50ML IV SOLN
2.2500 g | Freq: Two times a day (BID) | INTRAVENOUS | Status: DC
  Administered 2016-11-14 – 2016-11-15 (×2): 2.25 g via INTRAVENOUS
  Filled 2016-11-14 (×4): qty 50

## 2016-11-14 NOTE — Progress Notes (Signed)
The patient converted back to A-fib from NSR. EKG taken. Patient ~80 to ~100 HR, BP 143/75 still has amio gtt infusing. I will continue to monitor the patient closely.   Saddie Benders RN

## 2016-11-14 NOTE — Progress Notes (Signed)
Spoke with Dr. Radford Pax on-call for Cardiology regarding pt's intermittent Afib and continued increased work of breathing. Verbally told to initiate Amio gtt with previously ordered bolus and to monitor closely. Pt's daughter at bedside and updated. HD department also called at daughter's request to obtain ETA for continuance of pt's  PD. Updated provided to pt's daughter.

## 2016-11-14 NOTE — Progress Notes (Signed)
Progress Note  Patient Name: [name] Date of Encounter: 11/14/2016  Primary Cardiologist: Dr. Martinique   Subjective   Still short of breath.  Feeling tired.  No chest pain.   Inpatient Medications    Scheduled Meds: . allopurinol  50 mg Oral Daily  . aspirin  81 mg Oral QODAY  . clopidogrel  75 mg Oral Daily  . furosemide  80 mg Intravenous Q8H  . insulin aspart  0-5 Units Subcutaneous QHS  . insulin aspart  0-9 Units Subcutaneous TID WC  . metoprolol succinate  25 mg Oral Daily  . multivitamin  1 tablet Oral QHS  . mupirocin ointment   Nasal BID   Continuous Infusions: . amiodarone 30 mg/hr (11/14/16 0604)  . dialysis solution 2.5% low-MG/low-CA    . ferumoxytol    . heparin 1,000 Units/hr (11/14/16 0600)  . nitroGLYCERIN Stopped (11/14/16 0234)  . piperacillin-tazobactam (ZOSYN)  IV    . vancomycin     PRN Meds: acetaminophen **OR** acetaminophen, albuterol, dianeal solution for CAPD/CCPD with heparin, morphine injection, ondansetron (ZOFRAN) IV, polyvinyl alcohol, traMADol   Vital Signs    Vitals:   11/14/16 0505 11/14/16 0535 11/14/16 0605 11/14/16 0837  BP: 111/71 126/71 121/70 118/65  Pulse: 70 71  84  Resp: (!) 31 (!) 30 (!) 34   Temp:    98.3 F (36.8 C)  TempSrc:    Oral  SpO2: 98% 100%    Weight:      Height:        Intake/Output Summary (Last 24 hours) at 11/14/16 1012 Last data filed at 11/14/16 0600  Gross per 24 hour  Intake          1303.73 ml  Output             1045 ml  Net           258.73 ml   Filed Weights   11/12/16 1523 11/13/16 0413 11/14/16 0145  Weight: 82 kg (180 lb 12.8 oz) 81.8 kg (180 lb 6.4 oz) 82 kg (180 lb 12.4 oz)    Telemetry    Sinus tachycardia.  Rate 100s-130s.- Personally Reviewed  ECG    N/a - Personally Reviewed  Physical Exam   VS:  BP 118/65   Pulse 84   Temp 98.3 F (36.8 C) (Oral)   Resp (!) 34   Ht 5\' 4"  (1.626 m)   Wt 82 kg (180 lb 12.4 oz)   SpO2 100%   BMI 31.03 kg/m  , BMI Body  mass index is 31.03 kg/m. GENERAL: Chronically ill-appearing elderly man in no acute distress.  Lethargic.  HEENT: Pupils equal round and reactive, fundi not visualized, oral mucosa unremarkable NECK:  +JVD.  waveform within normal limits, carotid upstroke brisk and symmetric, no bruits, no thyromegaly LUNGS:  Diminished at bases.  HEART:  RRR.  PMI not displaced or sustained,S1 and S2 within normal limits, no S3, no S4, no clicks, no rubs, no murmurs ABD:  Flat, positive bowel sounds normal in frequency in pitch, no bruits, no rebound, no guarding, no midline pulsatile mass, no hepatomegaly, no splenomegaly.  PD catheter in place EXT:  2 plus pulses throughout, 1+ LE edema, no cyanosis no clubbing SKIN:  No rashes no nodules NEURO:  Cranial nerves II through XII grossly intact, motor grossly intact throughout [facility]:  Cognitively intact, oriented to person place and time   [employer]  Recent Labs Lab 11/12/16 0436 11/13/16 7989  11/14/16 0547  NA 144 144 141  K 4.8 4.7 4.3  CL 105 104 101  CO2 24 25 24   GLUCOSE 116* 146* 201*  BUN 90* 103* 107*  CREATININE 4.33* 5.09* 5.29*  CALCIUM 9.8 9.3 9.2  GFRNONAA 11* 9* 9*  GFRAA 13* 11* 10*  ANIONGAP 15 15 16*     Hematology  Recent Labs Lab 11/11/16 2217 11/11/16 2240 11/12/16 0436 11/13/16 0855  WBC 16.7*  --  15.0* 18.6*  RBC 3.54*  --  3.56* 3.07*  HGB 11.1* 11.6* 11.0* 9.6*  HCT 34.3* 34.0* 34.4* 29.9*  MCV 96.9  --  96.6 97.4  MCH 31.4  --  30.9 31.3  MCHC 32.4  --  32.0 32.1  RDW 15.5  --  15.6* 15.7*  PLT 259  --  275 293    Cardiac Enzymes  Recent Labs Lab 11/12/16 0037 11/12/16 0436 11/12/16 1559  TROPONINI 1.16* 1.74* 1.96*     Recent Labs Lab 11/11/16 2233  TROPIPOC 0.62*     BNP  Recent Labs Lab 11/11/16 2239  BNP 2,178.5*     DDimer No results for input(s): DDIMER in the last 168 hours.   Radiology    Dg Chest Port 1 View  Result Date: 11/13/2016 CLINICAL DATA:  Acute  onset of shortness of breath. Initial encounter. EXAM: PORTABLE CHEST 1 VIEW COMPARISON:  Chest radiograph performed 11/11/2016 FINDINGS: The lungs are well-aerated. Vascular congestion is noted. Right perihilar and bibasilar airspace opacities raise concern for pulmonary edema. Small bilateral pleural effusions are noted. There is no evidence of pneumothorax. The cardiomediastinal silhouette is borderline normal in size. No acute osseous abnormalities are seen. Cervical and thoracolumbar spinal fusion hardware noted. Degenerative change is noted at both glenohumeral joints. IMPRESSION: Vascular congestion. Right perihilar and bibasilar airspace opacities raise concern for pulmonary edema. Small bilateral pleural effusions noted. Electronically Signed   By: Garald Balding M.D.   On: 11/13/2016 01:36    Cardiac Studies   Echo 11/14/16:  Study Conclusions  - Left ventricle: The cavity size was mildly dilated. Wall   thickness was normal. Systolic function was severely reduced. The   estimated ejection fraction was in the range of 25% to 30%.   Diffuse hypokinesis. There is akinesis of the anterior and apical   myocardium. Doppler parameters are consistent with restrictive   physiology, indicative of decreased left ventricular diastolic   compliance and/or increased left atrial pressure. Doppler   parameters are consistent with high ventricular filling pressure. - Mitral valve: Calcified annulus. There was mild to moderate   regurgitation. - Right ventricle: The cavity size was mildly dilated.  Impressions:  - Definity used; Diffuse hypokinesis with akinesis of the anterior   wall and apex; overall severely reduced LV systolic function;   restrictive filling; mild LVE; mild to moderate MR; mild RVE.  Patient Profile     Mr. Frampton is an 74M with CAD s/p medically managed STEMI, and CKD V here with acute on chronic systolic and diastolic heart failure and possible HCAP.  Assessment &  Plan    # Acute on chronic systolic and diastolic heart failure: LVEF reduced to 25-30% with akinesis of the anterior and apical myocardium.  This is reduced from 02/27/13.  Mr. Fambro is volume overloaded.  He is not diuresing well with lasix and renal function is worsening.  He started PD last night. Appreciate nephrology assistance.  # NSTEMI: Troponin is elevated to 1.96.  He will need a left  heart catheterization at some point.  However he is febrile, lethargic, and has a leukocytosis.  Will consider once he is more stable.  Continue aspirin, clopidogrel, and heparin.  # Paroxysmal atrial fibrillation: Noted intermittently this hospitalization.  Continue amiodarone and heparin.  # CDK V: Started PD as above.   For questions or updates, please contact [place] Please consult [email or web address] for contact info under Cardiology/STEMI.      Signed, Skeet Latch, MD  11/14/2016, 10:12 AM

## 2016-11-14 NOTE — Progress Notes (Signed)
[name] for Vanco/Zosyn Indication: HCAP  Allergies  Allergen Reactions  . Gentamycin [Gentamicin] Other (See Comments)    Decreased kidney fx  . Crestor [Rosuvastatin Calcium] Other (See Comments)    MUSCLE PAIN  . Hctz [Hydrochlorothiazide] Other (See Comments)    dizziness  . Lipitor [Atorvastatin Calcium] Other (See Comments)    MUSCLE PAIN    Patient Measurements: Height: 5\' 4"  (162.6 cm) Weight: 180 lb 12.4 oz (82 kg) IBW/kg (Calculated) : 59.2 Adjusted Body Weight:    Vital Signs: Temp: 98.3 F (36.8 C) (10/26 0837) Temp Source: Oral (10/26 0837) BP: 118/65 (10/26 0837) Pulse Rate: 84 (10/26 0837) Intake/Output from previous day: 10/25 0701 - 10/26 0700 In: 1543.7 [P.O.:480; I.V.:913.7; IV Piggyback:150] Out: 1045 [Urine:1045] Intake/Output from this shift: No intake/output data recorded.  Labs:  Recent Labs  11/11/16 2217 11/11/16 2240 11/12/16 0436 11/13/16 0855 11/14/16 0547  WBC 16.7*  --  15.0* 18.6*  --   HGB 11.1* 11.6* 11.0* 9.6*  --   PLT 259  --  275 293  --   CREATININE 4.29* 4.30* 4.33* 5.09* 5.29*   Estimated Creatinine Clearance: 9.9 mL/min (A) (by C-G formula based on SCr of 5.29 mg/dL (H)). No results for input(s): VANCOTROUGH, VANCOPEAK, VANCORANDOM, GENTTROUGH, GENTPEAK, GENTRANDOM, TOBRATROUGH, TOBRAPEAK, TOBRARND, AMIKACINPEAK, AMIKACINTROU, AMIKACIN in the last 72 hours.   Microbiology:   Medical History: Past Medical History:  Diagnosis Date  . Acute respiratory failure with hypoxia ([place]) 03/03/2013  . Asthma   . CAD (coronary artery disease)- MI in '09 02/28/2013  . Cancer ([facility])    lymphoma  1979    . CHF (congestive heart failure) ([place])    2015  . Chronic renal insufficiency    creatinine ranges around 2.8  . Complication of anesthesia    No anesthesia complications, but concerns about neck positioning due to cervical disease  . DOE (dyspnea on exertion)   . Dyspnea    upon exertion   . Gout   . HCAP (healthcare-associated pneumonia) 03/03/2013  . Herpes zoster 12/2009   left eye ..........last flare up "a long time"  . History of BPH   . HOH (hard of hearing)    hears well out of left ear  . Hypertension   . IHD (ischemic heart disease)    Remote MI in 2009 with late presentation; no reperfusion. Has large area of infarct in the apical anterior area on last nuclear in 2011. Managed medically  . Influenza A 03/03/2013  . MI, old 03/2007   ACUTE ANTEROSEPTAL  . NSTEMI (non-ST elevated myocardial infarction)- type 2 02/28/2013  . OA (osteoarthritis) of knee    Left knee with revision and replacement of prosthetic L TKR  . Peripheral edema   . RBBB (right bundle branch block)   . Renal insufficiency   . Staph infection    states that gentamycin & vancomycin, (30 - 45 days worth) are what did his kidneys in, per his daughter   Assessment:  Anticoag: IV heparin for ACS. Developed new afib. HL 0.54. Hgb 11>9.6. Plts stable. No CBC today.CHADS2VASC 6 (including DM and vascular dz)  ID: HCAP. WBC 15>18.6. PC up to 0.25. ESRD. Tmax 102.2 last PM. Currently afebrile. First PD overnight.  Vanco 10/24>> Zosyn 10/24>>  10/24 BC x 2>> 10/26 peritoneal fluid gram stain: negative   Goal of Therapy:  Heparin level 0.3-0.7 Vanco level <20 prior to redosing in PD Eradication of infection   Plan:  **  Vanc 1500mg  x 1 on 10/24 (not enough loading dose for a PD patient) then 1g q48 (allow dose 10/26). Will stop order and check levels q 3-4 days as recommended for PD dosing. -Zosyn 2.25g changed to q 12 hr for PD dosing. -IV heparin 1000 units/hr. Daily HL and CBC. Plans for afib anticoag? -No renal notes?   [name], PharmD, BCPS Clinical Staff Pharmacist Pager [phone]  [name] 11/14/2016,9:56 AM

## 2016-11-14 NOTE — Progress Notes (Signed)
RN called about the pt's rectal temperature 102.2 and RR 34. Blood cx x 2 and rectal tylenol ordered. On IV vanc and IV zosyn.

## 2016-11-14 NOTE — Progress Notes (Signed)
PROGRESS NOTE    [name]  SWN:[id number] DOB: [birth year] DOA: 11/11/2016 PCP: Celene Squibb, MD    Brief Narrative:  83 y.o. male with medical history significant for medically managed coronary artery disease, chronic combined systolic/diastolic CHF, hypertension, and chronic kidney disease stage IV-V, now presenting to the emergency department for evaluation of chest discomfort and dyspnea.    Assessment & Plan:   Principal Problem:   NSTEMI (non-ST elevated myocardial infarction)- type 2 - Cardiology consulted and managing - pt is on heparin gtt - heart cath once patient is more stable.  Active Problems:   CKD (chronic kidney disease), stage V [facility]) - Nephrology consulted and assisting with management patient started PD dialysis sessions.    Acute on chronic systolic and diastolic heart failure, NYHA class 4 (HCC) - pt on metoprolol and lasix.    Leukocytosis/suspected pna. - plan will be to continue vancomycin and Zosyn. Concern was that initial chest x-ray reported finding suspicious for pneumonia. Awaiting blood cultures and sputum cultures    Hyperglycemia - Follow-up with hemoglobin A1c and continue sliding scale insulin  DVT prophylaxis: Pt is on heparin gtt Code Status: Full Family Communication: None at bedside.  Disposition Plan: pending further recommendations by specialist   Consultants:   Nephrology  Cardiology   Procedures: none   Antimicrobials: none   Subjective: No new complaints reported to me  Objective: Vitals:   11/14/16 0605 11/14/16 0837 11/14/16 1242 11/14/16 1400  BP: 121/70 118/65  114/66  Pulse:  84  70  Resp: (!) 34   (!) 28  Temp:  98.3 F (36.8 C) 100.1 F (37.8 C) 98.9 F (37.2 C)  TempSrc:  Oral Rectal Oral  SpO2:    99%  Weight:      Height:        Intake/Output Summary (Last 24 hours) at 11/14/16 1734 Last data filed at 11/14/16 1000  Gross per 24 hour  Intake          1275.93 ml  Output               445 ml  Net           830.93 ml   Filed Weights   11/12/16 1523 11/13/16 0413 11/14/16 0145  Weight: 82 kg (180 lb 12.8 oz) 81.8 kg (180 lb 6.4 oz) 82 kg (180 lb 12.4 oz)    Examination: Unchanged when compared to 11/13/2016  General exam: Appears calm and comfortable, in nad. Respiratory system: Clear to auscultation. Respiratory effort normal. Cardiovascular system: S1 & S2 heard, RRR. No JVD, murmurs, rubs, gallops or clicks. No pedal edema. Gastrointestinal system: Abdomen is nondistended, soft and nontender. No organomegaly or masses felt. Normal bowel sounds heard. Central nervous system: Alert and oriented. No facial asymmetry Extremities: Symmetric 5 x 5 power. Skin: No rashes, lesions or ulcers, on limited exam. Psychiatry: Mood & affect appropriate.     Data Reviewed: I have personally reviewed following labs and imaging studies  CBC:  Recent Labs Lab 11/11/16 2217 11/11/16 2240 11/12/16 0436 11/13/16 0855  WBC 16.7*  --  15.0* 18.6*  HGB 11.1* 11.6* 11.0* 9.6*  HCT 34.3* 34.0* 34.4* 29.9*  MCV 96.9  --  96.6 97.4  PLT 259  --  275 938   Basic Metabolic Panel:  Recent Labs Lab 11/11/16 2217 11/11/16 2240 11/12/16 0436 11/13/16 0855 11/13/16 1449 11/14/16 0547  NA 142 143 144 144  --  141  K 4.5 4.5 4.8  4.7  --  4.3  CL 107 106 105 104  --  101  CO2 22  --  24 25  --  24  GLUCOSE 202* 200* 116* 146*  --  201*  BUN 87* 78* 90* 103*  --  107*  CREATININE 4.29* 4.30* 4.33* 5.09*  --  5.29*  CALCIUM 9.6  --  9.8 9.3  --  9.2  PHOS  --   --   --   --  5.4*  --    GFR: Estimated Creatinine Clearance: 9.9 mL/min (A) (by C-G formula based on SCr of 5.29 mg/dL (H)). Liver Function Tests:  Recent Labs Lab 11/14/16 1209  AST 20  ALT 10*  ALKPHOS 90  BILITOT 0.9  PROT 6.1*  ALBUMIN 2.7*   No results for input(s): LIPASE, AMYLASE in the last 168 hours.  Recent Labs Lab 11/13/16 0350  AMMONIA 50*   Coagulation Profile: No results for input(s):  INR, PROTIME in the last 168 hours. Cardiac Enzymes:  Recent Labs Lab 11/12/16 0037 11/12/16 0436 11/12/16 1559  TROPONINI 1.16* 1.74* 1.96*   BNP (last 3 results) No results for input(s): PROBNP in the last 8760 hours. HbA1C:  Recent Labs  11/13/16 1449  HGBA1C 6.0*   CBG:  Recent Labs Lab 11/13/16 1936 11/13/16 2200 11/14/16 0736 11/14/16 1142 11/14/16 1653  GLUCAP 191* 180* 171* 154* 185*   Lipid Profile: No results for input(s): CHOL, HDL, LDLCALC, TRIG, CHOLHDL, LDLDIRECT in the last 72 hours. Thyroid Function Tests: No results for input(s): TSH, T4TOTAL, FREET4, T3FREE, THYROIDAB in the last 72 hours. Anemia Panel:  Recent Labs  11/13/16 1449  TIBC 210*  IRON 40*   Sepsis Labs:  Recent Labs Lab 11/12/16 0037 11/12/16 0436 11/13/16 0350  PROCALCITON 0.22 0.20 0.25    Recent Results (from the past 240 hour(s))  Culture, blood (routine x 2)     Status: None (Preliminary result)   Collection Time: 11/12/16  6:32 PM  Result Value Ref Range Status   Specimen Description BLOOD RIGHT HAND  Final   Special Requests   Final    BOTTLES DRAWN AEROBIC AND ANAEROBIC Blood Culture adequate volume   Culture NO GROWTH 2 DAYS  Final   Report Status PENDING  Incomplete  Culture, blood (routine x 2)     Status: None (Preliminary result)   Collection Time: 11/12/16  6:42 PM  Result Value Ref Range Status   Specimen Description BLOOD LEFT HAND  Final   Special Requests   Final    BOTTLES DRAWN AEROBIC AND ANAEROBIC Blood Culture adequate volume   Culture NO GROWTH 2 DAYS  Final   Report Status PENDING  Incomplete  MRSA PCR Screening     Status: None   Collection Time: 11/13/16 10:26 PM  Result Value Ref Range Status   MRSA by PCR NEGATIVE NEGATIVE Final    Comment:        The GeneXpert MRSA Assay (FDA approved for NASAL specimens only), is one component of a comprehensive MRSA colonization surveillance program. It is not intended to diagnose  MRSA infection nor to guide or monitor treatment for MRSA infections.   Stat Gram stain     Status: None   Collection Time: 11/14/16  2:00 AM  Result Value Ref Range Status   Specimen Description FLUID PERITONEAL  Final   Special Requests NONE  Final   Gram Stain   Final    FEW WBC PRESENT, PREDOMINANTLY MONONUCLEAR NO ORGANISMS SEEN  Report Status 11/14/2016 FINAL  Final  Gram stain     Status: None   Collection Time: 11/14/16 11:41 AM  Result Value Ref Range Status   Specimen Description PERITONEAL  Final   Special Requests NONE  Final   Gram Stain   Final    RARE WBC PRESENT, PREDOMINANTLY MONONUCLEAR NO ORGANISMS SEEN    Report Status 11/14/2016 FINAL  Final     Radiology Studies: Dg Chest Port 1 View  Result Date: 11/13/2016 CLINICAL DATA:  Acute onset of shortness of breath. Initial encounter. EXAM: PORTABLE CHEST 1 VIEW COMPARISON:  Chest radiograph performed 11/11/2016 FINDINGS: The lungs are well-aerated. Vascular congestion is noted. Right perihilar and bibasilar airspace opacities raise concern for pulmonary edema. Small bilateral pleural effusions are noted. There is no evidence of pneumothorax. The cardiomediastinal silhouette is borderline normal in size. No acute osseous abnormalities are seen. Cervical and thoracolumbar spinal fusion hardware noted. Degenerative change is noted at both glenohumeral joints. IMPRESSION: Vascular congestion. Right perihilar and bibasilar airspace opacities raise concern for pulmonary edema. Small bilateral pleural effusions noted. Electronically Signed   By: Garald Balding M.D.   On: 11/13/2016 01:36   Scheduled Meds: . allopurinol  50 mg Oral Daily  . aspirin  81 mg Oral QODAY  . clopidogrel  75 mg Oral Daily  . furosemide  80 mg Intravenous Q8H  . insulin aspart  0-5 Units Subcutaneous QHS  . insulin aspart  0-9 Units Subcutaneous TID WC  . metoprolol succinate  25 mg Oral Daily  . multivitamin  1 tablet Oral QHS  .  mupirocin ointment   Nasal BID   Continuous Infusions: . amiodarone 30 mg/hr (11/14/16 1210)  . dialysis solution 2.5% low-MG/low-CA    . heparin 1,000 Units/hr (11/14/16 0600)  . nitroGLYCERIN Stopped (11/14/16 0234)  . piperacillin-tazobactam (ZOSYN)  IV Stopped (11/14/16 1517)  . vancomycin 1,000 mg (11/14/16 1708)     LOS: 2 days    Time spent: > 35  Velvet Bathe, MD [facility] Pager [phone]  If 7PM-7AM, please contact night-coverage [email or web address] Password [facility] 11/14/2016, 5:34 PM

## 2016-11-14 NOTE — Progress Notes (Signed)
S: Mr. [name] is less interactive today. His daughter is in the room and says that he tolerated PD well overnight but was awake all night.   O:BP 118/65   Pulse 84   Temp 98.3 F (36.8 C) (Oral)   Resp (!) 34   Ht 5\' 4"  (1.626 m)   Wt 180 lb 12.4 oz (82 kg)   SpO2 100%   BMI 31.03 kg/m   Intake/Output Summary (Last 24 hours) at 11/14/16 1006 Last data filed at 11/14/16 0600  Gross per 24 hour  Intake          1303.73 ml  Output             1045 ml  Net           258.73 ml   Intake/Output: I/O last 3 completed shifts: In: 1810.2 [P.O.:480; I.V.:1130.2; IV Piggyback:200] Out: 2295 [Urine:2295]  Intake/Output this shift:  No intake/output data recorded. Weight change: -0.4 oz (-0.01 kg) Gen: No acute distress, chronically ill-appearing, scleral icterus, mumbling and falling asleep through the exam  Neuro: PERRLA, right eyelid droop reportedly postsurgical, Strength decreased but equal in bilateral upper extremities, full neuro exam difficult to perform as the patient is very tired today CVS: Regular rate and rhythm, no appreciable murmur Resp: Accessory muscle use, lungs are clear to auscultation bilateral scattered rhonchi Abd: Abdomen is distended, soft, nontender, PD catheter is in place with dressings clean dry and intact Ext: 1+ lower extremity pitting edema Foley catheter draining urine  Recent Labs Lab 11/11/16 2217 11/11/16 2240 11/12/16 0436 11/13/16 0855 11/13/16 1449 11/14/16 0547  NA 142 143 144 144  --  141  K 4.5 4.5 4.8 4.7  --  4.3  CL 107 106 105 104  --  101  CO2 22  --  24 25  --  24  GLUCOSE 202* 200* 116* 146*  --  201*  BUN 87* 78* 90* 103*  --  107*  CREATININE 4.29* 4.30* 4.33* 5.09*  --  5.29*  CALCIUM 9.6  --  9.8 9.3  --  9.2  PHOS  --   --   --   --  5.4*  --    Liver Function Tests: No results for input(s): AST, ALT, ALKPHOS, BILITOT, PROT, ALBUMIN in the last 168 hours. No results for input(s): LIPASE, AMYLASE in the last 168  hours.  Recent Labs Lab 11/13/16 0350  AMMONIA 50*   CBC:  Recent Labs Lab 11/11/16 2217 11/11/16 2240 11/12/16 0436 11/13/16 0855  WBC 16.7*  --  15.0* 18.6*  HGB 11.1* 11.6* 11.0* 9.6*  HCT 34.3* 34.0* 34.4* 29.9*  MCV 96.9  --  96.6 97.4  PLT 259  --  275 293   Cardiac Enzymes:  Recent Labs Lab 11/12/16 0037 11/12/16 0436 11/12/16 1559  TROPONINI 1.16* 1.74* 1.96*   CBG:  Recent Labs Lab 11/13/16 1128 11/13/16 1538 11/13/16 1936 11/13/16 2200 11/14/16 0736  GLUCAP 151* 161* 191* 180* 171*    Iron Studies:  Recent Labs  11/13/16 1449  IRON 40*  TIBC 210*   Studies/Results: Dg Chest Port 1 View  Result Date: 11/13/2016 CLINICAL DATA:  Acute onset of shortness of breath. Initial encounter. EXAM: PORTABLE CHEST 1 VIEW COMPARISON:  Chest radiograph performed 11/11/2016 FINDINGS: The lungs are well-aerated. Vascular congestion is noted. Right perihilar and bibasilar airspace opacities raise concern for pulmonary edema. Small bilateral pleural effusions are noted. There is no evidence of pneumothorax. The cardiomediastinal silhouette is borderline  normal in size. No acute osseous abnormalities are seen. Cervical and thoracolumbar spinal fusion hardware noted. Degenerative change is noted at both glenohumeral joints. IMPRESSION: Vascular congestion. Right perihilar and bibasilar airspace opacities raise concern for pulmonary edema. Small bilateral pleural effusions noted. Electronically Signed   By: Garald Balding M.D.   On: 11/13/2016 01:36   . allopurinol  50 mg Oral Daily  . aspirin  81 mg Oral QODAY  . clopidogrel  75 mg Oral Daily  . furosemide  80 mg Intravenous Q8H  . insulin aspart  0-5 Units Subcutaneous QHS  . insulin aspart  0-9 Units Subcutaneous TID WC  . metoprolol succinate  25 mg Oral Daily  . multivitamin  1 tablet Oral QHS  . mupirocin ointment   Nasal BID    BMET    Component Value Date/Time   NA 141 11/14/2016 0547   K 4.3  11/14/2016 0547   CL 101 11/14/2016 0547   CO2 24 11/14/2016 0547   GLUCOSE 201 (H) 11/14/2016 0547   BUN 107 (H) 11/14/2016 0547   CREATININE 5.29 (H) 11/14/2016 0547   CREATININE 4.26 (H) 05/14/2016 1120   CALCIUM 9.2 11/14/2016 0547   GFRNONAA 9 (L) 11/14/2016 0547   GFRAA 10 (L) 11/14/2016 0547   CBC    Component Value Date/Time   WBC 18.6 (H) 11/13/2016 0855   RBC 3.07 (L) 11/13/2016 0855   HGB 9.6 (L) 11/13/2016 0855   HCT 29.9 (L) 11/13/2016 0855   PLT 293 11/13/2016 0855   MCV 97.4 11/13/2016 0855   MCH 31.3 11/13/2016 0855   MCHC 32.1 11/13/2016 0855   RDW 15.7 (H) 11/13/2016 0855   LYMPHSABS 0.7 03/03/2013 0540   MONOABS 1.9 (H) 03/03/2013 0540   EOSABS 0.0 03/03/2013 0540   BASOSABS 0.0 03/03/2013 0540     Assessment/Plan:  1. ESRD- He tolerated PD well over night however remains uremic today with  volume overload. Today potassium is stable, BUN remains elevated. Will increase to2  L volume and increase osmolality of PD fluid. Need to ^ clearance and vol off. Will ^ vol/freq of exchanges and % dextrose 2. Anemia of chronic kidney disease - IV feraheme and aranesp 3. Mineral metabolic disorder - phos and PTH are elevated, no need for binder at this time but will need to continue to monitor 4. Acute on chronic combined systolic and diastolic heart failure- likely difficutly tolerating po at this time so continue IV Lasix 80 mg 3 times a day and Toprol 25 mg twice a day.   5. CAD on med mgmt - on heparin gtt 6. Hypertension- improving  7. Gout - continue allopurinol  8. Scleral icterus - with elevated INR, follow up hepatic function   Ledell Noss  I have seen and examined this patient and agree with the plan of care seen, eval, examined, discussed with patient and daughter, resident. .  Gerard Cantara L 11/14/2016, 2:25 PM

## 2016-11-14 NOTE — Progress Notes (Signed)
@  [address] [facility] regarding pt's rectal temp of 102.2. PR APAP and STAT blood cultures ordered. Daughter at bedside updated and due to pt being stuck several times, already recently drawn blood cultures, and pt currently being on antibiotics she respectfully refused new blood cultures at this time. Order dc'd for lab. Will continue to monitor and assess.

## 2016-11-15 ENCOUNTER — Inpatient Hospital Stay (HOSPITAL_COMMUNITY): Payer: Medicare HMO

## 2016-11-15 LAB — RENAL FUNCTION PANEL
ALBUMIN: 2.4 g/dL — AB (ref 3.5–5.0)
Anion gap: 16 — ABNORMAL HIGH (ref 5–15)
BUN: 102 mg/dL — ABNORMAL HIGH (ref 6–20)
CALCIUM: 8.9 mg/dL (ref 8.9–10.3)
CO2: 24 mmol/L (ref 22–32)
CREATININE: 5.77 mg/dL — AB (ref 0.61–1.24)
Chloride: 97 mmol/L — ABNORMAL LOW (ref 101–111)
GFR, EST AFRICAN AMERICAN: 9 mL/min — AB (ref >60)
GFR, EST NON AFRICAN AMERICAN: 8 mL/min — AB (ref >60)
Glucose, Bld: 284 mg/dL — ABNORMAL HIGH (ref 65–99)
Phosphorus: 7 mg/dL — ABNORMAL HIGH (ref 2.5–4.6)
Potassium: 3.9 mmol/L (ref 3.5–5.1)
SODIUM: 137 mmol/L (ref 135–145)

## 2016-11-15 LAB — CBC
HCT: 25.3 % — ABNORMAL LOW (ref 39.0–52.0)
HCT: 26.6 % — ABNORMAL LOW (ref 39.0–52.0)
HEMATOCRIT: 24.5 % — AB (ref 39.0–52.0)
HEMOGLOBIN: 7.9 g/dL — AB (ref 13.0–17.0)
Hemoglobin: 8.1 g/dL — ABNORMAL LOW (ref 13.0–17.0)
Hemoglobin: 8.7 g/dL — ABNORMAL LOW (ref 13.0–17.0)
MCH: 31.1 pg (ref 26.0–34.0)
MCH: 31.2 pg (ref 26.0–34.0)
MCH: 31.8 pg (ref 26.0–34.0)
MCHC: 32 g/dL (ref 30.0–36.0)
MCHC: 32.2 g/dL (ref 30.0–36.0)
MCHC: 32.7 g/dL (ref 30.0–36.0)
MCV: 96.5 fL (ref 78.0–100.0)
MCV: 97.3 fL (ref 78.0–100.0)
MCV: 97.1 fL (ref 78.0–100.0)
PLATELETS: 307 10*3/uL (ref 150–400)
PLATELETS: 332 10*3/uL (ref 150–400)
PLATELETS: 307 10*3/uL (ref 150–400)
RBC: 2.54 MIL/uL — ABNORMAL LOW (ref 4.22–5.81)
RBC: 2.6 MIL/uL — AB (ref 4.22–5.81)
RBC: 2.74 MIL/uL — AB (ref 4.22–5.81)
RDW: 15.7 % — ABNORMAL HIGH (ref 11.5–15.5)
RDW: 16 % — ABNORMAL HIGH (ref 11.5–15.5)
RDW: 15.8 % — AB (ref 11.5–15.5)
WBC: 21.5 10*3/uL — AB (ref 4.0–10.5)
WBC: 24.2 10*3/uL — AB (ref 4.0–10.5)
WBC: 27.3 10*3/uL — ABNORMAL HIGH (ref 4.0–10.5)

## 2016-11-15 LAB — GLUCOSE, CAPILLARY
GLUCOSE-CAPILLARY: 271 mg/dL — AB (ref 65–99)
Glucose-Capillary: 208 mg/dL — ABNORMAL HIGH (ref 65–99)
Glucose-Capillary: 83 mg/dL (ref 65–99)
Glucose-Capillary: 98 mg/dL (ref 65–99)

## 2016-11-15 LAB — HEPARIN LEVEL (UNFRACTIONATED): HEPARIN UNFRACTIONATED: 0.31 [IU]/mL (ref 0.30–0.70)

## 2016-11-15 LAB — BASIC METABOLIC PANEL
ANION GAP: 17 — AB (ref 5–15)
Anion gap: 15 (ref 5–15)
BUN: 104 mg/dL — ABNORMAL HIGH (ref 6–20)
BUN: 101 mg/dL — AB (ref 6–20)
CHLORIDE: 98 mmol/L — AB (ref 101–111)
CO2: 25 mmol/L (ref 22–32)
CO2: 26 mmol/L (ref 22–32)
Calcium: 9 mg/dL (ref 8.9–10.3)
Calcium: 9.1 mg/dL (ref 8.9–10.3)
Chloride: 97 mmol/L — ABNORMAL LOW (ref 101–111)
Creatinine, Ser: 5.4 mg/dL — ABNORMAL HIGH (ref 0.61–1.24)
Creatinine, Ser: 5.7 mg/dL — ABNORMAL HIGH (ref 0.61–1.24)
GFR calc Af Amer: 10 mL/min — ABNORMAL LOW (ref >60)
GFR calc Af Amer: 9 mL/min — ABNORMAL LOW (ref >60)
GFR calc non Af Amer: 9 mL/min — ABNORMAL LOW (ref >60)
GFR, EST NON AFRICAN AMERICAN: 8 mL/min — AB (ref >60)
GLUCOSE: 100 mg/dL — AB (ref 65–99)
GLUCOSE: 159 mg/dL — AB (ref 65–99)
POTASSIUM: 4.3 mmol/L (ref 3.5–5.1)
POTASSIUM: 4.1 mmol/L (ref 3.5–5.1)
SODIUM: 138 mmol/L (ref 135–145)
Sodium: 140 mmol/L (ref 135–145)

## 2016-11-15 LAB — HEPATITIS B CORE ANTIBODY, TOTAL: HEP B C TOTAL AB: NEGATIVE

## 2016-11-15 LAB — APTT: APTT: 34 s (ref 24–36)

## 2016-11-15 LAB — HEPATITIS B SURFACE ANTIBODY,QUALITATIVE: Hep B S Ab: NONREACTIVE

## 2016-11-15 LAB — HEPATITIS B SURFACE ANTIGEN: HEP B S AG: NEGATIVE

## 2016-11-15 MED ORDER — SODIUM CHLORIDE 0.9 % IV SOLN: 100.0000 mL | INTRAVENOUS | Status: DC | PRN

## 2016-11-15 MED ORDER — HEPARIN SODIUM (PORCINE) 1000 UNIT/ML DIALYSIS
1000.0000 [IU] | INTRAMUSCULAR | Status: DC | PRN
  Administered 2016-11-16: 1000 [IU] via INTRAVENOUS_CENTRAL
  Filled 2016-11-15 (×2): qty 1

## 2016-11-15 MED ORDER — LIDOCAINE HCL (CARDIAC) 20 MG/ML IV SOLN
INTRAVENOUS | Status: AC
  Filled 2016-11-15: qty 5

## 2016-11-15 MED ORDER — LIDOCAINE HCL (PF) 1 % IJ SOLN
5.0000 mL | INTRAMUSCULAR | Status: DC | PRN
  Filled 2016-11-15: qty 30

## 2016-11-15 MED ORDER — FUROSEMIDE 10 MG/ML IJ SOLN
160.0000 mg | Freq: Four times a day (QID) | INTRAVENOUS | Status: DC
  Administered 2016-11-15: 160 mg via INTRAVENOUS
  Filled 2016-11-15 (×3): qty 16
  Filled 2016-11-15: qty 4
  Filled 2016-11-15: qty 16

## 2016-11-15 MED ORDER — HEPARIN SODIUM (PORCINE) 1000 UNIT/ML DIALYSIS
40.0000 [IU]/kg | Freq: Once | INTRAMUSCULAR | Status: AC
  Administered 2016-11-16: 3300 [IU] via INTRAVENOUS_CENTRAL
  Filled 2016-11-15: qty 4

## 2016-11-15 MED ORDER — PENTAFLUOROPROP-TETRAFLUOROETH EX AERO: 1.0000 "application " | INHALATION_SPRAY | CUTANEOUS | Status: DC | PRN

## 2016-11-15 MED ORDER — PIPERACILLIN-TAZOBACTAM IN DEX 2-0.25 GM/50ML IV SOLN
2.2500 g | Freq: Two times a day (BID) | INTRAVENOUS | Status: DC
  Administered 2016-11-16: 2.25 g via INTRAVENOUS
  Filled 2016-11-15 (×3): qty 50

## 2016-11-15 MED ORDER — MIDODRINE HCL 5 MG PO TABS
ORAL_TABLET | ORAL | Status: AC
  Filled 2016-11-15: qty 2

## 2016-11-15 MED ORDER — LIDOCAINE-PRILOCAINE 2.5-2.5 % EX CREA
1.0000 "application " | TOPICAL_CREAM | CUTANEOUS | Status: DC | PRN
  Filled 2016-11-15: qty 5

## 2016-11-15 MED ORDER — LIDOCAINE HCL (PF) 1 % IJ SOLN
INTRAMUSCULAR | Status: AC
  Administered 2016-11-15: 20:00:00
  Filled 2016-11-15: qty 30

## 2016-11-15 MED ORDER — NITROGLYCERIN 0.4 MG SL SUBL: 0.4000 mg | SUBLINGUAL_TABLET | SUBLINGUAL | Status: DC | PRN

## 2016-11-15 MED ORDER — ALTEPLASE 2 MG IJ SOLR: 2.0000 mg | Freq: Once | INTRAMUSCULAR | Status: DC | PRN

## 2016-11-15 NOTE — Progress Notes (Addendum)
PROGRESS NOTE    [name]  KDT:[id number] DOB: 1928-09-28 DOA: 11/11/2016 PCP: Celene Squibb, MD    Brief Narrative:  83 y.o. male with medical history significant for medically managed coronary artery disease, chronic combined systolic/diastolic CHF, hypertension, and chronic kidney disease stage IV-V, now presenting to the emergency department for evaluation of chest discomfort and dyspnea.    Assessment & Plan:   Principal Problem:   NSTEMI (non-ST elevated myocardial infarction)- type 2 - Cardiology consulted and managing - pt is on heparin gtt - heart cath once patient is more stable.  Active Problems:   CKD (chronic kidney disease), stage V [facility]) - Nephrology consulted and assisting with management patient started PD dialysis sessions. - Pt appears more swollen this am. Nursing reports he has not had much output with lasix.   Anemia - suspect this is chronic problem and with worsening edema I currently suspect patient may have dilutional effect - no source of bleeding although patient has had some pink tinged fluid with PD.     Acute on chronic systolic and diastolic heart failure, NYHA class 4 (HCC) - pt on metoprolol and lasix. Not much response to lasix per my discussion with nursing - Will obtain chest x ray. Last one reported findings suspicious for fluid overloading.    Leukocytosis/suspected pna. - Pt is currently on Zosyn. Concern was that initial chest x-ray reported finding suspicious for pneumonia. Awaiting blood cultures and sputum cultures    Hyperglycemia - Hgb a1c is 6.0 - continue SSI while in house. On d/c would treat with diet control  DVT prophylaxis: Pt is on heparin gtt Code Status: Full Family Communication: None at bedside.  Disposition Plan: pending further recommendations by specialist   Consultants:   Nephrology  Cardiology   Procedures: none   Antimicrobials: none   Subjective: Increased sob  Objective: Vitals:   11/15/16 0115 11/15/16 0500 11/15/16 0610 11/15/16 0817  BP: 108/60 (!) 110/59 97/67 124/76  Pulse: 71 70 (!) 102 (!) 111  Resp: (!) 25 (!) 31 (!) 30 (!) 32  Temp: 97.8 F (36.6 C) 98.3 F (36.8 C) 97.9 F (36.6 C)   TempSrc: Oral Oral Oral   SpO2: 93% 90%  97%  Weight:  82.9 kg (182 lb 11.2 oz)    Height:        Intake/Output Summary (Last 24 hours) at 11/15/16 0848 Last data filed at 11/15/16 0600  Gross per 24 hour  Intake           752.66 ml  Output             4900 ml  Net         -4147.34 ml   Filed Weights   11/13/16 0413 11/14/16 0145 11/15/16 0500  Weight: 81.8 kg (180 lb 6.4 oz) 82 kg (180 lb 12.4 oz) 82.9 kg (182 lb 11.2 oz)    Examination:   General exam: Pt in nad, alert and awake Respiratory system: +expiratory wheeze, no rhales, equal chest rise.  Cardiovascular system: S1 & S2 heard, RRR. No JVD, murmurs, rubs, gallops or clicks. No pedal edema. Gastrointestinal system: Abdomen is nondistended, soft and nontender. No organomegaly or masses felt. Normal bowel sounds heard. Central nervous system: Alert and oriented. No facial asymmetry Extremities: Symmetric 5 x 5 power. Skin: No rashes, lesions or ulcers, on limited exam. Psychiatry: Mood & affect appropriate.   EKG reviewed: Afib with rvr at rate of 103, no St elevations or depressions  Data Reviewed: I have personally reviewed following labs and imaging studies  CBC:  Recent Labs Lab 11/11/16 2217 11/11/16 2240 11/12/16 0436 11/13/16 0855 11/15/16 0022 11/15/16 0456  WBC 16.7*  --  15.0* 18.6* 27.3* 24.2*  HGB 11.1* 11.6* 11.0* 9.6* 8.7* 7.9*  HCT 34.3* 34.0* 34.4* 29.9* 26.6* 24.5*  MCV 96.9  --  96.6 97.4 97.1 96.5  PLT 259  --  275 293 332 476   Basic Metabolic Panel:  Recent Labs Lab 11/12/16 0436 11/13/16 0855 11/13/16 1449 11/14/16 0547 11/15/16 0022 11/15/16 0456  NA 144 144  --  141 138 140  K 4.8 4.7  --  4.3 4.3 4.1  CL 105 104  --  101 98* 97*  CO2 24 25  --  24 25 26     GLUCOSE 116* 146*  --  201* 100* 159*  BUN 90* 103*  --  107* 101* 104*  CREATININE 4.33* 5.09*  --  5.29* 5.40* 5.70*  CALCIUM 9.8 9.3  --  9.2 9.0 9.1  PHOS  --   --  5.4*  --   --   --    GFR: Estimated Creatinine Clearance: 9.2 mL/min (A) (by C-G formula based on SCr of 5.7 mg/dL (H)). Liver Function Tests:  Recent Labs Lab 11/14/16 1209  AST 20  ALT 10*  ALKPHOS 90  BILITOT 0.9  PROT 6.1*  ALBUMIN 2.7*   No results for input(s): LIPASE, AMYLASE in the last 168 hours.  Recent Labs Lab 11/13/16 0350  AMMONIA 50*   Coagulation Profile: No results for input(s): INR, PROTIME in the last 168 hours. Cardiac Enzymes:  Recent Labs Lab 11/12/16 0037 11/12/16 0436 11/12/16 1559  TROPONINI 1.16* 1.74* 1.96*   BNP (last 3 results) No results for input(s): PROBNP in the last 8760 hours. HbA1C:  Recent Labs  11/13/16 1449  HGBA1C 6.0*   CBG:  Recent Labs Lab 11/14/16 0736 11/14/16 1142 11/14/16 1653 11/14/16 2048 11/15/16 0725  GLUCAP 171* 154* 185* 157* 271*   Lipid Profile: No results for input(s): CHOL, HDL, LDLCALC, TRIG, CHOLHDL, LDLDIRECT in the last 72 hours. Thyroid Function Tests: No results for input(s): TSH, T4TOTAL, FREET4, T3FREE, THYROIDAB in the last 72 hours. Anemia Panel:  Recent Labs  11/13/16 1449  TIBC 210*  IRON 40*   Sepsis Labs:  Recent Labs Lab 11/12/16 0037 11/12/16 0436 11/13/16 0350  PROCALCITON 0.22 0.20 0.25    Recent Results (from the past 240 hour(s))  Culture, blood (routine x 2)     Status: None (Preliminary result)   Collection Time: 11/12/16  6:32 PM  Result Value Ref Range Status   Specimen Description BLOOD RIGHT HAND  Final   Special Requests   Final    BOTTLES DRAWN AEROBIC AND ANAEROBIC Blood Culture adequate volume   Culture NO GROWTH 3 DAYS  Final   Report Status PENDING  Incomplete  Culture, blood (routine x 2)     Status: None (Preliminary result)   Collection Time: 11/12/16  6:42 PM  Result  Value Ref Range Status   Specimen Description BLOOD LEFT HAND  Final   Special Requests   Final    BOTTLES DRAWN AEROBIC AND ANAEROBIC Blood Culture adequate volume   Culture NO GROWTH 3 DAYS  Final   Report Status PENDING  Incomplete  MRSA PCR Screening     Status: None   Collection Time: 11/13/16 10:26 PM  Result Value Ref Range Status   MRSA by PCR NEGATIVE NEGATIVE  Final    Comment:        The GeneXpert MRSA Assay (FDA approved for NASAL specimens only), is one component of a comprehensive MRSA colonization surveillance program. It is not intended to diagnose MRSA infection nor to guide or monitor treatment for MRSA infections.   Stat Gram stain     Status: None   Collection Time: 11/14/16  2:00 AM  Result Value Ref Range Status   Specimen Description FLUID PERITONEAL  Final   Special Requests NONE  Final   Gram Stain   Final    FEW WBC PRESENT, PREDOMINANTLY MONONUCLEAR NO ORGANISMS SEEN    Report Status 11/14/2016 FINAL  Final  Culture, body fluid-bottle     Status: None (Preliminary result)   Collection Time: 11/14/16 11:41 AM  Result Value Ref Range Status   Specimen Description PERITONEAL  Final   Special Requests NONE  Final   Culture NO GROWTH < 24 HOURS  Final   Report Status PENDING  Incomplete  Gram stain     Status: None   Collection Time: 11/14/16 11:41 AM  Result Value Ref Range Status   Specimen Description PERITONEAL  Final   Special Requests NONE  Final   Gram Stain   Final    RARE WBC PRESENT, PREDOMINANTLY MONONUCLEAR NO ORGANISMS SEEN    Report Status 11/14/2016 FINAL  Final     Radiology Studies: No results found. Scheduled Meds: . allopurinol  50 mg Oral Daily  . aspirin  81 mg Oral QODAY  . clopidogrel  75 mg Oral Daily  . furosemide  80 mg Intravenous Q8H  . insulin aspart  0-5 Units Subcutaneous QHS  . insulin aspart  0-9 Units Subcutaneous TID WC  . metoprolol succinate  25 mg Oral Daily  . multivitamin  1 tablet Oral QHS  .  mupirocin ointment   Nasal BID   Continuous Infusions: . amiodarone 16.7 mg/hr (11/15/16 0113)  . dialysis solution 2.5% low-MG/low-CA    . heparin 1,000 Units/hr (11/15/16 0114)  . nitroGLYCERIN Stopped (11/14/16 0234)  . piperacillin-tazobactam (ZOSYN)  IV Stopped (11/15/16 0559)     LOS: 3 days    Time spent: > 35 min  Velvet Bathe, MD [facility] Pager [phone]  If 7PM-7AM, please contact night-coverage [email or web address] Password [facility] 11/15/2016, 8:48 AM

## 2016-11-15 NOTE — Procedures (Addendum)
Procedure: Replacement of central venous HD catheter Indication: Need for dialysis  The procedure was explained to the patient and daughter with the consent signed. The left supra-and infraclavicular area was betadine scrubbed. After infiltration the the skin and subq tissues with 1% lidocaine, the left subclavian vein was accessed using the Seldinger technique. After gaining venous access, the guidewire was threaded and the needle removed. The vessel was then dilated with a dilator and the dilator removed. The HD catheter was then threaded over the guidewire, and the latter removed. After flushing all ports, the catheter was sutured into place. The patient tolerated the procedure well. A post-procedure CXR will be obtained to verify catheter position and r/o a pneumothorax.  11/15/2016 21:09  PCXR reviewed by myself. Catheter in good position and no ptx.

## 2016-11-15 NOTE — Progress Notes (Signed)
CCMD at bedside verbal to stop Heparin gtt for HD cath placement. Will reassess after placement. I will continue to monitor the patient closely.   Saddie Benders RN

## 2016-11-15 NOTE — Progress Notes (Signed)
Assisted MD with CVC placement, pt tolerated well. VSS before, during and after procedure. Chest xray ordered, completed and read. MD verified correct placement. Rn verified with MD and pharmacy about restarting heparin gtt. Gtt restarted. HD made aware that pt ready for HD treatment at this time. Will continue to monitor. Family-wife, daughters, sons updated and at bedside.

## 2016-11-15 NOTE — Progress Notes (Signed)
CCMD Crim notified of lab results for held heparin for viewing. I will continue to monitor the patient closely.   Saddie Benders RN

## 2016-11-15 NOTE — Consult Note (Signed)
PULMONARY / CRITICAL CARE MEDICINE   Name: [name] MRN: [id number] DOB: [birth year]    ADMISSION DATE:  11/11/2016 CONSULTATION DATE:  11/15/2016  REFERRING MD:  Lenna Sciara. Deterding  CHIEF COMPLAINT:  SOB due to pulmonary edema, secondary to volume overload resulting from CKD  HISTORY OF PRESENT ILLNESS:   This is an 83 y.o. W M with CAD and s/p STEMI and CKD state IV-V, admitted 10/24 with NSTEMI. Had PD catheter placed 10/30/2016 which is not removing as much fluid as desired. Today he became more SOB such that he was placed on BiPAP and Nephrology is now requesting placement of an HD catheter for volume removal.  PAST MEDICAL HISTORY :  He  has a past medical history of Acute respiratory failure with hypoxia ([place]) (03/03/2013); Asthma; CAD (coronary artery disease)- MI in '09 (02/28/2013); Cancer [facility]); CHF (congestive heart failure) ([place]); Chronic renal insufficiency; Complication of anesthesia; DOE (dyspnea on exertion); Dyspnea; Gout; HCAP (healthcare-associated pneumonia) (03/03/2013); Herpes zoster (12/2009); History of BPH; HOH (hard of hearing); Hypertension; IHD (ischemic heart disease); Influenza A (03/03/2013); MI, old (03/2007); NSTEMI (non-ST elevated myocardial infarction)- type 2 (02/28/2013); OA (osteoarthritis) of knee; Peripheral edema; RBBB (right bundle branch block); Renal insufficiency; and Staph infection.  PAST SURGICAL HISTORY: He  has a past surgical history that includes Cardiac catheterization (03/24/2007); Total knee arthroplasty; Cardiovascular stress test (04/02/2009); transthoracic echocardiogram (04/04/2009); Knee surgery (1998); Back surgery; Fracture surgery; Joint replacement; Bilateral carpal tunnel release; neck surgeries (x2); Bunionectomy; Transurethral resection of prostate; Toe amputation; Insertion of dialysis catheter (10/30/2016); and CAPD insertion (N/A, 10/30/2016).  Allergies  Allergen Reactions  . Gentamycin [Gentamicin] Other (See Comments)    Decreased  kidney fx  . Crestor [Rosuvastatin Calcium] Other (See Comments)    MUSCLE PAIN  . Hctz [Hydrochlorothiazide] Other (See Comments)    dizziness  . Lipitor [Atorvastatin Calcium] Other (See Comments)    MUSCLE PAIN    No current facility-administered medications on file prior to encounter.    Current Outpatient Prescriptions on File Prior to Encounter  Medication Sig  . albuterol (PROVENTIL) (2.5 MG/3ML) 0.083% nebulizer solution INHALE EVERY 4-6HRS AS NEEDED FOR WHEEZING/SHORTNESS OF BREATH  . allopurinol (ZYLOPRIM) 100 MG tablet Take one half tablet by mouth once daily. ADDITIONAL REFILLS FROM PCP (Patient taking differently: Take 50 mg by mouth daily. )  . aspirin 81 MG tablet Take 81 mg by mouth every other day.    . clopidogrel (PLAVIX) 75 MG tablet TAKE 1 TABLET BY MOUTH EVERY DAY (Patient taking differently: TAKE 75 MG BY MOUTH EVERY DAY)  . furosemide (LASIX) 80 MG tablet Take 40 mg by mouth daily.   . metoprolol tartrate (LOPRESSOR) 25 MG tablet Take 0.5 tablets (12.5 mg total) by mouth 2 (two) times daily.  . nitroGLYCERIN (NITROSTAT) 0.4 MG SL tablet Place 1 tablet (0.4 mg total) under the tongue every 5 (five) minutes as needed for chest pain (chest pain).  . Propylene Glycol-Glycerin (SOOTHE OP) Place 1 drop into both eyes as needed (for dry eyes).  . traMADol (ULTRAM) 50 MG tablet Take 1 tablet (50 mg total) by mouth every 6 (six) hours as needed for moderate pain or severe pain.  Marland Kitchen triamcinolone ointment (KENALOG) 0.1 % Apply 1 application topically daily as needed (for itchy skin).     FAMILY HISTORY:  His indicated that his mother is deceased. He indicated that his father is deceased. He indicated that his maternal grandmother is deceased. He indicated that his maternal grandfather is deceased.  He indicated that his paternal grandmother is deceased. He indicated that his paternal grandfather is deceased.    SOCIAL HISTORY: He  reports that he has never smoked. He has  never used smokeless tobacco. He reports that he does not drink alcohol or use drugs.  REVIEW OF SYSTEMS:   No prior chronic respiratory, endocrine or neuro disorders per wife. Other ROS per HPI.  SUBJECTIVE:  Voicing no c/o  VITAL SIGNS: BP 124/76 (BP Location: Left Arm)   Pulse 68   Temp 97.9 F (36.6 C) (Oral)   Resp (!) 28   Ht 5\' 4"  (1.626 m)   Wt 82.9 kg (182 lb 11.2 oz)   SpO2 98%   BMI 31.36 kg/m   HEMODYNAMICS:    VENTILATOR SETTINGS: BiPAP 40% 13/6    INTAKE / OUTPUT: I/O last 3 completed shifts: In: 1452.1 [P.O.:200; I.V.:952.1; IV Piggyback:300] Out: 4034 [Urine:745; Other:4600]  PHYSICAL EXAMINATION: General:  WD/WN W M on BiPAP  Neuro:  No focal deficits HEENT:  [place]/AT; PERRL EOMI Cardiovascular:  irreg rhythm Lungs:  No crackles, wheezes Abdomen:  PD catheter in situ; NT, no organomegaly Musculoskeletal:  No obvious joint deformities Skin:  Trace edema  LABS:  BMET  Recent Labs Lab 11/15/16 0022 11/15/16 0456 11/15/16 0947  NA 138 140 137  K 4.3 4.1 3.9  CL 98* 97* 97*  CO2 25 26 24   BUN 101* 104* 102*  CREATININE 5.40* 5.70* 5.77*  GLUCOSE 100* 159* 284*    Electrolytes  Recent Labs Lab 11/13/16 1449  11/15/16 0022 11/15/16 0456 11/15/16 0947  CALCIUM  --   < > 9.0 9.1 8.9  PHOS 5.4*  --   --   --  7.0*  < > = values in this interval not displayed.  CBC  Recent Labs Lab 11/15/16 0022 11/15/16 0456 11/15/16 0947  WBC 27.3* 24.2* 21.5*  HGB 8.7* 7.9* 8.1*  HCT 26.6* 24.5* 25.3*  PLT 332 307 307    Coag's No results for input(s): APTT, INR in the last 168 hours.  Sepsis Markers  Recent Labs Lab 11/12/16 0037 11/12/16 0436 11/13/16 0350  PROCALCITON 0.22 0.20 0.25    ABG  Recent Labs Lab 11/13/16 0128  PHART 7.336*  PCO2ART 45.2  PO2ART 135*    Liver Enzymes  Recent Labs Lab 11/14/16 1209 11/15/16 0947  AST 20  --   ALT 10*  --   ALKPHOS 90  --   BILITOT 0.9  --   ALBUMIN 2.7* 2.4*     Cardiac Enzymes  Recent Labs Lab 11/12/16 0037 11/12/16 0436 11/12/16 1559  TROPONINI 1.16* 1.74* 1.96*    Glucose  Recent Labs Lab 11/13/16 2200 11/14/16 0736 11/14/16 1142 11/14/16 1653 11/14/16 2048 11/15/16 0725  GLUCAP 180* 171* 154* 185* 157* 271*    Imaging Dg Chest Port 1 View  Result Date: 11/15/2016 CLINICAL DATA:  Kidney EXAM: PORTABLE CHEST 1 VIEW COMPARISON:  Chest x-ray dated 11/13/2016 and chest x-ray dated 10/31/2016. FINDINGS: Cardiomegaly is stable. Atherosclerotic changes noted at the aortic arch. Lungs are clear, questionable mild atelectasis at each lung base. No pleural effusion or pneumothorax seen. No acute or suspicious osseous finding. Fixation hardware again noted within the cervical and thoracic spine. Extensive degenerative changes again noted at each shoulder. IMPRESSION: No active disease.  No evidence of pneumonia or pulmonary edema. Stable cardiomegaly. Aortic atherosclerosis. Electronically Signed   By: Franki Cabot M.D.   On: 11/15/2016 09:49     STUDIES:  Personally  reviewed CXR. NAD  CULTURES: Pending  ANTIBIOTICS: Vanc + Zosyn  SIGNIFICANT EVENTS: None  LINES/TUBES: PD catheter  DISCUSSION: Concern for volume overload although no evidence pulm edema. Worsening SOB could be secondary to possible new onset a-fib with RVR per ECG.  ASSESSMENT / PLAN:  PULMONARY A: No evidence acute parenchymal lung disease. Has been on full anticoagulation , so doubt acute PE as cause of new a-fib. P:   Titrate BiPAP  CARDIOVASCULAR A:  New onset a-fib P:  Per Cardiol  RENAL A:   CKD with inadequate fluid removal with PD P:   Will hold heparin until after HD catheter placed      [facility] [place] Pager: [phone]  11/15/2016, 11:04 AM

## 2016-11-15 NOTE — Progress Notes (Signed)
The patient had an increase in lasix today (please see MAR), at this time output from 7am shift start has been ~63mL. Notified Deterding with Nephrology. Bladder scan showed ~0 to 9mL with foley still in place. I will continue to monitor the patient closely.   Saddie Benders RN

## 2016-11-15 NOTE — Progress Notes (Signed)
Subjective: Interval History: has complaints , more SOB, CP.  Prolonged PD intervals from what was prescribed..  Objective: Vital signs in last 24 hours: Temp:  [97.7 F (36.5 C)-100.1 F (37.8 C)] 97.9 F (36.6 C) (10/27 0610) Pulse Rate:  [68-111] 68 (10/27 0850) Resp:  [25-32] 28 (10/27 0850) BP: (97-124)/(59-76) 124/76 (10/27 0817) SpO2:  [90 %-99 %] 98 % (10/27 0850) Weight:  [82.9 kg (182 lb 11.2 oz)] 82.9 kg (182 lb 11.2 oz) (10/27 0500) Weight change: 0.872 kg (1 lb 14.8 oz)  Intake/Output from previous day: 10/26 0701 - 10/27 0700 In: 752.7 [P.O.:200; I.V.:302.7; IV Piggyback:250] Out: 4900 [Urine:300] Intake/Output this shift: No intake/output data recorded.  General appearance: moderate distress, moderately obese, pale, toxic and dyspneic, shallow resp Resp: rales bilaterally and rhonchi bilaterally Cardio: irregularly irregular rhythm and systolic murmur: systolic ejection 2/6, decrescendo irreg rate 100-120 GI: tight, pos bs, PD cath. Extremities: edema 3+  Lab Results:  Recent Labs  11/15/16 0022 11/15/16 0456  WBC 27.3* 24.2*  HGB 8.7* 7.9*  HCT 26.6* 24.5*  PLT 332 307   BMET:  Recent Labs  11/15/16 0022 11/15/16 0456  NA 138 140  K 4.3 4.1  CL 98* 97*  CO2 25 26  GLUCOSE 100* 159*  BUN 101* 104*  CREATININE 5.40* 5.70*  CALCIUM 9.0 9.1    Recent Labs  11/13/16 1449  PTH 224*   Iron Studies:  Recent Labs  11/13/16 1449  IRON 40*  TIBC 210*    Studies/Results: No results found.  I have reviewed the patient's current medications.  Assessment/Plan: 1 ESRD not enough vol removed, not good PD intervals or recording of vols,  Need vol off , solute lower.  2 CAD vol and primary ischemia 3 Anemia lower Hb , ? Related to bleeding, ?GI, given Fe and use ESA 4 DM 5 Confusion ? Dementia and mild uremia. 6 HPTH vit D P Temp HD cath, HD emergent, drain PD,     LOS: 3 days   Beyza Bellino L 11/15/2016,8:58 AM

## 2016-11-15 NOTE — Progress Notes (Signed)
During morning assessment shift change the patient is breathing ~40 per minute, labored, pursed lips, shallow, with dyspnea at rest. Responds to voice, stating "not feeling good." RN called respiratory to place on Bipap. Notified Vega MD with family medicine about changes and to come assess the patient. Notified Deterding with nephrology to come assess the patient. Both MD's came to bedside. (please see MD notes) I will continue to update family at bedside and monitor the patient closely.   Saddie Benders RN

## 2016-11-15 NOTE — Procedures (Addendum)
Procedure: Placement of central venous HD catheter Indication: Need for dialysis  The procedure was explained to the patient and wife and the consent signed. The right supra-and infraclavicular area was betadine scrubbed. After infiltration the the skin and subq tissues with 1% lidocaine, the right subclavian vein was accessed using the Seldinger technique. After gaining venous access, the guidewire was threaded and the needle removed. The vessel was then dilated with a dilator and the dilator removed. The HD catheter was then threaded over the guidewire, and the latter removed. After flushing all ports, the catheter was sutured into place. The patient tolerated the procedure well. A post-procedure CXR will be obtained to verify catheter position and r/o a pneumothorax.   11/15/2016: 19:40  Post procedure CXR shows catheter passing from subclavian vein into R IJ. No Ptx. Catheter pulled.

## 2016-11-15 NOTE — Progress Notes (Signed)
Lidocaine pulled from 6E pyxis for Dr. Rennis Harding to use for hd catheter placement.

## 2016-11-15 NOTE — Progress Notes (Signed)
The patient has converted from A-fib to NSR. EKG taken. Strip saved in epic for viewing with a <3sec pause during rhythm change. I will continue to monitor the patient closely.   Saddie Benders RN

## 2016-11-15 NOTE — Progress Notes (Signed)
Progress Note  Patient Name: [name] Date of Encounter: 11/15/2016  Primary Cardiologist: Dr. Martinique   Subjective   Continued short of breath, currently on the BiPAP. Did have chest pressure this morning.  Inpatient Medications    Scheduled Meds: . allopurinol  50 mg Oral Daily  . aspirin  81 mg Oral QODAY  . clopidogrel  75 mg Oral Daily  . heparin  40 Units/kg Dialysis Once in dialysis  . insulin aspart  0-5 Units Subcutaneous QHS  . insulin aspart  0-9 Units Subcutaneous TID WC  . metoprolol succinate  25 mg Oral Daily  . multivitamin  1 tablet Oral QHS  . mupirocin ointment   Nasal BID   Continuous Infusions: . sodium chloride    . sodium chloride    . amiodarone 16.7 mg/hr (11/15/16 0113)  . dialysis solution 2.5% low-MG/low-CA    . furosemide    . heparin 1,000 Units/hr (11/15/16 0114)  . nitroGLYCERIN Stopped (11/14/16 0234)  . piperacillin-tazobactam (ZOSYN)  IV Stopped (11/15/16 0559)   PRN Meds: sodium chloride, sodium chloride, acetaminophen **OR** acetaminophen, albuterol, alteplase, dianeal solution for CAPD/CCPD with heparin, heparin, lidocaine (PF), lidocaine-prilocaine, morphine injection, ondansetron (ZOFRAN) IV, pentafluoroprop-tetrafluoroeth, polyvinyl alcohol, traMADol   Vital Signs    Vitals:   11/15/16 0500 11/15/16 0610 11/15/16 0817 11/15/16 0850  BP: (!) 110/59 97/67 124/76   Pulse: 70 (!) 102 (!) 111 68  Resp: (!) 31 (!) 30 (!) 32 (!) 28  Temp: 98.3 F (36.8 C) 97.9 F (36.6 C)    TempSrc: Oral Oral    SpO2: 90%  97% 98%  Weight: 182 lb 11.2 oz (82.9 kg)     Height:        Intake/Output Summary (Last 24 hours) at 11/15/16 1031 Last data filed at 11/15/16 0900  Gross per 24 hour  Intake          2540.46 ml  Output             7200 ml  Net         -4659.54 ml   Filed Weights   11/13/16 0413 11/14/16 0145 11/15/16 0500  Weight: 180 lb 6.4 oz (81.8 kg) 180 lb 12.4 oz (82 kg) 182 lb 11.2 oz (82.9 kg)    Telemetry      Atrial fibrillation.- Personally Reviewed  ECG    Atrial fibrillation, rate 103, right bundle branch block, left anterior fascicular block - Personally Reviewed  Physical Exam   VS:  BP 124/76 (BP Location: Left Arm)   Pulse 68   Temp 97.9 F (36.6 C) (Oral)   Resp (!) 28   Ht 5\' 4"  (1.626 m)   Wt 182 lb 11.2 oz (82.9 kg)   SpO2 98%   BMI 31.36 kg/m  , BMI Body mass index is 31.36 kg/m. GEN: chronically ill-appearing on BiPAP  HEENT: normal  Neck: no carotid bruits, or masses Cardiac: iRRR; no murmurs, rubs, or gallops,no edema  Respiratory:  clear to auscultation bilaterally, normal work of breathing GI: soft, nontender, nondistended, + BS MS: no deformity or atrophy  Skin: warm and dry Neuro:  Strength and sensation are intact Psych: euthymic mood, full affect    Labs    Chemistry  Recent Labs Lab 11/14/16 0547 11/14/16 1209 11/15/16 0022 11/15/16 0456  NA 141  --  138 140  K 4.3  --  4.3 4.1  CL 101  --  98* 97*  CO2 24  --  25  26  GLUCOSE 201*  --  100* 159*  BUN 107*  --  101* 104*  CREATININE 5.29*  --  5.40* 5.70*  CALCIUM 9.2  --  9.0 9.1  PROT  --  6.1*  --   --   ALBUMIN  --  2.7*  --   --   AST  --  20  --   --   ALT  --  10*  --   --   ALKPHOS  --  90  --   --   BILITOT  --  0.9  --   --   GFRNONAA 9*  --  9* 8*  GFRAA 10*  --  10* 9*  ANIONGAP 16*  --  15 17*     Hematology  Recent Labs Lab 11/15/16 0022 11/15/16 0456 11/15/16 0947  WBC 27.3* 24.2* 21.5*  RBC 2.74* 2.54* 2.60*  HGB 8.7* 7.9* 8.1*  HCT 26.6* 24.5* 25.3*  MCV 97.1 96.5 97.3  MCH 31.8 31.1 31.2  MCHC 32.7 32.2 32.0  RDW 15.8* 15.7* 16.0*  PLT 332 307 307    Cardiac Enzymes  Recent Labs Lab 11/12/16 0037 11/12/16 0436 11/12/16 1559  TROPONINI 1.16* 1.74* 1.96*     Recent Labs Lab 11/11/16 2233  TROPIPOC 0.62*     BNP  Recent Labs Lab 11/11/16 2239  BNP 2,178.5*     DDimer No results for input(s): DDIMER in the last 168 hours.    Radiology    Dg Chest Port 1 View  Result Date: 11/15/2016 CLINICAL DATA:  Kidney EXAM: PORTABLE CHEST 1 VIEW COMPARISON:  Chest x-ray dated 11/13/2016 and chest x-ray dated 10/31/2016. FINDINGS: Cardiomegaly is stable. Atherosclerotic changes noted at the aortic arch. Lungs are clear, questionable mild atelectasis at each lung base. No pleural effusion or pneumothorax seen. No acute or suspicious osseous finding. Fixation hardware again noted within the cervical and thoracic spine. Extensive degenerative changes again noted at each shoulder. IMPRESSION: No active disease.  No evidence of pneumonia or pulmonary edema. Stable cardiomegaly. Aortic atherosclerosis. Electronically Signed   By: Franki Cabot M.D.   On: 11/15/2016 09:49    Cardiac Studies   Echo 11/14/16:  Study Conclusions  - Left ventricle: The cavity size was mildly dilated. Wall   thickness was normal. Systolic function was severely reduced. The   estimated ejection fraction was in the range of 25% to 30%.   Diffuse hypokinesis. There is akinesis of the anterior and apical   myocardium. Doppler parameters are consistent with restrictive   physiology, indicative of decreased left ventricular diastolic   compliance and/or increased left atrial pressure. Doppler   parameters are consistent with high ventricular filling pressure. - Mitral valve: Calcified annulus. There was mild to moderate   regurgitation. - Right ventricle: The cavity size was mildly dilated.  Impressions:  - Definity used; Diffuse hypokinesis with akinesis of the anterior   wall and apex; overall severely reduced LV systolic function;   restrictive filling; mild LVE; mild to moderate MR; mild RVE.  Patient Profile     [name] is an 46M with CAD s/p medically managed STEMI, and CKD V here with acute on chronic systolic and diastolic heart failure and possible HCAP.  Assessment & Plan    # Acute on chronic systolic and diastolic heart  failure: ejection fraction reduced to 20-25% with akinesis in the anterior and apical myocardium. This significantly reduced from his most recent echo in 2015. He is volume overloaded and  there is planned for a catheterization placement today.  # NSTEMI: Troponin elevated. Currently on heparin, though has had some chest pressure as well.Moncerrath Berhe need left heart catheterization as current issues improved.  # Paroxysmal atrial fibrillation: Currently in atrial fibrillation and then noted intermittently this hospitalization. Continue heparin and amiodarone.  # CDK V: starting dialysis  For questions or updates, please contact [facility] Please consult [email or web address] for contact info under Cardiology/STEMI.      Signed, Thessaly Mccullers Meredith Leeds, MD  11/15/2016, 10:31 AM

## 2016-11-16 DIAGNOSIS — I214 Non-ST elevation (NSTEMI) myocardial infarction: Principal | ICD-10-CM

## 2016-11-16 LAB — GLUCOSE, CAPILLARY
GLUCOSE-CAPILLARY: 99 mg/dL (ref 65–99)
Glucose-Capillary: 82 mg/dL (ref 65–99)
Glucose-Capillary: 116 mg/dL — ABNORMAL HIGH (ref 65–99)
Glucose-Capillary: 120 mg/dL — ABNORMAL HIGH (ref 65–99)

## 2016-11-16 LAB — HCV COMMENT:

## 2016-11-16 LAB — RENAL FUNCTION PANEL
ALBUMIN: 2.2 g/dL — AB (ref 3.5–5.0)
Anion gap: 13 (ref 5–15)
BUN: 50 mg/dL — AB (ref 6–20)
CALCIUM: 8.3 mg/dL — AB (ref 8.9–10.3)
CO2: 25 mmol/L (ref 22–32)
Chloride: 100 mmol/L — ABNORMAL LOW (ref 101–111)
Creatinine, Ser: 3.64 mg/dL — ABNORMAL HIGH (ref 0.61–1.24)
GFR calc Af Amer: 16 mL/min — ABNORMAL LOW (ref >60)
GFR calc non Af Amer: 14 mL/min — ABNORMAL LOW (ref >60)
Glucose, Bld: 113 mg/dL — ABNORMAL HIGH (ref 65–99)
PHOSPHORUS: 5.6 mg/dL — AB (ref 2.5–4.6)
Potassium: 4.2 mmol/L (ref 3.5–5.1)
SODIUM: 138 mmol/L (ref 135–145)

## 2016-11-16 LAB — CBC
HCT: 23.4 % — ABNORMAL LOW (ref 39.0–52.0)
Hemoglobin: 7.5 g/dL — ABNORMAL LOW (ref 13.0–17.0)
MCH: 31.1 pg (ref 26.0–34.0)
MCHC: 32.1 g/dL (ref 30.0–36.0)
MCV: 97.1 fL (ref 78.0–100.0)
PLATELETS: 291 10*3/uL (ref 150–400)
RBC: 2.41 MIL/uL — ABNORMAL LOW (ref 4.22–5.81)
RDW: 16.1 % — AB (ref 11.5–15.5)
WBC: 15.9 10*3/uL — ABNORMAL HIGH (ref 4.0–10.5)

## 2016-11-16 LAB — OCCULT BLOOD X 1 CARD TO LAB, STOOL: Fecal Occult Bld: POSITIVE — AB

## 2016-11-16 LAB — HEPARIN LEVEL (UNFRACTIONATED): HEPARIN UNFRACTIONATED: 0.54 [IU]/mL (ref 0.30–0.70)

## 2016-11-16 LAB — HEPATITIS B SURFACE ANTIBODY,QUALITATIVE: HEP B S AB: NONREACTIVE

## 2016-11-16 LAB — HEPATITIS B SURFACE ANTIGEN: Hepatitis B Surface Ag: NEGATIVE

## 2016-11-16 LAB — AEROBIC CULTURE  (SUPERFICIAL SPECIMEN)

## 2016-11-16 LAB — PREPARE RBC (CROSSMATCH)

## 2016-11-16 LAB — AEROBIC CULTURE W GRAM STAIN (SUPERFICIAL SPECIMEN): Culture: NO GROWTH

## 2016-11-16 LAB — HEPATITIS C ANTIBODY (REFLEX)

## 2016-11-16 MED ORDER — SODIUM CHLORIDE 0.9% FLUSH: 10.0000 mL | INTRAVENOUS | Status: DC | PRN

## 2016-11-16 MED ORDER — SODIUM CHLORIDE 0.9 % IV SOLN
125.0000 mg | INTRAVENOUS | Status: DC
  Filled 2016-11-16 (×2): qty 10

## 2016-11-16 MED ORDER — DARBEPOETIN ALFA 200 MCG/0.4ML IJ SOSY
200.0000 ug | PREFILLED_SYRINGE | INTRAMUSCULAR | Status: DC
  Administered 2016-11-17: 200 ug via INTRAVENOUS

## 2016-11-16 MED ORDER — PIPERACILLIN-TAZOBACTAM 3.375 G IVPB
3.3750 g | Freq: Two times a day (BID) | INTRAVENOUS | Status: DC
  Administered 2016-11-16 – 2016-11-19 (×5): 3.375 g via INTRAVENOUS
  Filled 2016-11-16 (×7): qty 50

## 2016-11-16 MED ORDER — CALCITRIOL 0.5 MCG PO CAPS
0.5000 ug | ORAL_CAPSULE | ORAL | Status: DC
  Administered 2016-11-16 – 2016-11-18 (×2): 0.5 ug via ORAL
  Filled 2016-11-16 (×2): qty 1

## 2016-11-16 MED ORDER — SODIUM CHLORIDE 0.9 % IV SOLN
Freq: Once | INTRAVENOUS | Status: AC
  Administered 2016-11-17: 12:00:00 via INTRAVENOUS

## 2016-11-16 MED ORDER — SODIUM CHLORIDE 0.9 % IV SOLN: Freq: Once | INTRAVENOUS | Status: AC

## 2016-11-16 NOTE — Progress Notes (Signed)
Pharmacy Antibiotic Note  [name] is a 83 y.o. male admitted on 11/11/2016 with possible pneumonia. Initial CXR concerning for PNA, WBC 15.9. Currently afebrile. Pharmacy has been consulted for vancomycin and Zosyn dosing. ESRD previously on PD, now with temporary catheter on HD.  Plan: S/p vancomycin 1500mg  IV x1 on 10/24 Vancomycin 1000mg  IV q48h ordered, given x1 on 10/26 Timed vancomycin orders d/c'd and will check levels q3-4 days for re-dosing Increase Zosyn to 3.375g IV q12h over 4 hours as recommended for HD F/u HD plans/schedule, cultures and clinical status for de-escalation  Height: 5\' 4"  (162.6 cm) Weight: 174 lb 9.7 oz (79.2 kg) IBW/kg (Calculated) : 59.2  Temp (24hrs), Avg:98.9 F (37.2 C), Min:97.8 F (36.6 C), Max:99.4 F (37.4 C)   Recent Labs Lab 11/13/16 0855 11/14/16 0547 11/15/16 0022 11/15/16 0456 11/15/16 0947 11/16/16 1008  WBC 18.6*  --  27.3* 24.2* 21.5* 15.9*  CREATININE 5.09* 5.29* 5.40* 5.70* 5.77* 3.64*    Estimated Creatinine Clearance: 14.1 mL/min (A) (by C-G formula based on SCr of 3.64 mg/dL (H)).    Allergies  Allergen Reactions  . Gentamycin [Gentamicin] Other (See Comments)    Decreased kidney fx  . Crestor [Rosuvastatin Calcium] Other (See Comments)    MUSCLE PAIN  . Hctz [Hydrochlorothiazide] Other (See Comments)    dizziness  . Lipitor [Atorvastatin Calcium] Other (See Comments)    MUSCLE PAIN    Antimicrobials this admission: 10/24 vancomycin >>  10/24 Zosyn >>   Dose adjustments this admission: None  Microbiology results: 10/24 BCx x2: NGx3 10/25 MRSA PCR: neg 10/26 peritoneal fluid gram stain: negative 10/26 peritoneal fluid cx: NGx2 10/26 peritoneal fluid aerobic cx: NG<24h  Thank you for allowing pharmacy to be a part of this patient's care.  Mila Merry Gerarda Fraction, PharmD PGY1 Pharmacy Resident Pager: [phone] 11/16/2016 1:57 PM

## 2016-11-16 NOTE — Progress Notes (Signed)
HD tx completed w/ bp issues that took a while to resolve after NS 238m boluses x2/UF off/and trendelenberg, MD aware and gave orders to keep pt even (as to what already pulled off at this point) and to keep sbp>80, original UF goal of course not met and ended up getting off 10033m blood rinsed back, report called to LaMeribeth MattesRN

## 2016-11-16 NOTE — Plan of Care (Signed)
Problem: Education: Goal: Knowledge of [facility] Education information/materials will improve Outcome: Progressing Education provided to daughters and wife, pt oriented only to self, family members are all involved in pt care.  Problem: Pain Managment: Goal: General experience of comfort will improve Outcome: Progressing Respiratory rate, heart rate within normal limits, denies pain or discomfort.

## 2016-11-16 NOTE — Progress Notes (Signed)
Subjective: Interval History: has no complaint, alittle better.  Objective: Vital signs in last 24 hours: Temp:  [97.8 F (36.6 C)-99.4 F (37.4 C)] 99.1 F (37.3 C) (10/28 0423) Pulse Rate:  [50-75] 67 (10/28 0653) Resp:  [19-27] 26 (10/28 0653) BP: (71-134)/(38-85) 111/73 (10/28 0653) SpO2:  [97 %-100 %] 100 % (10/28 0653) FiO2 (%):  [40 %] 40 % (10/27 2058) Weight:  [79.2 kg (174 lb 9.7 oz)-80.2 kg (176 lb 12.9 oz)] 79.2 kg (174 lb 9.7 oz) (10/28 0332) Weight change: -2.672 kg (-5 lb 14.3 oz)  Intake/Output from previous day: 10/27 0701 - 10/28 0700 In: 2066 [IV Piggyback:66] Out: 3307  Intake/Output this shift: No intake/output data recorded.  General appearance: cooperative, moderately obese, pale, slowed mentation and incoherent,  answers some questions, more alert Resp: diminished breath sounds bilaterally and rales bibasilar Chest wall: L CS cath Cardio: S1, S2 normal and systolic murmur: systolic ejection 2/6, decrescendo at 2nd left intercostal space GI: obese, pos bs, PD cath Extremities: edema 2+  Lab Results:  Recent Labs  11/15/16 0947 11/16/16 1008  WBC 21.5* 15.9*  HGB 8.1* 7.5*  HCT 25.3* 23.4*  PLT 307 291   BMET:  Recent Labs  11/15/16 0456 11/15/16 0947  NA 140 137  K 4.1 3.9  CL 97* 97*  CO2 26 24  GLUCOSE 159* 284*  BUN 104* 102*  CREATININE 5.70* 5.77*  CALCIUM 9.1 8.9    Recent Labs  11/13/16 1449  PTH 224*   Iron Studies:  Recent Labs  11/13/16 1449  IRON 40*  TIBC 210*    Studies/Results: Dg Chest Port 1 View  Result Date: 11/15/2016 CLINICAL DATA:  Central line placement. EXAM: PORTABLE CHEST 1 VIEW COMPARISON:  11/15/2016 FINDINGS: Interval placement of left subclavian central venous catheter with tip over the SVC. Lungs are hypoinflated with mild hazy opacification right base unchanged likely mild residual vascular congestion. No pneumothorax. Mild stable cardiomegaly. Calcified plaque over the aortic arch.  Hardware intact over the cervical spine and thoracolumbar spine. Remainder of the exam is unchanged. IMPRESSION: Hypoinflation with stable hazy right basilar opacification likely mild residual vascular congestion, although cannot exclude atelectasis or infection. Left subclavian central venous catheter with tip over the SVC. No pneumothorax. Electronically Signed   By: Marin Olp M.D.   On: 11/15/2016 21:13   Dg Chest Port 1 View  Result Date: 11/15/2016 CLINICAL DATA:  Acute respiratory failure EXAM: PORTABLE CHEST 1 VIEW COMPARISON:  11/15/2016 FINDINGS: Post- operative changes of the cervical spine. Low lung volumes. Hazy bibasilar atelectasis or infiltrates. Cardiomegaly with central vascular congestion. Aortic atherosclerosis. No pneumothorax. IMPRESSION: 1. Cardiomegaly with mild central vascular congestion 2. Hazy bibasilar atelectasis or small infiltrates. Cannot exclude small pleural effusions. Electronically Signed   By: Donavan Foil M.D.   On: 11/15/2016 18:59   Dg Chest Port 1 View  Result Date: 11/15/2016 CLINICAL DATA:  Kidney EXAM: PORTABLE CHEST 1 VIEW COMPARISON:  Chest x-ray dated 11/13/2016 and chest x-ray dated 10/31/2016. FINDINGS: Cardiomegaly is stable. Atherosclerotic changes noted at the aortic arch. Lungs are clear, questionable mild atelectasis at each lung base. No pleural effusion or pneumothorax seen. No acute or suspicious osseous finding. Fixation hardware again noted within the cervical and thoracic spine. Extensive degenerative changes again noted at each shoulder. IMPRESSION: No active disease.  No evidence of pneumonia or pulmonary edema. Stable cardiomegaly. Aortic atherosclerosis. Electronically Signed   By: Franki Cabot M.D.   On: 11/15/2016 09:49    I  have reviewed the patient's current medications.  Assessment/Plan: 1 CKD5 more alert, low bps limit vol off.  Will do HD in am and will need access if stabilizes 2 Dm controlled 3 Anemia use Fe, esa 4   HPTH 5 Enceph better, ?? Chronic component 6 Obesity 7 CAD/MI per cards P HD, esa, Fe, vit D    LOS: 4 days   Kenaz Olafson L 11/16/2016,11:12 AM

## 2016-11-16 NOTE — Progress Notes (Signed)
ANTICOAGULATION CONSULT NOTE - Follow Up Consult  Pharmacy Consult for Heparin Indication: chest pain/ACS  Allergies  Allergen Reactions  . Gentamycin [Gentamicin] Other (See Comments)    Decreased kidney fx  . Crestor [Rosuvastatin Calcium] Other (See Comments)    MUSCLE PAIN  . Hctz [Hydrochlorothiazide] Other (See Comments)    dizziness  . Lipitor [Atorvastatin Calcium] Other (See Comments)    MUSCLE PAIN    Patient Measurements: Height: 5\' 4"  (162.6 cm) Weight: 174 lb 9.7 oz (79.2 kg) IBW/kg (Calculated) : 59.2 Heparin Dosing Weight:  76.4 kg  Vital Signs: Temp: 99.1 F (37.3 C) (10/28 0423) Temp Source: Axillary (10/28 0423) BP: 111/73 (10/28 0653) Pulse Rate: 67 (10/28 0653)  Labs:  Recent Labs  11/15/16 0456 11/15/16 0947 11/15/16 1243 11/16/16 1008  HGB 7.9* 8.1*  --  7.5*  HCT 24.5* 25.3*  --  23.4*  PLT 307 307  --  291  APTT  --   --  34  --   HEPARINUNFRC 0.31  --  <0.10* 0.54  CREATININE 5.70* 5.77*  --  3.64*    Estimated Creatinine Clearance: 14.1 mL/min (A) (by C-G formula based on SCr of 3.64 mg/dL (H)).  Assessment: 89 yoM presenting with NSTEMI, continues on heparin for anticoagulation. Heparin was stopped from 1043 until 2122 (~11 hrs) on 10/27 for delayed placement of an HD catheter. 0500 heparin level delayed, drawn at 1000 and is within goal at 0.54. Hgb downtrending, now 7.5. Nephrology to start Aranesp, Nulecit tomorrow 10/29. Pltc stable WNL. No bleeding noted.  Goal of Therapy:  Heparin level 0.3-0.7 units/ml Monitor platelets by anticoagulation protocol: Yes   Plan:  Continue IV heparin at 1000 units/hr Daily heparin level and CBC Monitor for s/sx of bleeding  [name], PharmD PGY1 Pharmacy Resident Pager: [phone] 11/16/2016,11:17 AM

## 2016-11-16 NOTE — Progress Notes (Signed)
HD tx initiated via HD cath w/o problem, pull/push/flush equally w/o problem, VSS w/ soft bp, will cont to monitor while on HD tx 

## 2016-11-16 NOTE — Evaluation (Signed)
Clinical/Bedside Swallow Evaluation Patient Details  Name: [name] MRN: [id number] Date of Birth: [birth year]  Today's Date: 11/16/2016 Time: SLP Start Time (ACUTE ONLY): 27 SLP Stop Time (ACUTE ONLY): 4235 SLP Time Calculation (min) (ACUTE ONLY): 15 min  Past Medical History:  Past Medical History:  Diagnosis Date  . Acute respiratory failure with hypoxia ([place]) 03/03/2013  . Asthma   . CAD (coronary artery disease)- MI in '09 02/28/2013  . Cancer ([place])    lymphoma  1979    . CHF (congestive heart failure) ([place])    2015  . Chronic renal insufficiency    creatinine ranges around 2.8  . Complication of anesthesia    No anesthesia complications, but concerns about neck positioning due to cervical disease  . DOE (dyspnea on exertion)   . Dyspnea    upon exertion  . Gout   . HCAP (healthcare-associated pneumonia) 03/03/2013  . Herpes zoster 12/2009   left eye ..........last flare up "a long time"  . History of BPH   . HOH (hard of hearing)    hears well out of left ear  . Hypertension   . IHD (ischemic heart disease)    Remote MI in 2009 with late presentation; no reperfusion. Has large area of infarct in the apical anterior area on last nuclear in 2011. Managed medically  . Influenza A 03/03/2013  . MI, old 03/2007   ACUTE ANTEROSEPTAL  . NSTEMI (non-ST elevated myocardial infarction)- type 2 02/28/2013  . OA (osteoarthritis) of knee    Left knee with revision and replacement of prosthetic L TKR  . Peripheral edema   . RBBB (right bundle branch block)   . Renal insufficiency   . Staph infection    states that gentamycin & vancomycin, (30 - 45 days worth) are what did his kidneys in, per his daughter   Past Surgical History:  Past Surgical History:  Procedure Laterality Date  . BACK SURGERY     has had 3 surgeries on his back  . BILATERAL CARPAL TUNNEL RELEASE    . BUNIONECTOMY    . CAPD INSERTION N/A 10/30/2016   Procedure: LAPAROSCOPIC INSERTION CONTINUOUS  AMBULATORY PERITONEAL DIALYSIS  (CAPD) CATHETER   OMENTOPEXY;  Surgeon: Michael Boston, MD;  Location: [facility];  Service: General;  Laterality: N/A;  . CARDIAC CATHETERIZATION  03/24/2007  . CARDIOVASCULAR STRESS TEST  04/02/2009   EF 27% AND A LARGE AREA OF INFARCT IN THE APICAL ANTERIOR WITH MILD PERI-INFARCT ISCHEMIA  . FRACTURE SURGERY     both ankles  . INSERTION OF DIALYSIS CATHETER  10/30/2016   LAPAROSCOPIC INSERTION CONTINUOUS AMBULATORY PERITONEAL DIALYSIS  (CAPD) CATHETER   OMENTOPEXY  . JOINT REPLACEMENT     3 on right hip, 2 on knee  . KNEE SURGERY  1998   left knee  . neck surgeries (x2)    . TOE AMPUTATION     LEFT FOOT  . TOTAL KNEE ARTHROPLASTY     left knee  . TRANSTHORACIC ECHOCARDIOGRAM  04/04/2009   EF 45-50%  . TRANSURETHRAL RESECTION OF PROSTATE     HPI:  83 y.o.malewith medical history significant for medically managed coronary artery disease, chronic combined systolic/diastolic CHF, hypertension, and chronic kidney disease stage IV-V, who presented to the emergency department for evaluation of chest discomfort and dyspnea. Admitted for NSTEMI - type 2, with possible pneumonia. Initial CXR concerning for PNA, WBC 15.9.   Assessment / Plan / Recommendation Clinical Impression  Patient presents with moderate risk for  aspiration, primarily due to decreased respiratory status as well as altered mentation. Pt is alert, following basic commands with extended time, verbal cues and occasional demonstration. Noted reduced vocal intensity. At baseline pt's respirations are fluctuating between mid 20s to mid 30s; pt required bipap yesterday but has been on Monserrate today. Administered a limited assessment due to pt's unstable respiratory rate. When respirations below 30, pt appears to have adequate airway protection with single sips of thin liquids, purees. Consecutive swallows resulted in immediate coughing/throat clearing, and pt's respiratory rate increased to 34, suggestive of  decreased airway protection. Spoke with family, RN and advised to hold PO when RR above 30 due to difficulties coordinating swallowing/respiration. Downgraded to dys 1 (puree), thin liquids to limit time needed for bolus preparation. Recommend full supervision for all PO to monitor pt's respiratory status and tolerance. If coughing with intake, please hold PO and page SLP. Will follow up next date for tolerance, possible instrumental assessment warranted.   SLP Visit Diagnosis: Dysphagia, unspecified (R13.10)    Aspiration Risk  Moderate aspiration risk    Diet Recommendation Dysphagia 1 (Puree);Thin liquid   Liquid Administration via: Cup;Straw Medication Administration: Crushed with puree Supervision: Staff to assist with self feeding;Full supervision/cueing for compensatory strategies Compensations: Slow rate;Small sips/bites (Hold PO if RR above 30) Postural Changes: Seated upright at 90 degrees    Other  Recommendations Oral Care Recommendations: Oral care BID   Follow up Recommendations Other (comment) (tba)      Frequency and Duration min 2x/week  2 weeks       Prognosis Prognosis for Safe Diet Advancement: Good Barriers to Reach Goals: Other (Comment) (comorbidities, respiratory status)      Swallow Study   General Date of Onset: 11/11/16 HPI: 83 y.o.malewith medical history significant for medically managed coronary artery disease, chronic combined systolic/diastolic CHF, hypertension, and chronic kidney disease stage IV-V, who presented to the emergency department for evaluation of chest discomfort and dyspnea. Admitted for NSTEMI - type 2, with possible pneumonia. Initial CXR concerning for PNA, WBC 15.9. Type of Study: Bedside Swallow Evaluation Previous Swallow Assessment: none in chart Diet Prior to this Study: Regular;Thin liquids Temperature Spikes Noted: No Respiratory Status: Nasal cannula History of Recent Intubation: No Behavior/Cognition:  Alert;Confused;Requires cueing;Distractible Oral Cavity Assessment: Within Functional Limits Oral Care Completed by SLP: No Oral Cavity - Dentition: Adequate natural dentition Vision: Functional for self-feeding Self-Feeding Abilities: Needs assist Patient Positioning: Upright in bed Baseline Vocal Quality: Low vocal intensity Volitional Cough: Cognitively unable to elicit Volitional Swallow: Unable to elicit    Oral/Motor/Sensory Function Overall Oral Motor/Sensory Function: Within functional limits   Ice Chips Ice chips: Within functional limits   Thin Liquid Thin Liquid: Impaired Presentation: Cup;Straw Pharyngeal  Phase Impairments: Throat Clearing - Immediate;Cough - Immediate    Nectar Thick Nectar Thick Liquid: Not tested   Honey Thick Honey Thick Liquid: Not tested   Puree Puree: Within functional limits Presentation: Spoon   Solid   GO   Solid: Not tested       Deneise Lever, [place], [place] Speech-Language Pathologist [phone]  Aliene Altes 11/16/2016,4:26 PM

## 2016-11-16 NOTE — Progress Notes (Signed)
Pt with increase work of breathing, resp rate 30s, accessory muscle use noted, faint crackles, mental status changes, less responsive to staff and family, more lethargic, Dr Wendee Beavers notified, RT notified, placed on bi-pap, rapid response consult.  Edward Qualia RN

## 2016-11-16 NOTE — Progress Notes (Signed)
Pt c/o sob and difficulty breathing, diuretic not given earlier due to BP issue. Pt placed on bipap due to increased RR and resp status

## 2016-11-16 NOTE — Progress Notes (Signed)
PROGRESS NOTE    [name]  OEV:[id number] DOB: Dec 23, 1928 DOA: 11/11/2016 PCP: Celene Squibb, MD    Brief Narrative:  83 y.o. male with medical history significant for medically managed coronary artery disease, chronic combined systolic/diastolic CHF, hypertension, and chronic kidney disease stage IV-V, now presenting to the emergency department for evaluation of chest discomfort and dyspnea.    Assessment & Plan:   Principal Problem:   NSTEMI (non-ST elevated myocardial infarction)- type 2 - Cardiology consulted and managing - pt is on heparin gtt - heart cath once patient is more stable.  Active Problems:   CKD (chronic kidney disease), stage V [facility]) - Nephrology consulted and assisting with management patient started PD dialysis sessions and had temporary cath placed for further dialysis.  Anemia - suspect this is chronic problem and with worsening edema I currently suspect patient may have dilutional effect - Pt may require transfusion, but for now iron and esa    Acute on chronic systolic and diastolic heart failure, NYHA class 4 (HCC) - pt on metoprolol and lasix. Not much response to lasix per my discussion with nursing - Will obtain chest x ray. Last one reported findings suspicious for fluid overloading.    Leukocytosis/suspected pna. - Pt is currently on Zosyn. Concern was that initial chest x-ray reported finding suspicious for pneumonia. Awaiting blood cultures and sputum cultures    Hyperglycemia - Hgb a1c is 6.0 - continue SSI while in house. On d/c would treat with diet control  DVT prophylaxis: Pt is on heparin gtt Code Status: Full Family Communication: None at bedside.  Disposition Plan: pending further recommendations by specialist   Consultants:   Nephrology  Cardiology   Procedures: none   Antimicrobials: none   Subjective: No new complaints, no acute issues overnight.  Objective: Vitals:   11/16/16 0532 11/16/16 0653 11/16/16  1208 11/16/16 1247  BP: 100/85 111/73 (!) 111/47   Pulse: 64 67 63   Resp: (!) 23 (!) 26 (!) 27   Temp:   98.9 F (37.2 C)   TempSrc:   Oral   SpO2: 100% 100% 100% 100%  Weight:      Height:        Intake/Output Summary (Last 24 hours) at 11/16/16 1519 Last data filed at 11/16/16 0400  Gross per 24 hour  Intake                0 ml  Output             1007 ml  Net            -1007 ml   Filed Weights   11/15/16 0500 11/16/16 0017 11/16/16 0332  Weight: 82.9 kg (182 lb 11.2 oz) 80.2 kg (176 lb 12.9 oz) 79.2 kg (174 lb 9.7 oz)    Examination:   General exam: Pt in nad, alert and awake Respiratory system: No wheeze, no rhales, equal chest rise.  Cardiovascular system: S1 & S2 heard, RRR. No murmurs, equal chest rise. Gastrointestinal system: Abdomen is nondistended, soft and nontender. No organomegaly or masses felt. Normal bowel sounds heard. Central nervous system: Alert and oriented. No facial asymmetry Extremities: Symmetric 5 x 5 power. Skin: No rashes, lesions or ulcers, on limited exam. Psychiatry: Mood & affect appropriate.    Data Reviewed: I have personally reviewed following labs and imaging studies  CBC:  Recent Labs Lab 11/13/16 0855 11/15/16 0022 11/15/16 0456 11/15/16 0947 11/16/16 1008  WBC 18.6* 27.3* 24.2* 21.5* 15.9*  HGB 9.6* 8.7* 7.9* 8.1* 7.5*  HCT 29.9* 26.6* 24.5* 25.3* 23.4*  MCV 97.4 97.1 96.5 97.3 97.1  PLT 293 332 307 307 096   Basic Metabolic Panel:  Recent Labs Lab 11/13/16 1449 11/14/16 0547 11/15/16 0022 11/15/16 0456 11/15/16 0947 11/16/16 1008  NA  --  141 138 140 137 138  K  --  4.3 4.3 4.1 3.9 4.2  CL  --  101 98* 97* 97* 100*  CO2  --  24 25 26 24 25   GLUCOSE  --  201* 100* 159* 284* 113*  BUN  --  107* 101* 104* 102* 50*  CREATININE  --  5.29* 5.40* 5.70* 5.77* 3.64*  CALCIUM  --  9.2 9.0 9.1 8.9 8.3*  PHOS 5.4*  --   --   --  7.0* 5.6*   GFR: Estimated Creatinine Clearance: 14.1 mL/min (A) (by C-G formula  based on SCr of 3.64 mg/dL (H)). Liver Function Tests:  Recent Labs Lab 11/14/16 1209 11/15/16 0947 11/16/16 1008  AST 20  --   --   ALT 10*  --   --   ALKPHOS 90  --   --   BILITOT 0.9  --   --   PROT 6.1*  --   --   ALBUMIN 2.7* 2.4* 2.2*   No results for input(s): LIPASE, AMYLASE in the last 168 hours.  Recent Labs Lab 11/13/16 0350  AMMONIA 50*   Coagulation Profile: No results for input(s): INR, PROTIME in the last 168 hours. Cardiac Enzymes:  Recent Labs Lab 11/12/16 0037 11/12/16 0436 11/12/16 1559  TROPONINI 1.16* 1.74* 1.96*   BNP (last 3 results) No results for input(s): PROBNP in the last 8760 hours. HbA1C: No results for input(s): HGBA1C in the last 72 hours. CBG:  Recent Labs Lab 11/15/16 1617 11/15/16 2056 11/16/16 0438 11/16/16 0847 11/16/16 1205  GLUCAP 83 98 82 99 116*   Lipid Profile: No results for input(s): CHOL, HDL, LDLCALC, TRIG, CHOLHDL, LDLDIRECT in the last 72 hours. Thyroid Function Tests: No results for input(s): TSH, T4TOTAL, FREET4, T3FREE, THYROIDAB in the last 72 hours. Anemia Panel: No results for input(s): VITAMINB12, FOLATE, FERRITIN, TIBC, IRON, RETICCTPCT in the last 72 hours. Sepsis Labs:  Recent Labs Lab 11/12/16 0037 11/12/16 0436 11/13/16 0350  PROCALCITON 0.22 0.20 0.25    Recent Results (from the past 240 hour(s))  Culture, blood (routine x 2)     Status: None (Preliminary result)   Collection Time: 11/12/16  6:32 PM  Result Value Ref Range Status   Specimen Description BLOOD RIGHT HAND  Final   Special Requests   Final    BOTTLES DRAWN AEROBIC AND ANAEROBIC Blood Culture adequate volume   Culture NO GROWTH 3 DAYS  Final   Report Status PENDING  Incomplete  Culture, blood (routine x 2)     Status: None (Preliminary result)   Collection Time: 11/12/16  6:42 PM  Result Value Ref Range Status   Specimen Description BLOOD LEFT HAND  Final   Special Requests   Final    BOTTLES DRAWN AEROBIC AND  ANAEROBIC Blood Culture adequate volume   Culture NO GROWTH 3 DAYS  Final   Report Status PENDING  Incomplete  MRSA PCR Screening     Status: None   Collection Time: 11/13/16 10:26 PM  Result Value Ref Range Status   MRSA by PCR NEGATIVE NEGATIVE Final    Comment:        The GeneXpert MRSA Assay (FDA  approved for NASAL specimens only), is one component of a comprehensive MRSA colonization surveillance program. It is not intended to diagnose MRSA infection nor to guide or monitor treatment for MRSA infections.   Stat Gram stain     Status: None   Collection Time: 11/14/16  2:00 AM  Result Value Ref Range Status   Specimen Description FLUID PERITONEAL  Final   Special Requests NONE  Final   Gram Stain   Final    FEW WBC PRESENT, PREDOMINANTLY MONONUCLEAR NO ORGANISMS SEEN    Report Status 11/14/2016 FINAL  Final  Aerobic Culture (superficial specimen)     Status: None   Collection Time: 11/14/16  2:00 AM  Result Value Ref Range Status   Specimen Description FLUID PERITONEAL  Final   Special Requests NONE  Final   Culture NO GROWTH 2 DAYS  Final   Report Status 11/16/2016 FINAL  Final  Culture, body fluid-bottle     Status: None (Preliminary result)   Collection Time: 11/14/16 11:41 AM  Result Value Ref Range Status   Specimen Description PERITONEAL  Final   Special Requests NONE  Final   Culture NO GROWTH < 24 HOURS  Final   Report Status PENDING  Incomplete  Gram stain     Status: None   Collection Time: 11/14/16 11:41 AM  Result Value Ref Range Status   Specimen Description PERITONEAL  Final   Special Requests NONE  Final   Gram Stain   Final    RARE WBC PRESENT, PREDOMINANTLY MONONUCLEAR NO ORGANISMS SEEN    Report Status 11/14/2016 FINAL  Final     Radiology Studies: Dg Chest Port 1 View  Result Date: 11/15/2016 CLINICAL DATA:  Central line placement. EXAM: PORTABLE CHEST 1 VIEW COMPARISON:  11/15/2016 FINDINGS: Interval placement of left subclavian  central venous catheter with tip over the SVC. Lungs are hypoinflated with mild hazy opacification right base unchanged likely mild residual vascular congestion. No pneumothorax. Mild stable cardiomegaly. Calcified plaque over the aortic arch. Hardware intact over the cervical spine and thoracolumbar spine. Remainder of the exam is unchanged. IMPRESSION: Hypoinflation with stable hazy right basilar opacification likely mild residual vascular congestion, although cannot exclude atelectasis or infection. Left subclavian central venous catheter with tip over the SVC. No pneumothorax. Electronically Signed   By: Marin Olp M.D.   On: 11/15/2016 21:13   Dg Chest Port 1 View  Result Date: 11/15/2016 CLINICAL DATA:  Acute respiratory failure EXAM: PORTABLE CHEST 1 VIEW COMPARISON:  11/15/2016 FINDINGS: Post- operative changes of the cervical spine. Low lung volumes. Hazy bibasilar atelectasis or infiltrates. Cardiomegaly with central vascular congestion. Aortic atherosclerosis. No pneumothorax. IMPRESSION: 1. Cardiomegaly with mild central vascular congestion 2. Hazy bibasilar atelectasis or small infiltrates. Cannot exclude small pleural effusions. Electronically Signed   By: Donavan Foil M.D.   On: 11/15/2016 18:59   Dg Chest Port 1 View  Result Date: 11/15/2016 CLINICAL DATA:  Kidney EXAM: PORTABLE CHEST 1 VIEW COMPARISON:  Chest x-ray dated 11/13/2016 and chest x-ray dated 10/31/2016. FINDINGS: Cardiomegaly is stable. Atherosclerotic changes noted at the aortic arch. Lungs are clear, questionable mild atelectasis at each lung base. No pleural effusion or pneumothorax seen. No acute or suspicious osseous finding. Fixation hardware again noted within the cervical and thoracic spine. Extensive degenerative changes again noted at each shoulder. IMPRESSION: No active disease.  No evidence of pneumonia or pulmonary edema. Stable cardiomegaly. Aortic atherosclerosis. Electronically Signed   By: Franki Cabot  M.D.   On:  11/15/2016 09:49   Scheduled Meds: . allopurinol  50 mg Oral Daily  . aspirin  81 mg Oral QODAY  . calcitRIOL  0.5 mcg Oral QODAY  . clopidogrel  75 mg Oral Daily  . [START ON 11/17/2016] darbepoetin (ARANESP) injection - DIALYSIS  200 mcg Intravenous Q Mon-HD  . insulin aspart  0-5 Units Subcutaneous QHS  . insulin aspart  0-9 Units Subcutaneous TID WC  . metoprolol succinate  25 mg Oral Daily  . multivitamin  1 tablet Oral QHS  . mupirocin ointment   Nasal BID   Continuous Infusions: . sodium chloride    . sodium chloride    . amiodarone 30 mg/hr (11/16/16 1444)  . [START ON 11/17/2016] ferric gluconate (FERRLECIT/NULECIT) IV    . heparin 1,000 Units/hr (11/15/16 2122)  . nitroGLYCERIN Stopped (11/14/16 0234)  . piperacillin-tazobactam (ZOSYN)  IV       LOS: 4 days    Time spent: > 35 min  Velvet Bathe, MD [facility] Pager [phone]  If 7PM-7AM, please contact night-coverage [email or web address] Password [email or web address] 11/16/2016, 3:19 PM

## 2016-11-17 ENCOUNTER — Inpatient Hospital Stay (HOSPITAL_COMMUNITY): Payer: Medicare HMO

## 2016-11-17 DIAGNOSIS — N186 End stage renal disease: Secondary | ICD-10-CM

## 2016-11-17 DIAGNOSIS — I480 Paroxysmal atrial fibrillation: Secondary | ICD-10-CM

## 2016-11-17 DIAGNOSIS — D649 Anemia, unspecified: Secondary | ICD-10-CM

## 2016-11-17 DIAGNOSIS — G9340 Encephalopathy, unspecified: Secondary | ICD-10-CM

## 2016-11-17 DIAGNOSIS — K922 Gastrointestinal hemorrhage, unspecified: Secondary | ICD-10-CM

## 2016-11-17 DIAGNOSIS — I4891 Unspecified atrial fibrillation: Secondary | ICD-10-CM

## 2016-11-17 DIAGNOSIS — J9601 Acute respiratory failure with hypoxia: Secondary | ICD-10-CM

## 2016-11-17 DIAGNOSIS — Z992 Dependence on renal dialysis: Secondary | ICD-10-CM

## 2016-11-17 DIAGNOSIS — R0602 Shortness of breath: Secondary | ICD-10-CM

## 2016-11-17 LAB — RENAL FUNCTION PANEL
ANION GAP: 16 — AB (ref 5–15)
Albumin: 2.3 g/dL — ABNORMAL LOW (ref 3.5–5.0)
BUN: 70 mg/dL — ABNORMAL HIGH (ref 6–20)
CALCIUM: 8.7 mg/dL — AB (ref 8.9–10.3)
CHLORIDE: 98 mmol/L — AB (ref 101–111)
CO2: 24 mmol/L (ref 22–32)
CREATININE: 4.66 mg/dL — AB (ref 0.61–1.24)
GFR, EST AFRICAN AMERICAN: 12 mL/min — AB (ref >60)
GFR, EST NON AFRICAN AMERICAN: 10 mL/min — AB (ref >60)
Glucose, Bld: 108 mg/dL — ABNORMAL HIGH (ref 65–99)
Phosphorus: 6.2 mg/dL — ABNORMAL HIGH (ref 2.5–4.6)
Potassium: 4.3 mmol/L (ref 3.5–5.1)
SODIUM: 138 mmol/L (ref 135–145)

## 2016-11-17 LAB — HEPARIN LEVEL (UNFRACTIONATED)
Heparin Unfractionated: 0.28 IU/mL — ABNORMAL LOW (ref 0.30–0.70)
Heparin Unfractionated: >2.2 IU/mL — ABNORMAL HIGH (ref 0.30–0.70)
Heparin Unfractionated: <0.1 IU/mL — ABNORMAL LOW (ref 0.30–0.70)

## 2016-11-17 LAB — CBC WITH DIFFERENTIAL/PLATELET
BASOS ABS: 0.1 10*3/uL (ref 0.0–0.1)
Basophils Relative: 0 %
EOS ABS: 0.2 10*3/uL (ref 0.0–0.7)
EOS PCT: 1 %
HCT: 24.5 % — ABNORMAL LOW (ref 39.0–52.0)
Hemoglobin: 8 g/dL — ABNORMAL LOW (ref 13.0–17.0)
LYMPHS PCT: 8 %
Lymphs Abs: 1.3 10*3/uL (ref 0.7–4.0)
MCH: 31.4 pg (ref 26.0–34.0)
MCHC: 32.7 g/dL (ref 30.0–36.0)
MCV: 96.1 fL (ref 78.0–100.0)
Monocytes Absolute: 1.8 10*3/uL — ABNORMAL HIGH (ref 0.1–1.0)
Monocytes Relative: 11 %
Neutro Abs: 12.3 10*3/uL — ABNORMAL HIGH (ref 1.7–7.7)
Neutrophils Relative %: 80 %
PLATELETS: 310 10*3/uL (ref 150–400)
RBC: 2.55 MIL/uL — AB (ref 4.22–5.81)
RDW: 16.5 % — ABNORMAL HIGH (ref 11.5–15.5)
WBC: 15.6 10*3/uL — AB (ref 4.0–10.5)

## 2016-11-17 LAB — CULTURE, BLOOD (ROUTINE X 2)
CULTURE: NO GROWTH
Culture: NO GROWTH
SPECIAL REQUESTS: ADEQUATE
Special Requests: ADEQUATE

## 2016-11-17 LAB — PROTIME-INR
INR: 1.41
Prothrombin Time: 17.1 seconds — ABNORMAL HIGH (ref 11.4–15.2)

## 2016-11-17 LAB — BLOOD GAS, ARTERIAL
Acid-Base Excess: 1.6 mmol/L (ref 0.0–2.0)
BICARBONATE: 25.8 mmol/L (ref 20.0–28.0)
DRAWN BY: 441371
O2 Content: 2 L/min
O2 Saturation: 97.3 %
PATIENT TEMPERATURE: 99.2
pCO2 arterial: 42.1 mmHg (ref 32.0–48.0)
pH, Arterial: 7.406 (ref 7.350–7.450)
pO2, Arterial: 93 mmHg (ref 83.0–108.0)

## 2016-11-17 LAB — GLUCOSE, CAPILLARY
GLUCOSE-CAPILLARY: 110 mg/dL — AB (ref 65–99)
Glucose-Capillary: 114 mg/dL — ABNORMAL HIGH (ref 65–99)
Glucose-Capillary: 107 mg/dL — ABNORMAL HIGH (ref 65–99)
Glucose-Capillary: 127 mg/dL — ABNORMAL HIGH (ref 65–99)

## 2016-11-17 LAB — CBC
HCT: 21.7 % — ABNORMAL LOW (ref 39.0–52.0)
HEMOGLOBIN: 7.1 g/dL — AB (ref 13.0–17.0)
MCH: 31.8 pg (ref 26.0–34.0)
MCHC: 32.7 g/dL (ref 30.0–36.0)
MCV: 97.3 fL (ref 78.0–100.0)
PLATELETS: 307 10*3/uL (ref 150–400)
RBC: 2.23 MIL/uL — AB (ref 4.22–5.81)
RDW: 16.6 % — ABNORMAL HIGH (ref 11.5–15.5)
WBC: 15.2 10*3/uL — AB (ref 4.0–10.5)

## 2016-11-17 LAB — PATHOLOGIST SMEAR REVIEW

## 2016-11-17 LAB — PROCALCITONIN: PROCALCITONIN: 1.52 ng/mL

## 2016-11-17 LAB — TROPONIN I: Troponin I: 0.73 ng/mL (ref <0.03)

## 2016-11-17 LAB — AMMONIA: Ammonia: 26 umol/L (ref 9–35)

## 2016-11-17 LAB — VANCOMYCIN, RANDOM: Vancomycin Rm: 15

## 2016-11-17 LAB — LACTIC ACID, PLASMA
LACTIC ACID, VENOUS: 1.1 mmol/L (ref 0.5–1.9)
LACTIC ACID, VENOUS: 0.9 mmol/L (ref 0.5–1.9)

## 2016-11-17 MED ORDER — VANCOMYCIN HCL IN DEXTROSE 750-5 MG/150ML-% IV SOLN
750.0000 mg | Freq: Once | INTRAVENOUS | Status: AC
  Administered 2016-11-17: 750 mg via INTRAVENOUS
  Filled 2016-11-17: qty 150

## 2016-11-17 MED ORDER — DARBEPOETIN ALFA 200 MCG/0.4ML IJ SOSY
PREFILLED_SYRINGE | INTRAMUSCULAR | Status: AC
  Filled 2016-11-17: qty 0.4

## 2016-11-17 MED ORDER — PANTOPRAZOLE SODIUM 40 MG IV SOLR
40.0000 mg | Freq: Two times a day (BID) | INTRAVENOUS | Status: DC
  Administered 2016-11-17 – 2016-11-19 (×4): 40 mg via INTRAVENOUS
  Filled 2016-11-17 (×4): qty 40

## 2016-11-17 MED ORDER — PANTOPRAZOLE SODIUM 40 MG IV SOLR: 40.0000 mg | Freq: Every day | INTRAVENOUS | Status: DC

## 2016-11-17 MED ORDER — HEPARIN (PORCINE) IN NACL 100-0.45 UNIT/ML-% IJ SOLN
1100.0000 [IU]/h | INTRAMUSCULAR | Status: DC
  Filled 2016-11-17: qty 250

## 2016-11-17 NOTE — Progress Notes (Signed)
Pharmacy Antibiotic Note  [name] is a 83 y.o. male admitted on 11/11/2016 with possible pneumonia. Initial CXR concerning for PNA, WBC 15.9. Currently afebrile. Pharmacy has been consulted for vancomycin and Zosyn dosing. ESRD previously on PD, now with temporary catheter on HD.  Had HD session 10/28 and again 10/29.  Checked random vancomycin level at beginning of HD today = 15.  Plan: Redose vancomycin 750 mg x 1 now. F/u next planned HD for further vancomycin doses. Continue Zosyn to 3.375g IV q12h over 4 hours as recommended for HD F/u HD plans/schedule, cultures and clinical status for de-escalation  Height: 5\' 4"  (162.6 cm) Weight: 174 lb 9.7 oz (79.2 kg) IBW/kg (Calculated) : 59.2  Temp (24hrs), Avg:98.4 F (36.9 C), Min:97.7 F (36.5 C), Max:99.4 F (37.4 C)   Recent Labs Lab 11/15/16 0022 11/15/16 0456 11/15/16 0947 11/16/16 1008 11/17/16 0410 11/17/16 0807  WBC 27.3* 24.2* 21.5* 15.9* 15.2*  --   CREATININE 5.40* 5.70* 5.77* 3.64* 4.66*  --   VANCORANDOM  --   --   --   --   --  15    Estimated Creatinine Clearance: 11 mL/min (A) (by C-G formula based on SCr of 4.66 mg/dL (H)).    Allergies  Allergen Reactions  . Gentamycin [Gentamicin] Other (See Comments)    Decreased kidney fx  . Crestor [Rosuvastatin Calcium] Other (See Comments)    MUSCLE PAIN  . Hctz [Hydrochlorothiazide] Other (See Comments)    dizziness  . Lipitor [Atorvastatin Calcium] Other (See Comments)    MUSCLE PAIN   Antimicrobials this admission:  Vanco 10/24>> Zosyn 10/24>>  Dose adjustments this admission:  10/29 Random vancomycin = 15  Microbiology results:  10/24 BCx x2: NGx3 10/25 MRSA PCR: neg 10/26 peritoneal fluid gram stain: negative 10/26 peritoneal fluid cx: NGx1  Thank you for allowing pharmacy to be a part of this patient's care.  Mila Merry Gerarda Fraction, PharmD PGY1 Pharmacy Resident Pager: [phone] 11/17/2016 10:53 AM

## 2016-11-17 NOTE — Progress Notes (Signed)
RT called to pt's room to placed back on Bipap due to increased RR.  Pt's family states he does not care for the bipap and has been coughing since oral medications given.  RT discussed with RN. MD contacted by RN.  RT set-up Yankauer suctioning and suctioned the back of the patient's throat.  A small amount of clear/white secretions were obtain.  Pt visiting with grandson who just arrived.   Pt's respiratory rate varies from 28 - 40.

## 2016-11-17 NOTE — Progress Notes (Signed)
CRITICAL VALUE ALERT  Critical Value:  Troponin 0.73  Date & Time Notied:  11/17/2016 2100  Provider Notified: Critical Care on Call  Orders Received/Actions taken: No new orders at this time.  Also notified MD r/t CT compleation. MD to follow up.

## 2016-11-17 NOTE — Progress Notes (Signed)
[place] [place] Progress Note Patient Name: [name] DOB: 12-Dec-1928 MRN: [id number]   Date of Service  11/17/2016  HPI/Events of Note  Called back by Radiology who now states that he feels the PD catheter transverses the stomach and is associated with extraluminal free air. PD Catheter placed on 10/30/2016 by Dr. Johney Maine from [facility].  eICU Interventions  Will order: 1. Consult CCS. I have spoken with Dr. Dema Severin who will see the patient.     Intervention Category Major Interventions: Other:  Lysle Dingwall 11/17/2016, 10:45 PM

## 2016-11-17 NOTE — Progress Notes (Signed)
[place] Progress Note Patient Name: [name] DOB: [birth year] MRN: [id number]   Date of Service  11/17/2016  HPI/Events of Note  CT Scan or Abdomen/Pelvis - 11 x 10 x 5 cm hematoma of  R rectus sheath above the umbilicus likely associated with PD catheter.   eICU Interventions  Will order: 1. D/C Heparin IV infusion.  2. D/C ASA. 3. CBC with platelet count now.  4. Follow R rectus sheath hematoma clinically.      Intervention Category Major Interventions: Other:  Lysle Dingwall 11/17/2016, 9:52 PM

## 2016-11-17 NOTE — Progress Notes (Signed)
  [facility] Progress Note   Assessment/ Plan:   1 CKD5-->ESRD more alert, low bps limit vol off.  For HD today.  Has a PD cath, ? If he's appropriate for PD.  Will contact home unit (apparently he hadn't started any type of RRT before this admission). 2 Dm controlled 3 Anemia use Fe, esa 4  HPTH 5 Enceph better, ?? Chronic component 6 Obesity 7 CAD/MI per cards P HD, esa, Fe, vit D  Subjective:    Oriented to self.  Tolerating HD   Objective:   BP (!) 89/51   Pulse (!) 102   Temp 97.7 F (36.5 C) (Oral)   Resp (!) 30   Ht 5\' 4"  (1.626 m)   Wt 79.2 kg (174 lb 9.7 oz)   SpO2 100%   BMI 29.97 kg/m   Physical Exam: General appearance: sleeping but arousable.   Resp: normal WOB, diminished bibasilar breath sounds Chest wall: L IJ nontunneled HD cath Cardio: S1, S2 systolic murmur GI: obese, pos bs, PD cath Extremities: edema 2+  Labs: BMET  Recent Labs Lab 11/13/16 0855 11/13/16 1449 11/14/16 0547 11/15/16 0022 11/15/16 0456 11/15/16 0947 11/16/16 1008 11/17/16 0410  NA 144  --  141 138 140 137 138 138  K 4.7  --  4.3 4.3 4.1 3.9 4.2 4.3  CL 104  --  101 98* 97* 97* 100* 98*  CO2 25  --  24 25 26 24 25 24   GLUCOSE 146*  --  201* 100* 159* 284* 113* 108*  BUN 103*  --  107* 101* 104* 102* 50* 70*  CREATININE 5.09*  --  5.29* 5.40* 5.70* 5.77* 3.64* 4.66*  CALCIUM 9.3  --  9.2 9.0 9.1 8.9 8.3* 8.7*  PHOS  --  5.4*  --   --   --  7.0* 5.6* 6.2*   CBC  Recent Labs Lab 11/15/16 0456 11/15/16 0947 11/16/16 1008 11/17/16 0410  WBC 24.2* 21.5* 15.9* 15.2*  HGB 7.9* 8.1* 7.5* 7.1*  HCT 24.5* 25.3* 23.4* 21.7*  MCV 96.5 97.3 97.1 97.3  PLT 307 307 291 307    @IMGRELPRIORS @ Medications:    . allopurinol  50 mg Oral Daily  . aspirin  81 mg Oral QODAY  . calcitRIOL  0.5 mcg Oral QODAY  . clopidogrel  75 mg Oral Daily  . darbepoetin (ARANESP) injection - DIALYSIS  200 mcg Intravenous Q Mon-HD  . insulin aspart  0-5 Units Subcutaneous  QHS  . insulin aspart  0-9 Units Subcutaneous TID WC  . metoprolol succinate  25 mg Oral Daily  . multivitamin  1 tablet Oral QHS  . mupirocin ointment   Nasal BID     Madelon Lips MD [facility] pgr [phone] 11/17/2016, 9:47 AM

## 2016-11-17 NOTE — NC FL2 (Signed)
[place] LEVEL OF CARE SCREENING TOOL     IDENTIFICATION  Patient Name: TEYON ODETTE Birthdate: [birth year] Sex: male Admission Date (Current Location): 11/11/2016  [facility] and [place] Number:  Herbalist and Address:         Provider Number: [id number]  Attending Physician Name and Address:  Velvet Bathe, MD  Relative Name and Phone Number:  Chantry Headen, spouse, [phone]    Current Level of Care: Hospital Recommended Level of Care: [place] Prior Approval Number:    Date Approved/Denied:   PASRR Number: [id number]  Discharge Plan: SNF    Current Diagnoses: Patient Active Problem List   Diagnosis Date Noted  . Leukocytosis 11/12/2016  . Hyperglycemia 11/12/2016  . Bradycardia   . Diplopia   . Dizziness   . Hypotension due to drugs   . Essential hypertension 02/06/2015  . Moderate persistent asthma in adult without complication 09/62/8366  . Cardiomyopathy, ischemic- EF 45% 2D 02/27/13 03/01/2013  . Mural thrombus of heart-old, no need for anticoagulation 02/28/2013  . Acute on chronic systolic and diastolic heart failure, NYHA class 4 ([place]) 02/28/2013  . NSTEMI (non-ST elevated myocardial infarction)- type 2 02/28/2013  . CKD (chronic kidney disease), stage V ([place]) 02/26/2013  . Benign prostate hyperplasia 11/24/2011  . Osteoarthritis 07/15/2010  . Right bundle branch block 07/15/2010    Orientation RESPIRATION BLADDER Height & Weight     Self  O2 (nasal cannula 3L; bipap, settings below) Incontinent, Indwelling catheter Weight: 173 lb 4.5 oz (78.6 kg) Height:  5\' 4"  (162.6 cm)  BEHAVIORAL SYMPTOMS/MOOD NEUROLOGICAL BOWEL NUTRITION STATUS      Incontinent Diet (please see DC summary)  AMBULATORY STATUS COMMUNICATION OF NEEDS Skin   Extensive Assist Verbally Surgical wounds (closed incision, abdomen; closed incision R leg)                       Personal Care Assistance Level of Assistance  Bathing,  Feeding, Dressing Bathing Assistance: Maximum assistance Feeding assistance: Limited assistance Dressing Assistance: Maximum assistance     Functional Limitations Info  Sight, Hearing, Speech Sight Info: Adequate Hearing Info: Impaired Speech Info: Adequate    SPECIAL CARE FACTORS FREQUENCY  PT (By licensed PT), OT (By licensed OT)     PT Frequency: 5x/week OT Frequency: 5x/week            Contractures Contractures Info: Not present    Additional Factors Info  Code Status, Allergies, Insulin Sliding Scale Code Status Info: Full Allergies Info: Gentamycin Gentamicin, Crestor Rosuvastatin Calcium, Hctz Hydrochlorothiazide, Lipitor Atorvastatin Calcium   Insulin Sliding Scale Info: insulin novolog 3x/day with meals, insulin novolog at bedtime       Current Medications (11/17/2016):  This is the current hospital active medication list Current Facility-Administered Medications  Medication Dose Route Frequency Provider Last Rate Last Dose  . 0.9 %  sodium chloride infusion   Intravenous Once Velvet Bathe, MD      . acetaminophen (TYLENOL) tablet 650 mg  650 mg Oral Q4H PRN Velvet Bathe, MD       Or  . acetaminophen (TYLENOL) suppository 650 mg  650 mg Rectal Q4H PRN Velvet Bathe, MD   650 mg at 11/14/16 1242  . albuterol (PROVENTIL) (2.5 MG/3ML) 0.083% nebulizer solution 2.5 mg  2.5 mg Nebulization Q4H PRN Opyd, Ilene Qua, MD      . allopurinol (ZYLOPRIM) tablet 50 mg  50 mg Oral Daily Opyd, Ilene Qua, MD  50 mg at 11/16/16 0852  . amiodarone (NEXTERONE PREMIX) 360-4.14 MG/200ML-% (1.8 mg/mL) IV infusion  30 mg/hr Intravenous Continuous Fransico Him R, MD 16.7 mL/hr at 11/17/16 0152 30 mg/hr at 11/17/16 0152  . aspirin chewable tablet 81 mg  81 mg Oral QODAY Opyd, Ilene Qua, MD   81 mg at 11/16/16 0851  . calcitRIOL (ROCALTROL) capsule 0.5 mcg  0.5 mcg Oral Nat Math, MD   0.5 mcg at 11/16/16 1343  . clopidogrel (PLAVIX) tablet 75 mg  75 mg Oral Daily Opyd,  Ilene Qua, MD   75 mg at 11/16/16 1300  . Darbepoetin Alfa (ARANESP) 200 MCG/0.4ML injection           . Darbepoetin Alfa (ARANESP) injection 200 mcg  200 mcg Intravenous Q Mon-HD Mauricia Area, MD   200 mcg at 11/17/16 1106  . ferric gluconate (NULECIT) 125 mg in sodium chloride 0.9 % 100 mL IVPB  125 mg Intravenous Q M,W,F-HD Deterding, James, MD      . heparin ADULT infusion 100 units/mL (25000 units/2101mL sodium chloride 0.45%)  1,100 Units/hr Intravenous Continuous Carney, Gay Filler, RPH      . insulin aspart (novoLOG) injection 0-5 Units  0-5 Units Subcutaneous QHS Opyd, Ilene Qua, MD   Stopped at 11/12/16 0145  . insulin aspart (novoLOG) injection 0-9 Units  0-9 Units Subcutaneous TID WC Opyd, Ilene Qua, MD   3 Units at 11/15/16 1204  . metoprolol succinate (TOPROL-XL) 24 hr tablet 25 mg  25 mg Oral Daily Bernerd Pho M, PA-C   25 mg at 11/16/16 1300  . morphine 4 MG/ML injection 2 mg  2 mg Intravenous Q3H PRN Opyd, Ilene Qua, MD      . multivitamin (RENA-VIT) tablet 1 tablet  1 tablet Oral Nile Riggs, MD   1 tablet at 11/14/16 2139  . mupirocin ointment (BACTROBAN) 2 %   Nasal BID Velvet Bathe, MD      . nitroGLYCERIN (NITROSTAT) SL tablet 0.4 mg  0.4 mg Sublingual Q5 min PRN Camnitz, Ocie Doyne, MD      . nitroGLYCERIN 50 mg in dextrose 5 % 250 mL (0.2 mg/mL) infusion  0-200 mcg/min Intravenous Once Opyd, Ilene Qua, MD   Stopped at 11/14/16 0234  . ondansetron (ZOFRAN) injection 4 mg  4 mg Intravenous Q6H PRN Opyd, Ilene Qua, MD      . piperacillin-tazobactam (ZOSYN) IVPB 3.375 g  3.375 g Intravenous Q12H Velvet Bathe, MD   Stopped at 11/16/16 2156  . polyvinyl alcohol (LIQUIFILM TEARS) 1.4 % ophthalmic solution 1 drop  1 drop Both Eyes PRN Opyd, Ilene Qua, MD      . sodium chloride flush (NS) 0.9 % injection 10-40 mL  10-40 mL Intracatheter PRN Jamal Maes, MD      . traMADol Veatrice Bourbon) tablet 50 mg  50 mg Oral Q6H PRN Opyd, Ilene Qua, MD      . vancomycin  (VANCOCIN) IVPB 750 mg/150 ml premix  750 mg Intravenous Once Pat Patrick, [facility]         Discharge Medications: Please see discharge summary for a list of discharge medications.  Relevant Imaging Results:  Relevant Lab Results:   Additional Information SSN: [id number]  bipap settings - set rate 10; respiratory rate 26; IPAP 14; EPAP 6; O2% 30; minute vent 10.3; leak 48; peak inspiratory pressure 13; tidal vol 448  Estanislado Emms, LCSW

## 2016-11-17 NOTE — Progress Notes (Signed)
[place] Progress Note Patient Name: [name] DOB: [birth year] MRN: [id number]   Date of Service  11/17/2016  HPI/Events of Note  Troponin = 0.73. Patient was already on Heparin and ASA for ACS protocol. However, with his R rectus sheath hematoma, we will need to D/C his Heparin IV infusion and ASA.   eICU Interventions  Will continue to trend troponin.      Intervention Category Intermediate Interventions: Diagnostic test evaluation  Macarius Ruark Cornelia Copa 11/17/2016, 9:56 PM

## 2016-11-17 NOTE — Progress Notes (Signed)
PT Cancellation Note  Patient Details Name: [name] MRN: [id number] DOB: 10/26/1928   Cancelled Treatment:    Reason Eval/Treat Not Completed: Patient at procedure or test/unavailable. Pt in HD.   Shary Decamp Maycok 11/17/2016, 9:06 AM Suanne Marker PT [phone]

## 2016-11-17 NOTE — Progress Notes (Signed)
OT Cancellation Note  Patient Details Name: HOGAN HOOBLER MRN: [id number] DOB: [birth year]   Cancelled Treatment:    Reason Eval/Treat Not Completed: Medical issues which prohibited therapy (Per RN, pt about to go back on bipap. Will follow.)  [name] 11/17/2016, 2:16 PM  11/17/2016 Nestor Lewandowsky, OTR/L Pager: [phone]

## 2016-11-17 NOTE — Progress Notes (Signed)
Progress Note  Patient Name: [name] Date of Encounter: 11/17/2016  Primary Cardiologist: Dr. Neita Garnet  Subjective   Pt seen on dialysis. No longer on BiPap, on [place]. He is not very communicative. He denies chest pain or shortness of breath.   Inpatient Medications    Scheduled Meds: . allopurinol  50 mg Oral Daily  . aspirin  81 mg Oral QODAY  . calcitRIOL  0.5 mcg Oral QODAY  . clopidogrel  75 mg Oral Daily  . darbepoetin (ARANESP) injection - DIALYSIS  200 mcg Intravenous Q Mon-HD  . insulin aspart  0-5 Units Subcutaneous QHS  . insulin aspart  0-9 Units Subcutaneous TID WC  . metoprolol succinate  25 mg Oral Daily  . multivitamin  1 tablet Oral QHS  . mupirocin ointment   Nasal BID   Continuous Infusions: . sodium chloride    . sodium chloride    . sodium chloride    . amiodarone 30 mg/hr (11/17/16 0152)  . ferric gluconate (FERRLECIT/NULECIT) IV    . heparin 1,000 Units/hr (11/16/16 2039)  . nitroGLYCERIN Stopped (11/14/16 0234)  . piperacillin-tazobactam (ZOSYN)  IV Stopped (11/16/16 2156)   PRN Meds: sodium chloride, sodium chloride, acetaminophen **OR** acetaminophen, albuterol, alteplase, heparin, lidocaine (PF), lidocaine-prilocaine, morphine injection, nitroGLYCERIN, ondansetron (ZOFRAN) IV, pentafluoroprop-tetrafluoroeth, polyvinyl alcohol, sodium chloride flush, traMADol   Vital Signs    Vitals:   11/17/16 0830 11/17/16 0900 11/17/16 0915 11/17/16 0930  BP: (!) 112/53 107/82 (!) 99/46 (!) 89/51  Pulse: (!) 102 (!) 105 86 (!) 102  Resp: (!) 31 (!) 22 (!) 31 (!) 30  Temp:   97.7 F (36.5 C) 97.7 F (36.5 C)  TempSrc:   Oral Oral  SpO2: 100% 100% 100% 100%  Weight:      Height:       No intake or output data in the 24 hours ending 11/17/16 0939 Filed Weights   11/15/16 0500 11/16/16 0017 11/16/16 0332  Weight: 182 lb 11.2 oz (82.9 kg) 176 lb 12.9 oz (80.2 kg) 174 lb 9.7 oz (79.2 kg)    Telemetry    Was SR in the 70's overnight. Currenlty  in afib in the 90's - Personally Reviewed  ECG    No new tracings - Personally Reviewed  Physical Exam   GEN:  Elderly, chronically ill gentleman in no acute distress. Neck: No JVD Cardiac: irregulalry irregular rhythm, no murmurs, rubs, or gallops.  Respiratory: Clear to auscultation bilaterally except faint crackles in bases GI: Soft, nontender, non-distended  MS: trace pretibial edema; No deformity. Neuro:   Hard of hearing but also very difficult to communicate with.  He would not respond to my questions. Psych: Unable to assess  Labs    Chemistry Recent Labs Lab 11/14/16 1209  11/15/16 0947 11/16/16 1008 11/17/16 0410  NA  --   < > 137 138 138  K  --   < > 3.9 4.2 4.3  CL  --   < > 97* 100* 98*  CO2  --   < > 24 25 24   GLUCOSE  --   < > 284* 113* 108*  BUN  --   < > 102* 50* 70*  CREATININE  --   < > 5.77* 3.64* 4.66*  CALCIUM  --   < > 8.9 8.3* 8.7*  PROT 6.1*  --   --   --   --   ALBUMIN 2.7*  --  2.4* 2.2* 2.3*  AST 20  --   --   --   --  ALT 10*  --   --   --   --   ALKPHOS 90  --   --   --   --   BILITOT 0.9  --   --   --   --   GFRNONAA  --   < > 8* 14* 10*  GFRAA  --   < > 9* 16* 12*  ANIONGAP  --   < > 16* 13 16*  < > = values in this interval not displayed.   Hematology Recent Labs Lab 11/15/16 0947 11/16/16 1008 11/17/16 0410  WBC 21.5* 15.9* 15.2*  RBC 2.60* 2.41* 2.23*  HGB 8.1* 7.5* 7.1*  HCT 25.3* 23.4* 21.7*  MCV 97.3 97.1 97.3  MCH 31.2 31.1 31.8  MCHC 32.0 32.1 32.7  RDW 16.0* 16.1* 16.6*  PLT 307 291 307    Cardiac Enzymes Recent Labs Lab 11/12/16 0037 11/12/16 0436 11/12/16 1559  TROPONINI 1.16* 1.74* 1.96*    Recent Labs Lab 11/11/16 2233  TROPIPOC 0.62*     BNP Recent Labs Lab 11/11/16 2239  BNP 2,178.5*     DDimer No results for input(s): DDIMER in the last 168 hours.   Radiology    Dg Chest Port 1 View  Result Date: 11/15/2016 CLINICAL DATA:  Central line placement. EXAM: PORTABLE CHEST 1 VIEW  COMPARISON:  11/15/2016 FINDINGS: Interval placement of left subclavian central venous catheter with tip over the SVC. Lungs are hypoinflated with mild hazy opacification right base unchanged likely mild residual vascular congestion. No pneumothorax. Mild stable cardiomegaly. Calcified plaque over the aortic arch. Hardware intact over the cervical spine and thoracolumbar spine. Remainder of the exam is unchanged. IMPRESSION: Hypoinflation with stable hazy right basilar opacification likely mild residual vascular congestion, although cannot exclude atelectasis or infection. Left subclavian central venous catheter with tip over the SVC. No pneumothorax. Electronically Signed   By: Marin Olp M.D.   On: 11/15/2016 21:13   Dg Chest Port 1 View  Result Date: 11/15/2016 CLINICAL DATA:  Acute respiratory failure EXAM: PORTABLE CHEST 1 VIEW COMPARISON:  11/15/2016 FINDINGS: Post- operative changes of the cervical spine. Low lung volumes. Hazy bibasilar atelectasis or infiltrates. Cardiomegaly with central vascular congestion. Aortic atherosclerosis. No pneumothorax. IMPRESSION: 1. Cardiomegaly with mild central vascular congestion 2. Hazy bibasilar atelectasis or small infiltrates. Cannot exclude small pleural effusions. Electronically Signed   By: Donavan Foil M.D.   On: 11/15/2016 18:59   Dg Chest Port 1 View  Result Date: 11/15/2016 CLINICAL DATA:  Kidney EXAM: PORTABLE CHEST 1 VIEW COMPARISON:  Chest x-ray dated 11/13/2016 and chest x-ray dated 10/31/2016. FINDINGS: Cardiomegaly is stable. Atherosclerotic changes noted at the aortic arch. Lungs are clear, questionable mild atelectasis at each lung base. No pleural effusion or pneumothorax seen. No acute or suspicious osseous finding. Fixation hardware again noted within the cervical and thoracic spine. Extensive degenerative changes again noted at each shoulder. IMPRESSION: No active disease.  No evidence of pneumonia or pulmonary edema. Stable  cardiomegaly. Aortic atherosclerosis. Electronically Signed   By: Franki Cabot M.D.   On: 11/15/2016 09:49    Cardiac Studies   Echocardiogram 11/13/2016 Study Conclusions - Left ventricle: The cavity size was mildly dilated. Wall   thickness was normal. Systolic function was severely reduced. The   estimated ejection fraction was in the range of 25% to 30%.   Diffuse hypokinesis. There is akinesis of the anterior and apical   myocardium. Doppler parameters are consistent with restrictive   physiology, indicative of  decreased left ventricular diastolic   compliance and/or increased left atrial pressure. Doppler   parameters are consistent with high ventricular filling pressure. - Mitral valve: Calcified annulus. There was mild to moderate   regurgitation. - Right ventricle: The cavity size was mildly dilated.  Impressions: - Definity used; Diffuse hypokinesis with akinesis of the anterior   wall and apex; overall severely reduced LV systolic function;   restrictive filling; mild LVE; mild to moderate MR; mild RVE.  Patient Profile     83 y.o. male with CAD s/p medically managed STEMI, chronic combined systolic and diastolic CHF, HTN and CKD V here with acute on chronic systolic and diastolic heart failure and possible HCAP.   Assessment & Plan    Acute on chronic systolic and diastolic heart failure:  LVEF reduced to 25-30% with akinesis of the anterior and apical myocardium, down from 40-45% in 02/2013. Volume overloaded on admission. Now getting HD, but volume removal is limited by low BP's. Lasix has not done much. Wt down from 180 lbs to 174 lbs today.   NSTEMI: Troponins elevated with max 1.96. Currenlty on heparin. Plan for LHC once stabilized. Currently no chest pain.  On Plavix, aspirin, Toprol. Not on statin (lipitor and crestor on allergy list due to muscle pain- should re-challenge at lower dose)  CKD stage V:  Hemodialysis has been started. Volume off is limited by  hypotension. Has PD cath placed.   Hypertension: BP's well controlled, but low on dialysis.   PAF: On heparin and IV amiodarone. Was in sinus rhythm over night currently in afib in the 90's. CHA2DS2/VAS Stroke Risk Score is at least 5 (CHF, HTN, Age (2), vasc dz). Plan to start coumadin once no further invasive procedures needed. Plan to switch to oral amiodarone once more awake/alert, taking po meds.   Leukocytosis with suspected PNA: on IV antibiotics per IM. Blood cultures and sputum cultures pending.   Diabetes type 2: Hgb A1c 6.0. Management pre IM with SSI.    For questions or updates, please contact [place] Please consult [email or web address] for contact info under Cardiology/STEMI.      Signed, Daune Perch, NP  11/17/2016, 9:39 AM    Attending Note:   The patient was seen and examined.  Agree with assessment and plan as noted above.  Changes made to the above note as needed.  Patient seen and independently examined with Pecolia Ades, NP .   We discussed all aspects of the encounter. I agree with the assessment and plan as stated above.  1.  Acute on chronic combined systolic and diastolic congestive heart failure: Patient has systolic heart failure.  He has improved dramatically on dialysis. We typically would like to do an ischemic workup but at this point his mental status would preclude [place] from doing a heart catheterization.  It appears that he has gradually declined over the past week or so.  Continue with a conservative approach for now. I would consider heart catheterization only after he has made significant improvement from mental standpoint and also from a physical strength standpoint.  He has  not walked in over 2 weeks.  2.  Paroxysmal atrial fibrillation: Currently in Afib      I have spent a total of 40 minutes with patient reviewing hospital  notes , telemetry, EKGs, labs and examining patient as well as establishing an assessment and plan that was  discussed with the patient. > 50% of time was spent in direct patient care.  Thayer Headings, Brooke Bonito., MD, [facility] 11/17/2016, 11:38 AM [address],  [place] Pager [phone]

## 2016-11-17 NOTE — Progress Notes (Signed)
PULMONARY / CRITICAL CARE MEDICINE   Name: [name] MRN: [id number] DOB: [birth year]    ADMISSION DATE:  11/11/2016 CONSULTATION DATE:  11/15/16  REFERRING MD:  Dr. Wendee Beavers / [facility]   CHIEF COMPLAINT:  Dyspnea  BRIEF SUMMARY:   83 y/o M with a complex medical history admitted 10/23 with dyspnea and A. Fib with RVR.   On 10/11, the patient underwent peritoneal dialysis catheter placement by Dr. Johney Maine.  Family reports after dialysis after placement he was altered/confused.  CT of the head on 10/12 revealed no acute abnormality but chronic microvascular disease.  At baseline he lives at home with his wife who is a retired OB nurse.he does require assistance with bathing and uses a walker. A typical day for him involves him getting up and sitting at the kitchen table to watch TV and read. Family reports since his surgery for PD catheter placement he has not been at his baseline.  They indicate he has been somewhat confused, anxious and fidgety. His daughter states she took him to the bathroom on the day prior to presentation on 10/23 and he had a bowel movement that smelled like melena (she is a Equities trader as well).  The patient was initially admitted for evaluation of A. Fib with RVR. He was found to have an NSTEMI with a peak troponin of 1.92 (this was the last assessed troponin). He also had an echocardiogram which demonstrated a reduction in his LVEF to 25-30%.  Prior assessment was 40-45% 2015.  During the hospitalization the patient has had progressive anemia. His preoperative hemoglobin was 12.1.  Currently he is 7.1 and received 1 unit of packed cells during hemodialysis.  Family reports they had a difficult time placing a temporary hemodialysis catheter with 2 attempts.  He has not tolerated peritoneal dialysis thus far. Lab review shows an anion gap metabolic acidosis (20 corrected for albumin).  He has had tachypnea and concerns for respiratory distress intermittently requiring BiPAP.     PCCM called back for reevaluation of respiratory status.  SUBJECTIVE:  Patient denies acute pain. States that he feels mildly short of breath. Otherwise unable to answer questions due to confusion. Information obtained from family at bedside.  VITAL SIGNS: BP 112/87   Pulse 91   Temp 99.2 F (37.3 C) (Axillary)   Resp (!) 41   Ht 5\' 4"  (1.626 m)   Wt 173 lb 4.5 oz (78.6 kg)   SpO2 100%   BMI 29.74 kg/m   HEMODYNAMICS:    VENTILATOR SETTINGS: FiO2 (%):  [30 %] 30 %  INTAKE / OUTPUT: I/O last 3 completed shifts: In: -  Out: 1007 [Other:1007]  PHYSICAL EXAMINATION: General:  Chronically ill appearing male in NAD, lying in bed HEENT: MM pink/moist, right temporal scar from prior lymphoma treatment  PSY: calm  Neuro: Awake, alert, oriented to self / wife, can not state place or events  CV: s1s2 rrr, no m/r/g PULM: even/non-labored, lungs bilaterally clear anterior, bibasilar crackles posterior  GI: soft, midline area firm with demarcated rounded area (? Hematoma), warm to touch  Extremities: warm/dry, no edema  Skin: no rashes or lesions   LABS:  BMET  Recent Labs Lab 11/15/16 0947 11/16/16 1008 11/17/16 0410  NA 137 138 138  K 3.9 4.2 4.3  CL 97* 100* 98*  CO2 24 25 24   BUN 102* 50* 70*  CREATININE 5.77* 3.64* 4.66*  GLUCOSE 284* 113* 108*    Electrolytes  Recent Labs Lab 11/15/16 0947 11/16/16  1008 11/17/16 0410  CALCIUM 8.9 8.3* 8.7*  PHOS 7.0* 5.6* 6.2*    CBC  Recent Labs Lab 11/15/16 0947 11/16/16 1008 11/17/16 0410  WBC 21.5* 15.9* 15.2*  HGB 8.1* 7.5* 7.1*  HCT 25.3* 23.4* 21.7*  PLT 307 291 307    Coag's  Recent Labs Lab 11/15/16 1243  APTT 34    Sepsis Markers  Recent Labs Lab 11/12/16 0037 11/12/16 0436 11/13/16 0350  PROCALCITON 0.22 0.20 0.25    ABG  Recent Labs Lab 11/13/16 0128  PHART 7.336*  PCO2ART 45.2  PO2ART 135*    Liver Enzymes  Recent Labs Lab 11/14/16 1209 11/15/16 0947  11/16/16 1008 11/17/16 0410  AST 20  --   --   --   ALT 10*  --   --   --   ALKPHOS 90  --   --   --   BILITOT 0.9  --   --   --   ALBUMIN 2.7* 2.4* 2.2* 2.3*    Cardiac Enzymes  Recent Labs Lab 11/12/16 0037 11/12/16 0436 11/12/16 1559  TROPONINI 1.16* 1.74* 1.96*    Glucose  Recent Labs Lab 11/16/16 0438 11/16/16 0847 11/16/16 1205 11/16/16 1702 11/16/16 2119 11/17/16 1116  GLUCAP 82 99 116* 120* 114* 107*    Imaging No results found.   STUDIES:  ECHO 10/25 >> LV mildly dilated, systolic function severely reduced, LVEF 25-30%, diffuse hypokinesis, doppler parameters consistent with restrictive physiology, indicative of decreased left ventricular diastolic compliance and/or increased left atrial pressure, mild to mod MR, RV mildly dilated  CULTURES: GS Peritoneal Fluid 10/26 >> negative  Peritoneal Fluid Culture 10/26 >>  Aerobic Culture Peritoneal 10/26 >> negative  BCx2 10/24 >> negative   ANTIBIOTICS: Zosyn 10/24 >>  Vanco 10/24 >>   SIGNIFICANT EVENTS: 10/23  Admit with 2 week hx of chest pain, NSTEMI  LINES/TUBES: PD Catheter 10/12 >>   L [place] HD Cath 10/27 >>   DISCUSSION: 83 y/o M admitted on 10/23 with a 2 week history of chest pain.  Found to have an NSTEMI.  Complicated course with AFwRVR, melena  ASSESSMENT / PLAN:  PULMONARY A: Tachypnea - suspect multifactorial in setting of anemia, AFwRVR, CHF, AGMA, kyphosis Pulmonary Hypertension - based on ECHO 02/2016 with pressure of 37 Hx Asthma with Mild to Moderate Obstructive Lung Disease - based on PFT 04/2015 P:   Now CXR to assess for pleural effusion / CHF / PNA Prior film review does not show consolidated PNA > favor excess volume  Pulmonary hygiene - IS, mobilize  CARDIOVASCULAR A:  Chest Pressure / NSTEMI  PAF - on IV heparin + amiodarone Acute on Chronic Systolic & Diastolic CHF  - NEW LV reduction to 25-30%, volume overloaded on admit per documentation Soft / Normal BP - on  toprol  Hx Chronic Combined Systolic / Diastolic CHF (09/3714 LVEF 40-45%), Ischemic Heart Disease, HTN, MI (2009), RBBB P:  Heparin gtt per Cardiology > ? Duration of anticoagulation given melena + decrease in Hgb 5gm Tele monitoring  Continue amiodarone gtt  Plavix, ASA, Toprol per Cardiology  RENAL A:   CKD - s/p temp HD cath, volume removal limited by pressures Recent PD Catheter Placement  BPH P:   PD/HD per Renal  Trend BMP / urinary output Replace electrolytes as indicated Avoid nephrotoxic agents, ensure adequate renal perfusion  GASTROINTESTINAL A:   Melena - ? GIB vs hemorrhoids (staff unsure of color) ? Abdominal Hematoma - midline, firm area, recent PD  cath placement, now on heparin Rule Out Dysphagia - new cough, AMS, ? Aspiration, ? Neuro event with confusion P:   Assess abdominal CT to rule out hematoma  Diet as tolerated Aspiration precautions  SLP evaluation  Add PPI with concerns for melena   HEMATOLOGIC A:   Anemia - Hgb in early October 2018 12 > now 7. S/p 1 unit PRBC 10/29 with HD  Coagulopathy - on heparin for AF P:  Trend CBC  Monitor for bleeding  Heparin per Pharmacy   INFECTIOUS A:   Leukocytosis  P:   Trend WBC / fever curve  Empiric ABX as above   ENDOCRINE A:   Gout  Hyperglycemia - no hx of DM  P:   SSI per primary  Continue allopurinol   NEUROLOGIC A:   Acute Metabolic Encephalopathy P:   Supportive care  Repeat CT head to ensure no acute embolic event with AF Frequent re-orientation   FAMILY  - Updates: Family updated extensively at bedside.  Reviewed plan of care.  They elect to continue current aggressive medical efforts as long as he has a good quality of life.  However, in the event of arrest they would not want CPR, mechanical ventilation / ACLS.     Noe Gens, NP-C [facility] Pgr: [phone] or if no answer [phone] 11/17/2016, 3:15 PM

## 2016-11-17 NOTE — Progress Notes (Addendum)
PROGRESS NOTE    [name]  URK:[id number] DOB: 12-Apr-1928 DOA: 11/11/2016 PCP: Celene Squibb, MD    Brief Narrative:  83 y.o. male with medical history significant for medically managed coronary artery disease, chronic combined systolic/diastolic CHF, hypertension, and chronic kidney disease stage IV-V, now presenting to the emergency department for evaluation of chest discomfort and dyspnea.    Assessment & Plan:   Principal Problem:   NSTEMI (non-ST elevated myocardial infarction)- type 2 - Cardiology consulted and managing - pt is on heparin gtt - Pt is unstable for cardiac cath  New onset PAF - pt on heparin and IV amiodarone. Chads score 5 or more. Once patient is more stable plan is to start coumadin.  Active Problems:   CKD (chronic kidney disease), stage V [facility]) - Nephrology consulted and assisting with management patient started PD dialysis sessions and had temporary cath placed for further dialysis.  Anemia/ suspect GI blood loss - Pt has positive fecal occult blood no frank bleeding currently. Unstable for actual procedure as patient's respiratory status has been a daily challenge with periods of time of tachypnea and respiratory compromise requiring Bipap. - Transfuse 1 unit of PRBC during dialysis. Pt will need dialysis is hgb 8 or less.    Acute on chronic systolic and diastolic heart failure, NYHA class 4 (HCC) - Fluid status controlled with dialysis - pt on metoprolol    Leukocytosis/suspected pna. - Pt is currently on Zosyn. Concern was that initial chest x-ray reported finding suspicious for pneumonia. Awaiting blood cultures and sputum cultures    Hyperglycemia - Hgb a1c is 6.0 - continue SSI while in house. On d/c would treat with diet control  Addendum: Altered mental status - Will obtain non contrast evaluation of head to r/o new stroke  DVT prophylaxis: Pt is on heparin gtt Code Status: Full Family Communication: spoke with family member  (suspected daughter) Disposition Plan: pending further recommendations by specialist   Consultants:   Nephrology  Cardiology   Procedures: none   Antimicrobials: none   Subjective: Pt has limited response to examiners questions.  Objective: Vitals:   11/17/16 1000 11/17/16 1018 11/17/16 1100 11/17/16 1227  BP: (!) 125/55 102/63 (!) 104/52 (!) 115/49  Pulse: 94 92 91 100  Resp: (!) 32 (!) 32 (!) 31   Temp:  97.7 F (36.5 C) 99.2 F (37.3 C)   TempSrc:   Axillary   SpO2: 100% 100%    Weight:  78.6 kg (173 lb 4.5 oz)    Height:        Intake/Output Summary (Last 24 hours) at 11/17/16 1437 Last data filed at 11/17/16 1018  Gross per 24 hour  Intake              315 ml  Output             2000 ml  Net            -1685 ml   Filed Weights   11/16/16 0017 11/16/16 0332 11/17/16 1018  Weight: 80.2 kg (176 lb 12.9 oz) 79.2 kg (174 lb 9.7 oz) 78.6 kg (173 lb 4.5 oz)    Examination: Exam unchanged when compared to 11/16/2016  General exam: Pt in nad, alert and awake Respiratory system: No wheeze, no rhales, equal chest rise.  Cardiovascular system: S1 & S2 heard, RRR. No murmurs, equal chest rise. Gastrointestinal system: Abdomen is nondistended, soft and nontender. No organomegaly or masses felt. Normal bowel sounds heard. Central nervous  system: Alert and oriented. No facial asymmetry Extremities: Symmetric 5 x 5 power. Skin: No rashes, lesions or ulcers, on limited exam. Psychiatry: Mood & affect appropriate.    Data Reviewed: I have personally reviewed following labs and imaging studies  CBC:  Recent Labs Lab 11/15/16 0022 11/15/16 0456 11/15/16 0947 11/16/16 1008 11/17/16 0410  WBC 27.3* 24.2* 21.5* 15.9* 15.2*  HGB 8.7* 7.9* 8.1* 7.5* 7.1*  HCT 26.6* 24.5* 25.3* 23.4* 21.7*  MCV 97.1 96.5 97.3 97.1 97.3  PLT 332 307 307 291 106   Basic Metabolic Panel:  Recent Labs Lab 11/13/16 1449  11/15/16 0022 11/15/16 0456 11/15/16 0947 11/16/16 1008  11/17/16 0410  NA  --   < > 138 140 137 138 138  K  --   < > 4.3 4.1 3.9 4.2 4.3  CL  --   < > 98* 97* 97* 100* 98*  CO2  --   < > 25 26 24 25 24   GLUCOSE  --   < > 100* 159* 284* 113* 108*  BUN  --   < > 101* 104* 102* 50* 70*  CREATININE  --   < > 5.40* 5.70* 5.77* 3.64* 4.66*  CALCIUM  --   < > 9.0 9.1 8.9 8.3* 8.7*  PHOS 5.4*  --   --   --  7.0* 5.6* 6.2*  < > = values in this interval not displayed. GFR: Estimated Creatinine Clearance: 11 mL/min (A) (by C-G formula based on SCr of 4.66 mg/dL (H)). Liver Function Tests:  Recent Labs Lab 11/14/16 1209 11/15/16 0947 11/16/16 1008 11/17/16 0410  AST 20  --   --   --   ALT 10*  --   --   --   ALKPHOS 90  --   --   --   BILITOT 0.9  --   --   --   PROT 6.1*  --   --   --   ALBUMIN 2.7* 2.4* 2.2* 2.3*   No results for input(s): LIPASE, AMYLASE in the last 168 hours.  Recent Labs Lab 11/13/16 0350  AMMONIA 50*   Coagulation Profile: No results for input(s): INR, PROTIME in the last 168 hours. Cardiac Enzymes:  Recent Labs Lab 11/12/16 0037 11/12/16 0436 11/12/16 1559  TROPONINI 1.16* 1.74* 1.96*   BNP (last 3 results) No results for input(s): PROBNP in the last 8760 hours. HbA1C: No results for input(s): HGBA1C in the last 72 hours. CBG:  Recent Labs Lab 11/16/16 0847 11/16/16 1205 11/16/16 1702 11/16/16 2119 11/17/16 1116  GLUCAP 99 116* 120* 114* 107*   Lipid Profile: No results for input(s): CHOL, HDL, LDLCALC, TRIG, CHOLHDL, LDLDIRECT in the last 72 hours. Thyroid Function Tests: No results for input(s): TSH, T4TOTAL, FREET4, T3FREE, THYROIDAB in the last 72 hours. Anemia Panel: No results for input(s): VITAMINB12, FOLATE, FERRITIN, TIBC, IRON, RETICCTPCT in the last 72 hours. Sepsis Labs:  Recent Labs Lab 11/12/16 0037 11/12/16 0436 11/13/16 0350  PROCALCITON 0.22 0.20 0.25    Recent Results (from the past 240 hour(s))  Culture, blood (routine x 2)     Status: None   Collection Time:  11/12/16  6:32 PM  Result Value Ref Range Status   Specimen Description BLOOD RIGHT HAND  Final   Special Requests   Final    BOTTLES DRAWN AEROBIC AND ANAEROBIC Blood Culture adequate volume   Culture NO GROWTH 5 DAYS  Final   Report Status 11/17/2016 FINAL  Final  Culture, blood (  routine x 2)     Status: None   Collection Time: 11/12/16  6:42 PM  Result Value Ref Range Status   Specimen Description BLOOD LEFT HAND  Final   Special Requests   Final    BOTTLES DRAWN AEROBIC AND ANAEROBIC Blood Culture adequate volume   Culture NO GROWTH 5 DAYS  Final   Report Status 11/17/2016 FINAL  Final  MRSA PCR Screening     Status: None   Collection Time: 11/13/16 10:26 PM  Result Value Ref Range Status   MRSA by PCR NEGATIVE NEGATIVE Final    Comment:        The GeneXpert MRSA Assay (FDA approved for NASAL specimens only), is one component of a comprehensive MRSA colonization surveillance program. It is not intended to diagnose MRSA infection nor to guide or monitor treatment for MRSA infections.   Stat Gram stain     Status: None   Collection Time: 11/14/16  2:00 AM  Result Value Ref Range Status   Specimen Description FLUID PERITONEAL  Final   Special Requests NONE  Final   Gram Stain   Final    FEW WBC PRESENT, PREDOMINANTLY MONONUCLEAR NO ORGANISMS SEEN    Report Status 11/14/2016 FINAL  Final  Aerobic Culture (superficial specimen)     Status: None   Collection Time: 11/14/16  2:00 AM  Result Value Ref Range Status   Specimen Description FLUID PERITONEAL  Final   Special Requests NONE  Final   Culture NO GROWTH 2 DAYS  Final   Report Status 11/16/2016 FINAL  Final  Culture, body fluid-bottle     Status: None (Preliminary result)   Collection Time: 11/14/16 11:41 AM  Result Value Ref Range Status   Specimen Description PERITONEAL  Final   Special Requests NONE  Final   Culture NO GROWTH 3 DAYS  Final   Report Status PENDING  Incomplete  Gram stain     Status: None    Collection Time: 11/14/16 11:41 AM  Result Value Ref Range Status   Specimen Description PERITONEAL  Final   Special Requests NONE  Final   Gram Stain   Final    RARE WBC PRESENT, PREDOMINANTLY MONONUCLEAR NO ORGANISMS SEEN    Report Status 11/14/2016 FINAL  Final     Radiology Studies: Dg Chest Port 1 View  Result Date: 11/15/2016 CLINICAL DATA:  Central line placement. EXAM: PORTABLE CHEST 1 VIEW COMPARISON:  11/15/2016 FINDINGS: Interval placement of left subclavian central venous catheter with tip over the SVC. Lungs are hypoinflated with mild hazy opacification right base unchanged likely mild residual vascular congestion. No pneumothorax. Mild stable cardiomegaly. Calcified plaque over the aortic arch. Hardware intact over the cervical spine and thoracolumbar spine. Remainder of the exam is unchanged. IMPRESSION: Hypoinflation with stable hazy right basilar opacification likely mild residual vascular congestion, although cannot exclude atelectasis or infection. Left subclavian central venous catheter with tip over the SVC. No pneumothorax. Electronically Signed   By: Marin Olp M.D.   On: 11/15/2016 21:13   Dg Chest Port 1 View  Result Date: 11/15/2016 CLINICAL DATA:  Acute respiratory failure EXAM: PORTABLE CHEST 1 VIEW COMPARISON:  11/15/2016 FINDINGS: Post- operative changes of the cervical spine. Low lung volumes. Hazy bibasilar atelectasis or infiltrates. Cardiomegaly with central vascular congestion. Aortic atherosclerosis. No pneumothorax. IMPRESSION: 1. Cardiomegaly with mild central vascular congestion 2. Hazy bibasilar atelectasis or small infiltrates. Cannot exclude small pleural effusions. Electronically Signed   By: Madie Reno.D.  On: 11/15/2016 18:59   Scheduled Meds: . allopurinol  50 mg Oral Daily  . aspirin  81 mg Oral QODAY  . calcitRIOL  0.5 mcg Oral QODAY  . clopidogrel  75 mg Oral Daily  . darbepoetin (ARANESP) injection - DIALYSIS  200 mcg Intravenous Q  Mon-HD  . insulin aspart  0-5 Units Subcutaneous QHS  . insulin aspart  0-9 Units Subcutaneous TID WC  . metoprolol succinate  25 mg Oral Daily  . multivitamin  1 tablet Oral QHS  . mupirocin ointment   Nasal BID   Continuous Infusions: . amiodarone 30 mg/hr (11/17/16 0152)  . ferric gluconate (FERRLECIT/NULECIT) IV    . heparin 1,100 Units/hr (11/17/16 1130)  . nitroGLYCERIN Stopped (11/14/16 0234)  . piperacillin-tazobactam (ZOSYN)  IV Stopped (11/16/16 2156)     LOS: 5 days    Time spent: > 35 min  Velvet Bathe, MD [facility] Pager [phone]  If 7PM-7AM, please contact night-coverage [email or web address] Password [facility] 11/17/2016, 2:37 PM

## 2016-11-17 NOTE — Procedures (Signed)
Patient seen and examined on Hemodialysis. QB 300 mL/ min, UF goal 1L. Treatment adjusted as needed.  Madelon Lips MD [facility] pgr [phone] 9:51 AM

## 2016-11-17 NOTE — Clinical Social Work Note (Signed)
Clinical Social Work Assessment  Patient Details  Name: [name] MRN: [id number] Date of Birth: [birth year]  Date of referral:  11/17/16               Reason for consult:  Facility Placement                Permission sought to share information with:  Facility Sport and exercise psychologist, Family Supports Permission granted to share information::  No (patient alert but not very responsive, family at bedside)  Name::     Abdur Hoglund  Agency::  SNFs  Relationship::  spouse  Contact Information:  [phone]  Housing/Transportation Living arrangements for the past 2 months:  Single Family Home Source of Information:  Adult Children, Spouse Patient Interpreter Needed:  None Criminal Activity/Legal Involvement Pertinent to Current Situation/Hospitalization:  No - Comment as needed Significant Relationships:  Adult Children, Spouse Lives with:  Spouse Do you feel safe going back to the place where you live?  Yes Need for family participation in patient care:  Yes (Comment)  Care giving concerns:  Patient from home with wife. PT recommending SNF.   Social Worker assessment / plan: CSW met with patient, spouse, and two daughters at bedside. Family does not prefer facilities in [place] and are considering home with home health. Patient recent re-admit and daughter reports patient went home last time, but home health orders were not given and patient did not receive services. CSW discussed PT recommendation and planning for supervision and care at home. Family considering home health aide options. CSW faxed out to SNFs in [place] and [place] areas that accept patient's insurance. CSW referred to [facility] for further discussion on home health. CSW to follow for disposition planning.  Employment status:  Retired Forensic scientist:  Administrator) PT Recommendations:  [place] / Referral to community resources:  [place]  Patient/Family's Response to care: Family appreciative of care.  Patient/Family's Understanding of and Emotional Response to Diagnosis, Current Treatment, and Prognosis: Family prefers home health but open to making SNF referrals. Family with good understanding of patient's conditions.  Emotional Assessment Appearance:  Appears stated age Attitude/Demeanor/Rapport:  Lethargic Affect (typically observed):  Unable to Assess Orientation:  Oriented to Self Alcohol / Substance use:  Not Applicable Psych involvement (Current and /or in the community):  No (Comment)  Discharge Needs  Concerns to be addressed:  Discharge Planning Concerns Readmission within the last 30 days:  Yes Current discharge risk:  Physical Impairment, Cognitively Impaired Barriers to Discharge:  Continued Medical Work up   Estanislado Emms, LCSW 11/17/2016, 3:24 PM

## 2016-11-17 NOTE — Progress Notes (Signed)
OT Cancellation Note  Patient Details Name: [name] MRN: [id number] DOB: 01-10-1929   Cancelled Treatment:    Reason Eval/Treat Not Completed: Patient at procedure or test/ unavailable. Pt currently in HD. Will follow.  [name] 11/17/2016, 9:22 AM  11/17/2016 Nestor Lewandowsky, OTR/L Pager: [phone]

## 2016-11-17 NOTE — Plan of Care (Signed)
Problem: Tissue Perfusion: Goal: Risk factors for ineffective tissue perfusion will decrease Outcome: Progressing Pt in hemodialysis at present

## 2016-11-17 NOTE — Progress Notes (Signed)
PT Cancellation Note  Patient Details Name: [name] MRN: [id number] DOB: 1928-07-17   Cancelled Treatment:    Reason Eval/Treat Not Completed: Medical issues which prohibited therapy. Per RN pt getting ready to go back on bipap.   Shary Decamp [place] 11/17/2016, 2:20 PM [employer] PT [phone]

## 2016-11-17 NOTE — Progress Notes (Signed)
ANTICOAGULATION CONSULT NOTE - Follow Up Consult  Pharmacy Consult for Heparin Indication: chest pain/ACS  Allergies  Allergen Reactions  . Gentamycin [Gentamicin] Other (See Comments)    Decreased kidney fx  . Crestor [Rosuvastatin Calcium] Other (See Comments)    MUSCLE PAIN  . Hctz [Hydrochlorothiazide] Other (See Comments)    dizziness  . Lipitor [Atorvastatin Calcium] Other (See Comments)    MUSCLE PAIN    Patient Measurements: Height: 5\' 4"  (162.6 cm) Weight: 174 lb 9.7 oz (79.2 kg) IBW/kg (Calculated) : 59.2 Heparin Dosing Weight:  76.4 kg  Vital Signs: Temp: 97.7 F (36.5 C) (10/29 0945) Temp Source: Oral (10/29 0945) BP: 125/55 (10/29 1000) Pulse Rate: 92 (10/29 1018)  Labs:  Recent Labs  11/15/16 0947 11/15/16 1243 11/16/16 1008 11/17/16 0410  HGB 8.1*  --  7.5* 7.1*  HCT 25.3*  --  23.4* 21.7*  PLT 307  --  291 307  APTT  --  34  --   --   HEPARINUNFRC  --  <0.10* 0.54 0.28*  CREATININE 5.77*  --  3.64* 4.66*    Estimated Creatinine Clearance: 11 mL/min (A) (by C-G formula based on SCr of 4.66 mg/dL (H)).  Assessment: 74 yoM presenting with NSTEMI, continues on heparin for anticoagulation. Heparin level just slightly below goal this morning.  No bleeding or complications noted. Hgb downtrending, now 7.5. Nephrology to start Aranesp, Nulecit 10/29. Pltc stable WNL.   Goal of Therapy:  Heparin level 0.3-0.7 units/ml Monitor platelets by anticoagulation protocol: Yes   Plan:  Increase IV heparin to 1100 units/hr Recheck heparin level 8 hrs after rate increase. Daily heparin level and CBC Monitor for s/sx of bleeding  Uvaldo Rising, BCPS  Clinical Pharmacist Pager [phone]  11/17/2016 10:58 AM

## 2016-11-18 ENCOUNTER — Inpatient Hospital Stay (HOSPITAL_COMMUNITY): Payer: Medicare HMO

## 2016-11-18 DIAGNOSIS — Z7189 Other specified counseling: Secondary | ICD-10-CM

## 2016-11-18 DIAGNOSIS — Z515 Encounter for palliative care: Secondary | ICD-10-CM

## 2016-11-18 DIAGNOSIS — D62 Acute posthemorrhagic anemia: Secondary | ICD-10-CM

## 2016-11-18 DIAGNOSIS — R9089 Other abnormal findings on diagnostic imaging of central nervous system: Secondary | ICD-10-CM

## 2016-11-18 LAB — BPAM RBC
BLOOD PRODUCT EXPIRATION DATE: 201811162359
ISSUE DATE / TIME: 201810290903
UNIT TYPE AND RH: 5100

## 2016-11-18 LAB — TYPE AND SCREEN
ABO/RH(D): O POS
Antibody Screen: NEGATIVE
Unit division: 0

## 2016-11-18 LAB — CBC
HEMATOCRIT: 24.9 % — AB (ref 39.0–52.0)
HEMATOCRIT: 24.7 % — AB (ref 39.0–52.0)
Hemoglobin: 8.1 g/dL — ABNORMAL LOW (ref 13.0–17.0)
Hemoglobin: 8.1 g/dL — ABNORMAL LOW (ref 13.0–17.0)
MCH: 31.6 pg (ref 26.0–34.0)
MCH: 31.6 pg (ref 26.0–34.0)
MCHC: 32.5 g/dL (ref 30.0–36.0)
MCHC: 32.8 g/dL (ref 30.0–36.0)
MCV: 97.3 fL (ref 78.0–100.0)
MCV: 96.5 fL (ref 78.0–100.0)
Platelets: 304 10*3/uL (ref 150–400)
Platelets: 321 10*3/uL (ref 150–400)
RBC: 2.56 MIL/uL — ABNORMAL LOW (ref 4.22–5.81)
RBC: 2.56 MIL/uL — AB (ref 4.22–5.81)
RDW: 16.4 % — AB (ref 11.5–15.5)
RDW: 16.7 % — AB (ref 11.5–15.5)
WBC: 15.5 10*3/uL — ABNORMAL HIGH (ref 4.0–10.5)
WBC: 15.2 10*3/uL — AB (ref 4.0–10.5)

## 2016-11-18 LAB — RENAL FUNCTION PANEL
ANION GAP: 14 (ref 5–15)
Albumin: 2.1 g/dL — ABNORMAL LOW (ref 3.5–5.0)
BUN: 55 mg/dL — ABNORMAL HIGH (ref 6–20)
CALCIUM: 8.6 mg/dL — AB (ref 8.9–10.3)
CHLORIDE: 98 mmol/L — AB (ref 101–111)
CO2: 24 mmol/L (ref 22–32)
Creatinine, Ser: 4.2 mg/dL — ABNORMAL HIGH (ref 0.61–1.24)
GFR calc Af Amer: 14 mL/min — ABNORMAL LOW (ref >60)
GFR calc non Af Amer: 12 mL/min — ABNORMAL LOW (ref >60)
GLUCOSE: 102 mg/dL — AB (ref 65–99)
Phosphorus: 5.6 mg/dL — ABNORMAL HIGH (ref 2.5–4.6)
Potassium: 3.7 mmol/L (ref 3.5–5.1)
SODIUM: 136 mmol/L (ref 135–145)

## 2016-11-18 LAB — PROCALCITONIN: Procalcitonin: 1.66 ng/mL

## 2016-11-18 LAB — GLUCOSE, CAPILLARY
Glucose-Capillary: 107 mg/dL — ABNORMAL HIGH (ref 65–99)
Glucose-Capillary: 120 mg/dL — ABNORMAL HIGH (ref 65–99)

## 2016-11-18 MED ORDER — RESOURCE THICKENUP CLEAR PO POWD
ORAL | Status: DC | PRN
  Filled 2016-11-18: qty 125

## 2016-11-18 MED ORDER — TRAZODONE HCL 50 MG PO TABS
25.0000 mg | ORAL_TABLET | Freq: Every day | ORAL | Status: AC
  Administered 2016-11-18: 25 mg via ORAL
  Filled 2016-11-18: qty 1

## 2016-11-18 NOTE — Consult Note (Addendum)
CC/Reason for consult: Prior PD catheter placement, CT today showing possible communication with gastric antrum  HPI: [name] is an 83 y.o. male c hx of CAD, CHF (EF 25-30% on new TTE, down from 40-45% previously), HTN, ESRD, who had recent PD cathter placed 10/11 by Dr. Johney Maine. Admitted currently with NSTEMI and has been initiated on heparin gtt in addition to aspirin. During this, he developed swelling of the right abdominal wall and a drifting hemoglobin. A CT scan was then obtained which demonstrated a large right rectus sheath hematoma where the PD cath tube traverses the abdominal wall. Also noted on CT was that the catheter may exit/enter the wall of the gastric antrum. We were asked to evaluate for this possible finding  His heparin drip was stopped tonight around 8pm.  Per the patients daughter who has been heavily involved in his care, nothing acutely has changed in his mental status/cognition in the last 2 weeks. His PD cath has been functional but he has required conventional HD as it has been more effective for him. Currently the patient has abdominal wall discomfort near the hematoma but nothing on the left side.  1U PRBC given today with appropriate response  Past Medical History:  Diagnosis Date  . Acute respiratory failure with hypoxia ([place]) 03/03/2013  . Asthma   . CAD (coronary artery disease)- MI in '09 02/28/2013  . Cancer ([place])    lymphoma  1979    . CHF (congestive heart failure) ([place])    2015  . Chronic renal insufficiency    creatinine ranges around 2.8  . Complication of anesthesia    No anesthesia complications, but concerns about neck positioning due to cervical disease  . DOE (dyspnea on exertion)   . Dyspnea    upon exertion  . Gout   . HCAP (healthcare-associated pneumonia) 03/03/2013  . Herpes zoster 12/2009   left eye ..........last flare up "a long time"  . History of BPH   . HOH (hard of hearing)    hears well out of left ear  . Hypertension     . IHD (ischemic heart disease)    Remote MI in 2009 with late presentation; no reperfusion. Has large area of infarct in the apical anterior area on last nuclear in 2011. Managed medically  . Influenza A 03/03/2013  . MI, old 03/2007   ACUTE ANTEROSEPTAL  . NSTEMI (non-ST elevated myocardial infarction)- type 2 02/28/2013  . OA (osteoarthritis) of knee    Left knee with revision and replacement of prosthetic L TKR  . Peripheral edema   . RBBB (right bundle branch block)   . Renal insufficiency   . Staph infection    states that gentamycin & vancomycin, (30 - 45 days worth) are what did his kidneys in, per his daughter    Past Surgical History:  Procedure Laterality Date  . BACK SURGERY     has had 3 surgeries on his back  . BILATERAL CARPAL TUNNEL RELEASE    . BUNIONECTOMY    . CAPD INSERTION N/A 10/30/2016   Procedure: LAPAROSCOPIC INSERTION CONTINUOUS AMBULATORY PERITONEAL DIALYSIS  (CAPD) CATHETER   OMENTOPEXY;  Surgeon: Michael Boston, MD;  Location: [facility];  Service: General;  Laterality: N/A;  . CARDIAC CATHETERIZATION  03/24/2007  . CARDIOVASCULAR STRESS TEST  04/02/2009   EF 27% AND A LARGE AREA OF INFARCT IN THE APICAL ANTERIOR WITH MILD PERI-INFARCT ISCHEMIA  . FRACTURE SURGERY     both ankles  . INSERTION OF  DIALYSIS CATHETER  10/30/2016   LAPAROSCOPIC INSERTION CONTINUOUS AMBULATORY PERITONEAL DIALYSIS  (CAPD) CATHETER   OMENTOPEXY  . JOINT REPLACEMENT     3 on right hip, 2 on knee  . KNEE SURGERY  1998   left knee  . neck surgeries (x2)    . TOE AMPUTATION     LEFT FOOT  . TOTAL KNEE ARTHROPLASTY     left knee  . TRANSTHORACIC ECHOCARDIOGRAM  04/04/2009   EF 45-50%  . TRANSURETHRAL RESECTION OF PROSTATE      Family History  Problem Relation Age of Onset  . Coronary artery disease Mother   . Emphysema Father        never smoked, worked as a Psychologist, sport and exercise    Social:  reports that he has never smoked. He has never used smokeless tobacco. He reports that he does not drink  alcohol or use drugs.  Allergies:  Allergies  Allergen Reactions  . Gentamycin [Gentamicin] Other (See Comments)    Decreased kidney fx  . Crestor [Rosuvastatin Calcium] Other (See Comments)    MUSCLE PAIN  . Hctz [Hydrochlorothiazide] Other (See Comments)    dizziness  . Lipitor [Atorvastatin Calcium] Other (See Comments)    MUSCLE PAIN    Medications: I have reviewed the patient's current medications.  Results for orders placed or performed during the hospital encounter of 11/11/16 (from the past 48 hour(s))  Glucose, capillary     Status: None   Collection Time: 11/16/16  4:38 AM  Result Value Ref Range   Glucose-Capillary 82 65 - 99 mg/dL  Glucose, capillary     Status: None   Collection Time: 11/16/16  8:47 AM  Result Value Ref Range   Glucose-Capillary 99 65 - 99 mg/dL  Heparin level (unfractionated)     Status: None   Collection Time: 11/16/16 10:08 AM  Result Value Ref Range   Heparin Unfractionated 0.54 0.30 - 0.70 IU/mL    Comment:        IF HEPARIN RESULTS ARE BELOW EXPECTED VALUES, AND PATIENT DOSAGE HAS BEEN CONFIRMED, SUGGEST FOLLOW UP TESTING OF ANTITHROMBIN III LEVELS.   CBC     Status: Abnormal   Collection Time: 11/16/16 10:08 AM  Result Value Ref Range   WBC 15.9 (H) 4.0 - 10.5 K/uL   RBC 2.41 (L) 4.22 - 5.81 MIL/uL   Hemoglobin 7.5 (L) 13.0 - 17.0 g/dL   HCT 23.4 (L) 39.0 - 52.0 %   MCV 97.1 78.0 - 100.0 fL   MCH 31.1 26.0 - 34.0 pg   MCHC 32.1 30.0 - 36.0 g/dL   RDW 16.1 (H) 11.5 - 15.5 %   Platelets 291 150 - 400 K/uL  Renal function panel     Status: Abnormal   Collection Time: 11/16/16 10:08 AM  Result Value Ref Range   Sodium 138 135 - 145 mmol/L   Potassium 4.2 3.5 - 5.1 mmol/L   Chloride 100 (L) 101 - 111 mmol/L   CO2 25 22 - 32 mmol/L   Glucose, Bld 113 (H) 65 - 99 mg/dL   BUN 50 (H) 6 - 20 mg/dL   Creatinine, Ser 3.64 (H) 0.61 - 1.24 mg/dL    Comment: DELTA CHECK NOTED   Calcium 8.3 (L) 8.9 - 10.3 mg/dL   Phosphorus 5.6 (H) 2.5  - 4.6 mg/dL   Albumin 2.2 (L) 3.5 - 5.0 g/dL   GFR calc non Af Amer 14 (L) >60 mL/min   GFR calc Af Amer 16 (L) >60 mL/min  Comment: (NOTE) The eGFR has been calculated using the CKD EPI equation. This calculation has not been validated in all clinical situations. eGFR's persistently <60 mL/min signify possible Chronic Kidney Disease.    Anion gap 13 5 - 15  Occult blood card to lab, stool     Status: Abnormal   Collection Time: 11/16/16 12:00 PM  Result Value Ref Range   Fecal Occult Bld POSITIVE (A) NEGATIVE  Glucose, capillary     Status: Abnormal   Collection Time: 11/16/16 12:05 PM  Result Value Ref Range   Glucose-Capillary 116 (H) 65 - 99 mg/dL  Glucose, capillary     Status: Abnormal   Collection Time: 11/16/16  5:02 PM  Result Value Ref Range   Glucose-Capillary 120 (H) 65 - 99 mg/dL  Prepare RBC     Status: None   Collection Time: 11/16/16  9:05 PM  Result Value Ref Range   Order Confirmation ORDER PROCESSED BY BLOOD BANK   Type and screen [place]     Status: None (Preliminary result)   Collection Time: 11/16/16  9:05 PM  Result Value Ref Range   ABO/RH(D) O POS    Antibody Screen NEG    Sample Expiration 11/19/2016    Unit Number [id number]    Blood Component Type RED CELLS,LR    Unit division 00    Status of Unit ISSUED    Transfusion Status OK TO TRANSFUSE    Crossmatch Result Compatible   Glucose, capillary     Status: Abnormal   Collection Time: 11/16/16  9:19 PM  Result Value Ref Range   Glucose-Capillary 114 (H) 65 - 99 mg/dL  Heparin level (unfractionated)     Status: Abnormal   Collection Time: 11/17/16  4:10 AM  Result Value Ref Range   Heparin Unfractionated 0.28 (L) 0.30 - 0.70 IU/mL    Comment:        IF HEPARIN RESULTS ARE BELOW EXPECTED VALUES, AND PATIENT DOSAGE HAS BEEN CONFIRMED, SUGGEST FOLLOW UP TESTING OF ANTITHROMBIN III LEVELS.   CBC     Status: Abnormal   Collection Time: 11/17/16  4:10 AM  Result  Value Ref Range   WBC 15.2 (H) 4.0 - 10.5 K/uL   RBC 2.23 (L) 4.22 - 5.81 MIL/uL   Hemoglobin 7.1 (L) 13.0 - 17.0 g/dL   HCT 21.7 (L) 39.0 - 52.0 %   MCV 97.3 78.0 - 100.0 fL   MCH 31.8 26.0 - 34.0 pg   MCHC 32.7 30.0 - 36.0 g/dL   RDW 16.6 (H) 11.5 - 15.5 %   Platelets 307 150 - 400 K/uL  Renal function panel     Status: Abnormal   Collection Time: 11/17/16  4:10 AM  Result Value Ref Range   Sodium 138 135 - 145 mmol/L   Potassium 4.3 3.5 - 5.1 mmol/L   Chloride 98 (L) 101 - 111 mmol/L   CO2 24 22 - 32 mmol/L   Glucose, Bld 108 (H) 65 - 99 mg/dL   BUN 70 (H) 6 - 20 mg/dL   Creatinine, Ser 4.66 (H) 0.61 - 1.24 mg/dL   Calcium 8.7 (L) 8.9 - 10.3 mg/dL   Phosphorus 6.2 (H) 2.5 - 4.6 mg/dL   Albumin 2.3 (L) 3.5 - 5.0 g/dL   GFR calc non Af Amer 10 (L) >60 mL/min   GFR calc Af Amer 12 (L) >60 mL/min    Comment: (NOTE) The eGFR has been calculated using the CKD EPI equation. This calculation  has not been validated in all clinical situations. eGFR's persistently <60 mL/min signify possible Chronic Kidney Disease.    Anion gap 16 (H) 5 - 15  Vancomycin, random     Status: None   Collection Time: 11/17/16  8:07 AM  Result Value Ref Range   Vancomycin Rm 15     Comment:        Random Vancomycin therapeutic range is dependent on dosage and time of specimen collection. A peak range is 20.0-40.0 ug/mL A trough range is 5.0-15.0 ug/mL          Glucose, capillary     Status: Abnormal   Collection Time: 11/17/16 11:16 AM  Result Value Ref Range   Glucose-Capillary 107 (H) 65 - 99 mg/dL  Glucose, capillary     Status: Abnormal   Collection Time: 11/17/16  4:48 PM  Result Value Ref Range   Glucose-Capillary 127 (H) 65 - 99 mg/dL  Lactic acid, plasma     Status: None   Collection Time: 11/17/16  6:13 PM  Result Value Ref Range   Lactic Acid, Venous 1.1 0.5 - 1.9 mmol/L  Procalcitonin - Baseline     Status: None   Collection Time: 11/17/16  6:13 PM  Result Value Ref Range    Procalcitonin 1.52 ng/mL    Comment:        Interpretation: PCT > 0.5 ng/mL and <= 2 ng/mL: Systemic infection (sepsis) is possible, but other conditions are known to elevate PCT as well. (NOTE)         ICU PCT Algorithm               Non ICU PCT Algorithm    ----------------------------     ------------------------------         PCT < 0.25 ng/mL                 PCT < 0.1 ng/mL     Stopping of antibiotics            Stopping of antibiotics       strongly encouraged.               strongly encouraged.    ----------------------------     ------------------------------       PCT level decrease by               PCT < 0.25 ng/mL       >= 80% from peak PCT       OR PCT 0.25 - 0.5 ng/mL          Stopping of antibiotics                                             encouraged.     Stopping of antibiotics           encouraged.    ----------------------------     ------------------------------       PCT level decrease by              PCT >= 0.25 ng/mL       < 80% from peak PCT        AND PCT >= 0.5 ng/mL             Continuing antibiotics  encouraged.       Continuing antibiotics            encouraged.    ----------------------------     ------------------------------     PCT level increase compared          PCT > 0.5 ng/mL         with peak PCT AND          PCT >= 0.5 ng/mL             Escalation of antibiotics                                          strongly encouraged.      Escalation of antibiotics        strongly encouraged.   Blood gas, arterial     Status: None   Collection Time: 11/17/16  6:15 PM  Result Value Ref Range   O2 Content 2.0 L/min   Delivery systems NASAL CANNULA    pH, Arterial 7.406 7.350 - 7.450   pCO2 arterial 42.1 32.0 - 48.0 mmHg   pO2, Arterial 93.0 83.0 - 108.0 mmHg   Bicarbonate 25.8 20.0 - 28.0 mmol/L   Acid-Base Excess 1.6 0.0 - 2.0 mmol/L   O2 Saturation 97.3 %   Patient temperature 99.2    Collection  site RIGHT RADIAL    Drawn by [id number]    Sample type ARTERIAL DRAW    Allens test (pass/fail) PASS PASS  Heparin level (unfractionated)     Status: Abnormal   Collection Time: 11/17/16  6:57 PM  Result Value Ref Range   Heparin Unfractionated >2.20 (H) 0.30 - 0.70 IU/mL    Comment: RESULTS CONFIRMED BY MANUAL DILUTION        IF HEPARIN RESULTS ARE BELOW EXPECTED VALUES, AND PATIENT DOSAGE HAS BEEN CONFIRMED, SUGGEST FOLLOW UP TESTING OF ANTITHROMBIN III LEVELS.   Ammonia     Status: None   Collection Time: 11/17/16  6:57 PM  Result Value Ref Range   Ammonia 26 9 - 35 umol/L  Protime-INR     Status: Abnormal   Collection Time: 11/17/16  6:57 PM  Result Value Ref Range   Prothrombin Time 17.1 (H) 11.4 - 15.2 seconds   INR 1.41   Troponin I     Status: Abnormal   Collection Time: 11/17/16  6:57 PM  Result Value Ref Range   Troponin I 0.73 (HH) <0.03 ng/mL    Comment: CRITICAL RESULT CALLED TO, READ BACK BY AND VERIFIED WITH: C FELTCHER,RN 2040 11/17/2016 WBOND   Lactic acid, plasma     Status: None   Collection Time: 11/17/16  9:10 PM  Result Value Ref Range   Lactic Acid, Venous 0.9 0.5 - 1.9 mmol/L  Heparin level (unfractionated)     Status: Abnormal   Collection Time: 11/17/16  9:20 PM  Result Value Ref Range   Heparin Unfractionated <0.10 (L) 0.30 - 0.70 IU/mL    Comment:        IF HEPARIN RESULTS ARE BELOW EXPECTED VALUES, AND PATIENT DOSAGE HAS BEEN CONFIRMED, SUGGEST FOLLOW UP TESTING OF ANTITHROMBIN III LEVELS.   Glucose, capillary     Status: Abnormal   Collection Time: 11/17/16  9:31 PM  Result Value Ref Range   Glucose-Capillary 110 (H) 65 - 99 mg/dL  CBC with Differential/Platelet     Status: Abnormal   Collection  Time: 11/17/16 10:00 PM  Result Value Ref Range   WBC 15.6 (H) 4.0 - 10.5 K/uL   RBC 2.55 (L) 4.22 - 5.81 MIL/uL   Hemoglobin 8.0 (L) 13.0 - 17.0 g/dL   HCT 24.5 (L) 39.0 - 52.0 %   MCV 96.1 78.0 - 100.0 fL   MCH 31.4 26.0 - 34.0 pg   MCHC  32.7 30.0 - 36.0 g/dL   RDW 16.5 (H) 11.5 - 15.5 %   Platelets 310 150 - 400 K/uL   Neutrophils Relative % 80 %   Neutro Abs 12.3 (H) 1.7 - 7.7 K/uL   Lymphocytes Relative 8 %   Lymphs Abs 1.3 0.7 - 4.0 K/uL   Monocytes Relative 11 %   Monocytes Absolute 1.8 (H) 0.1 - 1.0 K/uL   Eosinophils Relative 1 %   Eosinophils Absolute 0.2 0.0 - 0.7 K/uL   Basophils Relative 0 %   Basophils Absolute 0.1 0.0 - 0.1 K/uL    Ct Abdomen Pelvis Wo Contrast  Result Date: 11/17/2016 CLINICAL DATA:  83 year old male with abdominal pain and tenderness. Concern for hematoma. EXAM: CT ABDOMEN AND PELVIS WITHOUT CONTRAST TECHNIQUE: Multidetector CT imaging of the abdomen and pelvis was performed following the standard protocol without IV contrast. COMPARISON:  Renal ultrasound dated 04/19/2014 FINDINGS: Evaluation of this exam is limited in the absence of intravenous contrast. Evaluation is also limited due to streak artifact caused by metallic right hip arthroplasty as well as posterior spinal fixation screws. Evaluation is also limited due to motion artifact. Lower chest: Probable small bilateral pleural effusions with associated partial consolidative changes of the lung bases which may represent compressive atelectasis versus pneumonia. Clinical correlation is recommended. There is mild cardiomegaly. Multi vessel coronary vascular calcification noted. There is hypoattenuation of the cardiac blood pool suggestive of a degree of anemia. Clinical correlation is recommended. There is no intra-abdominal free air. A small pocket of intraperitoneal air in the anterior abdomen (series 3, image 28) noted. Hepatobiliary: The liver and gallbladder appear grossly unremarkable on this noncontrast CT. No intrahepatic biliary ductal dilatation. Pancreas: The pancreas is atrophic otherwise unremarkable. Spleen: Normal in size without focal abnormality. Adrenals/Urinary Tract: The adrenal glands are grossly unremarkable. Bilateral  renal atrophy. Multiple bilateral renal lesions measuring up to 2 cm on the left are not well characterized on the CT. There is no hydronephrosis or nephrolithiasis on either side. The visualized ureters appear unremarkable. The urinary bladder is collapsed and not well visualized. Stomach/Bowel: There is extensive sigmoid diverticulosis with muscular hypertrophy. Mild stranding of the right hemipelvis adjacent to the sigmoid likely related to inflammatory changes of the anterior abdominal wall and less likely represent diverticulitis. Evaluation of this area is very limited due to streak artifact. Correlation with clinical exam is recommended. There is no bowel obstruction. Normal appendix. Vascular/Lymphatic: There is advanced atherosclerotic calcification of the aorta and iliac vessels as well as atherosclerotic calcification of the mesenteric vasculature. The IVC is grossly unremarkable. No portal venous gas identified. There is no adenopathy. Reproductive: The prostate and seminal vesicles are not well visualized. A Foley catheter is seen with balloon likely at the base of the bladder. Other: A percutaneous tube enters the left anterior abdominal wall. The tube enters the upper peritoneal cavity and appears to penetrate the distal stomach and traverse the anterior wall of the antrum. The tube re- enters the right anterior abdominal wall and takes a caudad coarse in the subcutaneous soft tissues anterior to the right rectus muscle. The tube  enters the distal aspect of the right rectus muscle and 3 enters into the peritoneal cavity with coiled tip located in the pelvis. There is a 10 x 5 x 11 cm right rectus sheath hematoma superior to the level of the umbilicus. Musculoskeletal: There is osteopenia with extensive degenerative changes of the spine. Lower thoracic and lumbosacral posterior fusion hardware noted. There is a total right hip arthroplasty. There is chronic deformity of the right hip with areas of  cystic formation surrounding the arthroplasty which may represent loosening or related to particle disease. An infectious process is not excluded. There is remodeling of the medial wall of the iliac bone with protrusion of the hardware. There is extension of the uppermost screw of the acetabular cup through the iliac bone and into the right iliacus muscle. IMPRESSION: 1. Peritoneal dialysis catheter enters the left upper anterior abdominal wall and appears to penetrate into the lumen of the distal stomach. The catheter subsequently exits the wall of the stomach and re-enters the right anterior abdominal wall taking a caudad coarse along the right rectus abdominus muscle. The catheter then traverses the inferior right rectus abdominis and enters the peritoneal cavity within the pelvis with tip coiled in the pelvic floor. 2. Small pockets of extraluminal air noted in the anterior upper abdomen adjacent to the gastric antrum where the catheter enters the stomach. 3. Large right rectus sheath hematoma. 4. Right hip arthroplasty with chronic deformity of the right hip and findings suggestive of arthroplasty loosening or particle disease. There is remodeling of the medial wall of the right acetabulum with protrusion of the arthroplasty. The uppermost screw of the acetabular cup component of the arthroplasty penetrates through the bone into the right iliacus musculature. 5. Other nonemergent findings including extensive diverticulosis, atherosclerosis, and extensive degenerative changes of the spine. These results were called by telephone at the time of interpretation on 11/17/2016 at 9:44 pm to Dr. Arsenio Loader, who verbally acknowledged these results. Electronically Signed   By: Elgie Collard M.D.   On: 11/17/2016 22:31   Ct Head Wo Contrast  Result Date: 11/17/2016 CLINICAL DATA:  83 y/o  M; altered mental status. EXAM: CT HEAD WITHOUT CONTRAST TECHNIQUE: Contiguous axial images were obtained from the base of the  skull through the vertex without intravenous contrast. COMPARISON:  10/31/2016 CT of the head. FINDINGS: Brain: Interval development of lucency within the right superior putamen extending into mid corona radiata compatible with late acute or subacute infarction. No evidence for large vascular territory acute infarction, acute intracranial hemorrhage, or focal mass effect. No hydrocephalus or extra-axial collection. Stable chronic microvascular ischemic changes and parenchymal volume loss of the brain. Vascular: Extensive calcific atherosclerosis of carotid siphons. Skull: No acute osseous abnormality identified. Sinuses/Orbits: No acute finding. Other: Right temporalis region postsurgical changes are stable. IMPRESSION: 1. Interval small hypodensity within the right superior putamen extending into mid corona radiata, likely late acute or subacute infarction. 2. No acute hemorrhage or focal mass effect. 3. Stable chronic microvascular ischemic changes and parenchymal volume loss of the brain. These results will be called to the ordering clinician or representative by the Radiologist Assistant, and communication documented in the PACS or zVision Dashboard. Electronically Signed   By: Mitzi Hansen M.D.   On: 11/17/2016 20:47   Dg Chest Port 1 View  Result Date: 11/17/2016 CLINICAL DATA:  Hypertension.  CHF. EXAM: PORTABLE CHEST 1 VIEW COMPARISON:  11/15/2016 FINDINGS: 1729 hours. Low lung volumes. The cardio pericardial silhouette is enlarged. The right infrahilar  lung opacity persists and is stable. Retrocardiac collapse/ consolidation again noted. Probable tiny bilateral pleural effusions. Left subclavian central line tip overlies the proximal SVC. Thoracolumbar fusion hardware noted. Telemetry leads overlie the chest. Degenerative changes evident in both shoulders. IMPRESSION: 1. Interval progression of retrocardiac left base collapse/consolidation. 2. Tiny bilateral pleural effusions. 3. Stable  hazy infrahilar opacity at the right lung base. Continued follow-up recommended to ensure resolution. Electronically Signed   By: Misty Stanley M.D.   On: 11/17/2016 19:39    ROS - all of the below systems have been reviewed with the patient and positives are indicated with bold text General: chills, fever or night sweats Eyes: blurry vision or double vision ENT: epistaxis or sore throat Allergy/Immunology: itchy/watery eyes or nasal congestion Hematologic/Lymphatic: bleeding problems, blood clots or swollen lymph nodes Endocrine: temperature intolerance or unexpected weight changes Breast: new or changing breast lumps or nipple discharge Resp: cough, shortness of breath, or wheezing CV: chest pain or dyspnea on exertion GI: as per HPI GU: dysuria, trouble voiding, or hematuria MSK: joint pain or joint stiffness Neuro: TIA or stroke symptoms Derm: pruritus and skin lesion changes Psych: anxiety and depression  PE Blood pressure 123/79, pulse 65, temperature 97.9 F (36.6 C), temperature source Oral, resp. rate (!) 26, height '5\' 4"'$  (1.626 m), weight 78.6 kg (173 lb 4.5 oz), SpO2 100 %. Gen: NAD, comfortable CV: RRR Pulm: Normal work of breathing GI: Abdomen - palpable firm mass in right abdominal wall consistent with the known hematoma; no rebound/guarding; no left sided abdominal tenderness Ext: No pitting edema Results for orders placed or performed during the hospital encounter of 11/11/16 (from the past 48 hour(s))  Glucose, capillary     Status: None   Collection Time: 11/16/16  4:38 AM  Result Value Ref Range   Glucose-Capillary 82 65 - 99 mg/dL  Glucose, capillary     Status: None   Collection Time: 11/16/16  8:47 AM  Result Value Ref Range   Glucose-Capillary 99 65 - 99 mg/dL  Heparin level (unfractionated)     Status: None   Collection Time: 11/16/16 10:08 AM  Result Value Ref Range   Heparin Unfractionated 0.54 0.30 - 0.70 IU/mL    Comment:        IF HEPARIN RESULTS  ARE BELOW EXPECTED VALUES, AND PATIENT DOSAGE HAS BEEN CONFIRMED, SUGGEST FOLLOW UP TESTING OF ANTITHROMBIN III LEVELS.   CBC     Status: Abnormal   Collection Time: 11/16/16 10:08 AM  Result Value Ref Range   WBC 15.9 (H) 4.0 - 10.5 K/uL   RBC 2.41 (L) 4.22 - 5.81 MIL/uL   Hemoglobin 7.5 (L) 13.0 - 17.0 g/dL   HCT 23.4 (L) 39.0 - 52.0 %   MCV 97.1 78.0 - 100.0 fL   MCH 31.1 26.0 - 34.0 pg   MCHC 32.1 30.0 - 36.0 g/dL   RDW 16.1 (H) 11.5 - 15.5 %   Platelets 291 150 - 400 K/uL  Renal function panel     Status: Abnormal   Collection Time: 11/16/16 10:08 AM  Result Value Ref Range   Sodium 138 135 - 145 mmol/L   Potassium 4.2 3.5 - 5.1 mmol/L   Chloride 100 (L) 101 - 111 mmol/L   CO2 25 22 - 32 mmol/L   Glucose, Bld 113 (H) 65 - 99 mg/dL   BUN 50 (H) 6 - 20 mg/dL   Creatinine, Ser 3.64 (H) 0.61 - 1.24 mg/dL    Comment: DELTA CHECK  NOTED   Calcium 8.3 (L) 8.9 - 10.3 mg/dL   Phosphorus 5.6 (H) 2.5 - 4.6 mg/dL   Albumin 2.2 (L) 3.5 - 5.0 g/dL   GFR calc non Af Amer 14 (L) >60 mL/min   GFR calc Af Amer 16 (L) >60 mL/min    Comment: (NOTE) The eGFR has been calculated using the CKD EPI equation. This calculation has not been validated in all clinical situations. eGFR's persistently <60 mL/min signify possible Chronic Kidney Disease.    Anion gap 13 5 - 15  Occult blood card to lab, stool     Status: Abnormal   Collection Time: 11/16/16 12:00 PM  Result Value Ref Range   Fecal Occult Bld POSITIVE (A) NEGATIVE  Glucose, capillary     Status: Abnormal   Collection Time: 11/16/16 12:05 PM  Result Value Ref Range   Glucose-Capillary 116 (H) 65 - 99 mg/dL  Glucose, capillary     Status: Abnormal   Collection Time: 11/16/16  5:02 PM  Result Value Ref Range   Glucose-Capillary 120 (H) 65 - 99 mg/dL  Prepare RBC     Status: None   Collection Time: 11/16/16  9:05 PM  Result Value Ref Range   Order Confirmation ORDER PROCESSED BY BLOOD BANK   Type and screen  [place]     Status: None (Preliminary result)   Collection Time: 11/16/16  9:05 PM  Result Value Ref Range   ABO/RH(D) O POS    Antibody Screen NEG    Sample Expiration 11/19/2016    Unit Number [id number]    Blood Component Type RED CELLS,LR    Unit division 00    Status of Unit ISSUED    Transfusion Status OK TO TRANSFUSE    Crossmatch Result Compatible   Glucose, capillary     Status: Abnormal   Collection Time: 11/16/16  9:19 PM  Result Value Ref Range   Glucose-Capillary 114 (H) 65 - 99 mg/dL  Heparin level (unfractionated)     Status: Abnormal   Collection Time: 11/17/16  4:10 AM  Result Value Ref Range   Heparin Unfractionated 0.28 (L) 0.30 - 0.70 IU/mL    Comment:        IF HEPARIN RESULTS ARE BELOW EXPECTED VALUES, AND PATIENT DOSAGE HAS BEEN CONFIRMED, SUGGEST FOLLOW UP TESTING OF ANTITHROMBIN III LEVELS.   CBC     Status: Abnormal   Collection Time: 11/17/16  4:10 AM  Result Value Ref Range   WBC 15.2 (H) 4.0 - 10.5 K/uL   RBC 2.23 (L) 4.22 - 5.81 MIL/uL   Hemoglobin 7.1 (L) 13.0 - 17.0 g/dL   HCT 21.7 (L) 39.0 - 52.0 %   MCV 97.3 78.0 - 100.0 fL   MCH 31.8 26.0 - 34.0 pg   MCHC 32.7 30.0 - 36.0 g/dL   RDW 16.6 (H) 11.5 - 15.5 %   Platelets 307 150 - 400 K/uL  Renal function panel     Status: Abnormal   Collection Time: 11/17/16  4:10 AM  Result Value Ref Range   Sodium 138 135 - 145 mmol/L   Potassium 4.3 3.5 - 5.1 mmol/L   Chloride 98 (L) 101 - 111 mmol/L   CO2 24 22 - 32 mmol/L   Glucose, Bld 108 (H) 65 - 99 mg/dL   BUN 70 (H) 6 - 20 mg/dL   Creatinine, Ser 4.66 (H) 0.61 - 1.24 mg/dL   Calcium 8.7 (L) 8.9 - 10.3 mg/dL   Phosphorus 6.2 (  H) 2.5 - 4.6 mg/dL   Albumin 2.3 (L) 3.5 - 5.0 g/dL   GFR calc non Af Amer 10 (L) >60 mL/min   GFR calc Af Amer 12 (L) >60 mL/min    Comment: (NOTE) The eGFR has been calculated using the CKD EPI equation. This calculation has not been validated in all clinical situations. eGFR's persistently <60 mL/min signify  possible Chronic Kidney Disease.    Anion gap 16 (H) 5 - 15  Vancomycin, random     Status: None   Collection Time: 11/17/16  8:07 AM  Result Value Ref Range   Vancomycin Rm 15     Comment:        Random Vancomycin therapeutic range is dependent on dosage and time of specimen collection. A peak range is 20.0-40.0 ug/mL A trough range is 5.0-15.0 ug/mL          Glucose, capillary     Status: Abnormal   Collection Time: 11/17/16 11:16 AM  Result Value Ref Range   Glucose-Capillary 107 (H) 65 - 99 mg/dL  Glucose, capillary     Status: Abnormal   Collection Time: 11/17/16  4:48 PM  Result Value Ref Range   Glucose-Capillary 127 (H) 65 - 99 mg/dL  Lactic acid, plasma     Status: None   Collection Time: 11/17/16  6:13 PM  Result Value Ref Range   Lactic Acid, Venous 1.1 0.5 - 1.9 mmol/L  Procalcitonin - Baseline     Status: None   Collection Time: 11/17/16  6:13 PM  Result Value Ref Range   Procalcitonin 1.52 ng/mL    Comment:        Interpretation: PCT > 0.5 ng/mL and <= 2 ng/mL: Systemic infection (sepsis) is possible, but other conditions are known to elevate PCT as well. (NOTE)         ICU PCT Algorithm               Non ICU PCT Algorithm    ----------------------------     ------------------------------         PCT < 0.25 ng/mL                 PCT < 0.1 ng/mL     Stopping of antibiotics            Stopping of antibiotics       strongly encouraged.               strongly encouraged.    ----------------------------     ------------------------------       PCT level decrease by               PCT < 0.25 ng/mL       >= 80% from peak PCT       OR PCT 0.25 - 0.5 ng/mL          Stopping of antibiotics                                             encouraged.     Stopping of antibiotics           encouraged.    ----------------------------     ------------------------------       PCT level decrease by              PCT >= 0.25 ng/mL       <  80% from peak PCT        AND PCT >=  0.5 ng/mL             Continuing antibiotics                                              encouraged.       Continuing antibiotics            encouraged.    ----------------------------     ------------------------------     PCT level increase compared          PCT > 0.5 ng/mL         with peak PCT AND          PCT >= 0.5 ng/mL             Escalation of antibiotics                                          strongly encouraged.      Escalation of antibiotics        strongly encouraged.   Blood gas, arterial     Status: None   Collection Time: 11/17/16  6:15 PM  Result Value Ref Range   O2 Content 2.0 L/min   Delivery systems NASAL CANNULA    pH, Arterial 7.406 7.350 - 7.450   pCO2 arterial 42.1 32.0 - 48.0 mmHg   pO2, Arterial 93.0 83.0 - 108.0 mmHg   Bicarbonate 25.8 20.0 - 28.0 mmol/L   Acid-Base Excess 1.6 0.0 - 2.0 mmol/L   O2 Saturation 97.3 %   Patient temperature 99.2    Collection site RIGHT RADIAL    Drawn by [id number]    Sample type ARTERIAL DRAW    Allens test (pass/fail) PASS PASS  Heparin level (unfractionated)     Status: Abnormal   Collection Time: 11/17/16  6:57 PM  Result Value Ref Range   Heparin Unfractionated >2.20 (H) 0.30 - 0.70 IU/mL    Comment: RESULTS CONFIRMED BY MANUAL DILUTION        IF HEPARIN RESULTS ARE BELOW EXPECTED VALUES, AND PATIENT DOSAGE HAS BEEN CONFIRMED, SUGGEST FOLLOW UP TESTING OF ANTITHROMBIN III LEVELS.   Ammonia     Status: None   Collection Time: 11/17/16  6:57 PM  Result Value Ref Range   Ammonia 26 9 - 35 umol/L  Protime-INR     Status: Abnormal   Collection Time: 11/17/16  6:57 PM  Result Value Ref Range   Prothrombin Time 17.1 (H) 11.4 - 15.2 seconds   INR 1.41   Troponin I     Status: Abnormal   Collection Time: 11/17/16  6:57 PM  Result Value Ref Range   Troponin I 0.73 (HH) <0.03 ng/mL    Comment: CRITICAL RESULT CALLED TO, READ BACK BY AND VERIFIED WITH: C FELTCHER,RN 2040 11/17/2016 WBOND   Lactic acid, plasma      Status: None   Collection Time: 11/17/16  9:10 PM  Result Value Ref Range   Lactic Acid, Venous 0.9 0.5 - 1.9 mmol/L  Heparin level (unfractionated)     Status: Abnormal   Collection Time: 11/17/16  9:20 PM  Result Value Ref Range   Heparin Unfractionated <0.10 (L) 0.30 - 0.70 IU/mL  Comment:        IF HEPARIN RESULTS ARE BELOW EXPECTED VALUES, AND PATIENT DOSAGE HAS BEEN CONFIRMED, SUGGEST FOLLOW UP TESTING OF ANTITHROMBIN III LEVELS.   Glucose, capillary     Status: Abnormal   Collection Time: 11/17/16  9:31 PM  Result Value Ref Range   Glucose-Capillary 110 (H) 65 - 99 mg/dL  CBC with Differential/Platelet     Status: Abnormal   Collection Time: 11/17/16 10:00 PM  Result Value Ref Range   WBC 15.6 (H) 4.0 - 10.5 K/uL   RBC 2.55 (L) 4.22 - 5.81 MIL/uL   Hemoglobin 8.0 (L) 13.0 - 17.0 g/dL   HCT 24.5 (L) 39.0 - 52.0 %   MCV 96.1 78.0 - 100.0 fL   MCH 31.4 26.0 - 34.0 pg   MCHC 32.7 30.0 - 36.0 g/dL   RDW 16.5 (H) 11.5 - 15.5 %   Platelets 310 150 - 400 K/uL   Neutrophils Relative % 80 %   Neutro Abs 12.3 (H) 1.7 - 7.7 K/uL   Lymphocytes Relative 8 %   Lymphs Abs 1.3 0.7 - 4.0 K/uL   Monocytes Relative 11 %   Monocytes Absolute 1.8 (H) 0.1 - 1.0 K/uL   Eosinophils Relative 1 %   Eosinophils Absolute 0.2 0.0 - 0.7 K/uL   Basophils Relative 0 %   Basophils Absolute 0.1 0.0 - 0.1 K/uL    Ct Abdomen Pelvis Wo Contrast  Result Date: 11/17/2016 CLINICAL DATA:  83 year old male with abdominal pain and tenderness. Concern for hematoma. EXAM: CT ABDOMEN AND PELVIS WITHOUT CONTRAST TECHNIQUE: Multidetector CT imaging of the abdomen and pelvis was performed following the standard protocol without IV contrast. COMPARISON:  Renal ultrasound dated 04/19/2014 FINDINGS: Evaluation of this exam is limited in the absence of intravenous contrast. Evaluation is also limited due to streak artifact caused by metallic right hip arthroplasty as well as posterior spinal fixation screws.  Evaluation is also limited due to motion artifact. Lower chest: Probable small bilateral pleural effusions with associated partial consolidative changes of the lung bases which may represent compressive atelectasis versus pneumonia. Clinical correlation is recommended. There is mild cardiomegaly. Multi vessel coronary vascular calcification noted. There is hypoattenuation of the cardiac blood pool suggestive of a degree of anemia. Clinical correlation is recommended. There is no intra-abdominal free air. A small pocket of intraperitoneal air in the anterior abdomen (series 3, image 28) noted. Hepatobiliary: The liver and gallbladder appear grossly unremarkable on this noncontrast CT. No intrahepatic biliary ductal dilatation. Pancreas: The pancreas is atrophic otherwise unremarkable. Spleen: Normal in size without focal abnormality. Adrenals/Urinary Tract: The adrenal glands are grossly unremarkable. Bilateral renal atrophy. Multiple bilateral renal lesions measuring up to 2 cm on the left are not well characterized on the CT. There is no hydronephrosis or nephrolithiasis on either side. The visualized ureters appear unremarkable. The urinary bladder is collapsed and not well visualized. Stomach/Bowel: There is extensive sigmoid diverticulosis with muscular hypertrophy. Mild stranding of the right hemipelvis adjacent to the sigmoid likely related to inflammatory changes of the anterior abdominal wall and less likely represent diverticulitis. Evaluation of this area is very limited due to streak artifact. Correlation with clinical exam is recommended. There is no bowel obstruction. Normal appendix. Vascular/Lymphatic: There is advanced atherosclerotic calcification of the aorta and iliac vessels as well as atherosclerotic calcification of the mesenteric vasculature. The IVC is grossly unremarkable. No portal venous gas identified. There is no adenopathy. Reproductive: The prostate and seminal vesicles are not well  visualized. A Foley catheter is seen with balloon likely at the base of the bladder. Other: A percutaneous tube enters the left anterior abdominal wall. The tube enters the upper peritoneal cavity and appears to penetrate the distal stomach and traverse the anterior wall of the antrum. The tube re- enters the right anterior abdominal wall and takes a caudad coarse in the subcutaneous soft tissues anterior to the right rectus muscle. The tube enters the distal aspect of the right rectus muscle and 3 enters into the peritoneal cavity with coiled tip located in the pelvis. There is a 10 x 5 x 11 cm right rectus sheath hematoma superior to the level of the umbilicus. Musculoskeletal: There is osteopenia with extensive degenerative changes of the spine. Lower thoracic and lumbosacral posterior fusion hardware noted. There is a total right hip arthroplasty. There is chronic deformity of the right hip with areas of cystic formation surrounding the arthroplasty which may represent loosening or related to particle disease. An infectious process is not excluded. There is remodeling of the medial wall of the iliac bone with protrusion of the hardware. There is extension of the uppermost screw of the acetabular cup through the iliac bone and into the right iliacus muscle. IMPRESSION: 1. Peritoneal dialysis catheter enters the left upper anterior abdominal wall and appears to penetrate into the lumen of the distal stomach. The catheter subsequently exits the wall of the stomach and re-enters the right anterior abdominal wall taking a caudad coarse along the right rectus abdominus muscle. The catheter then traverses the inferior right rectus abdominis and enters the peritoneal cavity within the pelvis with tip coiled in the pelvic floor. 2. Small pockets of extraluminal air noted in the anterior upper abdomen adjacent to the gastric antrum where the catheter enters the stomach. 3. Large right rectus sheath hematoma. 4. Right hip  arthroplasty with chronic deformity of the right hip and findings suggestive of arthroplasty loosening or particle disease. There is remodeling of the medial wall of the right acetabulum with protrusion of the arthroplasty. The uppermost screw of the acetabular cup component of the arthroplasty penetrates through the bone into the right iliacus musculature. 5. Other nonemergent findings including extensive diverticulosis, atherosclerosis, and extensive degenerative changes of the spine. These results were called by telephone at the time of interpretation on 11/17/2016 at 9:44 pm to Dr. Oletta Darter, who verbally acknowledged these results. Electronically Signed   By: Anner Crete M.D.   On: 11/17/2016 22:31   Ct Head Wo Contrast  Result Date: 11/17/2016 CLINICAL DATA:  83 y/o  M; altered mental status. EXAM: CT HEAD WITHOUT CONTRAST TECHNIQUE: Contiguous axial images were obtained from the base of the skull through the vertex without intravenous contrast. COMPARISON:  10/31/2016 CT of the head. FINDINGS: Brain: Interval development of lucency within the right superior putamen extending into mid corona radiata compatible with late acute or subacute infarction. No evidence for large vascular territory acute infarction, acute intracranial hemorrhage, or focal mass effect. No hydrocephalus or extra-axial collection. Stable chronic microvascular ischemic changes and parenchymal volume loss of the brain. Vascular: Extensive calcific atherosclerosis of carotid siphons. Skull: No acute osseous abnormality identified. Sinuses/Orbits: No acute finding. Other: Right temporalis region postsurgical changes are stable. IMPRESSION: 1. Interval small hypodensity within the right superior putamen extending into mid corona radiata, likely late acute or subacute infarction. 2. No acute hemorrhage or focal mass effect. 3. Stable chronic microvascular ischemic changes and parenchymal volume loss of the brain. These results will be  called to the ordering clinician or representative by the Radiologist Assistant, and communication documented in the PACS or zVision Dashboard. Electronically Signed   By: Kristine Garbe M.D.   On: 11/17/2016 20:47   Dg Chest Port 1 View  Result Date: 11/17/2016 CLINICAL DATA:  Hypertension.  CHF. EXAM: PORTABLE CHEST 1 VIEW COMPARISON:  11/15/2016 FINDINGS: 1729 hours. Low lung volumes. The cardio pericardial silhouette is enlarged. The right infrahilar lung opacity persists and is stable. Retrocardiac collapse/ consolidation again noted. Probable tiny bilateral pleural effusions. Left subclavian central line tip overlies the proximal SVC. Thoracolumbar fusion hardware noted. Telemetry leads overlie the chest. Degenerative changes evident in both shoulders. IMPRESSION: 1. Interval progression of retrocardiac left base collapse/consolidation. 2. Tiny bilateral pleural effusions. 3. Stable hazy infrahilar opacity at the right lung base. Continued follow-up recommended to ensure resolution. Electronically Signed   By: Misty Stanley M.D.   On: 11/17/2016 19:39   A/P: 32M hx CAD, CHF (EF 25-30% on new TTE, down from 40-45% previously), HTN, ESRD, currently being treated for NSTEMI, who had recent PD cathter placed 10/11 -His vital signs are currently normal; leukocytosis which occurred around time of NSTEMI has been down-trending; his abdominal exam is only remarkable for the rectus sheath hematoma; there is no clinical signs of peritonitis. Micro-foci of extraluminal air which can also be seen with prior PD sessions. He is currently being treated for NSTEMI -Agree with stopping anticoagulation and trending hgb -Transfuse to hgb goal of 10 -NPO -I discussed at length with the patient and daughter the findings and it not being completely clear at this time whether the PD cathetter actually enters the wall of the stomach vs is invaginating it. Options going forward to further clarify include having  GI perform an EGD to further evaluate. At this point, she's unsure if this is even a procedure he would want to undergo and she would like time to discuss this with the other family members -We will continue to follow  Sharon Mt. Dema Severin, M.D. General and Colorectal Surgery [facility]

## 2016-11-18 NOTE — Progress Notes (Signed)
Palliative:  Full note to follow. Met at patient's bedside with wife and daughter. Daughter is a Marine scientist and works part time at [employer]. After discussion we have agreed to not proceed with further testing with EGD. Understand that continuing dialysis longer term is not a good option. Hope is to transition amiodarone to po and to have at least one more dialysis treatment to optimize him and then transition to hospice facility [facility]. I will add trazodone 25 mg po tonight for sleep. I will follow up in the morning. Thank you for this consult.   Vinie Sill, NP Palliative Medicine Team Pager # [phone] (M-F 8a-5p) Team Phone # [phone] (Nights/Weekends)

## 2016-11-18 NOTE — Progress Notes (Signed)
PT Cancellation Note  Patient Details Name: [name] MRN: [id number] DOB: 09/11/1928   Cancelled Treatment:    Reason Eval/Treat Not Completed: Patient not medically ready;Patient declined, no reason specified Family declined working with PT. Pt not verbally answering any questions during encounter. Palliative care getting involved. Will follow for appropriateness.    Marguarite Arbour A Krislyn Donnan 11/18/2016, 12:17 PM Wray Kearns, [place], DPT [phone]

## 2016-11-18 NOTE — Progress Notes (Signed)
PULMONARY / CRITICAL CARE MEDICINE   Name: [name] MRN: [id number] DOB: 04/25/1928    ADMISSION DATE:  11/11/2016 CONSULTATION DATE:  11/15/16  REFERRING MD:  Dr. Wendee Beavers / [facility]   CHIEF COMPLAINT:  Dyspnea  BRIEF SUMMARY:   83 y/o M with a complex medical history admitted 10/23 with dyspnea and A. Fib with RVR.   On 10/11, the patient underwent peritoneal dialysis catheter placement by Dr. Johney Maine.  Family reports after dialysis after placement he was altered/confused.  CT of the head on 10/12 revealed no acute abnormality but chronic microvascular disease.  At baseline he lives at home with his wife who is a retired OB nurse.he does require assistance with bathing and uses a walker. A typical day for him involves him getting up and sitting at the kitchen table to watch TV and read. Family reports since his surgery for PD catheter placement he has not been at his baseline.  They indicate he has been somewhat confused, anxious and fidgety. His daughter states she took him to the bathroom on the day prior to presentation on 10/23 and he had a bowel movement that smelled like melena (she is a Equities trader as well).  The patient was initially admitted for evaluation of A. Fib with RVR. He was found to have an NSTEMI with a peak troponin of 1.92 (this was the last assessed troponin). He also had an echocardiogram which demonstrated a reduction in his LVEF to 25-30%.  Prior assessment was 40-45% 2015.  During the hospitalization the patient has had progressive anemia. His preoperative hemoglobin was 12.1.  Currently he is 7.1 and received 1 unit of packed cells during hemodialysis.  Family reports they had a difficult time placing a temporary hemodialysis catheter with 2 attempts.  He has not tolerated peritoneal dialysis thus far. Lab review shows an anion gap metabolic acidosis (20 corrected for albumin).  He has had tachypnea and concerns for respiratory distress intermittently requiring BiPAP.     PCCM called back for reevaluation of respiratory status 10/29.   SUBJECTIVE / Interval events:   Only responds intermittently to questions, denies significant abdominal pain, Stoic CT head, CT abdomen as below; reviewed Received 1 unit PRBC overnight with dialysis  VITAL SIGNS: BP 125/64   Pulse 90   Temp 99.1 F (37.3 C) (Oral)   Resp (!) 33   Ht 5\' 4"  (1.626 m)   Wt 77.6 kg (171 lb 1.2 oz)   SpO2 95%   BMI 29.37 kg/m   HEMODYNAMICS:    VENTILATOR SETTINGS:    INTAKE / OUTPUT: I/O last 3 completed shifts: In: 1788.8 [I.V.:1323.8; Blood:315; IV Piggyback:150] Out: 2250 [Urine:250; Other:2000]  PHYSICAL EXAMINATION: General: Chronically ill, comfortable, no distress, lying in bed HEENT: Oropharynx clear, mild right-sided facial droop noted PSY: Awake, alert, calm Neuro: Poorly oriented, able to answer simple questions, moves bilateral upper extremities CV: Irregularly irregular, no murmur heard PULM: Decreased at bilateral bases, mild bilateral inspiratory crackles, no wheezing GI: Mild diffuse tenderness, no rebound, firm raised midline hematoma noted Extremities: No edema Skin: No rash   LABS:  BMET  Recent Labs Lab 11/16/16 1008 11/17/16 0410 11/18/16 0529  NA 138 138 136  K 4.2 4.3 3.7  CL 100* 98* 98*  CO2 25 24 24   BUN 50* 70* 55*  CREATININE 3.64* 4.66* 4.20*  GLUCOSE 113* 108* 102*    Electrolytes  Recent Labs Lab 11/16/16 1008 11/17/16 0410 11/18/16 0529  CALCIUM 8.3* 8.7* 8.6*  PHOS  5.6* 6.2* 5.6*    CBC  Recent Labs Lab 11/17/16 0410 11/17/16 2200 11/18/16 0529  WBC 15.2* 15.6* 15.5*  HGB 7.1* 8.0* 8.1*  HCT 21.7* 24.5* 24.9*  PLT 307 310 304    Coag's  Recent Labs Lab 11/15/16 1243 11/17/16 1857  APTT 34  --   INR  --  1.41    Sepsis Markers  Recent Labs Lab 11/13/16 0350 11/17/16 1813 11/17/16 2110 11/18/16 0529  LATICACIDVEN  --  1.1 0.9  --   PROCALCITON 0.25 1.52  --  1.66    ABG  Recent  Labs Lab 11/13/16 0128 11/17/16 1815  PHART 7.336* 7.406  PCO2ART 45.2 42.1  PO2ART 135* 93.0    Liver Enzymes  Recent Labs Lab 11/14/16 1209  11/16/16 1008 11/17/16 0410 11/18/16 0529  AST 20  --   --   --   --   ALT 10*  --   --   --   --   ALKPHOS 90  --   --   --   --   BILITOT 0.9  --   --   --   --   ALBUMIN 2.7*  < > 2.2* 2.3* 2.1*  < > = values in this interval not displayed.  Cardiac Enzymes  Recent Labs Lab 11/12/16 0436 11/12/16 1559 11/17/16 1857  TROPONINI 1.74* 1.96* 0.73*    Glucose  Recent Labs Lab 11/16/16 1702 11/16/16 2119 11/17/16 1116 11/17/16 1648 11/17/16 2131 11/18/16 0733  GLUCAP 120* 114* 107* 127* 110* 107*    Imaging Ct Abdomen Pelvis Wo Contrast  Result Date: 11/17/2016 CLINICAL DATA:  83 year old male with abdominal pain and tenderness. Concern for hematoma. EXAM: CT ABDOMEN AND PELVIS WITHOUT CONTRAST TECHNIQUE: Multidetector CT imaging of the abdomen and pelvis was performed following the standard protocol without IV contrast. COMPARISON:  Renal ultrasound dated 04/19/2014 FINDINGS: Evaluation of this exam is limited in the absence of intravenous contrast. Evaluation is also limited due to streak artifact caused by metallic right hip arthroplasty as well as posterior spinal fixation screws. Evaluation is also limited due to motion artifact. Lower chest: Probable small bilateral pleural effusions with associated partial consolidative changes of the lung bases which may represent compressive atelectasis versus pneumonia. Clinical correlation is recommended. There is mild cardiomegaly. Multi vessel coronary vascular calcification noted. There is hypoattenuation of the cardiac blood pool suggestive of a degree of anemia. Clinical correlation is recommended. There is no intra-abdominal free air. A small pocket of intraperitoneal air in the anterior abdomen (series 3, image 28) noted. Hepatobiliary: The liver and gallbladder appear grossly  unremarkable on this noncontrast CT. No intrahepatic biliary ductal dilatation. Pancreas: The pancreas is atrophic otherwise unremarkable. Spleen: Normal in size without focal abnormality. Adrenals/Urinary Tract: The adrenal glands are grossly unremarkable. Bilateral renal atrophy. Multiple bilateral renal lesions measuring up to 2 cm on the left are not well characterized on the CT. There is no hydronephrosis or nephrolithiasis on either side. The visualized ureters appear unremarkable. The urinary bladder is collapsed and not well visualized. Stomach/Bowel: There is extensive sigmoid diverticulosis with muscular hypertrophy. Mild stranding of the right hemipelvis adjacent to the sigmoid likely related to inflammatory changes of the anterior abdominal wall and less likely represent diverticulitis. Evaluation of this area is very limited due to streak artifact. Correlation with clinical exam is recommended. There is no bowel obstruction. Normal appendix. Vascular/Lymphatic: There is advanced atherosclerotic calcification of the aorta and iliac vessels as well as atherosclerotic calcification of  the mesenteric vasculature. The IVC is grossly unremarkable. No portal venous gas identified. There is no adenopathy. Reproductive: The prostate and seminal vesicles are not well visualized. A Foley catheter is seen with balloon likely at the base of the bladder. Other: A percutaneous tube enters the left anterior abdominal wall. The tube enters the upper peritoneal cavity and appears to penetrate the distal stomach and traverse the anterior wall of the antrum. The tube re- enters the right anterior abdominal wall and takes a caudad coarse in the subcutaneous soft tissues anterior to the right rectus muscle. The tube enters the distal aspect of the right rectus muscle and 3 enters into the peritoneal cavity with coiled tip located in the pelvis. There is a 10 x 5 x 11 cm right rectus sheath hematoma superior to the level of  the umbilicus. Musculoskeletal: There is osteopenia with extensive degenerative changes of the spine. Lower thoracic and lumbosacral posterior fusion hardware noted. There is a total right hip arthroplasty. There is chronic deformity of the right hip with areas of cystic formation surrounding the arthroplasty which may represent loosening or related to particle disease. An infectious process is not excluded. There is remodeling of the medial wall of the iliac bone with protrusion of the hardware. There is extension of the uppermost screw of the acetabular cup through the iliac bone and into the right iliacus muscle. IMPRESSION: 1. Peritoneal dialysis catheter enters the left upper anterior abdominal wall and appears to penetrate into the lumen of the distal stomach. The catheter subsequently exits the wall of the stomach and re-enters the right anterior abdominal wall taking a caudad coarse along the right rectus abdominus muscle. The catheter then traverses the inferior right rectus abdominis and enters the peritoneal cavity within the pelvis with tip coiled in the pelvic floor. 2. Small pockets of extraluminal air noted in the anterior upper abdomen adjacent to the gastric antrum where the catheter enters the stomach. 3. Large right rectus sheath hematoma. 4. Right hip arthroplasty with chronic deformity of the right hip and findings suggestive of arthroplasty loosening or particle disease. There is remodeling of the medial wall of the right acetabulum with protrusion of the arthroplasty. The uppermost screw of the acetabular cup component of the arthroplasty penetrates through the bone into the right iliacus musculature. 5. Other nonemergent findings including extensive diverticulosis, atherosclerosis, and extensive degenerative changes of the spine. These results were called by telephone at the time of interpretation on 11/17/2016 at 9:44 pm to Dr. Oletta Darter, who verbally acknowledged these results. Electronically  Signed   By: Anner Crete M.D.   On: 11/17/2016 22:31   Ct Head Wo Contrast  Result Date: 11/17/2016 CLINICAL DATA:  83 y/o  M; altered mental status. EXAM: CT HEAD WITHOUT CONTRAST TECHNIQUE: Contiguous axial images were obtained from the base of the skull through the vertex without intravenous contrast. COMPARISON:  10/31/2016 CT of the head. FINDINGS: Brain: Interval development of lucency within the right superior putamen extending into mid corona radiata compatible with late acute or subacute infarction. No evidence for large vascular territory acute infarction, acute intracranial hemorrhage, or focal mass effect. No hydrocephalus or extra-axial collection. Stable chronic microvascular ischemic changes and parenchymal volume loss of the brain. Vascular: Extensive calcific atherosclerosis of carotid siphons. Skull: No acute osseous abnormality identified. Sinuses/Orbits: No acute finding. Other: Right temporalis region postsurgical changes are stable. IMPRESSION: 1. Interval small hypodensity within the right superior putamen extending into mid corona radiata, likely late acute or subacute  infarction. 2. No acute hemorrhage or focal mass effect. 3. Stable chronic microvascular ischemic changes and parenchymal volume loss of the brain. These results will be called to the ordering clinician or representative by the Radiologist Assistant, and communication documented in the PACS or zVision Dashboard. Electronically Signed   By: Kristine Garbe M.D.   On: 11/17/2016 20:47   Dg Chest Port 1 View  Result Date: 11/17/2016 CLINICAL DATA:  Hypertension.  CHF. EXAM: PORTABLE CHEST 1 VIEW COMPARISON:  11/15/2016 FINDINGS: 1729 hours. Low lung volumes. The cardio pericardial silhouette is enlarged. The right infrahilar lung opacity persists and is stable. Retrocardiac collapse/ consolidation again noted. Probable tiny bilateral pleural effusions. Left subclavian central line tip overlies the proximal  SVC. Thoracolumbar fusion hardware noted. Telemetry leads overlie the chest. Degenerative changes evident in both shoulders. IMPRESSION: 1. Interval progression of retrocardiac left base collapse/consolidation. 2. Tiny bilateral pleural effusions. 3. Stable hazy infrahilar opacity at the right lung base. Continued follow-up recommended to ensure resolution. Electronically Signed   By: Misty Stanley M.D.   On: 11/17/2016 19:39     STUDIES:  ECHO 10/25 >> LV mildly dilated, systolic function severely reduced, LVEF 25-30%, diffuse hypokinesis, doppler parameters consistent with restrictive physiology, indicative of decreased left ventricular diastolic compliance and/or increased left atrial pressure, mild to mod MR, RV mildly dilated CT abd 10/29 >> rectus sheath hematoma; peritoneal dialysis catheter enters the left upper anterior abdominal wall and impacts the distal stomach, question enters the stomach then exits and reenters the right anterior abdominal wall taking a caudad course along the right rectus abdominis muscle then transverses back into the peritoneal cavity Head Ct 10/29 >> small right superior putamen hypodensity, likely late acute or subacute CVA, no acute bleed  CULTURES: GS Peritoneal Fluid 10/26 >> negative  Peritoneal Fluid Culture 10/26 >>  Aerobic Culture Peritoneal 10/26 >> negative  BCx2 10/24 >> negative   ANTIBIOTICS: Zosyn 10/24 >>  Vanco 10/24 >>   SIGNIFICANT EVENTS: 10/23  Admit with 2 week hx of chest pain, NSTEMI  LINES/TUBES: PD Catheter 10/12 >>   L [place] HD Cath 10/27 >>   DISCUSSION: 83 y/o M admitted on 10/23 with a 2 week history of chest pain.  Status post peritoneal dialysis catheter on 10/31/16, found to have an NSTEMI.  Complicated course with AFwRVR, melena.  Noted to have a rectus sheath hematoma, possible malposition of his peritoneal dialysis catheter impacting the stomach.   ASSESSMENT / PLAN:  PULMONARY A: Tachypnea - suspect multifactorial  in setting of anemia, AFwRVR, CHF, AGMA, kyphosis; improved 10/30 Pulmonary Hypertension - based on ECHO 02/2016 with pressure of 37 Hx Asthma with Mild to Moderate Obstructive Lung Disease - based on PFT 04/2015 P:   Pulmonary hygiene - IS, mobilize Suspect some of his tachypnea relates to metabolic status, anemia, discomfort  CARDIOVASCULAR A:  Chest Pressure / NSTEMI  PAF - on IV heparin + amiodarone Acute on Chronic Systolic & Diastolic CHF  - NEW LV reduction to 25-30%, volume overloaded on admit per documentation Soft / Normal BP - on toprol  Hx Chronic Combined Systolic / Diastolic CHF (04/5407 LVEF 40-45%), Ischemic Heart Disease, HTN, MI (2009), RBBB P:  Heparin, Plavix, aspirin all stopped in setting of rectus sheath hematoma.  Discussed timing of restarting with CCS 10/30.  Would prefer to wait 24 hours, ensure hemoglobin stable before restarting Tele monitoring  Continue amiodarone Metoprolol  RENAL A:   CKD - s/p temp HD cath, volume removal limited  by pressures Recent PD Catheter Placement, question malposition BPH P:   Hemodialysis as per nephrology plans.  Would not use peritoneal dialysis catheter given unclear position Follow BMP, urine output, volume status Replace electrolytes as indicated Avoid nephrotoxic agents, ensure adequate renal perfusion  GASTROINTESTINAL A:   Melena - ? GIB vs hemorrhoids (staff unsure of color) Rectus sheath hematoma due to dialysis catheter placement, heparin, Plavix Possible gastric perforation by malpositioned peritoneal dialysis catheter.  If perforation were present I would expect for clinical signs, abdominal pain, Sirs/sepsis Rule Out Dysphagia - new cough, AMS, ? Aspiration, ? Neuro event with confusion P:   As above I would like to hold his heparin, Plavix, aspirin to ensure at least 24 hours of stability in his hemoglobin before considering restarting Discussed CT scan results and current clinical status with Dr. Johney Maine at  bedside. No peritoneal signs which is reassuring but does not necessarily preclude malposition of the catheter in the stomach.  Most straightforward way to determine would be EGD with gastroenterology to visualize N.p.o. for now.  Family is going to discuss goals for care, risks benefits of watchful waiting on antibiotics versus an invasive procedure to confirm malposition.  They will let us know and we will make plans to involve GI if indicated Agree with n.p.o. until SLP evaluation can be completed PPI given concern for possible melena during this hospitalization  HEMATOLOGIC A:   Anemia - Hgb in early October 2018 12 > now 7. S/p 1 unit PRBC 10/29 with HD  Coagulopathy - on heparin for AF P:  Follow CBC Ensure that hemoglobin fully stabilizes before reconsideration of heparin or antiplatelet medications Follow rectus sheath hematoma for any evidence of enlargement Heparin discontinued Defer Plavix, aspirin for now.  Unclear whether course may take [place] in the direction of a surgical repair Hemoglobin goal in the setting of potential active bleeding should be 8.0, not 10.0  INFECTIOUS A:   Leukocytosis  Acute blood loss anemia At risk peritonitis if there is truly a gastric perforation by the peritoneal dialysis catheter P:   Follow CBC Continue Zosyn as ordered Discontinue vancomycin 10/30  ENDOCRINE A:   Gout  Hyperglycemia - no hx of DM  P:   SSI per primary  Continue allopurinol   NEUROLOGIC A:   Acute Metabolic Encephalopathy Acute CVA P:   ASA and plavix on hold Consider carotid dopplers Frequent re-orientation   FAMILY  - Updates: I updated daughter at bedside 10/30. Confirmed DNR/I status.  Clearly he has multiple issues impacting his overall status.  That being said he does not look to be in any distress.  Would not escalate his care at this time   Independent CC time 40 minutes  Baltazar Apo, MD, PhD 11/18/2016, 9:13 AM [facility] [phone] or if no answer [phone]

## 2016-11-18 NOTE — Progress Notes (Signed)
Patient resting on RA. No distress noted. BIPAP on standby at bedside.

## 2016-11-18 NOTE — Progress Notes (Addendum)
[facility] Progress Note   Assessment/ Plan:   1. Rectus sheath hemtatoma and probable PD catheter malposition: Ct abd with large rectus sheath hematoma with PD cath either invaginating or going through wall of stomach.  Gen surg c/s, could do EGD to assess whether or not PD cath in stomach.  Discussed with dtr.  Deciding on how aggressive to be.  She is leaning towards no further workup and possibly shifting GOC to comfort.  Wants sister and mom here to help make decisions.  C/s pall care.  Could consider empiric antibiotics due to suspicion of perforated viscus.  2.  Anemia: ABLA + anemia of chronic disease.  S/p 1 u pRBCs yesterday.  3. 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 He is very frail and ? If contributing to QOL.  Pall care c/s as above.  4.  Bone/ mineral: on calcitriol  5: DM: per primary  6.  CVA: noted on CT scan 10/29.  R superior putamen extending into corona radiata.  Subjective:    Hgb drop yesterday, CT scan showing large rectus sheath hematoma and ? PD catheter possibly through wall of stomach.  Gen surg c/s last night.  Dtr at bedside this AM.  We discussed the gravity of the situation.   Objective:   BP 100/64 (BP Location: Left Arm)   Pulse 89   Temp 99.1 F (37.3 C) (Oral)   Resp (!) 33   Ht 5\' 4"  (1.626 m)   Wt 77.6 kg (171 lb 1.2 oz)   SpO2 95%   BMI 29.37 kg/m   Physical Exam: GEN: sleeping, frail appearing HEENT: sclerae anicteric NECK: L IJ nontunneled HD cath PULM: slightly tachypneic, muffled breath sounds CV: irregular, + systolic murmur GI: obese, hypoactive bowel sounds, + periumbilical bruising with palpable hematoma.  Moderately tender.  Mildly distended. Extremities: edema 1+  Labs: BMET  Recent Labs Lab 11/13/16 1449 11/14/16 0547 11/15/16 0022 11/15/16 0456 11/15/16 0947 11/16/16 1008 11/17/16 0410 11/18/16 0529  NA  --  141 138 140 137 138 138 136  K  --  4.3 4.3 4.1 3.9 4.2 4.3 3.7  CL  --  101 98* 97* 97* 100*  98* 98*  CO2  --  24 25 26 24 25 24 24   GLUCOSE  --  201* 100* 159* 284* 113* 108* 102*  BUN  --  107* 101* 104* 102* 50* 70* 55*  CREATININE  --  5.29* 5.40* 5.70* 5.77* 3.64* 4.66* 4.20*  CALCIUM  --  9.2 9.0 9.1 8.9 8.3* 8.7* 8.6*  PHOS 5.4*  --   --   --  7.0* 5.6* 6.2* 5.6*   CBC  Recent Labs Lab 11/16/16 1008 11/17/16 0410 11/17/16 2200 11/18/16 0529  WBC 15.9* 15.2* 15.6* 15.5*  NEUTROABS  --   --  12.3*  --   HGB 7.5* 7.1* 8.0* 8.1*  HCT 23.4* 21.7* 24.5* 24.9*  MCV 97.1 97.3 96.1 97.3  PLT 291 307 310 304    @IMGRELPRIORS @ Medications:    . allopurinol  50 mg Oral Daily  . calcitRIOL  0.5 mcg Oral QODAY  . clopidogrel  75 mg Oral Daily  . darbepoetin (ARANESP) injection - DIALYSIS  200 mcg Intravenous Q Mon-HD  . insulin aspart  0-5 Units Subcutaneous QHS  . insulin aspart  0-9 Units Subcutaneous TID WC  . metoprolol succinate  25 mg Oral Daily  . multivitamin  1 tablet Oral QHS  . mupirocin ointment   Nasal BID  . pantoprazole (  PROTONIX) IV  40 mg Intravenous Q12H     Madelon Lips MD [facility] pgr [phone] 11/18/2016, 8:27 AM

## 2016-11-18 NOTE — Care Management Important Message (Signed)
Important Message  Patient Details  Name: [name] MRN: [id number] Date of Birth: Jul 24, 1928   Medicare Important Message Given:  Yes    [name] Abena 11/18/2016, 9:36 AM

## 2016-11-18 NOTE — Consult Note (Signed)
Consultation Note Date: 11/18/2016   Patient Name: [name]  DOB: 10/16/1928  MRN: [id number]  Age / Sex: 83 y.o., male  PCP: Celene Squibb, MD Referring Physician: Velvet Bathe, MD  Reason for Consultation: Establishing goals of care  HPI/Patient Profile: 83 y.o.malewith medical history significant for medically managed coronary artery disease, chronic combined systolic/diastolic CHF, hypertension, and chronic kidney disease stage IV-V, now presenting to the emergency department 11/11/16 for evaluation of chest discomfort and dyspnea and melena noted by daughter Investment banker, corporate). He has had significant decline following PD placement 10/30/16 with increased confusion. Hospitalization complicated by concern for malposition of PD catheter with hematoma, NSTEMI and systolic CHF worsening with EF 25-30% (but concern for bleeding so no anticoagulation), AF RVR, CT head evidence of acute/subacute hypodensity in right superior putamen extending into mid corona radiata (unable to have MRI), and has had HD while hospitalized. Questioned if EGD should be done to verify placement of PD catheter.  Clinical Assessment and Goals of Care: Met at patient's bedside with wife and daughter. Daughter is a Marine scientist and works part time at [employer]. Discussed aggressive vs comfort paths and natural progression of disease. Discussed concern with QOL and best case scenarios. After discussion and weighing risks/benefits of options we have agreed to not proceed with further testing with EGD. Understand that continuing dialysis longer term is not a good option. Hope is to transition amiodarone to po and to have at least one more dialysis treatment to optimize him and then transition to hospice facility [facility]. I will add trazodone 25 mg po tonight for sleep. I will follow up in the morning. All questions/concerns addressed. Emotional support  provided. Therapeutic listening provided.    Primary Decision Maker NEXT OF KIN wife agrees with daughter's decisions     SUMMARY OF RECOMMENDATIONS   - DNR - Transition off amio gtt and to po amio - One last HD treatment - Transition to [place] next couple days  Code Status/Advance Care Planning:  DNR   Symptom Management:   Trazodone 25 mg qhs sleep.   Palliative Prophylaxis:   Aspiration, Bowel Regimen, Delirium Protocol, Frequent Pain Assessment, Oral Care and Turn Reposition  Additional Recommendations (Limitations, Scope, Preferences):  No Artificial Feeding and No Surgical Procedures  Psycho-social/Spiritual:   Desire for further Chaplaincy support:no  Additional Recommendations: Education on Hospice and Grief/Bereavement Support  Prognosis:   < 4 weeks  Discharge Planning: Hospice facility      Primary Diagnoses: Present on Admission: . CKD (chronic kidney disease), stage V ([place]) . Acute on chronic systolic and diastolic heart failure, NYHA class 4 ([place]) . NSTEMI (non-ST elevated myocardial infarction)- type 2 . Leukocytosis . Hyperglycemia   I have reviewed the medical record, interviewed the patient and family, and examined the patient. The following aspects are pertinent.  Past Medical History:  Diagnosis Date  . Acute respiratory failure with hypoxia ([place]) 03/03/2013  . Asthma   . CAD (coronary artery disease)- MI in '09 02/28/2013  . Cancer ([place])  lymphoma  1979    . CHF (congestive heart failure) ([place])    2015  . Chronic renal insufficiency    creatinine ranges around 2.8  . Complication of anesthesia    No anesthesia complications, but concerns about neck positioning due to cervical disease  . DOE (dyspnea on exertion)   . Dyspnea    upon exertion  . Gout   . HCAP (healthcare-associated pneumonia) 03/03/2013  . Herpes zoster 12/2009   left eye ..........last flare up "a long time"  . History of BPH   . HOH (hard of  hearing)    hears well out of left ear  . Hypertension   . IHD (ischemic heart disease)    Remote MI in 2009 with late presentation; no reperfusion. Has large area of infarct in the apical anterior area on last nuclear in 2011. Managed medically  . Influenza A 03/03/2013  . MI, old 03/2007   ACUTE ANTEROSEPTAL  . NSTEMI (non-ST elevated myocardial infarction)- type 2 02/28/2013  . OA (osteoarthritis) of knee    Left knee with revision and replacement of prosthetic L TKR  . Peripheral edema   . RBBB (right bundle branch block)   . Renal insufficiency   . Staph infection    states that gentamycin & vancomycin, (30 - 45 days worth) are what did his kidneys in, per his daughter   Social History   Social History  . Marital status: Married    Spouse name: N/A  . Number of children: N/A  . Years of education: N/A   Occupational History  . Retired [employer]  . Smoking status: Never Smoker  . Smokeless tobacco: Never Used  . Alcohol use No  . Drug use: No  . Sexual activity: Not Asked   Other Topics Concern  . None   Social History Narrative  . None   Family History  Problem Relation Age of Onset  . Coronary artery disease Mother   . Emphysema Father        never smoked, worked as a Medical sales representative Meds: . allopurinol  50 mg Oral Daily  . calcitRIOL  0.5 mcg Oral QODAY  . darbepoetin (ARANESP) injection - DIALYSIS  200 mcg Intravenous Q Mon-HD  . metoprolol succinate  25 mg Oral Daily  . multivitamin  1 tablet Oral QHS  . mupirocin ointment   Nasal BID  . pantoprazole (PROTONIX) IV  40 mg Intravenous Q12H   Continuous Infusions: . amiodarone 30 mg/hr (11/18/16 1214)  . ferric gluconate (FERRLECIT/NULECIT) IV    . nitroGLYCERIN Stopped (11/14/16 0234)  . piperacillin-tazobactam (ZOSYN)  IV Stopped (11/18/16 1039)   PRN Meds:.acetaminophen **OR** acetaminophen, albuterol, morphine injection, nitroGLYCERIN, ondansetron (ZOFRAN) IV, polyvinyl  alcohol, RESOURCE THICKENUP CLEAR, sodium chloride flush, traMADol Allergies  Allergen Reactions  . Gentamycin [Gentamicin] Other (See Comments)    Decreased kidney fx  . Crestor [Rosuvastatin Calcium] Other (See Comments)    MUSCLE PAIN  . Hctz [Hydrochlorothiazide] Other (See Comments)    dizziness  . Lipitor [Atorvastatin Calcium] Other (See Comments)    MUSCLE PAIN   Review of Systems  Constitutional: Positive for activity change and appetite change.  Cardiovascular: Positive for leg swelling.  Neurological: Positive for weakness.  Psychiatric/Behavioral: Positive for confusion.    Physical Exam  Constitutional: He appears well-developed. He has a sickly appearance.  HENT:  Head: Normocephalic and atraumatic.  Cardiovascular: Normal rate.  An irregularly irregular rhythm present.  Pulmonary/Chest: Effort normal. No accessory muscle usage. No tachypnea. No respiratory distress.  Abdominal: Soft. He exhibits distension.  Neurological: He is alert. He is disoriented.  Nursing note and vitals reviewed.   Vital Signs: BP (!) 110/52   Pulse 90   Temp 97.8 F (36.6 C) (Oral)   Resp (!) 33   Ht '5\' 4"'$  (1.626 m)   Wt 77.6 kg (171 lb 1.2 oz)   SpO2 93%   BMI 29.37 kg/m  Pain Assessment: No/denies pain POSS *See Group Information*: S-Acceptable,Sleep, easy to arouse Pain Score: 0-No pain   SpO2: SpO2: 93 % O2 Device:SpO2: 93 % O2 Flow Rate: .O2 Flow Rate (L/min): 3 L/min  IO: Intake/output summary:  Intake/Output Summary (Last 24 hours) at 11/18/16 1658 Last data filed at 11/18/16 1500  Gross per 24 hour  Intake          1624.09 ml  Output              250 ml  Net          1374.09 ml    LBM: Last BM Date: 11/16/16 Baseline Weight: Weight: 80.3 kg (177 lb) Most recent weight: Weight: 77.6 kg (171 lb 1.2 oz)     Palliative Assessment/Data: 30%     Time Total: 70 min  Greater than 50%  of this time was spent counseling and coordinating care related to the  above assessment and plan.  Signed by: Vinie Sill, NP Palliative Medicine Team Pager # [phone] (M-F 8a-5p) Team Phone # [phone] (Nights/Weekends)

## 2016-11-18 NOTE — Progress Notes (Signed)
Modified Barium Swallow Progress Note  Patient Details  Name: [name] MRN: [id number] Date of Birth: Dec 26, 1928  Today's Date: 11/18/2016  Modified Barium Swallow completed.  Full report located under Chart Review in the Imaging Section.  Brief recommendations include the following:  Clinical Impression  Mild oral impairments including prolonged mastication and transit with regular texture, premature spill with nectar due to decreased control. Swallow initiated at the level of the pyriform sinuses due to sensory deficits and motor impairments led to decreased epiglottic deflection and closure resulting in sensed penetration with thin (may have fallen below vocal cords- difficult with shoulders obstructing view). Mild-moderate vallecular residue given reduced epiglottic inversion. Stasis observed in distal portion of esophagus. Pt at risk for aspiration given suspected prior dysphagia with what appears to be cervical osteophyte, deconditioning and recent neck surgery. Recommend Dys 3 texture, nectar thick liquids, pills whole in appleauce, swallow twice and continued ST.   Swallow Evaluation Recommendations       SLP Diet Recommendations: Dysphagia 3 (Mech soft) solids;Nectar thick liquid   Liquid Administration via: Cup;Straw   Medication Administration: Whole meds with puree   Supervision: Patient able to self feed;Full supervision/cueing for compensatory strategies   Compensations: Slow rate;Small sips/bites;Multiple dry swallows after each bite/sip   Postural Changes: Seated upright at 90 degrees   Oral Care Recommendations: Oral care BID        Houston Siren 11/18/2016,4:29 PM  Orbie Pyo Geiger.[employer] [phone]

## 2016-11-18 NOTE — Progress Notes (Addendum)
[facility]  [address] [place], [place] [address] Phone: [phone]  FAX: [phone]      ROOK MAUE [id number] 14-Jan-1929  CARE TEAM:  PCP: Celene Squibb, MD  Outpatient Care Team: Patient Care Team: Celene Squibb, MD as PCP - General (Internal Medicine) Fran Lowes, MD as Consulting Physician (Nephrology) Martinique, Peter M, MD as Consulting Physician (Cardiology) Glenna Fellows, MD as Attending Physician (Neurosurgery) Gaynelle Arabian, MD as Consulting Physician (Orthopedic Surgery)  Inpatient Treatment Team: Treatment Team: Attending Provider: Velvet Bathe, MD; Rounding Team: Sharyon Medicus, MD; Consulting Physician: Lbcardiology, Michae Kava, MD; Consulting Physician: Mauricia Area, MD; Registered Nurse: Gildardo Griffes, RN; Registered Nurse: Darrell Jewel, RN; Technician: Christean Leaf, NT; Rounding Team: Pccm, Md, MD; Consulting Physician: Edison Pace, Md, MD; Physical Therapist: Lacie Draft, PT; Physical Therapist: Judee Clara, Student-PT; Speech Language Pathologist: Valere Dross, [place]; Occupational Therapist: Norman Herrlich, OT; Consulting Physician: Michael Boston, MD   Problem List:   Principal Problem:   NSTEMI (non-ST elevated myocardial infarction)- type 2 Active Problems:   CKD (chronic kidney disease), stage V ([place])   Acute on chronic systolic and diastolic heart failure, NYHA class 4 ([place])   Leukocytosis   Hyperglycemia       OR 10/30/2016  POST-OPERATIVE DIAGNOSIS:  end stage renal disease, dialysis dependent  PROCEDURE:  LAPAROSCOPIC INSERTION CONTINUOUS AMBULATORY PERITONEAL DIALYSIS  (CAPD) CATHETER   OMENTOPEXY  SURGEON:  Adin Hector, MD  Assessment  Rectus sheath hematoma most likely related to being fully anticoagulated and requiring catheter through his rectus muscle.  Suspected is chronic and not acute.  Catheter abutting stomach but without evidence of any  peritonitis or abdominal pain.  I am skeptical that he truly has intragastric breech or erosion  Plan:  -While the catheter does abutt & push into the stomach wall in this patient with a very thin abdominal wall, he does not give a story of upper abdominal pain, abdominal pain after eating, peritonitis.  He has no tenderness or peritonitis in his upper abdomen.  I would expect him to have severe peritonitis and septic shock with massive free air and inflammation if he had a catheter breach into his stomach for 2 weeks.  He has none of that.  He has one tiny dot of pneumoperitoneum that more likely could be explained from catheter flushes and/or leftover gas from surgery.  He does have discomfort at his rectus sheath hematoma, so I feel like he has the capacity to give clinical signs  & feedback on pain and discomfort.  It is reasonable to start with an upper EGD by GI to see if there is presence of the catheter intraluminally.  If he truly does, then he would require laparoscopic possible open operation to remove the catheter and repair the hole in the stomach.  If the endoscopy disproves any evidence of erosion into the stomach, then it is an overread on the CT scan.  I am concerned given his elderly fragile state with possible myocardial infarction & stroke that his operative risks are high.  He was marginal to start, but the family wanted to give surgery CAPD catheter a try.  Patient's daughter is against any interventions or procedures at this time.  We had discussion with the patient, his daughter, nurse advocate, floor nurse, and critical care physician, Dr. Lamonte Sakai.  They will follow this expectantly for now.  I believe the patient's daughter wishes to reach out the rest  the famiy before considering any intervention, perhaps transitioning to comfort care only.    It should they wish to proceed with endoscopy, would like to be available with GI in case there is a breech so I can deal with that quickly.   They are wishing to hold off, so I will be available as needed.  I am skeptical that the rectus sheath is an acute finding.  Most likely it has been a slow bleed given the full anticoagulation of this patient on Plavix at home.  He responded to blood transfusion.  Probably okay to restart full anticoagulation to lower his risk CVA/MI risk.  Sounds like they are going to follow him off anticoagulation for 24 hours improve that he is stable before reconsidering it.  Given his numerous health issues advanced age and deconditioning, they are leaning towards DNR.  I do not think that's unreasonable.  Consider palliative care consult for goals.   45 minutes spent in review, evaluation, examination, counseling, and coordination of care.  More than 50% of that time was spent in counseling.  Adin Hector, M.D., F.A.C.S. Gastrointestinal and Minimally Invasive Surgery [facility] [address] [place], [place] [address] [phone] Main / Paging   11/18/2016    Subjective: (Chief complaint)  Patient daughter, critical-care MD, nursing at bedside with myself.  Patient's daughter notes he's been having dysphagia to solids at home.  Feels like food is sticking when he eats.  Mild sore throat.    No complaint of abdominal pain.  CAPD catheter flushing but not adequate exchanges.  No issues with pain with exchanges as best I can gather.  Mainly had pain on his right side with swelling.  Came in the hospital.  Concern for rectus sheath hematoma.  Possible myocardial infarction.  Anticoagulated.  CT scan done.  CAPD Catheter seen running very close onto vs through the stomach wall.  Surgical consultation requested  Objective:  Vital signs:  Vitals:   11/18/16 0006 11/18/16 0647 11/18/16 0830 11/18/16 0904  BP: (!) 118/53 100/64 125/64 137/66  Pulse: 69 89 90   Resp: (!) 27 (!) 33    Temp: 98.8 F (37.1 C) 99.1 F (37.3 C)  98.2 F (36.8 C)  TempSrc:  Oral Oral  Oral  SpO2: 98% 95%  95%  Weight:  77.6 kg (171 lb 1.2 oz)    Height:        Last BM Date: 11/16/16  Intake/Output   Yesterday:  10/29 0701 - 10/30 0700 In: 1788.8 [I.V.:1323.8; Blood:315; IV Piggyback:150] Out: 2250 [Urine:250] This shift:  No intake/output data recorded.  Bowel function:  Flatus: YES  BM:  YES  Drain: CAPD catheter with clear fluid in tubing   Physical Exam:  General: Pt withdrawn and not particularly interactive.  More so than his severe aseline.  Uncomfortable. Eyes: PERRL, normal EOM.  Sclera clear.  No icterus Neuro: CN II-XII intact w/o focal sensory/motor deficits. Lymph: No head/neck/groin lymphadenopathy Psych:  No psychosis/paranoia.  Again rather stoic with mask face.  Not well oriented. HENT: Normocephalic, Mucus membranes moist.  No thrush Neck: Supple, No tracheal deviation Chest:  No chest wall pain w good excursion CV:  Pulses intact.  Regular rhythm MS: Normal AROM mjr joints.  No obvious deformity  Abdomen: Soft.  Mildy distended.  Swelling and tenderness in right paramedian region consistentwith rectus sheath hematoma.  No active bleeding.  No cellulitis or drainage around the catheter exit site.  Upper  abdomen without any pain or discomfort.  No guarding.  No tenderness..  No evidence of peritonitis.  No incarcerated hernias.  Ext:  No deformity.  No mjr edema.  No cyanosis Skin: No petechiae / purpura.  Again covered in bruises on upper extremities  Results:   Labs: Results for orders placed or performed during the hospital encounter of 11/11/16 (from the past 48 hour(s))  Occult blood card to lab, stool     Status: Abnormal   Collection Time: 11/16/16 12:00 PM  Result Value Ref Range   Fecal Occult Bld POSITIVE (A) NEGATIVE  Glucose, capillary     Status: Abnormal   Collection Time: 11/16/16 12:05 PM  Result Value Ref Range   Glucose-Capillary 116 (H) 65 - 99 mg/dL  Glucose, capillary     Status: Abnormal    Collection Time: 11/16/16  5:02 PM  Result Value Ref Range   Glucose-Capillary 120 (H) 65 - 99 mg/dL  Prepare RBC     Status: None   Collection Time: 11/16/16  9:05 PM  Result Value Ref Range   Order Confirmation ORDER PROCESSED BY BLOOD BANK   Type and screen [place]     Status: None   Collection Time: 11/16/16  9:05 PM  Result Value Ref Range   ABO/RH(D) O POS    Antibody Screen NEG    Sample Expiration 11/19/2016    Unit Number [id number]    Blood Component Type RED CELLS,LR    Unit division 00    Status of Unit ISSUED,FINAL    Transfusion Status OK TO TRANSFUSE    Crossmatch Result Compatible   Glucose, capillary     Status: Abnormal   Collection Time: 11/16/16  9:19 PM  Result Value Ref Range   Glucose-Capillary 114 (H) 65 - 99 mg/dL  Heparin level (unfractionated)     Status: Abnormal   Collection Time: 11/17/16  4:10 AM  Result Value Ref Range   Heparin Unfractionated 0.28 (L) 0.30 - 0.70 IU/mL    Comment:        IF HEPARIN RESULTS ARE BELOW EXPECTED VALUES, AND PATIENT DOSAGE HAS BEEN CONFIRMED, SUGGEST FOLLOW UP TESTING OF ANTITHROMBIN III LEVELS.   CBC     Status: Abnormal   Collection Time: 11/17/16  4:10 AM  Result Value Ref Range   WBC 15.2 (H) 4.0 - 10.5 K/uL   RBC 2.23 (L) 4.22 - 5.81 MIL/uL   Hemoglobin 7.1 (L) 13.0 - 17.0 g/dL   HCT 21.7 (L) 39.0 - 52.0 %   MCV 97.3 78.0 - 100.0 fL   MCH 31.8 26.0 - 34.0 pg   MCHC 32.7 30.0 - 36.0 g/dL   RDW 16.6 (H) 11.5 - 15.5 %   Platelets 307 150 - 400 K/uL  Renal function panel     Status: Abnormal   Collection Time: 11/17/16  4:10 AM  Result Value Ref Range   Sodium 138 135 - 145 mmol/L   Potassium 4.3 3.5 - 5.1 mmol/L   Chloride 98 (L) 101 - 111 mmol/L   CO2 24 22 - 32 mmol/L   Glucose, Bld 108 (H) 65 - 99 mg/dL   BUN 70 (H) 6 - 20 mg/dL   Creatinine, Ser 4.66 (H) 0.61 - 1.24 mg/dL   Calcium 8.7 (L) 8.9 - 10.3 mg/dL   Phosphorus 6.2 (H) 2.5 - 4.6 mg/dL   Albumin 2.3 (L) 3.5 -  5.0 g/dL   GFR calc non Af Amer 10 (L) >60 mL/min  GFR calc Af Amer 12 (L) >60 mL/min    Comment: (NOTE) The eGFR has been calculated using the CKD EPI equation. This calculation has not been validated in all clinical situations. eGFR's persistently <60 mL/min signify possible Chronic Kidney Disease.    Anion gap 16 (H) 5 - 15  Vancomycin, random     Status: None   Collection Time: 11/17/16  8:07 AM  Result Value Ref Range   Vancomycin Rm 15     Comment:        Random Vancomycin therapeutic range is dependent on dosage and time of specimen collection. A peak range is 20.0-40.0 ug/mL A trough range is 5.0-15.0 ug/mL          Glucose, capillary     Status: Abnormal   Collection Time: 11/17/16 11:16 AM  Result Value Ref Range   Glucose-Capillary 107 (H) 65 - 99 mg/dL  Glucose, capillary     Status: Abnormal   Collection Time: 11/17/16  4:48 PM  Result Value Ref Range   Glucose-Capillary 127 (H) 65 - 99 mg/dL  Lactic acid, plasma     Status: None   Collection Time: 11/17/16  6:13 PM  Result Value Ref Range   Lactic Acid, Venous 1.1 0.5 - 1.9 mmol/L  Procalcitonin - Baseline     Status: None   Collection Time: 11/17/16  6:13 PM  Result Value Ref Range   Procalcitonin 1.52 ng/mL    Comment:        Interpretation: PCT > 0.5 ng/mL and <= 2 ng/mL: Systemic infection (sepsis) is possible, but other conditions are known to elevate PCT as well. (NOTE)         ICU PCT Algorithm               Non ICU PCT Algorithm    ----------------------------     ------------------------------         PCT < 0.25 ng/mL                 PCT < 0.1 ng/mL     Stopping of antibiotics            Stopping of antibiotics       strongly encouraged.               strongly encouraged.    ----------------------------     ------------------------------       PCT level decrease by               PCT < 0.25 ng/mL       >= 80% from peak PCT       OR PCT 0.25 - 0.5 ng/mL          Stopping of antibiotics                                              encouraged.     Stopping of antibiotics           encouraged.    ----------------------------     ------------------------------       PCT level decrease by              PCT >= 0.25 ng/mL       < 80% from peak PCT        AND PCT >= 0.5 ng/mL  Continuing antibiotics                                              encouraged.       Continuing antibiotics            encouraged.    ----------------------------     ------------------------------     PCT level increase compared          PCT > 0.5 ng/mL         with peak PCT AND          PCT >= 0.5 ng/mL             Escalation of antibiotics                                          strongly encouraged.      Escalation of antibiotics        strongly encouraged.   Blood gas, arterial     Status: None   Collection Time: 11/17/16  6:15 PM  Result Value Ref Range   O2 Content 2.0 L/min   Delivery systems NASAL CANNULA    pH, Arterial 7.406 7.350 - 7.450   pCO2 arterial 42.1 32.0 - 48.0 mmHg   pO2, Arterial 93.0 83.0 - 108.0 mmHg   Bicarbonate 25.8 20.0 - 28.0 mmol/L   Acid-Base Excess 1.6 0.0 - 2.0 mmol/L   O2 Saturation 97.3 %   Patient temperature 99.2    Collection site RIGHT RADIAL    Drawn by [id number]    Sample type ARTERIAL DRAW    Allens test (pass/fail) PASS PASS  Heparin level (unfractionated)     Status: Abnormal   Collection Time: 11/17/16  6:57 PM  Result Value Ref Range   Heparin Unfractionated >2.20 (H) 0.30 - 0.70 IU/mL    Comment: RESULTS CONFIRMED BY MANUAL DILUTION        IF HEPARIN RESULTS ARE BELOW EXPECTED VALUES, AND PATIENT DOSAGE HAS BEEN CONFIRMED, SUGGEST FOLLOW UP TESTING OF ANTITHROMBIN III LEVELS.   Ammonia     Status: None   Collection Time: 11/17/16  6:57 PM  Result Value Ref Range   Ammonia 26 9 - 35 umol/L  Protime-INR     Status: Abnormal   Collection Time: 11/17/16  6:57 PM  Result Value Ref Range   Prothrombin Time 17.1 (H) 11.4 - 15.2 seconds    INR 1.41   Troponin I     Status: Abnormal   Collection Time: 11/17/16  6:57 PM  Result Value Ref Range   Troponin I 0.73 (HH) <0.03 ng/mL    Comment: CRITICAL RESULT CALLED TO, READ BACK BY AND VERIFIED WITH: C FELTCHER,RN 2040 11/17/2016 WBOND   Lactic acid, plasma     Status: None   Collection Time: 11/17/16  9:10 PM  Result Value Ref Range   Lactic Acid, Venous 0.9 0.5 - 1.9 mmol/L  Heparin level (unfractionated)     Status: Abnormal   Collection Time: 11/17/16  9:20 PM  Result Value Ref Range   Heparin Unfractionated <0.10 (L) 0.30 - 0.70 IU/mL    Comment:        IF HEPARIN RESULTS ARE BELOW EXPECTED VALUES, AND PATIENT DOSAGE HAS BEEN CONFIRMED, SUGGEST FOLLOW UP TESTING OF ANTITHROMBIN  III LEVELS.   Glucose, capillary     Status: Abnormal   Collection Time: 11/17/16  9:31 PM  Result Value Ref Range   Glucose-Capillary 110 (H) 65 - 99 mg/dL  CBC with Differential/Platelet     Status: Abnormal   Collection Time: 11/17/16 10:00 PM  Result Value Ref Range   WBC 15.6 (H) 4.0 - 10.5 K/uL   RBC 2.55 (L) 4.22 - 5.81 MIL/uL   Hemoglobin 8.0 (L) 13.0 - 17.0 g/dL   HCT 24.5 (L) 39.0 - 52.0 %   MCV 96.1 78.0 - 100.0 fL   MCH 31.4 26.0 - 34.0 pg   MCHC 32.7 30.0 - 36.0 g/dL   RDW 16.5 (H) 11.5 - 15.5 %   Platelets 310 150 - 400 K/uL   Neutrophils Relative % 80 %   Neutro Abs 12.3 (H) 1.7 - 7.7 K/uL   Lymphocytes Relative 8 %   Lymphs Abs 1.3 0.7 - 4.0 K/uL   Monocytes Relative 11 %   Monocytes Absolute 1.8 (H) 0.1 - 1.0 K/uL   Eosinophils Relative 1 %   Eosinophils Absolute 0.2 0.0 - 0.7 K/uL   Basophils Relative 0 %   Basophils Absolute 0.1 0.0 - 0.1 K/uL  CBC     Status: Abnormal   Collection Time: 11/18/16  5:29 AM  Result Value Ref Range   WBC 15.5 (H) 4.0 - 10.5 K/uL   RBC 2.56 (L) 4.22 - 5.81 MIL/uL   Hemoglobin 8.1 (L) 13.0 - 17.0 g/dL   HCT 24.9 (L) 39.0 - 52.0 %   MCV 97.3 78.0 - 100.0 fL   MCH 31.6 26.0 - 34.0 pg   MCHC 32.5 30.0 - 36.0 g/dL   RDW 16.4  (H) 11.5 - 15.5 %   Platelets 304 150 - 400 K/uL  Procalcitonin     Status: None   Collection Time: 11/18/16  5:29 AM  Result Value Ref Range   Procalcitonin 1.66 ng/mL    Comment:        Interpretation: PCT > 0.5 ng/mL and <= 2 ng/mL: Systemic infection (sepsis) is possible, but other conditions are known to elevate PCT as well. (NOTE)         ICU PCT Algorithm               Non ICU PCT Algorithm    ----------------------------     ------------------------------         PCT < 0.25 ng/mL                 PCT < 0.1 ng/mL     Stopping of antibiotics            Stopping of antibiotics       strongly encouraged.               strongly encouraged.    ----------------------------     ------------------------------       PCT level decrease by               PCT < 0.25 ng/mL       >= 80% from peak PCT       OR PCT 0.25 - 0.5 ng/mL          Stopping of antibiotics  encouraged.     Stopping of antibiotics           encouraged.    ----------------------------     ------------------------------       PCT level decrease by              PCT >= 0.25 ng/mL       < 80% from peak PCT        AND PCT >= 0.5 ng/mL             Continuing antibiotics                                              encouraged.       Continuing antibiotics            encouraged.    ----------------------------     ------------------------------     PCT level increase compared          PCT > 0.5 ng/mL         with peak PCT AND          PCT >= 0.5 ng/mL             Escalation of antibiotics                                          strongly encouraged.      Escalation of antibiotics        strongly encouraged.   Renal function panel     Status: Abnormal   Collection Time: 11/18/16  5:29 AM  Result Value Ref Range   Sodium 136 135 - 145 mmol/L   Potassium 3.7 3.5 - 5.1 mmol/L   Chloride 98 (L) 101 - 111 mmol/L   CO2 24 22 - 32 mmol/L   Glucose, Bld 102 (H) 65 - 99 mg/dL   BUN  55 (H) 6 - 20 mg/dL   Creatinine, Ser 4.20 (H) 0.61 - 1.24 mg/dL   Calcium 8.6 (L) 8.9 - 10.3 mg/dL   Phosphorus 5.6 (H) 2.5 - 4.6 mg/dL   Albumin 2.1 (L) 3.5 - 5.0 g/dL   GFR calc non Af Amer 12 (L) >60 mL/min   GFR calc Af Amer 14 (L) >60 mL/min    Comment: (NOTE) The eGFR has been calculated using the CKD EPI equation. This calculation has not been validated in all clinical situations. eGFR's persistently <60 mL/min signify possible Chronic Kidney Disease.    Anion gap 14 5 - 15  Glucose, capillary     Status: Abnormal   Collection Time: 11/18/16  7:33 AM  Result Value Ref Range   Glucose-Capillary 107 (H) 65 - 99 mg/dL    Imaging / Studies: Ct Abdomen Pelvis Wo Contrast  Result Date: 11/17/2016 CLINICAL DATA:  83 year old male with abdominal pain and tenderness. Concern for hematoma. EXAM: CT ABDOMEN AND PELVIS WITHOUT CONTRAST TECHNIQUE: Multidetector CT imaging of the abdomen and pelvis was performed following the standard protocol without IV contrast. COMPARISON:  Renal ultrasound dated 04/19/2014 FINDINGS: Evaluation of this exam is limited in the absence of intravenous contrast. Evaluation is also limited due to streak artifact caused by metallic right hip arthroplasty as well as posterior spinal fixation screws. Evaluation is also limited due to motion artifact. Lower chest:  Probable small bilateral pleural effusions with associated partial consolidative changes of the lung bases which may represent compressive atelectasis versus pneumonia. Clinical correlation is recommended. There is mild cardiomegaly. Multi vessel coronary vascular calcification noted. There is hypoattenuation of the cardiac blood pool suggestive of a degree of anemia. Clinical correlation is recommended. There is no intra-abdominal free air. A small pocket of intraperitoneal air in the anterior abdomen (series 3, image 28) noted. Hepatobiliary: The liver and gallbladder appear grossly unremarkable on this  noncontrast CT. No intrahepatic biliary ductal dilatation. Pancreas: The pancreas is atrophic otherwise unremarkable. Spleen: Normal in size without focal abnormality. Adrenals/Urinary Tract: The adrenal glands are grossly unremarkable. Bilateral renal atrophy. Multiple bilateral renal lesions measuring up to 2 cm on the left are not well characterized on the CT. There is no hydronephrosis or nephrolithiasis on either side. The visualized ureters appear unremarkable. The urinary bladder is collapsed and not well visualized. Stomach/Bowel: There is extensive sigmoid diverticulosis with muscular hypertrophy. Mild stranding of the right hemipelvis adjacent to the sigmoid likely related to inflammatory changes of the anterior abdominal wall and less likely represent diverticulitis. Evaluation of this area is very limited due to streak artifact. Correlation with clinical exam is recommended. There is no bowel obstruction. Normal appendix. Vascular/Lymphatic: There is advanced atherosclerotic calcification of the aorta and iliac vessels as well as atherosclerotic calcification of the mesenteric vasculature. The IVC is grossly unremarkable. No portal venous gas identified. There is no adenopathy. Reproductive: The prostate and seminal vesicles are not well visualized. A Foley catheter is seen with balloon likely at the base of the bladder. Other: A percutaneous tube enters the left anterior abdominal wall. The tube enters the upper peritoneal cavity and appears to penetrate the distal stomach and traverse the anterior wall of the antrum. The tube re- enters the right anterior abdominal wall and takes a caudad coarse in the subcutaneous soft tissues anterior to the right rectus muscle. The tube enters the distal aspect of the right rectus muscle and 3 enters into the peritoneal cavity with coiled tip located in the pelvis. There is a 10 x 5 x 11 cm right rectus sheath hematoma superior to the level of the umbilicus.  Musculoskeletal: There is osteopenia with extensive degenerative changes of the spine. Lower thoracic and lumbosacral posterior fusion hardware noted. There is a total right hip arthroplasty. There is chronic deformity of the right hip with areas of cystic formation surrounding the arthroplasty which may represent loosening or related to particle disease. An infectious process is not excluded. There is remodeling of the medial wall of the iliac bone with protrusion of the hardware. There is extension of the uppermost screw of the acetabular cup through the iliac bone and into the right iliacus muscle. IMPRESSION: 1. Peritoneal dialysis catheter enters the left upper anterior abdominal wall and appears to penetrate into the lumen of the distal stomach. The catheter subsequently exits the wall of the stomach and re-enters the right anterior abdominal wall taking a caudad coarse along the right rectus abdominus muscle. The catheter then traverses the inferior right rectus abdominis and enters the peritoneal cavity within the pelvis with tip coiled in the pelvic floor. 2. Small pockets of extraluminal air noted in the anterior upper abdomen adjacent to the gastric antrum where the catheter enters the stomach. 3. Large right rectus sheath hematoma. 4. Right hip arthroplasty with chronic deformity of the right hip and findings suggestive of arthroplasty loosening or particle disease. There is remodeling of the  medial wall of the right acetabulum with protrusion of the arthroplasty. The uppermost screw of the acetabular cup component of the arthroplasty penetrates through the bone into the right iliacus musculature. 5. Other nonemergent findings including extensive diverticulosis, atherosclerosis, and extensive degenerative changes of the spine. These results were called by telephone at the time of interpretation on 11/17/2016 at 9:44 pm to Dr. Oletta Darter, who verbally acknowledged these results. Electronically Signed   By:  Anner Crete M.D.   On: 11/17/2016 22:31   Ct Head Wo Contrast  Result Date: 11/17/2016 CLINICAL DATA:  83 y/o  M; altered mental status. EXAM: CT HEAD WITHOUT CONTRAST TECHNIQUE: Contiguous axial images were obtained from the base of the skull through the vertex without intravenous contrast. COMPARISON:  10/31/2016 CT of the head. FINDINGS: Brain: Interval development of lucency within the right superior putamen extending into mid corona radiata compatible with late acute or subacute infarction. No evidence for large vascular territory acute infarction, acute intracranial hemorrhage, or focal mass effect. No hydrocephalus or extra-axial collection. Stable chronic microvascular ischemic changes and parenchymal volume loss of the brain. Vascular: Extensive calcific atherosclerosis of carotid siphons. Skull: No acute osseous abnormality identified. Sinuses/Orbits: No acute finding. Other: Right temporalis region postsurgical changes are stable. IMPRESSION: 1. Interval small hypodensity within the right superior putamen extending into mid corona radiata, likely late acute or subacute infarction. 2. No acute hemorrhage or focal mass effect. 3. Stable chronic microvascular ischemic changes and parenchymal volume loss of the brain. These results will be called to the ordering clinician or representative by the Radiologist Assistant, and communication documented in the PACS or zVision Dashboard. Electronically Signed   By: Kristine Garbe M.D.   On: 11/17/2016 20:47   Dg Chest Port 1 View  Result Date: 11/17/2016 CLINICAL DATA:  Hypertension.  CHF. EXAM: PORTABLE CHEST 1 VIEW COMPARISON:  11/15/2016 FINDINGS: 1729 hours. Low lung volumes. The cardio pericardial silhouette is enlarged. The right infrahilar lung opacity persists and is stable. Retrocardiac collapse/ consolidation again noted. Probable tiny bilateral pleural effusions. Left subclavian central line tip overlies the proximal SVC.  Thoracolumbar fusion hardware noted. Telemetry leads overlie the chest. Degenerative changes evident in both shoulders. IMPRESSION: 1. Interval progression of retrocardiac left base collapse/consolidation. 2. Tiny bilateral pleural effusions. 3. Stable hazy infrahilar opacity at the right lung base. Continued follow-up recommended to ensure resolution. Electronically Signed   By: Misty Stanley M.D.   On: 11/17/2016 19:39    Medications / Allergies: per chart  Antibiotics: Anti-infectives    Start     Dose/Rate Route Frequency Ordered Stop   11/17/16 1200  vancomycin (VANCOCIN) IVPB 750 mg/150 ml premix     750 mg 150 mL/hr over 60 Minutes Intravenous  Once 11/17/16 1046 11/17/16 1312   11/16/16 1800  piperacillin-tazobactam (ZOSYN) IVPB 3.375 g     3.375 g 12.5 mL/hr over 240 Minutes Intravenous Every 12 hours 11/16/16 1356     11/15/16 2000  piperacillin-tazobactam (ZOSYN) IVPB 2.25 g  Status:  Discontinued     2.25 g 100 mL/hr over 30 Minutes Intravenous Every 12 hours 11/15/16 1903 11/16/16 1356   11/14/16 1800  vancomycin (VANCOCIN) IVPB 1000 mg/200 mL premix     1,000 mg 200 mL/hr over 60 Minutes Intravenous Every 48 hours 11/12/16 1739 11/14/16 1808   11/14/16 1330  piperacillin-tazobactam (ZOSYN) IVPB 2.25 g  Status:  Discontinued     2.25 g 100 mL/hr over 30 Minutes Intravenous Every 12 hours 11/14/16 0950 11/15/16 1903  11/12/16 1800  piperacillin-tazobactam (ZOSYN) IVPB 2.25 g  Status:  Discontinued     2.25 g 100 mL/hr over 30 Minutes Intravenous Every 8 hours 11/12/16 1732 11/14/16 0950   11/12/16 1800  vancomycin (VANCOCIN) 1,500 mg in sodium chloride 0.9 % 500 mL IVPB     1,500 mg 250 mL/hr over 120 Minutes Intravenous  Once 11/12/16 1738 11/13/16 0208        Note: Portions of this report may have been transcribed using voice recognition software. Every effort was made to ensure accuracy; however, inadvertent computerized transcription errors may be present.   Any  transcriptional errors that result from this process are unintentional.     Adin Hector, M.D., F.A.C.S. Gastrointestinal [facility]ally [facility] [address] [place], [place] [address] [phone] Main / Paging   11/18/2016

## 2016-11-18 NOTE — Progress Notes (Signed)
PROGRESS NOTE    [name]  ZOX:[id number] DOB: 1928-06-15 DOA: 11/11/2016 PCP: Celene Squibb, MD    Brief Narrative:  83 y.o. male with medical history significant for medically managed coronary artery disease, chronic combined systolic/diastolic CHF, hypertension, and chronic kidney disease stage IV-V, now presenting to the emergency department for evaluation of chest discomfort and dyspnea.   Patient diagnosed with an STEMI and placed on heparin drip also found to have new onset PAF. Started on IV amiodarone. Hospital stay complicated in that patient has CK D stage V and was doing to start peritoneal dialysis 2 weeks prior to admission. Patient presented with fluid overload type picture which did not respond to Lasix and ultimately required dialysis. Temporary dialysis catheter placed and patient has been dialyzed although they have not been able to take out much fluid during dialysis sessions secondary to low blood pressures. Echocardiogram this admission presented a decrease in EF of 25-30% from prior which showed at that point an EF of 40-45%. Hospital stay further complicated by development of melena and blood loss anemia requiring transfusion during dialysis. As such patient had to be taken off anticoagulation and further evaluation with CT scan of the head was found to have a stroke.    Assessment & Plan:   Principal Problem:   NSTEMI (non-ST elevated myocardial infarction)- type 2 - Cardiology consulted and managing - pt was on heparin gtt but had to be held due to acute blood loss anemia via GI tract, requiring transfusion. - Pt is unstable for cardiac cath and plans right now look like they are moving in the comfort care route given palliative consult.  Per palliative care patient to be optimized medically and transitioned to hospice facility in [place].  New onset PAF - pt on metoprolol and amiodarone  CVA - unclear whether this occurred prior to admission given reports  of altered mental status prior to patient being admitted. Although patient was taken off of anticoagulation secondary to blood loss anemia requiring transfusion secondary to suspected GI losses with melena. - Neurologist consulted.  Active Problems:   CKD (chronic kidney disease), stage V [facility]) - Nephrology consulted and assisting with management patient started PD dialysis sessions and had temporary cath placed for further dialysis. Plan is for 1 more dialysis session then d/c to hospice per palliative care notes.  Anemia/ suspect GI blood loss - Pt has positive fecal occult blood no frank bleeding currently.  - Pt has worsening anemia and required transfusion.  - hgb improved after transfusion.  - anticoagulants held due to worsening anemia    Acute on chronic systolic and diastolic heart failure, NYHA class 4 (HCC) - Fluid status controlled with dialysis - pt on metoprolol    Leukocytosis/suspected pna. - Pt is currently on Zosyn. Concern was that initial chest x-ray reported finding suspicious for pneumonia. Awaiting blood cultures and sputum cultures. Day 7/7    Hyperglycemia - Hgb a1c is 6.0 - continue SSI while in house. On d/c would treat with diet control   DVT prophylaxis: none due to worsening anemia Code Status: DNR Family Communication: spoke with family members daughters Disposition Plan: palliative care addressing. D/c to hospice once patient optimized   Consultants:   Nephrology  Cardiology  Neurology  Palliative   Procedures: none   Antimicrobials: none   Subjective: Pt has limited response to examiner  Objective: Vitals:   11/18/16 0904 11/18/16 1206 11/18/16 1219 11/18/16 1700  BP: 137/66 (!) 123/58 (!) 110/52 Marland Kitchen)  122/57  Pulse:      Resp:      Temp: 98.2 F (36.8 C)  97.8 F (36.6 C) 98.4 F (36.9 C)  TempSrc: Oral  Oral Oral  SpO2: 95% 93%  92%  Weight:      Height:        Intake/Output Summary (Last 24 hours) at 11/18/16  1842 Last data filed at 11/18/16 1500  Gross per 24 hour  Intake          1624.09 ml  Output              250 ml  Net          1374.09 ml   Filed Weights   11/16/16 0332 11/17/16 1018 11/18/16 0647  Weight: 79.2 kg (174 lb 9.7 oz) 78.6 kg (173 lb 4.5 oz) 77.6 kg (171 lb 1.2 oz)    Examination: Exam unchanged when compared to 11/17/2016  General exam: Pt in nad, alert and awake Respiratory system: No wheeze, no rhales, equal chest rise.  Cardiovascular system: S1 & S2 heard, RRR. No murmurs, equal chest rise. Gastrointestinal system: Abdomen is nondistended, soft and nontender. No organomegaly or masses felt. Normal bowel sounds heard. Central nervous system: Alert and oriented. No facial asymmetry. Limited verbal response Extremities: Symmetric 5 x 5 power. Skin: No rashes, lesions or ulcers, on limited exam. Psychiatry: Mood & affect appropriate.    Data Reviewed: I have personally reviewed following labs and imaging studies  CBC:  Recent Labs Lab 11/15/16 0947 11/16/16 1008 11/17/16 0410 11/17/16 2200 11/18/16 0529  WBC 21.5* 15.9* 15.2* 15.6* 15.5*  NEUTROABS  --   --   --  12.3*  --   HGB 8.1* 7.5* 7.1* 8.0* 8.1*  HCT 25.3* 23.4* 21.7* 24.5* 24.9*  MCV 97.3 97.1 97.3 96.1 97.3  PLT 307 291 307 310 151   Basic Metabolic Panel:  Recent Labs Lab 11/13/16 1449  11/15/16 0456 11/15/16 0947 11/16/16 1008 11/17/16 0410 11/18/16 0529  NA  --   < > 140 137 138 138 136  K  --   < > 4.1 3.9 4.2 4.3 3.7  CL  --   < > 97* 97* 100* 98* 98*  CO2  --   < > 26 24 25 24 24   GLUCOSE  --   < > 159* 284* 113* 108* 102*  BUN  --   < > 104* 102* 50* 70* 55*  CREATININE  --   < > 5.70* 5.77* 3.64* 4.66* 4.20*  CALCIUM  --   < > 9.1 8.9 8.3* 8.7* 8.6*  PHOS 5.4*  --   --  7.0* 5.6* 6.2* 5.6*  < > = values in this interval not displayed. GFR: Estimated Creatinine Clearance: 12.1 mL/min (A) (by C-G formula based on SCr of 4.2 mg/dL (H)). Liver Function Tests:  Recent  Labs Lab 11/14/16 1209 11/15/16 0947 11/16/16 1008 11/17/16 0410 11/18/16 0529  AST 20  --   --   --   --   ALT 10*  --   --   --   --   ALKPHOS 90  --   --   --   --   BILITOT 0.9  --   --   --   --   PROT 6.1*  --   --   --   --   ALBUMIN 2.7* 2.4* 2.2* 2.3* 2.1*   No results for input(s): LIPASE, AMYLASE in the last 168 hours.  Recent Labs Lab 11/13/16 0350 11/17/16 1857  AMMONIA 50* 26   Coagulation Profile:  Recent Labs Lab 11/17/16 1857  INR 1.41   Cardiac Enzymes:  Recent Labs Lab 11/12/16 0037 11/12/16 0436 11/12/16 1559 11/17/16 1857  TROPONINI 1.16* 1.74* 1.96* 0.73*   BNP (last 3 results) No results for input(s): PROBNP in the last 8760 hours. HbA1C: No results for input(s): HGBA1C in the last 72 hours. CBG:  Recent Labs Lab 11/17/16 1116 11/17/16 1648 11/17/16 2131 11/18/16 0733 11/18/16 1146  GLUCAP 107* 127* 110* 107* 120*   Lipid Profile: No results for input(s): CHOL, HDL, LDLCALC, TRIG, CHOLHDL, LDLDIRECT in the last 72 hours. Thyroid Function Tests: No results for input(s): TSH, T4TOTAL, FREET4, T3FREE, THYROIDAB in the last 72 hours. Anemia Panel: No results for input(s): VITAMINB12, FOLATE, FERRITIN, TIBC, IRON, RETICCTPCT in the last 72 hours. Sepsis Labs:  Recent Labs Lab 11/12/16 0436 11/13/16 0350 11/17/16 1813 11/17/16 2110 11/18/16 0529  PROCALCITON 0.20 0.25 1.52  --  1.66  LATICACIDVEN  --   --  1.1 0.9  --     Recent Results (from the past 240 hour(s))  Culture, blood (routine x 2)     Status: None   Collection Time: 11/12/16  6:32 PM  Result Value Ref Range Status   Specimen Description BLOOD RIGHT HAND  Final   Special Requests   Final    BOTTLES DRAWN AEROBIC AND ANAEROBIC Blood Culture adequate volume   Culture NO GROWTH 5 DAYS  Final   Report Status 11/17/2016 FINAL  Final  Culture, blood (routine x 2)     Status: None   Collection Time: 11/12/16  6:42 PM  Result Value Ref Range Status   Specimen  Description BLOOD LEFT HAND  Final   Special Requests   Final    BOTTLES DRAWN AEROBIC AND ANAEROBIC Blood Culture adequate volume   Culture NO GROWTH 5 DAYS  Final   Report Status 11/17/2016 FINAL  Final  MRSA PCR Screening     Status: None   Collection Time: 11/13/16 10:26 PM  Result Value Ref Range Status   MRSA by PCR NEGATIVE NEGATIVE Final    Comment:        The GeneXpert MRSA Assay (FDA approved for NASAL specimens only), is one component of a comprehensive MRSA colonization surveillance program. It is not intended to diagnose MRSA infection nor to guide or monitor treatment for MRSA infections.   Stat Gram stain     Status: None   Collection Time: 11/14/16  2:00 AM  Result Value Ref Range Status   Specimen Description FLUID PERITONEAL  Final   Special Requests NONE  Final   Gram Stain   Final    FEW WBC PRESENT, PREDOMINANTLY MONONUCLEAR NO ORGANISMS SEEN    Report Status 11/14/2016 FINAL  Final  Aerobic Culture (superficial specimen)     Status: None   Collection Time: 11/14/16  2:00 AM  Result Value Ref Range Status   Specimen Description FLUID PERITONEAL  Final   Special Requests NONE  Final   Culture NO GROWTH 2 DAYS  Final   Report Status 11/16/2016 FINAL  Final  Culture, body fluid-bottle     Status: None (Preliminary result)   Collection Time: 11/14/16 11:41 AM  Result Value Ref Range Status   Specimen Description PERITONEAL  Final   Special Requests NONE  Final   Culture NO GROWTH 4 DAYS  Final   Report Status PENDING  Incomplete  Gram stain  Status: None   Collection Time: 11/14/16 11:41 AM  Result Value Ref Range Status   Specimen Description PERITONEAL  Final   Special Requests NONE  Final   Gram Stain   Final    RARE WBC PRESENT, PREDOMINANTLY MONONUCLEAR NO ORGANISMS SEEN    Report Status 11/14/2016 FINAL  Final     Radiology Studies: Ct Abdomen Pelvis Wo Contrast  Result Date: 11/17/2016 CLINICAL DATA:  83 year old male with  abdominal pain and tenderness. Concern for hematoma. EXAM: CT ABDOMEN AND PELVIS WITHOUT CONTRAST TECHNIQUE: Multidetector CT imaging of the abdomen and pelvis was performed following the standard protocol without IV contrast. COMPARISON:  Renal ultrasound dated 04/19/2014 FINDINGS: Evaluation of this exam is limited in the absence of intravenous contrast. Evaluation is also limited due to streak artifact caused by metallic right hip arthroplasty as well as posterior spinal fixation screws. Evaluation is also limited due to motion artifact. Lower chest: Probable small bilateral pleural effusions with associated partial consolidative changes of the lung bases which may represent compressive atelectasis versus pneumonia. Clinical correlation is recommended. There is mild cardiomegaly. Multi vessel coronary vascular calcification noted. There is hypoattenuation of the cardiac blood pool suggestive of a degree of anemia. Clinical correlation is recommended. There is no intra-abdominal free air. A small pocket of intraperitoneal air in the anterior abdomen (series 3, image 28) noted. Hepatobiliary: The liver and gallbladder appear grossly unremarkable on this noncontrast CT. No intrahepatic biliary ductal dilatation. Pancreas: The pancreas is atrophic otherwise unremarkable. Spleen: Normal in size without focal abnormality. Adrenals/Urinary Tract: The adrenal glands are grossly unremarkable. Bilateral renal atrophy. Multiple bilateral renal lesions measuring up to 2 cm on the left are not well characterized on the CT. There is no hydronephrosis or nephrolithiasis on either side. The visualized ureters appear unremarkable. The urinary bladder is collapsed and not well visualized. Stomach/Bowel: There is extensive sigmoid diverticulosis with muscular hypertrophy. Mild stranding of the right hemipelvis adjacent to the sigmoid likely related to inflammatory changes of the anterior abdominal wall and less likely represent  diverticulitis. Evaluation of this area is very limited due to streak artifact. Correlation with clinical exam is recommended. There is no bowel obstruction. Normal appendix. Vascular/Lymphatic: There is advanced atherosclerotic calcification of the aorta and iliac vessels as well as atherosclerotic calcification of the mesenteric vasculature. The IVC is grossly unremarkable. No portal venous gas identified. There is no adenopathy. Reproductive: The prostate and seminal vesicles are not well visualized. A Foley catheter is seen with balloon likely at the base of the bladder. Other: A percutaneous tube enters the left anterior abdominal wall. The tube enters the upper peritoneal cavity and appears to penetrate the distal stomach and traverse the anterior wall of the antrum. The tube re- enters the right anterior abdominal wall and takes a caudad coarse in the subcutaneous soft tissues anterior to the right rectus muscle. The tube enters the distal aspect of the right rectus muscle and 3 enters into the peritoneal cavity with coiled tip located in the pelvis. There is a 10 x 5 x 11 cm right rectus sheath hematoma superior to the level of the umbilicus. Musculoskeletal: There is osteopenia with extensive degenerative changes of the spine. Lower thoracic and lumbosacral posterior fusion hardware noted. There is a total right hip arthroplasty. There is chronic deformity of the right hip with areas of cystic formation surrounding the arthroplasty which may represent loosening or related to particle disease. An infectious process is not excluded. There is remodeling of the  medial wall of the iliac bone with protrusion of the hardware. There is extension of the uppermost screw of the acetabular cup through the iliac bone and into the right iliacus muscle. IMPRESSION: 1. Peritoneal dialysis catheter enters the left upper anterior abdominal wall and appears to penetrate into the lumen of the distal stomach. The catheter  subsequently exits the wall of the stomach and re-enters the right anterior abdominal wall taking a caudad coarse along the right rectus abdominus muscle. The catheter then traverses the inferior right rectus abdominis and enters the peritoneal cavity within the pelvis with tip coiled in the pelvic floor. 2. Small pockets of extraluminal air noted in the anterior upper abdomen adjacent to the gastric antrum where the catheter enters the stomach. 3. Large right rectus sheath hematoma. 4. Right hip arthroplasty with chronic deformity of the right hip and findings suggestive of arthroplasty loosening or particle disease. There is remodeling of the medial wall of the right acetabulum with protrusion of the arthroplasty. The uppermost screw of the acetabular cup component of the arthroplasty penetrates through the bone into the right iliacus musculature. 5. Other nonemergent findings including extensive diverticulosis, atherosclerosis, and extensive degenerative changes of the spine. These results were called by telephone at the time of interpretation on 11/17/2016 at 9:44 pm to Dr. Oletta Darter, who verbally acknowledged these results. Electronically Signed   By: Anner Crete M.D.   On: 11/17/2016 22:31   Ct Head Wo Contrast  Result Date: 11/17/2016 CLINICAL DATA:  83 y/o  M; altered mental status. EXAM: CT HEAD WITHOUT CONTRAST TECHNIQUE: Contiguous axial images were obtained from the base of the skull through the vertex without intravenous contrast. COMPARISON:  10/31/2016 CT of the head. FINDINGS: Brain: Interval development of lucency within the right superior putamen extending into mid corona radiata compatible with late acute or subacute infarction. No evidence for large vascular territory acute infarction, acute intracranial hemorrhage, or focal mass effect. No hydrocephalus or extra-axial collection. Stable chronic microvascular ischemic changes and parenchymal volume loss of the brain. Vascular: Extensive  calcific atherosclerosis of carotid siphons. Skull: No acute osseous abnormality identified. Sinuses/Orbits: No acute finding. Other: Right temporalis region postsurgical changes are stable. IMPRESSION: 1. Interval small hypodensity within the right superior putamen extending into mid corona radiata, likely late acute or subacute infarction. 2. No acute hemorrhage or focal mass effect. 3. Stable chronic microvascular ischemic changes and parenchymal volume loss of the brain. These results will be called to the ordering clinician or representative by the Radiologist Assistant, and communication documented in the PACS or zVision Dashboard. Electronically Signed   By: Kristine Garbe M.D.   On: 11/17/2016 20:47   Dg Chest Port 1 View  Result Date: 11/17/2016 CLINICAL DATA:  Hypertension.  CHF. EXAM: PORTABLE CHEST 1 VIEW COMPARISON:  11/15/2016 FINDINGS: 1729 hours. Low lung volumes. The cardio pericardial silhouette is enlarged. The right infrahilar lung opacity persists and is stable. Retrocardiac collapse/ consolidation again noted. Probable tiny bilateral pleural effusions. Left subclavian central line tip overlies the proximal SVC. Thoracolumbar fusion hardware noted. Telemetry leads overlie the chest. Degenerative changes evident in both shoulders. IMPRESSION: 1. Interval progression of retrocardiac left base collapse/consolidation. 2. Tiny bilateral pleural effusions. 3. Stable hazy infrahilar opacity at the right lung base. Continued follow-up recommended to ensure resolution. Electronically Signed   By: Misty Stanley M.D.   On: 11/17/2016 19:39   Dg Swallowing Func-speech Pathology  Result Date: 11/18/2016 Objective Swallowing Evaluation: Type of Study: MBS-Modified Barium Swallow Study Patient  Details Name: [name] MRN: [id number] Date of Birth: [birth year] Today's Date: 11/18/2016 Time: SLP Start Time (ACUTE ONLY): 1321-SLP Stop Time (ACUTE ONLY): 1338 SLP Time Calculation (min)  (ACUTE ONLY): 17 min Past Medical History: Past Medical History: Diagnosis Date . Acute respiratory failure with hypoxia ([place]) 03/03/2013 . Asthma  . CAD (coronary artery disease)- MI in '09 02/28/2013 . Cancer ([place])   lymphoma  1979   . CHF (congestive heart failure) ([place])   2015 . Chronic renal insufficiency   creatinine ranges around 2.8 . Complication of anesthesia   No anesthesia complications, but concerns about neck positioning due to cervical disease . DOE (dyspnea on exertion)  . Dyspnea   upon exertion . Gout  . HCAP (healthcare-associated pneumonia) 03/03/2013 . Herpes zoster 12/2009  left eye ..........last flare up "a long time" . History of BPH  . HOH (hard of hearing)   hears well out of left ear . Hypertension  . IHD (ischemic heart disease)   Remote MI in 2009 with late presentation; no reperfusion. Has large area of infarct in the apical anterior area on last nuclear in 2011. Managed medically . Influenza A 03/03/2013 . MI, old 03/2007  ACUTE ANTEROSEPTAL . NSTEMI (non-ST elevated myocardial infarction)- type 2 02/28/2013 . OA (osteoarthritis) of knee   Left knee with revision and replacement of prosthetic L TKR . Peripheral edema  . RBBB (right bundle branch block)  . Renal insufficiency  . Staph infection   states that gentamycin & vancomycin, (30 - 45 days worth) are what did his kidneys in, per his daughter Past Surgical History: Past Surgical History: Procedure Laterality Date . BACK SURGERY    has had 3 surgeries on his back . BILATERAL CARPAL TUNNEL RELEASE   . BUNIONECTOMY   . CAPD INSERTION N/A 10/30/2016  Procedure: LAPAROSCOPIC INSERTION CONTINUOUS AMBULATORY PERITONEAL DIALYSIS  (CAPD) CATHETER   OMENTOPEXY;  Surgeon: Michael Boston, MD;  Location: [facility];  Service: General;  Laterality: N/A; . CARDIAC CATHETERIZATION  03/24/2007 . CARDIOVASCULAR STRESS TEST  04/02/2009  EF 27% AND A LARGE AREA OF INFARCT IN THE APICAL ANTERIOR WITH MILD PERI-INFARCT ISCHEMIA . FRACTURE SURGERY    both ankles .  INSERTION OF DIALYSIS CATHETER  10/30/2016  LAPAROSCOPIC INSERTION CONTINUOUS AMBULATORY PERITONEAL DIALYSIS  (CAPD) CATHETER   OMENTOPEXY . JOINT REPLACEMENT    3 on right hip, 2 on knee . KNEE SURGERY  1998  left knee . neck surgeries (x2)   . TOE AMPUTATION    LEFT FOOT . TOTAL KNEE ARTHROPLASTY    left knee . TRANSTHORACIC ECHOCARDIOGRAM  04/04/2009  EF 45-50% . TRANSURETHRAL RESECTION OF PROSTATE   HPI: 83 y.o.malewith medical history significant for medically managed coronary artery disease, chronic combined systolic/diastolic CHF, hypertension, and chronic kidney disease stage IV-V, recent neck surgery, who presented to the emergency department for evaluation of chest discomfort and dyspnea. Admitted for NSTEMI - type 2, with possible pneumonia. Initial CXR concerning for PNA, WBC 15.9. Subjective: Pt in bed, family present Assessment / Plan / Recommendation CHL IP CLINICAL IMPRESSIONS 11/18/2016 Clinical Impression Mild oral impairments including prolonged mastication and transit with regular texture, premature spill with nectar due to decreased control. Swallow initiated at the level of the pyriform sinuses due to sensory deficits and motor impairments led to decreased epiglottic deflection and closure resulting in sensed penetration with thin (may have fallen below vocal cords- difficult with shoulders obstructing view). Mild-moderate vallecular residue given reduced epiglottic inversion. Stasis observed in distal portion  of esophagus. Pt at risk for aspiration given suspected prior dysphagia with what appears to be cervical osteophyte, deconditioning and recent neck surgery. Recommend Dys 3 texture, nectar thick liquids, pills whole in appleauce, swallow twice and continued ST. SLP Visit Diagnosis Dysphagia, oropharyngeal phase (R13.12) Attention and concentration deficit following -- Frontal lobe and executive function deficit following -- Impact on safety and function (No Data)   CHL IP TREATMENT  RECOMMENDATION 11/18/2016 Treatment Recommendations Therapy as outlined in treatment plan below   Prognosis 11/18/2016 Prognosis for Safe Diet Advancement (No Data) Barriers to Reach Goals -- Barriers/Prognosis Comment -- CHL IP DIET RECOMMENDATION 11/18/2016 SLP Diet Recommendations Dysphagia 3 (Mech soft) solids;Nectar thick liquid Liquid Administration via Cup;Straw Medication Administration Whole meds with puree Compensations Slow rate;Small sips/bites;Multiple dry swallows after each bite/sip Postural Changes Seated upright at 90 degrees   CHL IP OTHER RECOMMENDATIONS 11/18/2016 Recommended Consults -- Oral Care Recommendations Oral care BID Other Recommendations --   CHL IP FOLLOW UP RECOMMENDATIONS 11/18/2016 Follow up Recommendations (No Data)   CHL IP FREQUENCY AND DURATION 11/18/2016 Speech Therapy Frequency (ACUTE ONLY) min 2x/week Treatment Duration 2 weeks      CHL IP ORAL PHASE 11/18/2016 Oral Phase Impaired Oral - Pudding Teaspoon -- Oral - Pudding Cup -- Oral - Honey Teaspoon -- Oral - Honey Cup -- Oral - Nectar Teaspoon -- Oral - Nectar Cup Delayed oral transit;Premature spillage Oral - Nectar Straw Lingual/palatal residue Oral - Thin Teaspoon -- Oral - Thin Cup WFL Oral - Thin Straw -- Oral - Puree -- Oral - Mech Soft -- Oral - Regular Delayed oral transit Oral - Multi-Consistency -- Oral - Pill -- Oral Phase - Comment --  CHL IP PHARYNGEAL PHASE 11/18/2016 Pharyngeal Phase Impaired Pharyngeal- Pudding Teaspoon -- Pharyngeal -- Pharyngeal- Pudding Cup -- Pharyngeal -- Pharyngeal- Honey Teaspoon -- Pharyngeal -- Pharyngeal- Honey Cup -- Pharyngeal -- Pharyngeal- Nectar Teaspoon -- Pharyngeal -- Pharyngeal- Nectar Cup Penetration/Aspiration during swallow;Pharyngeal residue - valleculae;Reduced epiglottic inversion Pharyngeal Material enters airway, remains ABOVE vocal cords then ejected out Pharyngeal- Nectar Straw Delayed swallow initiation-pyriform sinuses Pharyngeal -- Pharyngeal- Thin Teaspoon  -- Pharyngeal -- Pharyngeal- Thin Cup Penetration/Aspiration during swallow;Reduced epiglottic inversion;Reduced airway/laryngeal closure Pharyngeal Material enters airway, CONTACTS cords and not ejected out Pharyngeal- Thin Straw -- Pharyngeal -- Pharyngeal- Puree -- Pharyngeal -- Pharyngeal- Mechanical Soft -- Pharyngeal -- Pharyngeal- Regular Pharyngeal residue - valleculae;Reduced epiglottic inversion Pharyngeal -- Pharyngeal- Multi-consistency -- Pharyngeal -- Pharyngeal- Pill -- Pharyngeal -- Pharyngeal Comment --  CHL IP CERVICAL ESOPHAGEAL PHASE 11/18/2016 Cervical Esophageal Phase WFL Pudding Teaspoon -- Pudding Cup -- Honey Teaspoon -- Honey Cup -- Nectar Teaspoon -- Nectar Cup -- Nectar Straw -- Thin Teaspoon -- Thin Cup -- Thin Straw -- Puree -- Mechanical Soft -- Regular -- Multi-consistency -- Pill -- Cervical Esophageal Comment -- No flowsheet data found. [place] Siren 11/18/2016, 4:29 PM Orbie Pyo Colvin Caroli.Ed CCC-SLP Pager [phone]              Scheduled Meds: . allopurinol  50 mg Oral Daily  . calcitRIOL  0.5 mcg Oral QODAY  . darbepoetin (ARANESP) injection - DIALYSIS  200 mcg Intravenous Q Mon-HD  . metoprolol succinate  25 mg Oral Daily  . multivitamin  1 tablet Oral QHS  . mupirocin ointment   Nasal BID  . pantoprazole (PROTONIX) IV  40 mg Intravenous Q12H  . traZODone  25 mg Oral QHS   Continuous Infusions: . amiodarone 30 mg/hr (11/18/16 1214)  . ferric gluconate (  FERRLECIT/NULECIT) IV    . nitroGLYCERIN Stopped (11/14/16 0234)  . piperacillin-tazobactam (ZOSYN)  IV 3.375 g (11/18/16 1800)     LOS: 6 days    Time spent: > 35 min  Velvet Bathe, MD [facility] Pager [phone]  If 7PM-7AM, please contact night-coverage [email or web address] Password [email or web address] 11/18/2016, 6:42 PM

## 2016-11-18 NOTE — Consult Note (Signed)
Requesting Physician: Dr. Wendee Beavers    Chief Complaint: Stroke  History obtained from:  Chart    HPI:                                                                                                                                         Much of the history was obtained from the chart as the patient is unable to give a history.  [name] is an 83 y.o. male with a complex medical history admitted 10/23 with dyspnea and A. Fib with RVR.   On 10/11, the patient underwent peritoneal dialysis catheter placement by Dr. Johney Maine.  Family reports after dialysis after placement he was altered/confused.  CT of the head on 10/12 revealed no acute abnormality but chronic microvascular disease.  At baseline he lives at home with his wife who is a retired OB nurse.he does require assistance with bathing and uses a walker. A typical day for him involves him getting up and sitting at the kitchen table to watch TV and read. Family reports since his surgery for PD catheter placement he has not been at his baseline.  Post procedure he was found to be in Afib with RVR. Family indicate he has been somewhat confused, anxious and fidgety. His daughter states she took him to the bathroom on the day prior to presentation on 10/23 and he had a bowel movement that smelled like melena (she is a Equities trader as well).  The patient was initially admitted for evaluation of A. Fib with RVR. He was found to have an NSTEMI with a peak troponin of 1.92 (this was the last assessed troponin). He also had an echocardiogram which demonstrated a reduction in his LVEF to 25-30%.  Prior assessment was 40-45% 2015.  During the hospitalization the patient has had progressive anemia. His preoperative hemoglobin was 12.1.  Currently he is 7.1 and received 1 unit of packed cells during hemodialysis.  Family reports they had a difficult time placing a temporary hemodialysis catheter with 2 attempts.  He has not tolerated peritoneal dialysis thus  far. Lab review shows an anion gap metabolic acidosis (20 corrected for albumin).  He has had tachypnea and concerns for respiratory distress intermittently requiring BiPAP.    Per note last night rapid response was called secondary to decline in mentation.  At that time CT head was obtained and showed a small hypodensity within the right superior putamen extending into the corona radiata--this was deemed likely acute or subacute.  It should be known that a palliative consult has been ordered  Patient denies that at baseline patient has a right-sided ptosis and right facial droop secondary to deviation  Date last known well: Unable to determine Time last known well: Unable to determine tPA Given: No: No last known normal   Past Medical History:  Diagnosis Date  . Acute respiratory failure with hypoxia ([place]) 03/03/2013  .  Asthma   . CAD (coronary artery disease)- MI in '09 02/28/2013  . Cancer ([place])    lymphoma  1979    . CHF (congestive heart failure) ([place])    2015  . Chronic renal insufficiency    creatinine ranges around 2.8  . Complication of anesthesia    No anesthesia complications, but concerns about neck positioning due to cervical disease  . DOE (dyspnea on exertion)   . Dyspnea    upon exertion  . Gout   . HCAP (healthcare-associated pneumonia) 03/03/2013  . Herpes zoster 12/2009   left eye ..........last flare up "a long time"  . History of BPH   . HOH (hard of hearing)    hears well out of left ear  . Hypertension   . IHD (ischemic heart disease)    Remote MI in 2009 with late presentation; no reperfusion. Has large area of infarct in the apical anterior area on last nuclear in 2011. Managed medically  . Influenza A 03/03/2013  . MI, old 03/2007   ACUTE ANTEROSEPTAL  . NSTEMI (non-ST elevated myocardial infarction)- type 2 02/28/2013  . OA (osteoarthritis) of knee    Left knee with revision and replacement of prosthetic L TKR  . Peripheral edema   . RBBB (right bundle  branch block)   . Renal insufficiency   . Staph infection    states that gentamycin & vancomycin, (30 - 45 days worth) are what did his kidneys in, per his daughter    Past Surgical History:  Procedure Laterality Date  . BACK SURGERY     has had 3 surgeries on his back  . BILATERAL CARPAL TUNNEL RELEASE    . BUNIONECTOMY    . CAPD INSERTION N/A 10/30/2016   Procedure: LAPAROSCOPIC INSERTION CONTINUOUS AMBULATORY PERITONEAL DIALYSIS  (CAPD) CATHETER   OMENTOPEXY;  Surgeon: Michael Boston, MD;  Location: [facility];  Service: General;  Laterality: N/A;  . CARDIAC CATHETERIZATION  03/24/2007  . CARDIOVASCULAR STRESS TEST  04/02/2009   EF 27% AND A LARGE AREA OF INFARCT IN THE APICAL ANTERIOR WITH MILD PERI-INFARCT ISCHEMIA  . FRACTURE SURGERY     both ankles  . INSERTION OF DIALYSIS CATHETER  10/30/2016   LAPAROSCOPIC INSERTION CONTINUOUS AMBULATORY PERITONEAL DIALYSIS  (CAPD) CATHETER   OMENTOPEXY  . JOINT REPLACEMENT     3 on right hip, 2 on knee  . KNEE SURGERY  1998   left knee  . neck surgeries (x2)    . TOE AMPUTATION     LEFT FOOT  . TOTAL KNEE ARTHROPLASTY     left knee  . TRANSTHORACIC ECHOCARDIOGRAM  04/04/2009   EF 45-50%  . TRANSURETHRAL RESECTION OF PROSTATE      Family History  Problem Relation Age of Onset  . Coronary artery disease Mother   . Emphysema Father        never smoked, worked as a Psychologist, sport and exercise   Social History:  reports that he has never smoked. He has never used smokeless tobacco. He reports that he does not drink alcohol or use drugs.  Allergies:  Allergies  Allergen Reactions  . Gentamycin [Gentamicin] Other (See Comments)    Decreased kidney fx  . Crestor [Rosuvastatin Calcium] Other (See Comments)    MUSCLE PAIN  . Hctz [Hydrochlorothiazide] Other (See Comments)    dizziness  . Lipitor [Atorvastatin Calcium] Other (See Comments)    MUSCLE PAIN    Medications:  Prior to Admission:  Prescriptions Prior to Admission  Medication Sig Dispense Refill Last Dose  . albuterol (PROVENTIL) (2.5 MG/3ML) 0.083% nebulizer solution INHALE EVERY 4-6HRS AS NEEDED FOR WHEEZING/SHORTNESS OF BREATH  2 11/10/2016 at Unknown time  . allopurinol (ZYLOPRIM) 100 MG tablet Take one half tablet by mouth once daily. ADDITIONAL REFILLS FROM PCP (Patient taking differently: Take 50 mg by mouth daily. ) 30 tablet 1 11/11/2016 at Unknown time  . aspirin 81 MG tablet Take 81 mg by mouth every other day.     11/11/2016 at Unknown time  . clopidogrel (PLAVIX) 75 MG tablet TAKE 1 TABLET BY MOUTH EVERY DAY (Patient taking differently: TAKE 75 MG BY MOUTH EVERY DAY) 90 tablet 1 11/11/2016 at Unknown time  . furosemide (LASIX) 80 MG tablet Take 40 mg by mouth daily.    11/11/2016 at Unknown time  . metoprolol tartrate (LOPRESSOR) 25 MG tablet Take 0.5 tablets (12.5 mg total) by mouth 2 (two) times daily. 30 tablet  11/11/2016 at 1800  . nitroGLYCERIN (NITROSTAT) 0.4 MG SL tablet Place 1 tablet (0.4 mg total) under the tongue every 5 (five) minutes as needed for chest pain (chest pain). 25 tablet 11 unknown  . Propylene Glycol-Glycerin (SOOTHE OP) Place 1 drop into both eyes as needed (for dry eyes).   Past Week at Unknown time  . traMADol (ULTRAM) 50 MG tablet Take 1 tablet (50 mg total) by mouth every 6 (six) hours as needed for moderate pain or severe pain. 20 tablet 0 Past Week at Unknown time  . triamcinolone ointment (KENALOG) 0.1 % Apply 1 application topically daily as needed (for itchy skin).    Past Week at Unknown time   Scheduled: . allopurinol  50 mg Oral Daily  . calcitRIOL  0.5 mcg Oral QODAY  . darbepoetin (ARANESP) injection - DIALYSIS  200 mcg Intravenous Q Mon-HD  . insulin aspart  0-5 Units Subcutaneous QHS  . insulin aspart  0-9 Units Subcutaneous TID WC  . metoprolol succinate  25 mg Oral Daily  . multivitamin  1 tablet Oral QHS  .  mupirocin ointment   Nasal BID  . pantoprazole (PROTONIX) IV  40 mg Intravenous Q12H   Continuous: . amiodarone 30 mg/hr (11/18/16 1214)  . ferric gluconate (FERRLECIT/NULECIT) IV    . nitroGLYCERIN Stopped (11/14/16 0234)  . piperacillin-tazobactam (ZOSYN)  IV Stopped (11/18/16 1039)   URK:[id number] **OR** acetaminophen, albuterol, morphine injection, nitroGLYCERIN, ondansetron (ZOFRAN) IV, polyvinyl alcohol, sodium chloride flush, traMADol  ROS:                                                                                                                                       History obtained from unobtainable from patient due to mental status   Neurologic Examination:  Blood pressure (!) 110/52, pulse 90, temperature 97.8 F (36.6 C), temperature source Oral, resp. rate (!) 33, height 5\' 4"  (1.626 m), weight 77.6 kg (171 lb 1.2 oz), SpO2 93 %.  HEENT-  Normocephalic, no lesions, without obvious abnormality.  Normal external eye and conjunctiva.  Normal TM's bilaterally.  Normal auditory canals and external ears. Normal external nose, mucus membranes and septum.  Normal pharynx. Cardiovascular- S1, S2 normal, pulses palpable throughout   Lungs- chest clear, no wheezing, rales, normal symmetric air entry Abdomen- normal findings: bowel sounds normal Extremities- no edema Lymph-no adenopathy palpable Musculoskeletal-no joint tenderness, deformity or swelling Skin-warm and dry, no hyperpigmentation, vitiligo, or suspicious lesions  Neurological Examination Mental Status: Alert, not oriented--states that he is thirsty,  speech low volume quality but fluent without evidence of aphasia.  Able to follow only simple commands such as squeezing my hand and pulling with his arms. Cranial Nerves: II:  Visual fields grossly normal,  III,IV, VI: ptosis not present, extra-ocular motions intact  bilaterally, pupils equal, round, reactive to light and accommodation V,VII: smile asymmetric on the right side of his face which is old, facial light touch sensation normal bilaterally VIII: hearing normal bilaterally IX,X: uvula rises symmetrically XI: bilateral shoulder shrug XII: midline tongue extension Motor: Right : Upper extremity   4/5    Left:     Upper extremity   5/5  Lower extremity   1/5     Lower extremity   1/5 Tone and bulk:normal tone throughout; no atrophy noted Sensory: Pinprick and light touch intact throughout, bilaterally Deep Tendon Reflexes: Depressed throughout Plantars: Right: downgoing   Left: downgoing Cerebellar: normal finger-to-nose--- however very weak Gait: Not tested       Lab Results: Basic Metabolic Panel:  Recent Labs Lab 11/13/16 1449  11/15/16 0456 11/15/16 0947 11/16/16 1008 11/17/16 0410 11/18/16 0529  NA  --   < > 140 137 138 138 136  K  --   < > 4.1 3.9 4.2 4.3 3.7  CL  --   < > 97* 97* 100* 98* 98*  CO2  --   < > 26 24 25 24 24   GLUCOSE  --   < > 159* 284* 113* 108* 102*  BUN  --   < > 104* 102* 50* 70* 55*  CREATININE  --   < > 5.70* 5.77* 3.64* 4.66* 4.20*  CALCIUM  --   < > 9.1 8.9 8.3* 8.7* 8.6*  PHOS 5.4*  --   --  7.0* 5.6* 6.2* 5.6*  < > = values in this interval not displayed.  Liver Function Tests:  Recent Labs Lab 11/14/16 1209 11/15/16 0947 11/16/16 1008 11/17/16 0410 11/18/16 0529  AST 20  --   --   --   --   ALT 10*  --   --   --   --   ALKPHOS 90  --   --   --   --   BILITOT 0.9  --   --   --   --   PROT 6.1*  --   --   --   --   ALBUMIN 2.7* 2.4* 2.2* 2.3* 2.1*   No results for input(s): LIPASE, AMYLASE in the last 168 hours.  Recent Labs Lab 11/13/16 0350 11/17/16 1857  AMMONIA 50* 26    CBC:  Recent Labs Lab 11/15/16 0947 11/16/16 1008 11/17/16 0410 11/17/16 2200 11/18/16 0529  WBC 21.5* 15.9* 15.2* 15.6* 15.5*  NEUTROABS  --   --   --  12.3*  --   HGB 8.1* 7.5* 7.1* 8.0* 8.1*   HCT 25.3* 23.4* 21.7* 24.5* 24.9*  MCV 97.3 97.1 97.3 96.1 97.3  PLT 307 291 307 310 304    Cardiac Enzymes:  Recent Labs Lab 11/12/16 0037 11/12/16 0436 11/12/16 1559 11/17/16 1857  TROPONINI 1.16* 1.74* 1.96* 0.73*    Lipid Panel: No results for input(s): CHOL, TRIG, HDL, CHOLHDL, VLDL, LDLCALC in the last 168 hours.  CBG:  Recent Labs Lab 11/17/16 1116 11/17/16 1648 11/17/16 2131 11/18/16 0733 11/18/16 1146  GLUCAP 107* 127* 110* 107* 120*    Microbiology: Results for orders placed or performed during the hospital encounter of 11/11/16  Culture, blood (routine x 2)     Status: None   Collection Time: 11/12/16  6:32 PM  Result Value Ref Range Status   Specimen Description BLOOD RIGHT HAND  Final   Special Requests   Final    BOTTLES DRAWN AEROBIC AND ANAEROBIC Blood Culture adequate volume   Culture NO GROWTH 5 DAYS  Final   Report Status 11/17/2016 FINAL  Final  Culture, blood (routine x 2)     Status: None   Collection Time: 11/12/16  6:42 PM  Result Value Ref Range Status   Specimen Description BLOOD LEFT HAND  Final   Special Requests   Final    BOTTLES DRAWN AEROBIC AND ANAEROBIC Blood Culture adequate volume   Culture NO GROWTH 5 DAYS  Final   Report Status 11/17/2016 FINAL  Final  MRSA PCR Screening     Status: None   Collection Time: 11/13/16 10:26 PM  Result Value Ref Range Status   MRSA by PCR NEGATIVE NEGATIVE Final    Comment:        The GeneXpert MRSA Assay (FDA approved for NASAL specimens only), is one component of a comprehensive MRSA colonization surveillance program. It is not intended to diagnose MRSA infection nor to guide or monitor treatment for MRSA infections.   Stat Gram stain     Status: None   Collection Time: 11/14/16  2:00 AM  Result Value Ref Range Status   Specimen Description FLUID PERITONEAL  Final   Special Requests NONE  Final   Gram Stain   Final    FEW WBC PRESENT, PREDOMINANTLY MONONUCLEAR NO ORGANISMS  SEEN    Report Status 11/14/2016 FINAL  Final  Aerobic Culture (superficial specimen)     Status: None   Collection Time: 11/14/16  2:00 AM  Result Value Ref Range Status   Specimen Description FLUID PERITONEAL  Final   Special Requests NONE  Final   Culture NO GROWTH 2 DAYS  Final   Report Status 11/16/2016 FINAL  Final  Culture, body fluid-bottle     Status: None (Preliminary result)   Collection Time: 11/14/16 11:41 AM  Result Value Ref Range Status   Specimen Description PERITONEAL  Final   Special Requests NONE  Final   Culture NO GROWTH 3 DAYS  Final   Report Status PENDING  Incomplete  Gram stain     Status: None   Collection Time: 11/14/16 11:41 AM  Result Value Ref Range Status   Specimen Description PERITONEAL  Final   Special Requests NONE  Final   Gram Stain   Final    RARE WBC PRESENT, PREDOMINANTLY MONONUCLEAR NO ORGANISMS SEEN    Report Status 11/14/2016 FINAL  Final    Coagulation Studies:  Recent Labs  11/17/16 1857  LABPROT 17.1*  INR 1.41    Imaging:  Ct Abdomen Pelvis Wo Contrast  Result Date: 11/17/2016 CLINICAL DATA:  83 year old male with abdominal pain and tenderness. Concern for hematoma. EXAM: CT ABDOMEN AND PELVIS WITHOUT CONTRAST TECHNIQUE: Multidetector CT imaging of the abdomen and pelvis was performed following the standard protocol without IV contrast. COMPARISON:  Renal ultrasound dated 04/19/2014 FINDINGS: Evaluation of this exam is limited in the absence of intravenous contrast. Evaluation is also limited due to streak artifact caused by metallic right hip arthroplasty as well as posterior spinal fixation screws. Evaluation is also limited due to motion artifact. Lower chest: Probable small bilateral pleural effusions with associated partial consolidative changes of the lung bases which may represent compressive atelectasis versus pneumonia. Clinical correlation is recommended. There is mild cardiomegaly. Multi vessel coronary vascular  calcification noted. There is hypoattenuation of the cardiac blood pool suggestive of a degree of anemia. Clinical correlation is recommended. There is no intra-abdominal free air. A small pocket of intraperitoneal air in the anterior abdomen (series 3, image 28) noted. Hepatobiliary: The liver and gallbladder appear grossly unremarkable on this noncontrast CT. No intrahepatic biliary ductal dilatation. Pancreas: The pancreas is atrophic otherwise unremarkable. Spleen: Normal in size without focal abnormality. Adrenals/Urinary Tract: The adrenal glands are grossly unremarkable. Bilateral renal atrophy. Multiple bilateral renal lesions measuring up to 2 cm on the left are not well characterized on the CT. There is no hydronephrosis or nephrolithiasis on either side. The visualized ureters appear unremarkable. The urinary bladder is collapsed and not well visualized. Stomach/Bowel: There is extensive sigmoid diverticulosis with muscular hypertrophy. Mild stranding of the right hemipelvis adjacent to the sigmoid likely related to inflammatory changes of the anterior abdominal wall and less likely represent diverticulitis. Evaluation of this area is very limited due to streak artifact. Correlation with clinical exam is recommended. There is no bowel obstruction. Normal appendix. Vascular/Lymphatic: There is advanced atherosclerotic calcification of the aorta and iliac vessels as well as atherosclerotic calcification of the mesenteric vasculature. The IVC is grossly unremarkable. No portal venous gas identified. There is no adenopathy. Reproductive: The prostate and seminal vesicles are not well visualized. A Foley catheter is seen with balloon likely at the base of the bladder. Other: A percutaneous tube enters the left anterior abdominal wall. The tube enters the upper peritoneal cavity and appears to penetrate the distal stomach and traverse the anterior wall of the antrum. The tube re- enters the right anterior  abdominal wall and takes a caudad coarse in the subcutaneous soft tissues anterior to the right rectus muscle. The tube enters the distal aspect of the right rectus muscle and 3 enters into the peritoneal cavity with coiled tip located in the pelvis. There is a 10 x 5 x 11 cm right rectus sheath hematoma superior to the level of the umbilicus. Musculoskeletal: There is osteopenia with extensive degenerative changes of the spine. Lower thoracic and lumbosacral posterior fusion hardware noted. There is a total right hip arthroplasty. There is chronic deformity of the right hip with areas of cystic formation surrounding the arthroplasty which may represent loosening or related to particle disease. An infectious process is not excluded. There is remodeling of the medial wall of the iliac bone with protrusion of the hardware. There is extension of the uppermost screw of the acetabular cup through the iliac bone and into the right iliacus muscle. IMPRESSION: 1. Peritoneal dialysis catheter enters the left upper anterior abdominal wall and appears to penetrate into the lumen of the distal stomach. The catheter subsequently exits the wall of  the stomach and re-enters the right anterior abdominal wall taking a caudad coarse along the right rectus abdominus muscle. The catheter then traverses the inferior right rectus abdominis and enters the peritoneal cavity within the pelvis with tip coiled in the pelvic floor. 2. Small pockets of extraluminal air noted in the anterior upper abdomen adjacent to the gastric antrum where the catheter enters the stomach. 3. Large right rectus sheath hematoma. 4. Right hip arthroplasty with chronic deformity of the right hip and findings suggestive of arthroplasty loosening or particle disease. There is remodeling of the medial wall of the right acetabulum with protrusion of the arthroplasty. The uppermost screw of the acetabular cup component of the arthroplasty penetrates through the bone  into the right iliacus musculature. 5. Other nonemergent findings including extensive diverticulosis, atherosclerosis, and extensive degenerative changes of the spine. These results were called by telephone at the time of interpretation on 11/17/2016 at 9:44 pm to Dr. Oletta Darter, who verbally acknowledged these results. Electronically Signed   By: Anner Crete M.D.   On: 11/17/2016 22:31   Ct Head Wo Contrast  Result Date: 11/17/2016 CLINICAL DATA:  83 y/o  M; altered mental status. EXAM: CT HEAD WITHOUT CONTRAST TECHNIQUE: Contiguous axial images were obtained from the base of the skull through the vertex without intravenous contrast. COMPARISON:  10/31/2016 CT of the head. FINDINGS: Brain: Interval development of lucency within the right superior putamen extending into mid corona radiata compatible with late acute or subacute infarction. No evidence for large vascular territory acute infarction, acute intracranial hemorrhage, or focal mass effect. No hydrocephalus or extra-axial collection. Stable chronic microvascular ischemic changes and parenchymal volume loss of the brain. Vascular: Extensive calcific atherosclerosis of carotid siphons. Skull: No acute osseous abnormality identified. Sinuses/Orbits: No acute finding. Other: Right temporalis region postsurgical changes are stable. IMPRESSION: 1. Interval small hypodensity within the right superior putamen extending into mid corona radiata, likely late acute or subacute infarction. 2. No acute hemorrhage or focal mass effect. 3. Stable chronic microvascular ischemic changes and parenchymal volume loss of the brain. These results will be called to the ordering clinician or representative by the Radiologist Assistant, and communication documented in the PACS or zVision Dashboard. Electronically Signed   By: Kristine Garbe M.D.   On: 11/17/2016 20:47   Dg Chest Port 1 View  Result Date: 11/17/2016 CLINICAL DATA:  Hypertension.  CHF. EXAM:  PORTABLE CHEST 1 VIEW COMPARISON:  11/15/2016 FINDINGS: 1729 hours. Low lung volumes. The cardio pericardial silhouette is enlarged. The right infrahilar lung opacity persists and is stable. Retrocardiac collapse/ consolidation again noted. Probable tiny bilateral pleural effusions. Left subclavian central line tip overlies the proximal SVC. Thoracolumbar fusion hardware noted. Telemetry leads overlie the chest. Degenerative changes evident in both shoulders. IMPRESSION: 1. Interval progression of retrocardiac left base collapse/consolidation. 2. Tiny bilateral pleural effusions. 3. Stable hazy infrahilar opacity at the right lung base. Continued follow-up recommended to ensure resolution. Electronically Signed   By: Misty Stanley M.D.   On: 11/17/2016 19:39       Assessment and plan discussed with with attending physician and they are in agreement.    Etta Quill PA-C [facility] [phone]  11/18/2016, 12:37 PM    Attending addendum Patient seen and examined Spoke in detail with patient's daughter.  Assessment: 83 y.o. male who presented to [facility] secondary to chest pain and shortness of breath.  While in the ED patient was found to be in atrial fibrillation.  Patient has significant  kidney disease stage IV-V and has made it clear to his family that he has not wanted to hemodialysis.  Currently patient is under peritoneal dialysis however there was a problem with the peritoneal catheter impacting the stomach.  Due to altered mental status patient had a CT scan while in the hospital.  This showed a acute/subacute right putamen extending to the corona radiata.  At this point patient's altered mental status is highly unlikely secondary to the stroke and more likely secondary to the multiple toxic metabolic issues that are underlying his illness.  Due to multiple complications and inability to go further with many surgical procedures palliative care has been  consulted  Stroke Risk Factors - diabetes mellitus and hypertension atrial fibrillation with a rapid ventricular response   Recommend 1 palliative care consult 2.  Unfortunately patient is unable to have MRI secondary to having a plate placed back in 1979 3. PT consult, OT consult, Speech consult 4. Echocardiogram--showed EF of 25-30% diffuse hypokinesis with akinesis of the anterior wall and aspect.  Overall severely reduced left ventricular function. 5. 80 mg of Atorvistatin 6. Prophylactic therapy-Antiplatelet med: as per cardiology when satable 7. Risk factor modification 8. Telemetry monitoring 9. Frequent neuro checks 10 NPO until passes stroke swallow screen  Based on discussion with the daughter and palliative care team, it seems like patient's care is going more towards comfort measures only. Please call neurology with questions.  We will be available as needed.  Amie Portland, MD [facility] [phone] If 7pm to 7am, please call on call as listed on AMION.

## 2016-11-18 NOTE — Progress Notes (Signed)
Called by charge RN to help monitor and transport patient to CT.  Patient has had a steady decline per nursing, per documentation, and per family. CCM was consulted, patient was made a DNR earlier, CT HEAD and CT ABD were ordered. + LGIB, drop in the HB, received one unit of blood during HD this morning.   Patient has been on and off BIPAP, RNs felt more comfortable with RR RN during transport and while in scanner since patient has to be flat for scans. RR was in the lowe 30s, otherwise VS were stable.    I was able to take the patient to CT, scans were done, patient brought back to the room, I did speak with this daughter, she was very pleasant and was able to help me gain a baseline on this patient.  Patient was sleepy, slow to respond, confused, but did follow commands.   RR Interventions: -- Transport to CT -- I asked the primary to call [facility] MD and inform MD when scans resulted and have MD talk to the family with the results as well.  End Time 2045

## 2016-11-18 NOTE — Progress Notes (Signed)
Progress Note  Patient Name: [name] Date of Encounter: 11/18/2016  Primary Cardiologist: Dr. Martinique  Subjective   Pt opens eyes and grunts. He denies chest pain or shortness of breath. He does not answer any more complex questions. His family says that he was anxious last night with the oxygen cannula on and he calmed down once it was finally removed. He has slight left sided facial droop and left eyelid droop.   Inpatient Medications    Scheduled Meds: . allopurinol  50 mg Oral Daily  . calcitRIOL  0.5 mcg Oral QODAY  . darbepoetin (ARANESP) injection - DIALYSIS  200 mcg Intravenous Q Mon-HD  . insulin aspart  0-5 Units Subcutaneous QHS  . insulin aspart  0-9 Units Subcutaneous TID WC  . metoprolol succinate  25 mg Oral Daily  . multivitamin  1 tablet Oral QHS  . mupirocin ointment   Nasal BID  . pantoprazole (PROTONIX) IV  40 mg Intravenous Q12H   Continuous Infusions: . amiodarone 30 mg/hr (11/17/16 2303)  . ferric gluconate (FERRLECIT/NULECIT) IV    . nitroGLYCERIN Stopped (11/14/16 0234)  . piperacillin-tazobactam (ZOSYN)  IV 3.375 g (11/18/16 0547)   PRN Meds: acetaminophen **OR** acetaminophen, albuterol, morphine injection, nitroGLYCERIN, ondansetron (ZOFRAN) IV, polyvinyl alcohol, sodium chloride flush, traMADol   Vital Signs    Vitals:   11/18/16 0006 11/18/16 0647 11/18/16 0830 11/18/16 0904  BP: (!) 118/53 100/64 125/64 137/66  Pulse: 69 89 90   Resp: (!) 27 (!) 33    Temp: 98.8 F (37.1 C) 99.1 F (37.3 C)  98.2 F (36.8 C)  TempSrc: Oral Oral  Oral  SpO2: 98% 95%  95%  Weight:  171 lb 1.2 oz (77.6 kg)    Height:        Intake/Output Summary (Last 24 hours) at 11/18/16 0938 Last data filed at 11/18/16 0600  Gross per 24 hour  Intake          1788.79 ml  Output             2250 ml  Net          -461.21 ml   Filed Weights   11/16/16 0332 11/17/16 1018 11/18/16 0647  Weight: 174 lb 9.7 oz (79.2 kg) 173 lb 4.5 oz (78.6 kg) 171 lb 1.2 oz  (77.6 kg)    Telemetry    sinus rhythm int he 70's with PAC's. Had several hours of rate controlled afib during the night.   - Personally Reviewed  ECG    No new tracings - Personally Reviewed  Physical Exam   GEN: Ill appearing male, in no acute distress.   Neck: No JVD Cardiac: RRR, no murmurs, rubs, or gallops.  Respiratory: Crackles in lung bases GI: Soft, non-distended  MS: Trace edema; No deformity. Neuro:  Opens eyes, makes eye contact, only answers yes/no to simple questions. Unable to state place. Psych: Unable to assess, currently calm   Labs    Chemistry Recent Labs Lab 11/14/16 1209  11/16/16 1008 11/17/16 0410 11/18/16 0529  NA  --   < > 138 138 136  K  --   < > 4.2 4.3 3.7  CL  --   < > 100* 98* 98*  CO2  --   < > 25 24 24   GLUCOSE  --   < > 113* 108* 102*  BUN  --   < > 50* 70* 55*  CREATININE  --   < > 3.64* 4.66*  4.20*  CALCIUM  --   < > 8.3* 8.7* 8.6*  PROT 6.1*  --   --   --   --   ALBUMIN 2.7*  < > 2.2* 2.3* 2.1*  AST 20  --   --   --   --   ALT 10*  --   --   --   --   ALKPHOS 90  --   --   --   --   BILITOT 0.9  --   --   --   --   GFRNONAA  --   < > 14* 10* 12*  GFRAA  --   < > 16* 12* 14*  ANIONGAP  --   < > 13 16* 14  < > = values in this interval not displayed.   Hematology Recent Labs Lab 11/17/16 0410 11/17/16 2200 11/18/16 0529  WBC 15.2* 15.6* 15.5*  RBC 2.23* 2.55* 2.56*  HGB 7.1* 8.0* 8.1*  HCT 21.7* 24.5* 24.9*  MCV 97.3 96.1 97.3  MCH 31.8 31.4 31.6  MCHC 32.7 32.7 32.5  RDW 16.6* 16.5* 16.4*  PLT 307 310 304    Cardiac Enzymes Recent Labs Lab 11/12/16 0037 11/12/16 0436 11/12/16 1559 11/17/16 1857  TROPONINI 1.16* 1.74* 1.96* 0.73*    Recent Labs Lab 11/11/16 2233  TROPIPOC 0.62*     BNP Recent Labs Lab 11/11/16 2239  BNP 2,178.5*     DDimer No results for input(s): DDIMER in the last 168 hours.   Radiology    Ct Abdomen Pelvis Wo Contrast  Result Date: 11/17/2016 CLINICAL DATA:   83 year old male with abdominal pain and tenderness. Concern for hematoma. EXAM: CT ABDOMEN AND PELVIS WITHOUT CONTRAST TECHNIQUE: Multidetector CT imaging of the abdomen and pelvis was performed following the standard protocol without IV contrast. COMPARISON:  Renal ultrasound dated 04/19/2014 FINDINGS: Evaluation of this exam is limited in the absence of intravenous contrast. Evaluation is also limited due to streak artifact caused by metallic right hip arthroplasty as well as posterior spinal fixation screws. Evaluation is also limited due to motion artifact. Lower chest: Probable small bilateral pleural effusions with associated partial consolidative changes of the lung bases which may represent compressive atelectasis versus pneumonia. Clinical correlation is recommended. There is mild cardiomegaly. Multi vessel coronary vascular calcification noted. There is hypoattenuation of the cardiac blood pool suggestive of a degree of anemia. Clinical correlation is recommended. There is no intra-abdominal free air. A small pocket of intraperitoneal air in the anterior abdomen (series 3, image 28) noted. Hepatobiliary: The liver and gallbladder appear grossly unremarkable on this noncontrast CT. No intrahepatic biliary ductal dilatation. Pancreas: The pancreas is atrophic otherwise unremarkable. Spleen: Normal in size without focal abnormality. Adrenals/Urinary Tract: The adrenal glands are grossly unremarkable. Bilateral renal atrophy. Multiple bilateral renal lesions measuring up to 2 cm on the left are not well characterized on the CT. There is no hydronephrosis or nephrolithiasis on either side. The visualized ureters appear unremarkable. The urinary bladder is collapsed and not well visualized. Stomach/Bowel: There is extensive sigmoid diverticulosis with muscular hypertrophy. Mild stranding of the right hemipelvis adjacent to the sigmoid likely related to inflammatory changes of the anterior abdominal wall and  less likely represent diverticulitis. Evaluation of this area is very limited due to streak artifact. Correlation with clinical exam is recommended. There is no bowel obstruction. Normal appendix. Vascular/Lymphatic: There is advanced atherosclerotic calcification of the aorta and iliac vessels as well as atherosclerotic calcification of the mesenteric vasculature. The  IVC is grossly unremarkable. No portal venous gas identified. There is no adenopathy. Reproductive: The prostate and seminal vesicles are not well visualized. A Foley catheter is seen with balloon likely at the base of the bladder. Other: A percutaneous tube enters the left anterior abdominal wall. The tube enters the upper peritoneal cavity and appears to penetrate the distal stomach and traverse the anterior wall of the antrum. The tube re- enters the right anterior abdominal wall and takes a caudad coarse in the subcutaneous soft tissues anterior to the right rectus muscle. The tube enters the distal aspect of the right rectus muscle and 3 enters into the peritoneal cavity with coiled tip located in the pelvis. There is a 10 x 5 x 11 cm right rectus sheath hematoma superior to the level of the umbilicus. Musculoskeletal: There is osteopenia with extensive degenerative changes of the spine. Lower thoracic and lumbosacral posterior fusion hardware noted. There is a total right hip arthroplasty. There is chronic deformity of the right hip with areas of cystic formation surrounding the arthroplasty which may represent loosening or related to particle disease. An infectious process is not excluded. There is remodeling of the medial wall of the iliac bone with protrusion of the hardware. There is extension of the uppermost screw of the acetabular cup through the iliac bone and into the right iliacus muscle. IMPRESSION: 1. Peritoneal dialysis catheter enters the left upper anterior abdominal wall and appears to penetrate into the lumen of the distal  stomach. The catheter subsequently exits the wall of the stomach and re-enters the right anterior abdominal wall taking a caudad coarse along the right rectus abdominus muscle. The catheter then traverses the inferior right rectus abdominis and enters the peritoneal cavity within the pelvis with tip coiled in the pelvic floor. 2. Small pockets of extraluminal air noted in the anterior upper abdomen adjacent to the gastric antrum where the catheter enters the stomach. 3. Large right rectus sheath hematoma. 4. Right hip arthroplasty with chronic deformity of the right hip and findings suggestive of arthroplasty loosening or particle disease. There is remodeling of the medial wall of the right acetabulum with protrusion of the arthroplasty. The uppermost screw of the acetabular cup component of the arthroplasty penetrates through the bone into the right iliacus musculature. 5. Other nonemergent findings including extensive diverticulosis, atherosclerosis, and extensive degenerative changes of the spine. These results were called by telephone at the time of interpretation on 11/17/2016 at 9:44 pm to Dr. Oletta Darter, who verbally acknowledged these results. Electronically Signed   By: Anner Crete M.D.   On: 11/17/2016 22:31   Ct Head Wo Contrast  Result Date: 11/17/2016 CLINICAL DATA:  83 y/o  M; altered mental status. EXAM: CT HEAD WITHOUT CONTRAST TECHNIQUE: Contiguous axial images were obtained from the base of the skull through the vertex without intravenous contrast. COMPARISON:  10/31/2016 CT of the head. FINDINGS: Brain: Interval development of lucency within the right superior putamen extending into mid corona radiata compatible with late acute or subacute infarction. No evidence for large vascular territory acute infarction, acute intracranial hemorrhage, or focal mass effect. No hydrocephalus or extra-axial collection. Stable chronic microvascular ischemic changes and parenchymal volume loss of the brain.  Vascular: Extensive calcific atherosclerosis of carotid siphons. Skull: No acute osseous abnormality identified. Sinuses/Orbits: No acute finding. Other: Right temporalis region postsurgical changes are stable. IMPRESSION: 1. Interval small hypodensity within the right superior putamen extending into mid corona radiata, likely late acute or subacute infarction. 2. No acute  hemorrhage or focal mass effect. 3. Stable chronic microvascular ischemic changes and parenchymal volume loss of the brain. These results will be called to the ordering clinician or representative by the Radiologist Assistant, and communication documented in the PACS or zVision Dashboard. Electronically Signed   By: Kristine Garbe M.D.   On: 11/17/2016 20:47   Dg Chest Port 1 View  Result Date: 11/17/2016 CLINICAL DATA:  Hypertension.  CHF. EXAM: PORTABLE CHEST 1 VIEW COMPARISON:  11/15/2016 FINDINGS: 1729 hours. Low lung volumes. The cardio pericardial silhouette is enlarged. The right infrahilar lung opacity persists and is stable. Retrocardiac collapse/ consolidation again noted. Probable tiny bilateral pleural effusions. Left subclavian central line tip overlies the proximal SVC. Thoracolumbar fusion hardware noted. Telemetry leads overlie the chest. Degenerative changes evident in both shoulders. IMPRESSION: 1. Interval progression of retrocardiac left base collapse/consolidation. 2. Tiny bilateral pleural effusions. 3. Stable hazy infrahilar opacity at the right lung base. Continued follow-up recommended to ensure resolution. Electronically Signed   By: Misty Stanley M.D.   On: 11/17/2016 19:39    Cardiac Studies   Echocardiogram 11/13/2016 Study Conclusions - Left ventricle: The cavity size was mildly dilated. Wall thickness was normal. Systolic function was severely reduced. The estimated ejection fraction was in the range of 25% to 30%. Diffuse hypokinesis. There is akinesis of the anterior and  apical myocardium. Doppler parameters are consistent with restrictive physiology, indicative of decreased left ventricular diastolic compliance and/or increased left atrial pressure. Doppler parameters are consistent with high ventricular filling pressure. - Mitral valve: Calcified annulus. There was mild to moderate regurgitation. - Right ventricle: The cavity size was mildly dilated.  Impressions: - Definity used; Diffuse hypokinesis with akinesis of the anterior wall and apex; overall severely reduced LV systolic function; restrictive filling; mild LVE; mild to moderate MR; mild RVE.  Patient Profile     83 y.o. male  with CAD s/p medically managed STEMI, chronic combined systolic and diastolic CHF, HTN and CKD V here with acute on chronic systolic and diastolic heart failure and possible HCAP.  Assessment & Plan    1. Acute on chronic systolic and diastolic heart failure: LVEF reduced to 25-30% with akinesis of the anterior and apical myocardium, down from 40-45% in 02/2013. Volume overloaded on admission. Hemodialysis has been started, got treatment yesterday. Volume removal has been limited by low BP's. Lasix was not effective. Wt is coming down 180-->171 (down 3 pounds from yest). Breathing is much better. Ischemic workup has been deferred due to altered mental status. It appears that the pt has gradually declined over the past week or so. May have had recent stroke. Currently managing with conservative approach.   2. NSTEMI: Troponins elevated with max 1.96 and then down to 0.73. Troponin elevations possibly related to CKD, heart failure and atrial fib with RVR.  Ischemic workup has been deferred due to altered mental status. It appears that the pt has gradually declined over the past week or so. Currently managing with conservative approach.   3. Paroxysmal afib: On IV amiodarone. Currently in sinus rhythm with PAC's. Had several hours of rate controlled afib during the  night. Pt is NPO for possible stroke. Plan to switch to oral amiodarone once more awake/alert, taking po meds.   CHA2DS2/VAS Stroke Risk Score is at least 5 (CHF, HTN, Age (2), vasc dz).Heparin has been stopped by critical care due to acute CVA and hematoma in abdomen.   4. Anemia: Pt received PRBCs with dialysis. Heparin and Plavix on hold.  CT of abdomen showed rectus sheath hematoma due to dialysis catheter (for PD). Care team is deciding whether to follow watchful waiting or proceed with EGD to further assess. Plan is to hold anticoagulants for at least 24 hours to assess stability of hemoglobin.   5. CKD stage V:  Now on hemodialysis. Volume improving. Management per nephrology.    6. Hypertension: well controlled. Tends to run low on dialysis.   7. Diabetes type 2: Hgb A1c 6. Management per IM.   8. Leukocytosis: On IV antibiotics. Unclear etiology. Management per IM.   9. AMS:  Rapid response was initiated last evening for decline in mentation. CT head showed Interval small hypodensity within the right superior putamen extending into mid corona radiata, likely late acute or subacute infarction. Pt was made a DNR. Palliative care consult ordered.   For questions or updates, please contact [place] Please consult [email or web address] for contact info under Cardiology/STEMI.      Signed, Daune Perch, NP  11/18/2016, 9:38 AM    Attending Note:   The patient was seen and examined.  Agree with assessment and plan as noted above.  Changes made to the above note as needed.  Patient seen and independently examined with Pecolia Ades, NP .   We discussed all aspects of the encounter. I agree with the assessment and plan as stated above.  Acute on chronic combined systolic and diastolic congestive heart failure: Patient continues to have issues with heart failure.  We will not be able to do a heart catheterization until he improves from a neuro and mental status standpoint.  Continue  supportive care for now.   I have spent a total of 40 minutes with patient reviewing hospital  notes , telemetry, EKGs, labs and examining patient as well as establishing an assessment and plan that was discussed with the patient. > 50% of time was spent in direct patient care.    Thayer Headings, Brooke Bonito., MD, [facility] 11/19/2016, 10:48 AM [address],  [place] Pager [phone]

## 2016-11-18 NOTE — Progress Notes (Signed)
  Speech Language Pathology  Patient Details Name: [name] MRN: [id number] DOB: 1929-01-12 Today's Date: 11/18/2016 Time:  -     MBS scheduled for 1:00 today.     Orbie Pyo [place].Ed [employer] [phone]

## 2016-11-18 NOTE — Progress Notes (Signed)
OT Cancellation Note  Patient Details Name: [name] MRN: [id number] DOB: 1928-07-27   Cancelled Treatment:    Reason Eval/Treat Not Completed: Patient not medically ready;Patient declined, no reason specified. Pt not interacting with therapist or verbally answering questions on arrival. Family declined participation with OT today. Note orders for palliative care involvement. Will continue to follow for appropriateness.   Norman Herrlich, MS OTR/L  Pager: [name] 11/18/2016, 1:46 PM

## 2016-11-18 NOTE — Plan of Care (Signed)
Problem: Physical Regulation: Goal: Ability to maintain clinical measurements within normal limits will improve Outcome: Progressing Pt A/O X1. Right sided facial droop noted. Pt week in all extremities, Strengths equal. BP and HR stable throughout shift. RR still elevated high 20's. Spo2 95 on room air. CT scans completed. MD aware of results. Pt denies any pain. Labs drawn. Family at bedside. Will continue to monitor closely.  Problem: Bowel/Gastric: Goal: Will not experience complications related to bowel motility Outcome: Not Progressing RUQ firm/ridged. Pt denies any pain at this time.Carin Hock Sx consulted and saw pt at bedside. Updated family. Active bowel sounds. Pt has tarry stool X1. NPO at this time. Will continue to monitor.

## 2016-11-19 DIAGNOSIS — I5043 Acute on chronic combined systolic (congestive) and diastolic (congestive) heart failure: Secondary | ICD-10-CM

## 2016-11-19 DIAGNOSIS — J9600 Acute respiratory failure, unspecified whether with hypoxia or hypercapnia: Secondary | ICD-10-CM

## 2016-11-19 DIAGNOSIS — Z789 Other specified health status: Secondary | ICD-10-CM

## 2016-11-19 LAB — CBC
HEMATOCRIT: 25.2 % — AB (ref 39.0–52.0)
HEMOGLOBIN: 8.2 g/dL — AB (ref 13.0–17.0)
MCH: 31.7 pg (ref 26.0–34.0)
MCHC: 32.5 g/dL (ref 30.0–36.0)
MCV: 97.3 fL (ref 78.0–100.0)
Platelets: 335 10*3/uL (ref 150–400)
RBC: 2.59 MIL/uL — AB (ref 4.22–5.81)
RDW: 17.2 % — ABNORMAL HIGH (ref 11.5–15.5)
WBC: 14.2 10*3/uL — AB (ref 4.0–10.5)

## 2016-11-19 LAB — RENAL FUNCTION PANEL
ALBUMIN: 2 g/dL — AB (ref 3.5–5.0)
ANION GAP: 17 — AB (ref 5–15)
BUN: 73 mg/dL — ABNORMAL HIGH (ref 6–20)
CALCIUM: 8.9 mg/dL (ref 8.9–10.3)
CO2: 23 mmol/L (ref 22–32)
Chloride: 98 mmol/L — ABNORMAL LOW (ref 101–111)
Creatinine, Ser: 5.61 mg/dL — ABNORMAL HIGH (ref 0.61–1.24)
GFR calc non Af Amer: 8 mL/min — ABNORMAL LOW (ref >60)
GFR, EST AFRICAN AMERICAN: 10 mL/min — AB (ref >60)
GLUCOSE: 103 mg/dL — AB (ref 65–99)
PHOSPHORUS: 7.4 mg/dL — AB (ref 2.5–4.6)
Potassium: 3.8 mmol/L (ref 3.5–5.1)
SODIUM: 138 mmol/L (ref 135–145)

## 2016-11-19 LAB — CULTURE, BODY FLUID-BOTTLE: CULTURE: NO GROWTH

## 2016-11-19 LAB — CULTURE, BODY FLUID W GRAM STAIN -BOTTLE

## 2016-11-19 LAB — PROCALCITONIN: Procalcitonin: 1.67 ng/mL

## 2016-11-19 MED ORDER — HEPARIN SODIUM (PORCINE) 1000 UNIT/ML DIALYSIS
40.0000 [IU]/kg | Freq: Once | INTRAMUSCULAR | Status: DC
  Filled 2016-11-19: qty 4

## 2016-11-19 MED ORDER — PANTOPRAZOLE SODIUM 40 MG PO TBEC
40.0000 mg | DELAYED_RELEASE_TABLET | Freq: Two times a day (BID) | ORAL | Status: DC
  Administered 2016-11-19 – 2016-11-20 (×2): 40 mg via ORAL
  Filled 2016-11-19 (×2): qty 1

## 2016-11-19 MED ORDER — HEPARIN SODIUM (PORCINE) 1000 UNIT/ML DIALYSIS
1000.0000 [IU] | INTRAMUSCULAR | Status: DC | PRN
  Filled 2016-11-19: qty 1

## 2016-11-19 MED ORDER — AMIODARONE HCL 200 MG PO TABS
200.0000 mg | ORAL_TABLET | Freq: Two times a day (BID) | ORAL | Status: DC
  Administered 2016-11-19 – 2016-11-20 (×3): 200 mg via ORAL
  Filled 2016-11-19 (×3): qty 1

## 2016-11-19 MED ORDER — LIDOCAINE-PRILOCAINE 2.5-2.5 % EX CREA
1.0000 "application " | TOPICAL_CREAM | CUTANEOUS | Status: DC | PRN
  Filled 2016-11-19: qty 5

## 2016-11-19 MED ORDER — ALTEPLASE 2 MG IJ SOLR: 2.0000 mg | Freq: Once | INTRAMUSCULAR | Status: DC | PRN

## 2016-11-19 MED ORDER — PENTAFLUOROPROP-TETRAFLUOROETH EX AERO: 1.0000 "application " | INHALATION_SPRAY | CUTANEOUS | Status: DC | PRN

## 2016-11-19 MED ORDER — SODIUM CHLORIDE 0.9 % IV SOLN: 100.0000 mL | INTRAVENOUS | Status: DC | PRN

## 2016-11-19 MED ORDER — TRAZODONE HCL 50 MG PO TABS
25.0000 mg | ORAL_TABLET | Freq: Every day | ORAL | Status: DC
  Administered 2016-11-19: 25 mg via ORAL
  Filled 2016-11-19: qty 1

## 2016-11-19 MED ORDER — LIDOCAINE HCL (PF) 1 % IJ SOLN: 5.0000 mL | INTRAMUSCULAR | Status: DC | PRN

## 2016-11-19 NOTE — Care Management Note (Signed)
Case Management Note  Patient Details  Name: [name] MRN: [id number] Date of Birth: 1928/06/11  Subjective/Objective: Pt presented for Volume Overload-CKD Stage 5 ESRD- Initiated on Peritoneal HD and was not able to tolerate. Pt with increasing confusion now on HD. Plan for HD today and transport to [facility] 11-20-16. CSW is following for disposition needs.                     Action/Plan: CM will continue to monitor for additional needs.   Expected Discharge Date:                  Expected Discharge Plan:  [place]  In-House Referral:  Clinical Social Work  Discharge planning Services  CM Consult  Post Acute Care Choice:  NA Choice offered to:  NA  DME Arranged:  N/A DME Agency:  NA  HH Arranged:  NA HH Agency:  NA  Status of Service:  Completed, signed off  If discussed at [place] of Stay Meetings, dates discussed:    Additional Comments:  Bethena Roys, RN 11/19/2016, 12:53 PM

## 2016-11-19 NOTE — Progress Notes (Signed)
S: Sitting up in bed today, says that he is feeling alright and ready to leave the hospital. Daughters and palliative are at the bedside, we had an open discussion about the plan.   O:BP 124/77   Pulse 95   Temp 99.5 F (37.5 C) (Axillary)   Resp (!) 33   Ht 5\' 4"  (1.626 m)   Wt 171 lb 3.2 oz (77.7 kg)   SpO2 95%   BMI 29.39 kg/m   Intake/Output Summary (Last 24 hours) at 11/19/16 1413 Last data filed at 11/19/16 0500  Gross per 24 hour  Intake            150.3 ml  Output              100 ml  Net             50.3 ml   Intake/Output: I/O last 3 completed shifts: In: 1624.1 [I.V.:1474.1; IV Piggyback:150] Out: 350 [Urine:350]  Intake/Output this shift:  No intake/output data recorded. Weight change: -2 lb 1.3 oz (-0.944 kg) Gen: no acute distress, sitting up in bed focused on eating and conversing with family  CVS: irregularly irregular rhythm, no murmur appreciated  Resp: clear to auscultation over anterior lung fields  Abd: distended, soft, non tender Ext: no peripheral edema    Recent Labs Lab 11/13/16 1449  11/14/16 1209 11/15/16 0022 11/15/16 0456 11/15/16 0947 11/16/16 1008 11/17/16 0410 11/18/16 0529 11/19/16 0658  NA  --   < >  --  138 140 137 138 138 136 138  K  --   < >  --  4.3 4.1 3.9 4.2 4.3 3.7 3.8  CL  --   < >  --  98* 97* 97* 100* 98* 98* 98*  CO2  --   < >  --  25 26 24 25 24 24 23   GLUCOSE  --   < >  --  100* 159* 284* 113* 108* 102* 103*  BUN  --   < >  --  101* 104* 102* 50* 70* 55* 73*  CREATININE  --   < >  --  5.40* 5.70* 5.77* 3.64* 4.66* 4.20* 5.61*  ALBUMIN  --   --  2.7*  --   --  2.4* 2.2* 2.3* 2.1* 2.0*  CALCIUM  --   < >  --  9.0 9.1 8.9 8.3* 8.7* 8.6* 8.9  PHOS 5.4*  --   --   --   --  7.0* 5.6* 6.2* 5.6* 7.4*  AST  --   --  20  --   --   --   --   --   --   --   ALT  --   --  10*  --   --   --   --   --   --   --   < > = values in this interval not displayed. Liver Function Tests:  Recent Labs Lab 11/14/16 1209   11/17/16 0410 11/18/16 0529 11/19/16 0658  AST 20  --   --   --   --   ALT 10*  --   --   --   --   ALKPHOS 90  --   --   --   --   BILITOT 0.9  --   --   --   --   PROT 6.1*  --   --   --   --   ALBUMIN 2.7*  < >  2.3* 2.1* 2.0*  < > = values in this interval not displayed. No results for input(s): LIPASE, AMYLASE in the last 168 hours.  Recent Labs Lab 11/13/16 0350 11/17/16 1857  AMMONIA 50* 26   CBC:  Recent Labs Lab 11/17/16 0410 11/17/16 2200 11/18/16 0529 11/18/16 1923 11/19/16 0658  WBC 15.2* 15.6* 15.5* 15.2* 14.2*  NEUTROABS  --  12.3*  --   --   --   HGB 7.1* 8.0* 8.1* 8.1* 8.2*  HCT 21.7* 24.5* 24.9* 24.7* 25.2*  MCV 97.3 96.1 97.3 96.5 97.3  PLT 307 310 304 321 335   Cardiac Enzymes:  Recent Labs Lab 11/12/16 1559 11/17/16 1857  TROPONINI 1.96* 0.73*   CBG:  Recent Labs Lab 11/17/16 1116 11/17/16 1648 11/17/16 2131 11/18/16 0733 11/18/16 1146  GLUCAP 107* 127* 110* 107* 120*    Iron Studies: No results for input(s): IRON, TIBC, TRANSFERRIN, FERRITIN in the last 72 hours. Studies/Results: Ct Abdomen Pelvis Wo Contrast  Result Date: 11/17/2016 CLINICAL DATA:  83 year old male with abdominal pain and tenderness. Concern for hematoma. EXAM: CT ABDOMEN AND PELVIS WITHOUT CONTRAST TECHNIQUE: Multidetector CT imaging of the abdomen and pelvis was performed following the standard protocol without IV contrast. COMPARISON:  Renal ultrasound dated 04/19/2014 FINDINGS: Evaluation of this exam is limited in the absence of intravenous contrast. Evaluation is also limited due to streak artifact caused by metallic right hip arthroplasty as well as posterior spinal fixation screws. Evaluation is also limited due to motion artifact. Lower chest: Probable small bilateral pleural effusions with associated partial consolidative changes of the lung bases which may represent compressive atelectasis versus pneumonia. Clinical correlation is recommended. There is mild  cardiomegaly. Multi vessel coronary vascular calcification noted. There is hypoattenuation of the cardiac blood pool suggestive of a degree of anemia. Clinical correlation is recommended. There is no intra-abdominal free air. A small pocket of intraperitoneal air in the anterior abdomen (series 3, image 28) noted. Hepatobiliary: The liver and gallbladder appear grossly unremarkable on this noncontrast CT. No intrahepatic biliary ductal dilatation. Pancreas: The pancreas is atrophic otherwise unremarkable. Spleen: Normal in size without focal abnormality. Adrenals/Urinary Tract: The adrenal glands are grossly unremarkable. Bilateral renal atrophy. Multiple bilateral renal lesions measuring up to 2 cm on the left are not well characterized on the CT. There is no hydronephrosis or nephrolithiasis on either side. The visualized ureters appear unremarkable. The urinary bladder is collapsed and not well visualized. Stomach/Bowel: There is extensive sigmoid diverticulosis with muscular hypertrophy. Mild stranding of the right hemipelvis adjacent to the sigmoid likely related to inflammatory changes of the anterior abdominal wall and less likely represent diverticulitis. Evaluation of this area is very limited due to streak artifact. Correlation with clinical exam is recommended. There is no bowel obstruction. Normal appendix. Vascular/Lymphatic: There is advanced atherosclerotic calcification of the aorta and iliac vessels as well as atherosclerotic calcification of the mesenteric vasculature. The IVC is grossly unremarkable. No portal venous gas identified. There is no adenopathy. Reproductive: The prostate and seminal vesicles are not well visualized. A Foley catheter is seen with balloon likely at the base of the bladder. Other: A percutaneous tube enters the left anterior abdominal wall. The tube enters the upper peritoneal cavity and appears to penetrate the distal stomach and traverse the anterior wall of the antrum.  The tube re- enters the right anterior abdominal wall and takes a caudad coarse in the subcutaneous soft tissues anterior to the right rectus muscle. The tube enters the distal aspect of  the right rectus muscle and 3 enters into the peritoneal cavity with coiled tip located in the pelvis. There is a 10 x 5 x 11 cm right rectus sheath hematoma superior to the level of the umbilicus. Musculoskeletal: There is osteopenia with extensive degenerative changes of the spine. Lower thoracic and lumbosacral posterior fusion hardware noted. There is a total right hip arthroplasty. There is chronic deformity of the right hip with areas of cystic formation surrounding the arthroplasty which may represent loosening or related to particle disease. An infectious process is not excluded. There is remodeling of the medial wall of the iliac bone with protrusion of the hardware. There is extension of the uppermost screw of the acetabular cup through the iliac bone and into the right iliacus muscle. IMPRESSION: 1. Peritoneal dialysis catheter enters the left upper anterior abdominal wall and appears to penetrate into the lumen of the distal stomach. The catheter subsequently exits the wall of the stomach and re-enters the right anterior abdominal wall taking a caudad coarse along the right rectus abdominus muscle. The catheter then traverses the inferior right rectus abdominis and enters the peritoneal cavity within the pelvis with tip coiled in the pelvic floor. 2. Small pockets of extraluminal air noted in the anterior upper abdomen adjacent to the gastric antrum where the catheter enters the stomach. 3. Large right rectus sheath hematoma. 4. Right hip arthroplasty with chronic deformity of the right hip and findings suggestive of arthroplasty loosening or particle disease. There is remodeling of the medial wall of the right acetabulum with protrusion of the arthroplasty. The uppermost screw of the acetabular cup component of the  arthroplasty penetrates through the bone into the right iliacus musculature. 5. Other nonemergent findings including extensive diverticulosis, atherosclerosis, and extensive degenerative changes of the spine. These results were called by telephone at the time of interpretation on 11/17/2016 at 9:44 pm to Dr. Oletta Darter, who verbally acknowledged these results. Electronically Signed   By: Anner Crete M.D.   On: 11/17/2016 22:31   Ct Head Wo Contrast  Result Date: 11/17/2016 CLINICAL DATA:  83 y/o  M; altered mental status. EXAM: CT HEAD WITHOUT CONTRAST TECHNIQUE: Contiguous axial images were obtained from the base of the skull through the vertex without intravenous contrast. COMPARISON:  10/31/2016 CT of the head. FINDINGS: Brain: Interval development of lucency within the right superior putamen extending into mid corona radiata compatible with late acute or subacute infarction. No evidence for large vascular territory acute infarction, acute intracranial hemorrhage, or focal mass effect. No hydrocephalus or extra-axial collection. Stable chronic microvascular ischemic changes and parenchymal volume loss of the brain. Vascular: Extensive calcific atherosclerosis of carotid siphons. Skull: No acute osseous abnormality identified. Sinuses/Orbits: No acute finding. Other: Right temporalis region postsurgical changes are stable. IMPRESSION: 1. Interval small hypodensity within the right superior putamen extending into mid corona radiata, likely late acute or subacute infarction. 2. No acute hemorrhage or focal mass effect. 3. Stable chronic microvascular ischemic changes and parenchymal volume loss of the brain. These results will be called to the ordering clinician or representative by the Radiologist Assistant, and communication documented in the PACS or zVision Dashboard. Electronically Signed   By: Kristine Garbe M.D.   On: 11/17/2016 20:47   Dg Chest Port 1 View  Result Date:  11/17/2016 CLINICAL DATA:  Hypertension.  CHF. EXAM: PORTABLE CHEST 1 VIEW COMPARISON:  11/15/2016 FINDINGS: 1729 hours. Low lung volumes. The cardio pericardial silhouette is enlarged. The right infrahilar lung opacity persists and is  stable. Retrocardiac collapse/ consolidation again noted. Probable tiny bilateral pleural effusions. Left subclavian central line tip overlies the proximal SVC. Thoracolumbar fusion hardware noted. Telemetry leads overlie the chest. Degenerative changes evident in both shoulders. IMPRESSION: 1. Interval progression of retrocardiac left base collapse/consolidation. 2. Tiny bilateral pleural effusions. 3. Stable hazy infrahilar opacity at the right lung base. Continued follow-up recommended to ensure resolution. Electronically Signed   By: Misty Stanley M.D.   On: 11/17/2016 19:39   Dg Swallowing Func-speech Pathology  Result Date: 11/18/2016 Objective Swallowing Evaluation: Type of Study: MBS-Modified Barium Swallow Study Patient Details Name: YOSEPH HAILE MRN: [id number] Date of Birth: [birth year] Today's Date: 11/18/2016 Time: SLP Start Time (ACUTE ONLY): 1321-SLP Stop Time (ACUTE ONLY): 1338 SLP Time Calculation (min) (ACUTE ONLY): 17 min Past Medical History: Past Medical History: Diagnosis Date . Acute respiratory failure with hypoxia ([place]) 03/03/2013 . Asthma  . CAD (coronary artery disease)- MI in '09 02/28/2013 . Cancer ([place])   lymphoma  1979   . CHF (congestive heart failure) ([place])   2015 . Chronic renal insufficiency   creatinine ranges around 2.8 . Complication of anesthesia   No anesthesia complications, but concerns about neck positioning due to cervical disease . DOE (dyspnea on exertion)  . Dyspnea   upon exertion . Gout  . HCAP (healthcare-associated pneumonia) 03/03/2013 . Herpes zoster 12/2009  left eye ..........last flare up "a long time" . History of BPH  . HOH (hard of hearing)   hears well out of left ear . Hypertension  . IHD (ischemic heart disease)   Remote  MI in 2009 with late presentation; no reperfusion. Has large area of infarct in the apical anterior area on last nuclear in 2011. Managed medically . Influenza A 03/03/2013 . MI, old 03/2007  ACUTE ANTEROSEPTAL . NSTEMI (non-ST elevated myocardial infarction)- type 2 02/28/2013 . OA (osteoarthritis) of knee   Left knee with revision and replacement of prosthetic L TKR . Peripheral edema  . RBBB (right bundle branch block)  . Renal insufficiency  . Staph infection   states that gentamycin & vancomycin, (30 - 45 days worth) are what did his kidneys in, per his daughter Past Surgical History: Past Surgical History: Procedure Laterality Date . BACK SURGERY    has had 3 surgeries on his back . BILATERAL CARPAL TUNNEL RELEASE   . BUNIONECTOMY   . CAPD INSERTION N/A 10/30/2016  Procedure: LAPAROSCOPIC INSERTION CONTINUOUS AMBULATORY PERITONEAL DIALYSIS  (CAPD) CATHETER   OMENTOPEXY;  Surgeon: Michael Boston, MD;  Location: [facility];  Service: General;  Laterality: N/A; . CARDIAC CATHETERIZATION  03/24/2007 . CARDIOVASCULAR STRESS TEST  04/02/2009  EF 27% AND A LARGE AREA OF INFARCT IN THE APICAL ANTERIOR WITH MILD PERI-INFARCT ISCHEMIA . FRACTURE SURGERY    both ankles . INSERTION OF DIALYSIS CATHETER  10/30/2016  LAPAROSCOPIC INSERTION CONTINUOUS AMBULATORY PERITONEAL DIALYSIS  (CAPD) CATHETER   OMENTOPEXY . JOINT REPLACEMENT    3 on right hip, 2 on knee . KNEE SURGERY  1998  left knee . neck surgeries (x2)   . TOE AMPUTATION    LEFT FOOT . TOTAL KNEE ARTHROPLASTY    left knee . TRANSTHORACIC ECHOCARDIOGRAM  04/04/2009  EF 45-50% . TRANSURETHRAL RESECTION OF PROSTATE   HPI: 83 y.o.malewith medical history significant for medically managed coronary artery disease, chronic combined systolic/diastolic CHF, hypertension, and chronic kidney disease stage IV-V, recent neck surgery, who presented to the emergency department for evaluation of chest discomfort and dyspnea. Admitted for NSTEMI - type 2,  with possible pneumonia. Initial CXR  concerning for PNA, WBC 15.9. Subjective: Pt in bed, family present Assessment / Plan / Recommendation CHL IP CLINICAL IMPRESSIONS 11/18/2016 Clinical Impression Mild oral impairments including prolonged mastication and transit with regular texture, premature spill with nectar due to decreased control. Swallow initiated at the level of the pyriform sinuses due to sensory deficits and motor impairments led to decreased epiglottic deflection and closure resulting in sensed penetration with thin (may have fallen below vocal cords- difficult with shoulders obstructing view). Mild-moderate vallecular residue given reduced epiglottic inversion. Stasis observed in distal portion of esophagus. Pt at risk for aspiration given suspected prior dysphagia with what appears to be cervical osteophyte, deconditioning and recent neck surgery. Recommend Dys 3 texture, nectar thick liquids, pills whole in appleauce, swallow twice and continued ST. SLP Visit Diagnosis Dysphagia, oropharyngeal phase (R13.12) Attention and concentration deficit following -- Frontal lobe and executive function deficit following -- Impact on safety and function (No Data)   CHL IP TREATMENT RECOMMENDATION 11/18/2016 Treatment Recommendations Therapy as outlined in treatment plan below   Prognosis 11/18/2016 Prognosis for Safe Diet Advancement (No Data) Barriers to Reach Goals -- Barriers/Prognosis Comment -- CHL IP DIET RECOMMENDATION 11/18/2016 SLP Diet Recommendations Dysphagia 3 (Mech soft) solids;Nectar thick liquid Liquid Administration via Cup;Straw Medication Administration Whole meds with puree Compensations Slow rate;Small sips/bites;Multiple dry swallows after each bite/sip Postural Changes Seated upright at 90 degrees   CHL IP OTHER RECOMMENDATIONS 11/18/2016 Recommended Consults -- Oral Care Recommendations Oral care BID Other Recommendations --   CHL IP FOLLOW UP RECOMMENDATIONS 11/18/2016 Follow up Recommendations (No Data)   CHL IP FREQUENCY  AND DURATION 11/18/2016 Speech Therapy Frequency (ACUTE ONLY) min 2x/week Treatment Duration 2 weeks      CHL IP ORAL PHASE 11/18/2016 Oral Phase Impaired Oral - Pudding Teaspoon -- Oral - Pudding Cup -- Oral - Honey Teaspoon -- Oral - Honey Cup -- Oral - Nectar Teaspoon -- Oral - Nectar Cup Delayed oral transit;Premature spillage Oral - Nectar Straw Lingual/palatal residue Oral - Thin Teaspoon -- Oral - Thin Cup WFL Oral - Thin Straw -- Oral - Puree -- Oral - Mech Soft -- Oral - Regular Delayed oral transit Oral - Multi-Consistency -- Oral - Pill -- Oral Phase - Comment --  CHL IP PHARYNGEAL PHASE 11/18/2016 Pharyngeal Phase Impaired Pharyngeal- Pudding Teaspoon -- Pharyngeal -- Pharyngeal- Pudding Cup -- Pharyngeal -- Pharyngeal- Honey Teaspoon -- Pharyngeal -- Pharyngeal- Honey Cup -- Pharyngeal -- Pharyngeal- Nectar Teaspoon -- Pharyngeal -- Pharyngeal- Nectar Cup Penetration/Aspiration during swallow;Pharyngeal residue - valleculae;Reduced epiglottic inversion Pharyngeal Material enters airway, remains ABOVE vocal cords then ejected out Pharyngeal- Nectar Straw Delayed swallow initiation-pyriform sinuses Pharyngeal -- Pharyngeal- Thin Teaspoon -- Pharyngeal -- Pharyngeal- Thin Cup Penetration/Aspiration during swallow;Reduced epiglottic inversion;Reduced airway/laryngeal closure Pharyngeal Material enters airway, CONTACTS cords and not ejected out Pharyngeal- Thin Straw -- Pharyngeal -- Pharyngeal- Puree -- Pharyngeal -- Pharyngeal- Mechanical Soft -- Pharyngeal -- Pharyngeal- Regular Pharyngeal residue - valleculae;Reduced epiglottic inversion Pharyngeal -- Pharyngeal- Multi-consistency -- Pharyngeal -- Pharyngeal- Pill -- Pharyngeal -- Pharyngeal Comment --  CHL IP CERVICAL ESOPHAGEAL PHASE 11/18/2016 Cervical Esophageal Phase WFL Pudding Teaspoon -- Pudding Cup -- Honey Teaspoon -- Honey Cup -- Nectar Teaspoon -- Nectar Cup -- Nectar Straw -- Thin Teaspoon -- Thin Cup -- Thin Straw -- Puree -- Mechanical  Soft -- Regular -- Multi-consistency -- Pill -- Cervical Esophageal Comment -- No flowsheet data found. Houston Siren 11/18/2016, 4:29 PM Orbie Pyo Colvin Caroli.Ed Engineer, agricultural [phone]              .  allopurinol  50 mg Oral Daily  . amiodarone  200 mg Oral BID  . calcitRIOL  0.5 mcg Oral QODAY  . metoprolol succinate  25 mg Oral Daily  . multivitamin  1 tablet Oral QHS  . mupirocin ointment   Nasal BID  . pantoprazole  40 mg Oral BID AC  . traZODone  25 mg Oral QHS    BMET    Component Value Date/Time   NA 138 11/19/2016 0658   K 3.8 11/19/2016 0658   CL 98 (L) 11/19/2016 0658   CO2 23 11/19/2016 0658   GLUCOSE 103 (H) 11/19/2016 0658   BUN 73 (H) 11/19/2016 0658   CREATININE 5.61 (H) 11/19/2016 0658   CREATININE 4.26 (H) 05/14/2016 1120   CALCIUM 8.9 11/19/2016 0658   GFRNONAA 8 (L) 11/19/2016 0658   GFRAA 10 (L) 11/19/2016 0658   CBC    Component Value Date/Time   WBC 14.2 (H) 11/19/2016 0658   RBC 2.59 (L) 11/19/2016 0658   HGB 8.2 (L) 11/19/2016 0658   HCT 25.2 (L) 11/19/2016 0658   PLT 335 11/19/2016 0658   MCV 97.3 11/19/2016 0658   MCH 31.7 11/19/2016 0658   MCHC 32.5 11/19/2016 0658   RDW 17.2 (H) 11/19/2016 0658   LYMPHSABS 1.3 11/17/2016 2200   MONOABS 1.8 (H) 11/17/2016 2200   EOSABS 0.2 11/17/2016 2200   BASOSABS 0.1 11/17/2016 2200     Assessment/Plan:  1. Goals of care: Mr. Osmun will go for a final HD treatment today before planned transfer to a hospice facility tomorrow. Consult to IV team for removal of the HD catheter after the session this afternoon.  2. CKD5/ESRD : Less uremic today, K wnl, AG and phos elevated. Will go for HD today.  3. Superior right putamen CVA : more interactive today, facial droop is less obvious  4. Rectus sheath hematoma: We had a discussion with the family this morning and have decided to leave the PD catheter as it is in an effort to avoid possible complications related to removal. Would continue care of exterior  PD catheter with hopes of preventing infection however flushes are no longer necessary.  5. Anemia of chronic disease with acute blood loss - Hgb improved after 1 unit PRBC 2 days ago  6. Mineral metabolic disorder - discontinue calcitriol, despite elevated phos starting a binder is not consistent with GOC  7. DM: primary is following   Ledell Noss

## 2016-11-19 NOTE — Progress Notes (Signed)
PT Cancellation Note  Patient Details Name: [name] MRN: [id number] DOB: 1928-01-31   Cancelled Treatment:    Reason Eval/Treat Not Completed: Other (comment). PT signing off as plan is for pt to d/c to a [facility]. No further acute PT needs.   Clearnce Sorrel Bridgid Printz 11/19/2016, 1:15 PM

## 2016-11-19 NOTE — Progress Notes (Signed)
  Speech Language Pathology Treatment: Dysphagia  Patient Details Name: CEASER EBELING MRN: [id number] DOB: 12-Nov-1928 Today's Date: 11/19/2016 Time: 8309-4076 SLP Time Calculation (min) (ACUTE ONLY): 18 min  Assessment / Plan / Recommendation Clinical Impression  Pt needed moderate assist for feeding. Daughter present initially and began to educate re: MBS recommendations and results until she left to meet with MD. Palliative note seen from yesterday stating plan possibly for one more dialysis treatment and transfer to hospice. Family may desire comfort feeds and allow thin liquids (did not get to speak with dtr). Prolonged oral mastication with bacon and transit; mild lingual residue following eggs. Immediate and delayed coughs present 50% of the time during meal indicative of likely penetration. Mod verbal cues given for two swallows. Will follow up to determine if comfort feeds discussed and education.    HPI HPI: 83 y.o.malewith medical history significant for medically managed coronary artery disease, chronic combined systolic/diastolic CHF, hypertension, and chronic kidney disease stage IV-V, recent neck surgery, who presented to the emergency department for evaluation of chest discomfort and dyspnea. Admitted for NSTEMI - type 2, with possible pneumonia. Initial CXR concerning for PNA, WBC 15.9.      SLP Plan  Continue with current plan of care       Recommendations  Diet recommendations: Thin liquid (soft) Liquids provided via: Cup;Straw Medication Administration: Whole meds with puree Supervision: Staff to assist with self feeding;Full supervision/cueing for compensatory strategies Compensations: Slow rate;Small sips/bites;Multiple dry swallows after each bite/sip Postural Changes and/or Swallow Maneuvers: Seated upright 90 degrees                Oral Care Recommendations: Oral care BID Follow up Recommendations: Skilled Nursing facility SLP Visit Diagnosis: Dysphagia,  oropharyngeal phase (R13.12) Plan: Continue with current plan of care       GO                Houston Siren 11/19/2016, 9:34 AM  Orbie Pyo Colvin Caroli.Ed [employer] [phone]

## 2016-11-19 NOTE — Progress Notes (Signed)
PULMONARY / CRITICAL CARE MEDICINE   Name: [name] MRN: [id number] DOB: 1928-12-31    ADMISSION DATE:  11/11/2016 CONSULTATION DATE:  11/15/16  REFERRING MD:  Dr. Wendee Beavers / [facility]   CHIEF COMPLAINT:  Dyspnea  BRIEF SUMMARY:   83 y/o M with a complex medical history admitted 10/23 with dyspnea and A. Fib with RVR.   On 10/11, the patient underwent peritoneal dialysis catheter placement by Dr. Johney Maine.  Family reports after dialysis after placement he was altered/confused.  CT of the head on 10/12 revealed no acute abnormality but chronic microvascular disease.  At baseline he lives at home with his wife who is a retired OB nurse.he does require assistance with bathing and uses a walker. A typical day for him involves him getting up and sitting at the kitchen table to watch TV and read. Family reports since his surgery for PD catheter placement he has not been at his baseline.  They indicate he has been somewhat confused, anxious and fidgety. His daughter states she took him to the bathroom on the day prior to presentation on 10/23 and he had a bowel movement that smelled like melena (she is a Equities trader as well).  The patient was initially admitted for evaluation of A. Fib with RVR. He was found to have an NSTEMI with a peak troponin of 1.92 (this was the last assessed troponin). He also had an echocardiogram which demonstrated a reduction in his LVEF to 25-30%.  Prior assessment was 40-45% 2015.  During the hospitalization the patient has had progressive anemia. His preoperative hemoglobin was 12.1.  Currently he is 7.1 and received 1 unit of packed cells during hemodialysis.  Family reports they had a difficult time placing a temporary hemodialysis catheter with 2 attempts.  He has not tolerated peritoneal dialysis thus far. Lab review shows an anion gap metabolic acidosis (20 corrected for albumin).  He has had tachypnea and concerns for respiratory distress intermittently requiring BiPAP.     PCCM called back for reevaluation of respiratory status 10/29.   SUBJECTIVE / Interval events:   I spoke with the patient's family yesterday by phone, reviewed the options regarding endoscopy versus watchful waiting.  He remains on broad-spectrum antibiotics. Daughter tells me today that the family has discussed their options in light of his prognosis and his wishes.  They have decided that they will transition him to comfort care.  He will not have further hemodialysis after his treatment is completed today.   VITAL SIGNS: BP 124/77   Pulse 95   Temp 99.5 F (37.5 C) (Axillary)   Resp (!) 33   Ht 5\' 4"  (1.626 m)   Wt 77.7 kg (171 lb 3.2 oz)   SpO2 95%   BMI 29.39 kg/m   HEMODYNAMICS:    VENTILATOR SETTINGS:    INTAKE / OUTPUT: I/O last 3 completed shifts: In: 1624.1 [I.V.:1474.1; IV Piggyback:150] Out: 350 [Urine:350]  PHYSICAL EXAMINATION: General: Chronically ill, comfortable, no distress, lying in bed HEENT: Oropharynx clear, mild right-sided facial droop noted PSY: Awake, alert, calm Neuro: Poorly oriented, able to answer simple questions, moves bilateral upper extremities CV: Irregularly irregular, no murmur heard PULM: Decreased at bilateral bases, mild bilateral inspiratory crackles, no wheezing GI: Mild diffuse tenderness, no rebound, firm raised midline hematoma noted Extremities: No edema Skin: No rash   LABS:  BMET  Recent Labs Lab 11/17/16 0410 11/18/16 0529 11/19/16 0658  NA 138 136 138  K 4.3 3.7 3.8  CL 98* 98*  98*  CO2 24 24 23   BUN 70* 55* 73*  CREATININE 4.66* 4.20* 5.61*  GLUCOSE 108* 102* 103*    Electrolytes  Recent Labs Lab 11/17/16 0410 11/18/16 0529 11/19/16 0658  CALCIUM 8.7* 8.6* 8.9  PHOS 6.2* 5.6* 7.4*    CBC  Recent Labs Lab 11/18/16 0529 11/18/16 1923 11/19/16 0658  WBC 15.5* 15.2* 14.2*  HGB 8.1* 8.1* 8.2*  HCT 24.9* 24.7* 25.2*  PLT 304 321 335    Coag's  Recent Labs Lab 11/15/16 1243  11/17/16 1857  APTT 34  --   INR  --  1.41    Sepsis Markers  Recent Labs Lab 11/17/16 1813 11/17/16 2110 11/18/16 0529 11/19/16 0658  LATICACIDVEN 1.1 0.9  --   --   PROCALCITON 1.52  --  1.66 1.67    ABG  Recent Labs Lab 11/13/16 0128 11/17/16 1815  PHART 7.336* 7.406  PCO2ART 45.2 42.1  PO2ART 135* 93.0    Liver Enzymes  Recent Labs Lab 11/14/16 1209  11/17/16 0410 11/18/16 0529 11/19/16 0658  AST 20  --   --   --   --   ALT 10*  --   --   --   --   ALKPHOS 90  --   --   --   --   BILITOT 0.9  --   --   --   --   ALBUMIN 2.7*  < > 2.3* 2.1* 2.0*  < > = values in this interval not displayed.  Cardiac Enzymes  Recent Labs Lab 11/12/16 1559 11/17/16 1857  TROPONINI 1.96* 0.73*    Glucose  Recent Labs Lab 11/16/16 2119 11/17/16 1116 11/17/16 1648 11/17/16 2131 11/18/16 0733 11/18/16 1146  GLUCAP 114* 107* 127* 110* 107* 120*    Imaging No results found.   STUDIES:  ECHO 10/25 >> LV mildly dilated, systolic function severely reduced, LVEF 25-30%, diffuse hypokinesis, doppler parameters consistent with restrictive physiology, indicative of decreased left ventricular diastolic compliance and/or increased left atrial pressure, mild to mod MR, RV mildly dilated CT abd 10/29 >> rectus sheath hematoma; peritoneal dialysis catheter enters the left upper anterior abdominal wall and impacts the distal stomach, question enters the stomach then exits and reenters the right anterior abdominal wall taking a caudad course along the right rectus abdominis muscle then transverses back into the peritoneal cavity Head Ct 10/29 >> small right superior putamen hypodensity, likely late acute or subacute CVA, no acute bleed  CULTURES: GS Peritoneal Fluid 10/26 >> negative  Peritoneal Fluid Culture 10/26 >>  Aerobic Culture Peritoneal 10/26 >> negative  BCx2 10/24 >> negative   ANTIBIOTICS: Zosyn 10/24 >>  Vanco 10/24 >>   SIGNIFICANT EVENTS: 10/23  Admit  with 2 week hx of chest pain, NSTEMI  LINES/TUBES: PD Catheter 10/12 >>   L [place] HD Cath 10/27 >>   DISCUSSION: 83 y/o M admitted on 10/23 with a 2 week history of chest pain.  Status post peritoneal dialysis catheter on 10/31/16, found to have an NSTEMI.  Complicated course with AFwRVR, melena.  Noted to have a rectus sheath hematoma, possible malposition of his peritoneal dialysis catheter impacting the stomach.  Based on his wishes, family discussions they have decided to stop hemodialysis, transition him to comfort based care.  I agree with this plan.  Support his daughter in this today.  Please call us if we can assist in any other way.  PCCM will sign off.    Baltazar Apo, MD, PhD 11/19/2016,  2:01 PM [facility] [phone] or if no answer [phone]

## 2016-11-19 NOTE — Progress Notes (Signed)
Progress Note  Patient Name: [name] Date of Encounter: 11/19/2016  Primary Cardiologist: Dr. Martinique   Subjective   Confused. Daughter by bedside.   Inpatient Medications    Scheduled Meds: . allopurinol  50 mg Oral Daily  . calcitRIOL  0.5 mcg Oral QODAY  . darbepoetin (ARANESP) injection - DIALYSIS  200 mcg Intravenous Q Mon-HD  . metoprolol succinate  25 mg Oral Daily  . multivitamin  1 tablet Oral QHS  . mupirocin ointment   Nasal BID  . pantoprazole (PROTONIX) IV  40 mg Intravenous Q12H   Continuous Infusions: . amiodarone 30 mg/hr (11/18/16 2214)  . ferric gluconate (FERRLECIT/NULECIT) IV    . nitroGLYCERIN Stopped (11/14/16 0234)  . piperacillin-tazobactam (ZOSYN)  IV 3.375 g (11/19/16 0642)   PRN Meds: acetaminophen **OR** acetaminophen, albuterol, morphine injection, nitroGLYCERIN, ondansetron (ZOFRAN) IV, polyvinyl alcohol, RESOURCE THICKENUP CLEAR, sodium chloride flush, traMADol   Vital Signs    Vitals:   11/18/16 1700 11/18/16 2050 11/18/16 2119 11/19/16 0500  BP: (!) 122/57  (!) 123/57 (!) 122/58  Pulse:  79 (!) 53 72  Resp:  (!) 31 (!) 30 (!) 33  Temp: 98.4 F (36.9 C)  99.3 F (37.4 C) 99.5 F (37.5 C)  TempSrc: Oral  Axillary Axillary  SpO2: 92% 98% 94% 93%  Weight:    171 lb 3.2 oz (77.7 kg)  Height:        Intake/Output Summary (Last 24 hours) at 11/19/16 1023 Last data filed at 11/19/16 0500  Gross per 24 hour  Intake            150.3 ml  Output              100 ml  Net             50.3 ml   Filed Weights   11/17/16 1018 11/18/16 0647 11/19/16 0500  Weight: 173 lb 4.5 oz (78.6 kg) 171 lb 1.2 oz (77.6 kg) 171 lb 3.2 oz (77.7 kg)    Telemetry    NSVT on telemetry- Personally Reviewed  ECG    Sinus brady w/ PVCs - Personally Reviewed  Physical Exam   GEN: No acute distress.  Confused  Neck: No JVD Cardiac: RRR, no murmurs, rubs, or gallops.  Respiratory: Clear to auscultation bilaterally. GI: Soft, nontender,  non-distended  MS: No edema; No deformity. Neuro:  AMS Psych: Normal affect   Labs    Chemistry Recent Labs Lab 11/14/16 1209  11/17/16 0410 11/18/16 0529 11/19/16 0658  NA  --   < > 138 136 138  K  --   < > 4.3 3.7 3.8  CL  --   < > 98* 98* 98*  CO2  --   < > 24 24 23   GLUCOSE  --   < > 108* 102* 103*  BUN  --   < > 70* 55* 73*  CREATININE  --   < > 4.66* 4.20* 5.61*  CALCIUM  --   < > 8.7* 8.6* 8.9  PROT 6.1*  --   --   --   --   ALBUMIN 2.7*  < > 2.3* 2.1* 2.0*  AST 20  --   --   --   --   ALT 10*  --   --   --   --   ALKPHOS 90  --   --   --   --   BILITOT 0.9  --   --   --   --  GFRNONAA  --   < > 10* 12* 8*  GFRAA  --   < > 12* 14* 10*  ANIONGAP  --   < > 16* 14 17*  < > = values in this interval not displayed.   Hematology Recent Labs Lab 11/18/16 0529 11/18/16 1923 11/19/16 0658  WBC 15.5* 15.2* PENDING  RBC 2.56* 2.56* 2.59*  HGB 8.1* 8.1* 8.2*  HCT 24.9* 24.7* 25.2*  MCV 97.3 96.5 97.3  MCH 31.6 31.6 31.7  MCHC 32.5 32.8 32.5  RDW 16.4* 16.7* 17.2*  PLT 304 321 335    Cardiac Enzymes Recent Labs Lab 11/12/16 1559 11/17/16 1857  TROPONINI 1.96* 0.73*   No results for input(s): TROPIPOC in the last 168 hours.   BNPNo results for input(s): BNP, PROBNP in the last 168 hours.   DDimer No results for input(s): DDIMER in the last 168 hours.   Radiology    Ct Abdomen Pelvis Wo Contrast  Result Date: 11/17/2016 CLINICAL DATA:  83 year old male with abdominal pain and tenderness. Concern for hematoma. EXAM: CT ABDOMEN AND PELVIS WITHOUT CONTRAST TECHNIQUE: Multidetector CT imaging of the abdomen and pelvis was performed following the standard protocol without IV contrast. COMPARISON:  Renal ultrasound dated 04/19/2014 FINDINGS: Evaluation of this exam is limited in the absence of intravenous contrast. Evaluation is also limited due to streak artifact caused by metallic right hip arthroplasty as well as posterior spinal fixation screws. Evaluation is  also limited due to motion artifact. Lower chest: Probable small bilateral pleural effusions with associated partial consolidative changes of the lung bases which may represent compressive atelectasis versus pneumonia. Clinical correlation is recommended. There is mild cardiomegaly. Multi vessel coronary vascular calcification noted. There is hypoattenuation of the cardiac blood pool suggestive of a degree of anemia. Clinical correlation is recommended. There is no intra-abdominal free air. A small pocket of intraperitoneal air in the anterior abdomen (series 3, image 28) noted. Hepatobiliary: The liver and gallbladder appear grossly unremarkable on this noncontrast CT. No intrahepatic biliary ductal dilatation. Pancreas: The pancreas is atrophic otherwise unremarkable. Spleen: Normal in size without focal abnormality. Adrenals/Urinary Tract: The adrenal glands are grossly unremarkable. Bilateral renal atrophy. Multiple bilateral renal lesions measuring up to 2 cm on the left are not well characterized on the CT. There is no hydronephrosis or nephrolithiasis on either side. The visualized ureters appear unremarkable. The urinary bladder is collapsed and not well visualized. Stomach/Bowel: There is extensive sigmoid diverticulosis with muscular hypertrophy. Mild stranding of the right hemipelvis adjacent to the sigmoid likely related to inflammatory changes of the anterior abdominal wall and less likely represent diverticulitis. Evaluation of this area is very limited due to streak artifact. Correlation with clinical exam is recommended. There is no bowel obstruction. Normal appendix. Vascular/Lymphatic: There is advanced atherosclerotic calcification of the aorta and iliac vessels as well as atherosclerotic calcification of the mesenteric vasculature. The IVC is grossly unremarkable. No portal venous gas identified. There is no adenopathy. Reproductive: The prostate and seminal vesicles are not well visualized. A  Foley catheter is seen with balloon likely at the base of the bladder. Other: A percutaneous tube enters the left anterior abdominal wall. The tube enters the upper peritoneal cavity and appears to penetrate the distal stomach and traverse the anterior wall of the antrum. The tube re- enters the right anterior abdominal wall and takes a caudad coarse in the subcutaneous soft tissues anterior to the right rectus muscle. The tube enters the distal aspect of the right rectus  muscle and 3 enters into the peritoneal cavity with coiled tip located in the pelvis. There is a 10 x 5 x 11 cm right rectus sheath hematoma superior to the level of the umbilicus. Musculoskeletal: There is osteopenia with extensive degenerative changes of the spine. Lower thoracic and lumbosacral posterior fusion hardware noted. There is a total right hip arthroplasty. There is chronic deformity of the right hip with areas of cystic formation surrounding the arthroplasty which may represent loosening or related to particle disease. An infectious process is not excluded. There is remodeling of the medial wall of the iliac bone with protrusion of the hardware. There is extension of the uppermost screw of the acetabular cup through the iliac bone and into the right iliacus muscle. IMPRESSION: 1. Peritoneal dialysis catheter enters the left upper anterior abdominal wall and appears to penetrate into the lumen of the distal stomach. The catheter subsequently exits the wall of the stomach and re-enters the right anterior abdominal wall taking a caudad coarse along the right rectus abdominus muscle. The catheter then traverses the inferior right rectus abdominis and enters the peritoneal cavity within the pelvis with tip coiled in the pelvic floor. 2. Small pockets of extraluminal air noted in the anterior upper abdomen adjacent to the gastric antrum where the catheter enters the stomach. 3. Large right rectus sheath hematoma. 4. Right hip arthroplasty  with chronic deformity of the right hip and findings suggestive of arthroplasty loosening or particle disease. There is remodeling of the medial wall of the right acetabulum with protrusion of the arthroplasty. The uppermost screw of the acetabular cup component of the arthroplasty penetrates through the bone into the right iliacus musculature. 5. Other nonemergent findings including extensive diverticulosis, atherosclerosis, and extensive degenerative changes of the spine. These results were called by telephone at the time of interpretation on 11/17/2016 at 9:44 pm to Dr. Oletta Darter, who verbally acknowledged these results. Electronically Signed   By: Anner Crete M.D.   On: 11/17/2016 22:31   Ct Head Wo Contrast  Result Date: 11/17/2016 CLINICAL DATA:  83 y/o  M; altered mental status. EXAM: CT HEAD WITHOUT CONTRAST TECHNIQUE: Contiguous axial images were obtained from the base of the skull through the vertex without intravenous contrast. COMPARISON:  10/31/2016 CT of the head. FINDINGS: Brain: Interval development of lucency within the right superior putamen extending into mid corona radiata compatible with late acute or subacute infarction. No evidence for large vascular territory acute infarction, acute intracranial hemorrhage, or focal mass effect. No hydrocephalus or extra-axial collection. Stable chronic microvascular ischemic changes and parenchymal volume loss of the brain. Vascular: Extensive calcific atherosclerosis of carotid siphons. Skull: No acute osseous abnormality identified. Sinuses/Orbits: No acute finding. Other: Right temporalis region postsurgical changes are stable. IMPRESSION: 1. Interval small hypodensity within the right superior putamen extending into mid corona radiata, likely late acute or subacute infarction. 2. No acute hemorrhage or focal mass effect. 3. Stable chronic microvascular ischemic changes and parenchymal volume loss of the brain. These results will be called to the  ordering clinician or representative by the Radiologist Assistant, and communication documented in the PACS or zVision Dashboard. Electronically Signed   By: Kristine Garbe M.D.   On: 11/17/2016 20:47   Dg Chest Port 1 View  Result Date: 11/17/2016 CLINICAL DATA:  Hypertension.  CHF. EXAM: PORTABLE CHEST 1 VIEW COMPARISON:  11/15/2016 FINDINGS: 1729 hours. Low lung volumes. The cardio pericardial silhouette is enlarged. The right infrahilar lung opacity persists and is stable. Retrocardiac collapse/  consolidation again noted. Probable tiny bilateral pleural effusions. Left subclavian central line tip overlies the proximal SVC. Thoracolumbar fusion hardware noted. Telemetry leads overlie the chest. Degenerative changes evident in both shoulders. IMPRESSION: 1. Interval progression of retrocardiac left base collapse/consolidation. 2. Tiny bilateral pleural effusions. 3. Stable hazy infrahilar opacity at the right lung base. Continued follow-up recommended to ensure resolution. Electronically Signed   By: Misty Stanley M.D.   On: 11/17/2016 19:39   Dg Swallowing Func-speech Pathology  Result Date: 11/18/2016 Objective Swallowing Evaluation: Type of Study: MBS-Modified Barium Swallow Study Patient Details Name: [name] MRN: [id number] Date of Birth: 10/29/1928 Today's Date: 11/18/2016 Time: SLP Start Time (ACUTE ONLY): 1321-SLP Stop Time (ACUTE ONLY): 1338 SLP Time Calculation (min) (ACUTE ONLY): 17 min Past Medical History: Past Medical History: Diagnosis Date . Acute respiratory failure with hypoxia ([place]) 03/03/2013 . Asthma  . CAD (coronary artery disease)- MI in '09 02/28/2013 . Cancer ([place])   lymphoma  1979   . CHF (congestive heart failure) ([place])   2015 . Chronic renal insufficiency   creatinine ranges around 2.8 . Complication of anesthesia   No anesthesia complications, but concerns about neck positioning due to cervical disease . DOE (dyspnea on exertion)  . Dyspnea   upon exertion . Gout   . HCAP (healthcare-associated pneumonia) 03/03/2013 . Herpes zoster 12/2009  left eye ..........last flare up "a long time" . History of BPH  . HOH (hard of hearing)   hears well out of left ear . Hypertension  . IHD (ischemic heart disease)   Remote MI in 2009 with late presentation; no reperfusion. Has large area of infarct in the apical anterior area on last nuclear in 2011. Managed medically . Influenza A 03/03/2013 . MI, old 03/2007  ACUTE ANTEROSEPTAL . NSTEMI (non-ST elevated myocardial infarction)- type 2 02/28/2013 . OA (osteoarthritis) of knee   Left knee with revision and replacement of prosthetic L TKR . Peripheral edema  . RBBB (right bundle branch block)  . Renal insufficiency  . Staph infection   states that gentamycin & vancomycin, (30 - 45 days worth) are what did his kidneys in, per his daughter Past Surgical History: Past Surgical History: Procedure Laterality Date . BACK SURGERY    has had 3 surgeries on his back . BILATERAL CARPAL TUNNEL RELEASE   . BUNIONECTOMY   . CAPD INSERTION N/A 10/30/2016  Procedure: LAPAROSCOPIC INSERTION CONTINUOUS AMBULATORY PERITONEAL DIALYSIS  (CAPD) CATHETER   OMENTOPEXY;  Surgeon: Michael Boston, MD;  Location: [facility];  Service: General;  Laterality: N/A; . CARDIAC CATHETERIZATION  03/24/2007 . CARDIOVASCULAR STRESS TEST  04/02/2009  EF 27% AND A LARGE AREA OF INFARCT IN THE APICAL ANTERIOR WITH MILD PERI-INFARCT ISCHEMIA . FRACTURE SURGERY    both ankles . INSERTION OF DIALYSIS CATHETER  10/30/2016  LAPAROSCOPIC INSERTION CONTINUOUS AMBULATORY PERITONEAL DIALYSIS  (CAPD) CATHETER   OMENTOPEXY . JOINT REPLACEMENT    3 on right hip, 2 on knee . KNEE SURGERY  1998  left knee . neck surgeries (x2)   . TOE AMPUTATION    LEFT FOOT . TOTAL KNEE ARTHROPLASTY    left knee . TRANSTHORACIC ECHOCARDIOGRAM  04/04/2009  EF 45-50% . TRANSURETHRAL RESECTION OF PROSTATE   HPI: 83 y.o.malewith medical history significant for medically managed coronary artery disease, chronic combined  systolic/diastolic CHF, hypertension, and chronic kidney disease stage IV-V, recent neck surgery, who presented to the emergency department for evaluation of chest discomfort and dyspnea. Admitted for NSTEMI - type 2, with possible  pneumonia. Initial CXR concerning for PNA, WBC 15.9. Subjective: Pt in bed, family present Assessment / Plan / Recommendation CHL IP CLINICAL IMPRESSIONS 11/18/2016 Clinical Impression Mild oral impairments including prolonged mastication and transit with regular texture, premature spill with nectar due to decreased control. Swallow initiated at the level of the pyriform sinuses due to sensory deficits and motor impairments led to decreased epiglottic deflection and closure resulting in sensed penetration with thin (may have fallen below vocal cords- difficult with shoulders obstructing view). Mild-moderate vallecular residue given reduced epiglottic inversion. Stasis observed in distal portion of esophagus. Pt at risk for aspiration given suspected prior dysphagia with what appears to be cervical osteophyte, deconditioning and recent neck surgery. Recommend Dys 3 texture, nectar thick liquids, pills whole in appleauce, swallow twice and continued ST. SLP Visit Diagnosis Dysphagia, oropharyngeal phase (R13.12) Attention and concentration deficit following -- Frontal lobe and executive function deficit following -- Impact on safety and function (No Data)   CHL IP TREATMENT RECOMMENDATION 11/18/2016 Treatment Recommendations Therapy as outlined in treatment plan below   Prognosis 11/18/2016 Prognosis for Safe Diet Advancement (No Data) Barriers to Reach Goals -- Barriers/Prognosis Comment -- CHL IP DIET RECOMMENDATION 11/18/2016 SLP Diet Recommendations Dysphagia 3 (Mech soft) solids;Nectar thick liquid Liquid Administration via Cup;Straw Medication Administration Whole meds with puree Compensations Slow rate;Small sips/bites;Multiple dry swallows after each bite/sip Postural Changes Seated  upright at 90 degrees   CHL IP OTHER RECOMMENDATIONS 11/18/2016 Recommended Consults -- Oral Care Recommendations Oral care BID Other Recommendations --   CHL IP FOLLOW UP RECOMMENDATIONS 11/18/2016 Follow up Recommendations (No Data)   CHL IP FREQUENCY AND DURATION 11/18/2016 Speech Therapy Frequency (ACUTE ONLY) min 2x/week Treatment Duration 2 weeks      CHL IP ORAL PHASE 11/18/2016 Oral Phase Impaired Oral - Pudding Teaspoon -- Oral - Pudding Cup -- Oral - Honey Teaspoon -- Oral - Honey Cup -- Oral - Nectar Teaspoon -- Oral - Nectar Cup Delayed oral transit;Premature spillage Oral - Nectar Straw Lingual/palatal residue Oral - Thin Teaspoon -- Oral - Thin Cup WFL Oral - Thin Straw -- Oral - Puree -- Oral - Mech Soft -- Oral - Regular Delayed oral transit Oral - Multi-Consistency -- Oral - Pill -- Oral Phase - Comment --  CHL IP PHARYNGEAL PHASE 11/18/2016 Pharyngeal Phase Impaired Pharyngeal- Pudding Teaspoon -- Pharyngeal -- Pharyngeal- Pudding Cup -- Pharyngeal -- Pharyngeal- Honey Teaspoon -- Pharyngeal -- Pharyngeal- Honey Cup -- Pharyngeal -- Pharyngeal- Nectar Teaspoon -- Pharyngeal -- Pharyngeal- Nectar Cup Penetration/Aspiration during swallow;Pharyngeal residue - valleculae;Reduced epiglottic inversion Pharyngeal Material enters airway, remains ABOVE vocal cords then ejected out Pharyngeal- Nectar Straw Delayed swallow initiation-pyriform sinuses Pharyngeal -- Pharyngeal- Thin Teaspoon -- Pharyngeal -- Pharyngeal- Thin Cup Penetration/Aspiration during swallow;Reduced epiglottic inversion;Reduced airway/laryngeal closure Pharyngeal Material enters airway, CONTACTS cords and not ejected out Pharyngeal- Thin Straw -- Pharyngeal -- Pharyngeal- Puree -- Pharyngeal -- Pharyngeal- Mechanical Soft -- Pharyngeal -- Pharyngeal- Regular Pharyngeal residue - valleculae;Reduced epiglottic inversion Pharyngeal -- Pharyngeal- Multi-consistency -- Pharyngeal -- Pharyngeal- Pill -- Pharyngeal -- Pharyngeal Comment --   CHL IP CERVICAL ESOPHAGEAL PHASE 11/18/2016 Cervical Esophageal Phase WFL Pudding Teaspoon -- Pudding Cup -- Honey Teaspoon -- Honey Cup -- Nectar Teaspoon -- Nectar Cup -- Nectar Straw -- Thin Teaspoon -- Thin Cup -- Thin Straw -- Puree -- Mechanical Soft -- Regular -- Multi-consistency -- Pill -- Cervical Esophageal Comment -- No flowsheet data found. [place] Siren 11/18/2016, 4:29 PM Orbie Pyo Colvin Caroli.[employer] [phone]  Cardiac Studies   Echocardiogram 11/13/2016 Study Conclusions - Left ventricle: The cavity size was mildly dilated. Wall thickness was normal. Systolic function was severely reduced. The estimated ejection fraction was in the range of 25% to 30%. Diffuse hypokinesis. There is akinesis of the anterior and apical myocardium. Doppler parameters are consistent with restrictive physiology, indicative of decreased left ventricular diastolic compliance and/or increased left atrial pressure. Doppler parameters are consistent with high ventricular filling pressure. - Mitral valve: Calcified annulus. There was mild to moderate regurgitation. - Right ventricle: The cavity size was mildly dilated.  Impressions: - Definity used; Diffuse hypokinesis with akinesis of the anterior wall and apex; overall severely reduced LV systolic function; restrictive filling; mild LVE; mild to moderate MR; mild RVE.  Patient Profile     83 y.o. male with CAD s/p medically managed STEMI, chronic combined systolic and diastolic CHF, HTN and CKD V here with acute on chronic systolic and diastolic heart failure and possible HCAP.  Assessment & Plan    1. Acute on Chronic Systolic and Diastolic HF: LVEF reduced to 25-30% with akinesis of the anterior and apical myocardium, down from 40-45% in 02/2013. No plans for ischemic w/u given overall condition, poor prognosis. Admitted with volume overload. Volume is being controlled through HD. Plan is for  dialysis again toay   2. NSTEMI: Troponin peaked at 1.96. down trending 0.73. May be demand ischemia in the setting of CKD, acute CHF and afib w/ RVR. No plans for ischemic eval at this time given mental status.   3. Atrial Fibrillation: on IV amiodarone. Diet is being advanced, NPO>>soft diet. Can switch to PO amio when able to tolerate advanced diet. Anticoagulation on hold given acute CVA and hematoma in the abdomen.   4. Anemia: CT of abdomen showed rectus sheath hematoma due to dialysis catheter (for PD). Anticoagulation on hold. Hgb stable at 8.2.   5. NSVT: note on tele. In the setting of low EF. Continue on amiodarone. Monitor electrolytes. Try to keep K ~4 and Mg ~2.0.     For questions or updates, please contact [facility] Please consult [email or web address] for contact info under Cardiology/STEMI.      Signed, Lyda Jester, PA-C  11/19/2016, 10:23 AM    Attending Note:   The patient was seen and examined.  Agree with assessment and plan as noted above.  Changes made to the above note as needed.  Patient seen and independently examined with  [place], PA .   We discussed all aspects of the encounter. I agree with the assessment and plan as stated above.  1.  Acute on chronic combined systolic and diastolic congestive heart failure: Continue supportive care.  He is now getting dialysis.  He had an episode of nonsustained VT this morning.  He has not a candidate for ICD placement.  He is A  DNR. At this point there are no plans to proceed with cardiac catheterization.  2.  Atrial fibrillation.  Agree with transitioning to oral amiodarone.  3.  Acute on chronic kidney disease: He is now getting dialysis.  Further plans per internal medicine team and nephrology.     I have spent a total of 40 minutes with patient reviewing hospital  notes , telemetry, EKGs, labs and examining patient as well as establishing an assessment and plan that was discussed with the  patient. > 50% of time was spent in direct patient care.    Thayer Headings, Brooke Bonito., MD, [facility] 11/19/2016, 10:54 AM [id number],  [address] Pager [phone]

## 2016-11-19 NOTE — Progress Notes (Signed)
Called and spoke with floor nurse. She stated that pt was still in dialysis. This nurse requested that she clarify with Hemo if heparin was given and if given to wait 2 hours and then put in consult for dialysis line to be pulled. VU. Fran Lowes, RN VAST

## 2016-11-19 NOTE — Progress Notes (Addendum)
Daily Progress Note   Patient Name: [name]       Date: 11/19/2016 DOB: [birth year]  Age: 83 y.o. MRN#: [id number] Attending Physician: Domenic Polite, MD Primary Care Physician: Celene Squibb, MD Admit Date: 11/11/2016  Reason for Consultation/Follow-up: Establishing goals of care  Subjective: [name] is sitting up in bed. RN assisting him with eating breakfast. Less agitated this morning than when I saw him yesterday.   Length of Stay: 7  Current Medications: Scheduled Meds:  . allopurinol  50 mg Oral Daily  . amiodarone  200 mg Oral BID  . calcitRIOL  0.5 mcg Oral QODAY  . darbepoetin (ARANESP) injection - DIALYSIS  200 mcg Intravenous Q Mon-HD  . metoprolol succinate  25 mg Oral Daily  . multivitamin  1 tablet Oral QHS  . mupirocin ointment   Nasal BID  . pantoprazole (PROTONIX) IV  40 mg Intravenous Q12H  . traZODone  25 mg Oral QHS    Continuous Infusions: . ferric gluconate (FERRLECIT/NULECIT) IV    . nitroGLYCERIN Stopped (11/14/16 0234)  . piperacillin-tazobactam (ZOSYN)  IV 3.375 g (11/19/16 9396)    PRN Meds: acetaminophen **OR** acetaminophen, albuterol, morphine injection, nitroGLYCERIN, ondansetron (ZOFRAN) IV, polyvinyl alcohol, RESOURCE THICKENUP CLEAR, sodium chloride flush, traMADol  Physical Exam         Constitutional: He appears well-developed. He has a sickly appearance.  HENT:  Head: Normocephalicand atraumatic.  Cardiovascular: Normal rate. An irregularly irregular rhythmpresent.  Pulmonary/Chest: Effort normal. No accessory muscle usage. No tachypnea. No respiratory distress.  Abdominal: Soft. He exhibits distension.  Neurological: He is alert. He is disoriented.  Nursing noteand vitalsreviewed.  Vital Signs: BP (!) 122/58 (BP  Location: Left Arm)   Pulse 72   Temp 99.5 F (37.5 C) (Axillary)   Resp (!) 33   Ht '5\' 4"'$  (1.626 m)   Wt 77.7 kg (171 lb 3.2 oz)   SpO2 93%   BMI 29.39 kg/m  SpO2: SpO2: 93 % O2 Device: O2 Device: Not Delivered O2 Flow Rate: O2 Flow Rate (L/min): 3 L/min  Intake/output summary:  Intake/Output Summary (Last 24 hours) at 11/19/16 1121 Last data filed at 11/19/16 0500  Gross per 24 hour  Intake            150.3 ml  Output  100 ml  Net             50.3 ml   LBM: Last BM Date: 11/18/16 Baseline Weight: Weight: 80.3 kg (177 lb) Most recent weight: Weight: 77.7 kg (171 lb 3.2 oz)       Palliative Assessment/Data:      Patient Active Problem List   Diagnosis Date Noted  . Leukocytosis 11/12/2016  . Hyperglycemia 11/12/2016  . Bradycardia   . Diplopia   . Dizziness   . Hypotension due to drugs   . Essential hypertension 02/06/2015  . Moderate persistent asthma in adult without complication 23/76/2831  . Cardiomyopathy, ischemic- EF 45% 2D 02/27/13 03/01/2013  . Mural thrombus of heart-old, no need for anticoagulation 02/28/2013  . Acute on chronic systolic and diastolic heart failure, NYHA class 4 ([place]) 02/28/2013  . NSTEMI (non-ST elevated myocardial infarction)- type 2 02/28/2013  . CKD (chronic kidney disease), stage V ([place]) 02/26/2013  . Benign prostate hyperplasia 11/24/2011  . Osteoarthritis 07/15/2010  . Right bundle branch block 07/15/2010    Palliative Care Assessment & Plan   HPI: 83 y.o. male with medical history significant for medically managed coronary artery disease, chronic combined systolic/diastolic CHF, hypertension, and chronic kidney disease stage IV-V, now presenting to the emergency department 11/11/16 for evaluation of chest discomfort and dyspnea and melena noted by daughter Investment banker, corporate). He has had significant decline following PD placement 10/30/16 with increased confusion. Hospitalization complicated by concern for malposition of PD  catheter with hematoma, NSTEMI and systolic CHF worsening with EF 25-30% (but concern for bleeding so no anticoagulation), AF RVR, CT head evidence of acute/subacute hypodensity in right superior putamen extending into mid corona radiata (unable to have MRI), and has had HD while hospitalized. Questioned if EGD should be done to verify placement of PD catheter. Family has decided not to pursue EGD and to pursue comfort and hospice.   Assessment: I met at bedside with 2 daughters. They both continue to agree with the plan for transition to po amiodarone and 1 more dialysis treatment to optimize and then transition to hospice facility [facility]. He slept a little better last night - will continue trazodone. Daughters say all family on board for comfort path and hospice given recent events. All questions/concerns addressed. Emotional support and therapeutic listening provided.   Recommendations/Plan:  One last dialysis treatment this afternoon.   Transition to po amiodarone.  Trazodone 25 mg qhs for sleep.   To hospice [facility] tomorrow.   Goals of Care and Additional Recommendations:  Limitations on Scope of Treatment: Full Comfort Care  Code Status:  DNR  Prognosis:   < 4 weeks  Discharge Planning:  Hospice facility  Care plan was discussed with Dr. Broadus John, Dr. Hollie Salk, [place], [place].   Thank you for allowing the Palliative Medicine Team to assist in the care of this patient.   Total Time 50 min Prolonged Time Billed  no       Greater than 50%  of this time was spent counseling and coordinating care related to the above assessment and plan.  Vinie Sill, NP Palliative Medicine Team Pager # [phone] (M-F 8a-5p) Team Phone # [phone] (Nights/Weekends)

## 2016-11-19 NOTE — Procedures (Signed)
Patient seen and examined on Hemodialysis. QB 400 L IJ nontunneled cath.  UF goal 1L.  HD cath to be removed after this treatment for d/c to hospice.  Treatment adjusted as needed.  Madelon Lips MD [facility] pgr [phone] 3:59 PM

## 2016-11-19 NOTE — Progress Notes (Signed)
Instructed pt/family [id number]. Pt unable to follow commands. HOB less than 45*. Line removed/pressure held, no s/sx of bleeding noted, pressure drsg applied and instructed family to notify nurse of any s/sx of bleeding. VU. Fran Lowes, RN VAST

## 2016-11-19 NOTE — Progress Notes (Signed)
CSW made referral to [facility] per family's wishes. Cassandra at hospice accepted referral and indicated they have a bed for patient for tomorrow, 11/1. Per palliative, plan for HD today and discharge to hospice tomorrow. CSW to support with discharge.  Estanislado Emms, [place]

## 2016-11-19 NOTE — Progress Notes (Signed)
PROGRESS NOTE    [name]  EUM:[id number] DOB: Mar 10, 1928 DOA: 11/11/2016 PCP: Celene Squibb, MD    Brief Narrative:  83 y.o. male with medical history significant for medically managed coronary artery disease, chronic combined systolic/diastolic CHF, hypertension, and chronic kidney disease stage IV-V was admitted with volume overload, started peritoneal dialysis and didn't respond very well to this hence switched over to hemodialysis per renal. He was also noted to have NSTEMI, and A. Fib with RVR, restarted on heparin drip and IV amiodarone. Subsequently noted to have a drop in his ejection fraction to 25-30% from prior, also had an episode of melena this admission and rectus sheath hematoma requiring discontinuation of anticoagulation and antiplatelet agents. Due to ongoing failure to thrive as well as patient's wishes regarding not wanting long-term hemodialysis, palliative care has been following him, consideration for residential hospice in the near future  Assessment & Plan:   Chronic kidney disease stage V/ New ESRD -Started peritoneal dialysis this admission and then switched over to hemodialysis per renal due to decreased clearance. -palliative following, plan for another HD treatment and then consideration for residential hospice    NSTEMI (non-ST elevated myocardial infarction) - Cardiology Following - pt was on heparin gtt but had to be held due to acute blood loss anemia , possibly via GI tract and rectus sheath hematoma - No plans for cardiac catheterization and no escalation of care, leaning towards hospice/comfort focused care  New onset PAF - pt on metoprolol and amiodarone -continues to be in A. Fib off and on, heart rate better, change amiodarone to oral -No plans to restart anticoagulation given overall decline and plan for comfort focused care  CVA - unclear whether this occurred prior to admission given reports of altered mental status prior to patient  being admitted. - unable to have MRI due to metal - Off anticoagulation due to suspected recent GI bleed and rectus sheath hematoma -Comfort focused care  Anemia/ suspect GI blood loss/rectus sheath hematoma - Pt has positive fecal occult blood no frank bleeding currently.  - Pt has worsening anemia and required transfusion.  - hgb improved after transfusion and stable now - anticoagulants held due to worsening anemia    Acute on chronic systolic and diastolic heart failure, NYHA class 4 (HCC) - Fluid status controlled with dialysis - pt on metoprolol    Leukocytosis/suspected pna. - Pt is currently on Zosyn. Concern was that initial chest x-ray reported finding suspicious for pneumonia.  -completed 7days will stop Abx now    Hyperglycemia - Hgb a1c is 6.0  DVT prophylaxis: none due to worsening anemia Code Status: DNR Family Communication: daughter at bedside Disposition Plan: Residential Hospice   Consultants:   Nephrology  Cardiology  Neurology  Palliative   Procedures: none   Antimicrobials: none   Subjective: -some confusion overnight, breathing ok  Objective: Vitals:   11/18/16 1700 11/18/16 2050 11/18/16 2119 11/19/16 0500  BP: (!) 122/57  (!) 123/57 (!) 122/58  Pulse:  79 (!) 53 72  Resp:  (!) 31 (!) 30 (!) 33  Temp: 98.4 F (36.9 C)  99.3 F (37.4 C) 99.5 F (37.5 C)  TempSrc: Oral  Axillary Axillary  SpO2: 92% 98% 94% 93%  Weight:    77.7 kg (171 lb 3.2 oz)  Height:        Intake/Output Summary (Last 24 hours) at 11/19/16 1254 Last data filed at 11/19/16 0500  Gross per 24 hour  Intake  150.3 ml  Output              100 ml  Net             50.3 ml   Filed Weights   11/17/16 1018 11/18/16 0647 11/19/16 0500  Weight: 78.6 kg (173 lb 4.5 oz) 77.6 kg (171 lb 1.2 oz) 77.7 kg (171 lb 3.2 oz)    Gen: somnolent, easily arousable, oriented to self and partly to place HEENT: PERRLA, Neck supple, no JVD Lungs: decreased breath  sounds bases CVS: S1-S2 present a regular rate and rhythm Abd: soft, Non tender, non distended, BS present Extremities: No Cyanosis, Clubbing or edema Skin: no new rashes   Data Reviewed: I have personally reviewed following labs and imaging studies  CBC:  Recent Labs Lab 11/17/16 0410 11/17/16 2200 11/18/16 0529 11/18/16 1923 11/19/16 0658  WBC 15.2* 15.6* 15.5* 15.2* 14.2*  NEUTROABS  --  12.3*  --   --   --   HGB 7.1* 8.0* 8.1* 8.1* 8.2*  HCT 21.7* 24.5* 24.9* 24.7* 25.2*  MCV 97.3 96.1 97.3 96.5 97.3  PLT 307 310 304 321 027   Basic Metabolic Panel:  Recent Labs Lab 11/15/16 0947 11/16/16 1008 11/17/16 0410 11/18/16 0529 11/19/16 0658  NA 137 138 138 136 138  K 3.9 4.2 4.3 3.7 3.8  CL 97* 100* 98* 98* 98*  CO2 24 25 24 24 23   GLUCOSE 284* 113* 108* 102* 103*  BUN 102* 50* 70* 55* 73*  CREATININE 5.77* 3.64* 4.66* 4.20* 5.61*  CALCIUM 8.9 8.3* 8.7* 8.6* 8.9  PHOS 7.0* 5.6* 6.2* 5.6* 7.4*   GFR: Estimated Creatinine Clearance: 9.1 mL/min (A) (by C-G formula based on SCr of 5.61 mg/dL (H)). Liver Function Tests:  Recent Labs Lab 11/14/16 1209 11/15/16 0947 11/16/16 1008 11/17/16 0410 11/18/16 0529 11/19/16 0658  AST 20  --   --   --   --   --   ALT 10*  --   --   --   --   --   ALKPHOS 90  --   --   --   --   --   BILITOT 0.9  --   --   --   --   --   PROT 6.1*  --   --   --   --   --   ALBUMIN 2.7* 2.4* 2.2* 2.3* 2.1* 2.0*   No results for input(s): LIPASE, AMYLASE in the last 168 hours.  Recent Labs Lab 11/13/16 0350 11/17/16 1857  AMMONIA 50* 26   Coagulation Profile:  Recent Labs Lab 11/17/16 1857  INR 1.41   Cardiac Enzymes:  Recent Labs Lab 11/12/16 1559 11/17/16 1857  TROPONINI 1.96* 0.73*   BNP (last 3 results) No results for input(s): PROBNP in the last 8760 hours. HbA1C: No results for input(s): HGBA1C in the last 72 hours. CBG:  Recent Labs Lab 11/17/16 1116 11/17/16 1648 11/17/16 2131 11/18/16 0733  11/18/16 1146  GLUCAP 107* 127* 110* 107* 120*   Lipid Profile: No results for input(s): CHOL, HDL, LDLCALC, TRIG, CHOLHDL, LDLDIRECT in the last 72 hours. Thyroid Function Tests: No results for input(s): TSH, T4TOTAL, FREET4, T3FREE, THYROIDAB in the last 72 hours. Anemia Panel: No results for input(s): VITAMINB12, FOLATE, FERRITIN, TIBC, IRON, RETICCTPCT in the last 72 hours. Sepsis Labs:  Recent Labs Lab 11/13/16 0350 11/17/16 1813 11/17/16 2110 11/18/16 0529 11/19/16 0658  PROCALCITON 0.25 1.52  --  1.66 1.67  LATICACIDVEN  --  1.1 0.9  --   --     Recent Results (from the past 240 hour(s))  Culture, blood (routine x 2)     Status: None   Collection Time: 11/12/16  6:32 PM  Result Value Ref Range Status   Specimen Description BLOOD RIGHT HAND  Final   Special Requests   Final    BOTTLES DRAWN AEROBIC AND ANAEROBIC Blood Culture adequate volume   Culture NO GROWTH 5 DAYS  Final   Report Status 11/17/2016 FINAL  Final  Culture, blood (routine x 2)     Status: None   Collection Time: 11/12/16  6:42 PM  Result Value Ref Range Status   Specimen Description BLOOD LEFT HAND  Final   Special Requests   Final    BOTTLES DRAWN AEROBIC AND ANAEROBIC Blood Culture adequate volume   Culture NO GROWTH 5 DAYS  Final   Report Status 11/17/2016 FINAL  Final  MRSA PCR Screening     Status: None   Collection Time: 11/13/16 10:26 PM  Result Value Ref Range Status   MRSA by PCR NEGATIVE NEGATIVE Final    Comment:        The GeneXpert MRSA Assay (FDA approved for NASAL specimens only), is one component of a comprehensive MRSA colonization surveillance program. It is not intended to diagnose MRSA infection nor to guide or monitor treatment for MRSA infections.   Stat Gram stain     Status: None   Collection Time: 11/14/16  2:00 AM  Result Value Ref Range Status   Specimen Description FLUID PERITONEAL  Final   Special Requests NONE  Final   Gram Stain   Final    FEW WBC  PRESENT, PREDOMINANTLY MONONUCLEAR NO ORGANISMS SEEN    Report Status 11/14/2016 FINAL  Final  Aerobic Culture (superficial specimen)     Status: None   Collection Time: 11/14/16  2:00 AM  Result Value Ref Range Status   Specimen Description FLUID PERITONEAL  Final   Special Requests NONE  Final   Culture NO GROWTH 2 DAYS  Final   Report Status 11/16/2016 FINAL  Final  Culture, body fluid-bottle     Status: None (Preliminary result)   Collection Time: 11/14/16 11:41 AM  Result Value Ref Range Status   Specimen Description PERITONEAL  Final   Special Requests NONE  Final   Culture NO GROWTH 4 DAYS  Final   Report Status PENDING  Incomplete  Gram stain     Status: None   Collection Time: 11/14/16 11:41 AM  Result Value Ref Range Status   Specimen Description PERITONEAL  Final   Special Requests NONE  Final   Gram Stain   Final    RARE WBC PRESENT, PREDOMINANTLY MONONUCLEAR NO ORGANISMS SEEN    Report Status 11/14/2016 FINAL  Final     Radiology Studies: Ct Abdomen Pelvis Wo Contrast  Result Date: 11/17/2016 CLINICAL DATA:  83 year old male with abdominal pain and tenderness. Concern for hematoma. EXAM: CT ABDOMEN AND PELVIS WITHOUT CONTRAST TECHNIQUE: Multidetector CT imaging of the abdomen and pelvis was performed following the standard protocol without IV contrast. COMPARISON:  Renal ultrasound dated 04/19/2014 FINDINGS: Evaluation of this exam is limited in the absence of intravenous contrast. Evaluation is also limited due to streak artifact caused by metallic right hip arthroplasty as well as posterior spinal fixation screws. Evaluation is also limited due to motion artifact. Lower chest: Probable small bilateral pleural effusions with associated partial consolidative changes of the lung bases which  may represent compressive atelectasis versus pneumonia. Clinical correlation is recommended. There is mild cardiomegaly. Multi vessel coronary vascular calcification noted. There is  hypoattenuation of the cardiac blood pool suggestive of a degree of anemia. Clinical correlation is recommended. There is no intra-abdominal free air. A small pocket of intraperitoneal air in the anterior abdomen (series 3, image 28) noted. Hepatobiliary: The liver and gallbladder appear grossly unremarkable on this noncontrast CT. No intrahepatic biliary ductal dilatation. Pancreas: The pancreas is atrophic otherwise unremarkable. Spleen: Normal in size without focal abnormality. Adrenals/Urinary Tract: The adrenal glands are grossly unremarkable. Bilateral renal atrophy. Multiple bilateral renal lesions measuring up to 2 cm on the left are not well characterized on the CT. There is no hydronephrosis or nephrolithiasis on either side. The visualized ureters appear unremarkable. The urinary bladder is collapsed and not well visualized. Stomach/Bowel: There is extensive sigmoid diverticulosis with muscular hypertrophy. Mild stranding of the right hemipelvis adjacent to the sigmoid likely related to inflammatory changes of the anterior abdominal wall and less likely represent diverticulitis. Evaluation of this area is very limited due to streak artifact. Correlation with clinical exam is recommended. There is no bowel obstruction. Normal appendix. Vascular/Lymphatic: There is advanced atherosclerotic calcification of the aorta and iliac vessels as well as atherosclerotic calcification of the mesenteric vasculature. The IVC is grossly unremarkable. No portal venous gas identified. There is no adenopathy. Reproductive: The prostate and seminal vesicles are not well visualized. A Foley catheter is seen with balloon likely at the base of the bladder. Other: A percutaneous tube enters the left anterior abdominal wall. The tube enters the upper peritoneal cavity and appears to penetrate the distal stomach and traverse the anterior wall of the antrum. The tube re- enters the right anterior abdominal wall and takes a caudad  coarse in the subcutaneous soft tissues anterior to the right rectus muscle. The tube enters the distal aspect of the right rectus muscle and 3 enters into the peritoneal cavity with coiled tip located in the pelvis. There is a 10 x 5 x 11 cm right rectus sheath hematoma superior to the level of the umbilicus. Musculoskeletal: There is osteopenia with extensive degenerative changes of the spine. Lower thoracic and lumbosacral posterior fusion hardware noted. There is a total right hip arthroplasty. There is chronic deformity of the right hip with areas of cystic formation surrounding the arthroplasty which may represent loosening or related to particle disease. An infectious process is not excluded. There is remodeling of the medial wall of the iliac bone with protrusion of the hardware. There is extension of the uppermost screw of the acetabular cup through the iliac bone and into the right iliacus muscle. IMPRESSION: 1. Peritoneal dialysis catheter enters the left upper anterior abdominal wall and appears to penetrate into the lumen of the distal stomach. The catheter subsequently exits the wall of the stomach and re-enters the right anterior abdominal wall taking a caudad coarse along the right rectus abdominus muscle. The catheter then traverses the inferior right rectus abdominis and enters the peritoneal cavity within the pelvis with tip coiled in the pelvic floor. 2. Small pockets of extraluminal air noted in the anterior upper abdomen adjacent to the gastric antrum where the catheter enters the stomach. 3. Large right rectus sheath hematoma. 4. Right hip arthroplasty with chronic deformity of the right hip and findings suggestive of arthroplasty loosening or particle disease. There is remodeling of the medial wall of the right acetabulum with protrusion of the arthroplasty. The uppermost screw of  the acetabular cup component of the arthroplasty penetrates through the bone into the right iliacus musculature.  5. Other nonemergent findings including extensive diverticulosis, atherosclerosis, and extensive degenerative changes of the spine. These results were called by telephone at the time of interpretation on 11/17/2016 at 9:44 pm to Dr. Oletta Darter, who verbally acknowledged these results. Electronically Signed   By: Anner Crete M.D.   On: 11/17/2016 22:31   Ct Head Wo Contrast  Result Date: 11/17/2016 CLINICAL DATA:  83 y/o  M; altered mental status. EXAM: CT HEAD WITHOUT CONTRAST TECHNIQUE: Contiguous axial images were obtained from the base of the skull through the vertex without intravenous contrast. COMPARISON:  10/31/2016 CT of the head. FINDINGS: Brain: Interval development of lucency within the right superior putamen extending into mid corona radiata compatible with late acute or subacute infarction. No evidence for large vascular territory acute infarction, acute intracranial hemorrhage, or focal mass effect. No hydrocephalus or extra-axial collection. Stable chronic microvascular ischemic changes and parenchymal volume loss of the brain. Vascular: Extensive calcific atherosclerosis of carotid siphons. Skull: No acute osseous abnormality identified. Sinuses/Orbits: No acute finding. Other: Right temporalis region postsurgical changes are stable. IMPRESSION: 1. Interval small hypodensity within the right superior putamen extending into mid corona radiata, likely late acute or subacute infarction. 2. No acute hemorrhage or focal mass effect. 3. Stable chronic microvascular ischemic changes and parenchymal volume loss of the brain. These results will be called to the ordering clinician or representative by the Radiologist Assistant, and communication documented in the PACS or zVision Dashboard. Electronically Signed   By: Kristine Garbe M.D.   On: 11/17/2016 20:47   Dg Chest Port 1 View  Result Date: 11/17/2016 CLINICAL DATA:  Hypertension.  CHF. EXAM: PORTABLE CHEST 1 VIEW COMPARISON:   11/15/2016 FINDINGS: 1729 hours. Low lung volumes. The cardio pericardial silhouette is enlarged. The right infrahilar lung opacity persists and is stable. Retrocardiac collapse/ consolidation again noted. Probable tiny bilateral pleural effusions. Left subclavian central line tip overlies the proximal SVC. Thoracolumbar fusion hardware noted. Telemetry leads overlie the chest. Degenerative changes evident in both shoulders. IMPRESSION: 1. Interval progression of retrocardiac left base collapse/consolidation. 2. Tiny bilateral pleural effusions. 3. Stable hazy infrahilar opacity at the right lung base. Continued follow-up recommended to ensure resolution. Electronically Signed   By: Misty Stanley M.D.   On: 11/17/2016 19:39   Dg Swallowing Func-speech Pathology  Result Date: 11/18/2016 Objective Swallowing Evaluation: Type of Study: MBS-Modified Barium Swallow Study Patient Details Name: [name] MRN: [id number] Date of Birth: 17-Feb-1928 Today's Date: 11/18/2016 Time: SLP Start Time (ACUTE ONLY): 1321-SLP Stop Time (ACUTE ONLY): 1338 SLP Time Calculation (min) (ACUTE ONLY): 17 min Past Medical History: Past Medical History: Diagnosis Date . Acute respiratory failure with hypoxia ([place]) 03/03/2013 . Asthma  . CAD (coronary artery disease)- MI in '09 02/28/2013 . Cancer ([place])   lymphoma  1979   . CHF (congestive heart failure) ([place])   2015 . Chronic renal insufficiency   creatinine ranges around 2.8 . Complication of anesthesia   No anesthesia complications, but concerns about neck positioning due to cervical disease . DOE (dyspnea on exertion)  . Dyspnea   upon exertion . Gout  . HCAP (healthcare-associated pneumonia) 03/03/2013 . Herpes zoster 12/2009  left eye ..........last flare up "a long time" . History of BPH  . HOH (hard of hearing)   hears well out of left ear . Hypertension  . IHD (ischemic heart disease)   Remote MI in 2009  with late presentation; no reperfusion. Has large area of infarct in the  apical anterior area on last nuclear in 2011. Managed medically . Influenza A 03/03/2013 . MI, old 03/2007  ACUTE ANTEROSEPTAL . NSTEMI (non-ST elevated myocardial infarction)- type 2 02/28/2013 . OA (osteoarthritis) of knee   Left knee with revision and replacement of prosthetic L TKR . Peripheral edema  . RBBB (right bundle branch block)  . Renal insufficiency  . Staph infection   states that gentamycin & vancomycin, (30 - 45 days worth) are what did his kidneys in, per his daughter Past Surgical History: Past Surgical History: Procedure Laterality Date . BACK SURGERY    has had 3 surgeries on his back . BILATERAL CARPAL TUNNEL RELEASE   . BUNIONECTOMY   . CAPD INSERTION N/A 10/30/2016  Procedure: LAPAROSCOPIC INSERTION CONTINUOUS AMBULATORY PERITONEAL DIALYSIS  (CAPD) CATHETER   OMENTOPEXY;  Surgeon: Michael Boston, MD;  Location: [facility];  Service: General;  Laterality: N/A; . CARDIAC CATHETERIZATION  03/24/2007 . CARDIOVASCULAR STRESS TEST  04/02/2009  EF 27% AND A LARGE AREA OF INFARCT IN THE APICAL ANTERIOR WITH MILD PERI-INFARCT ISCHEMIA . FRACTURE SURGERY    both ankles . INSERTION OF DIALYSIS CATHETER  10/30/2016  LAPAROSCOPIC INSERTION CONTINUOUS AMBULATORY PERITONEAL DIALYSIS  (CAPD) CATHETER   OMENTOPEXY . JOINT REPLACEMENT    3 on right hip, 2 on knee . KNEE SURGERY  1998  left knee . neck surgeries (x2)   . TOE AMPUTATION    LEFT FOOT . TOTAL KNEE ARTHROPLASTY    left knee . TRANSTHORACIC ECHOCARDIOGRAM  04/04/2009  EF 45-50% . TRANSURETHRAL RESECTION OF PROSTATE   HPI: 83 y.o.malewith medical history significant for medically managed coronary artery disease, chronic combined systolic/diastolic CHF, hypertension, and chronic kidney disease stage IV-V, recent neck surgery, who presented to the emergency department for evaluation of chest discomfort and dyspnea. Admitted for NSTEMI - type 2, with possible pneumonia. Initial CXR concerning for PNA, WBC 15.9. Subjective: Pt in bed, family present Assessment / Plan  / Recommendation CHL IP CLINICAL IMPRESSIONS 11/18/2016 Clinical Impression Mild oral impairments including prolonged mastication and transit with regular texture, premature spill with nectar due to decreased control. Swallow initiated at the level of the pyriform sinuses due to sensory deficits and motor impairments led to decreased epiglottic deflection and closure resulting in sensed penetration with thin (may have fallen below vocal cords- difficult with shoulders obstructing view). Mild-moderate vallecular residue given reduced epiglottic inversion. Stasis observed in distal portion of esophagus. Pt at risk for aspiration given suspected prior dysphagia with what appears to be cervical osteophyte, deconditioning and recent neck surgery. Recommend Dys 3 texture, nectar thick liquids, pills whole in appleauce, swallow twice and continued ST. SLP Visit Diagnosis Dysphagia, oropharyngeal phase (R13.12) Attention and concentration deficit following -- Frontal lobe and executive function deficit following -- Impact on safety and function (No Data)   CHL IP TREATMENT RECOMMENDATION 11/18/2016 Treatment Recommendations Therapy as outlined in treatment plan below   Prognosis 11/18/2016 Prognosis for Safe Diet Advancement (No Data) Barriers to Reach Goals -- Barriers/Prognosis Comment -- CHL IP DIET RECOMMENDATION 11/18/2016 SLP Diet Recommendations Dysphagia 3 (Mech soft) solids;Nectar thick liquid Liquid Administration via Cup;Straw Medication Administration Whole meds with puree Compensations Slow rate;Small sips/bites;Multiple dry swallows after each bite/sip Postural Changes Seated upright at 90 degrees   CHL IP OTHER RECOMMENDATIONS 11/18/2016 Recommended Consults -- Oral Care Recommendations Oral care BID Other Recommendations --   CHL IP FOLLOW UP RECOMMENDATIONS 11/18/2016 Follow up Recommendations (No  Data)   CHL IP FREQUENCY AND DURATION 11/18/2016 Speech Therapy Frequency (ACUTE ONLY) min 2x/week Treatment  Duration 2 weeks      CHL IP ORAL PHASE 11/18/2016 Oral Phase Impaired Oral - Pudding Teaspoon -- Oral - Pudding Cup -- Oral - Honey Teaspoon -- Oral - Honey Cup -- Oral - Nectar Teaspoon -- Oral - Nectar Cup Delayed oral transit;Premature spillage Oral - Nectar Straw Lingual/palatal residue Oral - Thin Teaspoon -- Oral - Thin Cup WFL Oral - Thin Straw -- Oral - Puree -- Oral - Mech Soft -- Oral - Regular Delayed oral transit Oral - Multi-Consistency -- Oral - Pill -- Oral Phase - Comment --  CHL IP PHARYNGEAL PHASE 11/18/2016 Pharyngeal Phase Impaired Pharyngeal- Pudding Teaspoon -- Pharyngeal -- Pharyngeal- Pudding Cup -- Pharyngeal -- Pharyngeal- Honey Teaspoon -- Pharyngeal -- Pharyngeal- Honey Cup -- Pharyngeal -- Pharyngeal- Nectar Teaspoon -- Pharyngeal -- Pharyngeal- Nectar Cup Penetration/Aspiration during swallow;Pharyngeal residue - valleculae;Reduced epiglottic inversion Pharyngeal Material enters airway, remains ABOVE vocal cords then ejected out Pharyngeal- Nectar Straw Delayed swallow initiation-pyriform sinuses Pharyngeal -- Pharyngeal- Thin Teaspoon -- Pharyngeal -- Pharyngeal- Thin Cup Penetration/Aspiration during swallow;Reduced epiglottic inversion;Reduced airway/laryngeal closure Pharyngeal Material enters airway, CONTACTS cords and not ejected out Pharyngeal- Thin Straw -- Pharyngeal -- Pharyngeal- Puree -- Pharyngeal -- Pharyngeal- Mechanical Soft -- Pharyngeal -- Pharyngeal- Regular Pharyngeal residue - valleculae;Reduced epiglottic inversion Pharyngeal -- Pharyngeal- Multi-consistency -- Pharyngeal -- Pharyngeal- Pill -- Pharyngeal -- Pharyngeal Comment --  CHL IP CERVICAL ESOPHAGEAL PHASE 11/18/2016 Cervical Esophageal Phase WFL Pudding Teaspoon -- Pudding Cup -- Honey Teaspoon -- Honey Cup -- Nectar Teaspoon -- Nectar Cup -- Nectar Straw -- Thin Teaspoon -- Thin Cup -- Thin Straw -- Puree -- Mechanical Soft -- Regular -- Multi-consistency -- Pill -- Cervical Esophageal Comment -- No  flowsheet data found. [place] Siren 11/18/2016, 4:29 PM Orbie Pyo Colvin Caroli.Ed CCC-SLP Pager [phone]              Scheduled Meds: . allopurinol  50 mg Oral Daily  . amiodarone  200 mg Oral BID  . calcitRIOL  0.5 mcg Oral QODAY  . metoprolol succinate  25 mg Oral Daily  . multivitamin  1 tablet Oral QHS  . mupirocin ointment   Nasal BID  . pantoprazole  40 mg Oral BID AC  . traZODone  25 mg Oral QHS   Continuous Infusions: . piperacillin-tazobactam (ZOSYN)  IV 3.375 g (11/19/16 0642)     LOS: 7 days    Time spent: > 35 min  Aison Malveaux, MD [facility] Page via [email or web address] password [email or web address] will  If 7PM-7AM, please contact night-coverage [email or web address] Password [email or web address] 11/19/2016, 12:54 PM

## 2016-11-19 NOTE — Progress Notes (Signed)
OT Cancellation Note and Discharge  Patient Details Name: [name] MRN: [id number] DOB: [birth year]   Cancelled Treatment:    Reason Eval/Treat Not Completed:  (OT signing off, pt to d/c to hospice facility.)  [name] 11/19/2016, 2:26 PM  11/19/2016 Nestor Lewandowsky, OTR/L Pager: [phone]

## 2016-11-20 DIAGNOSIS — Z7189 Other specified counseling: Secondary | ICD-10-CM

## 2016-11-20 DIAGNOSIS — Z515 Encounter for palliative care: Secondary | ICD-10-CM

## 2016-11-20 DIAGNOSIS — J9601 Acute respiratory failure with hypoxia: Secondary | ICD-10-CM

## 2016-11-20 DIAGNOSIS — J9602 Acute respiratory failure with hypercapnia: Secondary | ICD-10-CM

## 2016-11-20 DIAGNOSIS — N185 Chronic kidney disease, stage 5: Secondary | ICD-10-CM

## 2016-11-20 MED ORDER — MORPHINE SULFATE 20 MG/5ML PO SOLN: 5.0000 mg | ORAL | 0 refills | Status: AC | PRN

## 2016-11-20 MED ORDER — TRAZODONE HCL 50 MG PO TABS: 25.0000 mg | ORAL_TABLET | Freq: Every day | ORAL | Status: AC

## 2016-11-20 MED ORDER — PANTOPRAZOLE SODIUM 40 MG PO TBEC: 40.0000 mg | DELAYED_RELEASE_TABLET | Freq: Every day | ORAL | Status: AC

## 2016-11-20 MED ORDER — METOPROLOL SUCCINATE ER 25 MG PO TB24: 25.0000 mg | ORAL_TABLET | Freq: Every day | ORAL | Status: AC

## 2016-11-20 MED ORDER — AMIODARONE HCL 200 MG PO TABS: 200.0000 mg | ORAL_TABLET | Freq: Two times a day (BID) | ORAL | 0 refills | Status: AC

## 2016-11-20 NOTE — Progress Notes (Signed)
Daily Progress Note   Patient Name: [name]       Date: 11/20/2016 DOB: 1928-05-21  Age: 83 y.o. MRN#: [id number] Attending Physician: Domenic Polite, MD Primary Care Physician: Celene Squibb, MD Admit Date: 11/11/2016  Reason for Consultation/Follow-up: Establishing goals of care  Subjective: [name] is feeding Mr. Lembke breakfast this morning. He appears calm and comfortable and is without complaint.   Length of Stay: 8  Current Medications: Scheduled Meds:  . allopurinol  50 mg Oral Daily  . amiodarone  200 mg Oral BID  . heparin  40 Units/kg Dialysis Once in dialysis  . metoprolol succinate  25 mg Oral Daily  . multivitamin  1 tablet Oral QHS  . mupirocin ointment   Nasal BID  . pantoprazole  40 mg Oral BID AC  . traZODone  25 mg Oral QHS    Continuous Infusions: . sodium chloride    . sodium chloride      PRN Meds: sodium chloride, sodium chloride, acetaminophen **OR** acetaminophen, albuterol, alteplase, heparin, lidocaine (PF), lidocaine-prilocaine, morphine injection, nitroGLYCERIN, ondansetron (ZOFRAN) IV, pentafluoroprop-tetrafluoroeth, polyvinyl alcohol, RESOURCE THICKENUP CLEAR, sodium chloride flush, traMADol  Physical Exam         Constitutional: He appears well-developed. He has a sickly appearance.  HENT:  Head: Normocephalic and atraumatic.  Cardiovascular: Normal rate.  An irregularly irregular rhythm present.  Pulmonary/Chest: Effort normal. No accessory muscle usage. No tachypnea. No respiratory distress.  Abdominal: Soft. He exhibits distension.  Neurological: He is alert. He is disoriented.  Nursing note and vitals reviewed.  Vital Signs: BP (!) 123/56   Pulse 83   Temp 97.7 F (36.5 C) (Oral)   Resp (!) 22   Ht '5\' 4"'$  (1.626 m)   Wt  79.9 kg (176 lb 3.2 oz)   SpO2 95%   BMI 30.24 kg/m  SpO2: SpO2: 95 % O2 Device: O2 Device: Not Delivered O2 Flow Rate: O2 Flow Rate (L/min): 3 L/min  Intake/output summary:   Intake/Output Summary (Last 24 hours) at 11/20/16 1240 Last data filed at 11/19/16 1725  Gross per 24 hour  Intake              240 ml  Output             1000 ml  Net             -  760 ml   LBM: Last BM Date: 11/19/16 Baseline Weight: Weight: 80.3 kg (177 lb) Most recent weight: Weight: 79.9 kg (176 lb 3.2 oz)       Palliative Assessment/Data: 30%      Patient Active Problem List   Diagnosis Date Noted  . Leukocytosis 11/12/2016  . Hyperglycemia 11/12/2016  . Bradycardia   . Diplopia   . Dizziness   . Hypotension due to drugs   . Essential hypertension 02/06/2015  . Moderate persistent asthma in adult without complication 00/92/3300  . SOB (shortness of breath) 03/03/2013  . Acute respiratory failure ([place]) 03/03/2013  . Cardiomyopathy, ischemic- EF 45% 2D 02/27/13 03/01/2013  . Mural thrombus of heart-old, no need for anticoagulation 02/28/2013  . Acute on chronic systolic and diastolic heart failure, NYHA class 4 ([place]) 02/28/2013  . NSTEMI (non-ST elevated myocardial infarction)- type 2 02/28/2013  . CKD (chronic kidney disease), stage V ([place]) 02/26/2013  . Benign prostate hyperplasia 11/24/2011  . Osteoarthritis 07/15/2010  . Right bundle branch block 07/15/2010    Palliative Care Assessment & Plan   HPI: 83 y.o. male with medical history significant for medically managed coronary artery disease, chronic combined systolic/diastolic CHF, hypertension, and chronic kidney disease stage IV-V, now presenting to the emergency department 11/11/16 for evaluation of chest discomfort and dyspnea and melena noted by daughter Investment banker, corporate). He has had significant decline following PD placement 10/30/16 with increased confusion. Hospitalization complicated by concern for malposition of PD catheter with hematoma,  NSTEMI and systolic CHF worsening with EF 25-30% (but concern for bleeding so no anticoagulation), AF RVR, CT head evidence of acute/subacute hypodensity in right superior putamen extending into mid corona radiata (unable to have MRI), and has had HD while hospitalized. Questioned if EGD should be done to verify placement of PD catheter. Family has decided not to pursue EGD and to pursue comfort and hospice.   Assessment: I met at bedside with daughter, [name]. Plan continues to be for hospice placement today and they are anxious to get him there now. He slept a little last night. No further complaints. With minimal urine output so fluid overload and SOB a concern for future as well as inability to be anticoagulated. This makes prognosis grim and family understands. Focus is comfort and QOL. All questions/concerns addressed. To hospice today.   Recommendations/Plan:  One last dialysis treatment completed yesterday. Minimal urine output.   Transitioned to po amiodarone.  Trazodone 25 mg qhs for sleep.   To hospice [facility] today.   Goals of Care and Additional Recommendations:  Limitations on Scope of Treatment: Full Comfort Care  Code Status:  DNR  Prognosis:   < 4 weeks  Discharge Planning:  Hospice facility  Care plan was discussed with Dr. Broadus John, [place].  Thank you for allowing the Palliative Medicine Team to assist in the care of this patient.   Total Time 25 min Prolonged Time Billed  no       Greater than 50%  of this time was spent counseling and coordinating care related to the above assessment and plan.  Vinie Sill, NP Palliative Medicine Team Pager # [phone] (M-F 8a-5p) Team Phone # [phone] (Nights/Weekends)

## 2016-11-20 NOTE — Progress Notes (Signed)
Patient will discharge to [name]. Anticipated discharge date: 11/20/16 Family notified: Jamesetta [place], daughter Transportation by: [facility]  Nurse to call report to [phone].   CSW signing off.  Estanislado Emms, [place]  Clinical Social Worker

## 2016-11-20 NOTE — Progress Notes (Signed)
Pt is resting in bed.  Pt wife said today is the first day that pt has been able to rest. Pt daughter remains bedside and other family left to go home.  No issues to report at this time.  Will continue to monitor for safety.

## 2016-11-20 NOTE — Discharge Summary (Signed)
Physician Discharge Summary  [name] HEN:[id number] DOB: [birth year] DOA: 11/11/2016  PCP: Celene Squibb, MD  Admit date: 11/11/2016 Discharge date: 11/20/2016  Time spent: 35 minutes  Recommendations for Outpatient Follow-up:  1. [facility]   Discharge Diagnoses:  Principal Problem:   NSTEMI (non-ST elevated myocardial infarction)- type 2   ESRD   CKD (chronic kidney disease), stage V (HCC)   Volume overload   Acute blood loss anemia   Rectus sheath hematoma   Acute on chronic systolic and diastolic heart failure, NYHA class 4 (HCC)   Leukocytosis   Hyperglycemia   Discharge Condition: guarded  Diet recommendation: comfort feeds  Filed Weights   11/19/16 1420 11/19/16 1725 11/20/16 0530  Weight: 79.6 kg (175 lb 7.8 oz) 78.3 kg (172 lb 9.9 oz) 79.9 kg (176 lb 3.2 oz)    History of present illness:  83 y.o.malewith medical history significant for medically managed coronary artery disease, chronic combined systolic/diastolic CHF, hypertension, and chronic kidney disease stage IV-V was admitted with dyspnea/vol overload  Hospital Course:   Chronic kidney disease stage V/ New ESRD -Started peritoneal dialysis this admission and then switched over to hemodialysis per renal due to decreased clearance. -palliative consulted, no plan for long term hemodialysis, pt didn't want to continue HD -last HD yesterday, HD catheter removed, [facility]  [facility]    NSTEMI (non-ST elevated myocardial infarction) - Cardiology consulted - pt was on heparin gtt but had to be held due to acute blood loss anemia , possibly via GI tract and rectus sheath hematoma - No plans for cardiac catheterization and no escalation of care, plan for hospice/comfort focused care  New onset PAF - pt on metoprolol and amiodarone -continues to be in A. Fib off and on, heart rate better, changed amiodarone to oral -No plans to restart  anticoagulation given overall decline and plan for comfort focused care  CVA - unclear whether this occurred prior to admission given reports of altered mental status prior to patient being admitted. - unable to have MRI due to metal - Off anticoagulation due to suspected recent GI bleed and rectus sheath hematoma -Comfort focused care  Anemia/ suspect GI blood loss/rectus sheath hematoma - Pt has positive fecal occult blood no frank bleeding currently.  - Pt has worsening anemia and required transfusion.  - hgb improved after transfusion and stable now - anticoagulants held due to worsening anemia    Acute on chronic systolic and diastolic heart failure, NYHA class 4 (HCC) - Fluid status controlled with dialysis - pt on metoprolol, stopped HD now    Leukocytosis/suspected pna. - Pt is currently on Zosyn. Concern was that initial chest x-ray reported finding suspicious for pneumonia.  -completed 7days, stopped Abx    Hyperglycemia - Hgb a1c is 6.0  Discharge Exam: Vitals:   11/20/16 0744 11/20/16 0933  BP: (!) 120/51 (!) 123/56  Pulse:  83  Resp:    Temp: 97.7 F (36.5 C)   SpO2: 95%     General: alert, awake, frail Cardiovascular: S1S2/RRR Respiratory: decreased BS at bases  Discharge Instructions   Discharge Instructions    Discharge instructions    Complete by:  As directed    Comfort feeds   Increase activity slowly    Complete by:  As directed      Current Discharge Medication List    START taking these medications   Details  amiodarone (PACERONE) 200 MG  tablet Take 1 tablet (200 mg total) by mouth 2 (two) times daily. Refills: 0    metoprolol succinate (TOPROL-XL) 25 MG 24 hr tablet Take 1 tablet (25 mg total) by mouth daily.    morphine 20 MG/5ML solution Take 1.3 mLs (5.2 mg total) by mouth every 4 (four) hours as needed for pain. Qty: 10 mL, Refills: 0    pantoprazole (PROTONIX) 40 MG tablet Take 1 tablet (40 mg total) by mouth daily.     traZODone (DESYREL) 50 MG tablet Take 0.5 tablets (25 mg total) by mouth at bedtime.      CONTINUE these medications which have NOT CHANGED   Details  albuterol (PROVENTIL) (2.5 MG/3ML) 0.083% nebulizer solution INHALE EVERY 4-6HRS AS NEEDED FOR WHEEZING/SHORTNESS OF BREATH Refills: 2    allopurinol (ZYLOPRIM) 100 MG tablet Take one half tablet by mouth once daily. ADDITIONAL REFILLS FROM PCP Qty: 30 tablet, Refills: 1    nitroGLYCERIN (NITROSTAT) 0.4 MG SL tablet Place 1 tablet (0.4 mg total) under the tongue every 5 (five) minutes as needed for chest pain (chest pain). Qty: 25 tablet, Refills: 11    Propylene Glycol-Glycerin (SOOTHE OP) Place 1 drop into both eyes as needed (for dry eyes).    triamcinolone ointment (KENALOG) 0.1 % Apply 1 application topically daily as needed (for itchy skin).       STOP taking these medications     aspirin 81 MG tablet      clopidogrel (PLAVIX) 75 MG tablet      furosemide (LASIX) 80 MG tablet      metoprolol tartrate (LOPRESSOR) 25 MG tablet      traMADol (ULTRAM) 50 MG tablet        Allergies  Allergen Reactions  . Gentamycin [Gentamicin] Other (See Comments)    Decreased kidney fx  . Crestor [Rosuvastatin Calcium] Other (See Comments)    MUSCLE PAIN  . Hctz [Hydrochlorothiazide] Other (See Comments)    dizziness  . Lipitor [Atorvastatin Calcium] Other (See Comments)    MUSCLE PAIN      The results of significant diagnostics from this hospitalization (including imaging, microbiology, ancillary and laboratory) are listed below for reference.    Significant Diagnostic Studies: Ct Abdomen Pelvis Wo Contrast  Result Date: 11/17/2016 CLINICAL DATA:  83 year old male with abdominal pain and tenderness. Concern for hematoma. EXAM: CT ABDOMEN AND PELVIS WITHOUT CONTRAST TECHNIQUE: Multidetector CT imaging of the abdomen and pelvis was performed following the standard protocol without IV contrast. COMPARISON:  Renal ultrasound dated  04/19/2014 FINDINGS: Evaluation of this exam is limited in the absence of intravenous contrast. Evaluation is also limited due to streak artifact caused by metallic right hip arthroplasty as well as posterior spinal fixation screws. Evaluation is also limited due to motion artifact. Lower chest: Probable small bilateral pleural effusions with associated partial consolidative changes of the lung bases which may represent compressive atelectasis versus pneumonia. Clinical correlation is recommended. There is mild cardiomegaly. Multi vessel coronary vascular calcification noted. There is hypoattenuation of the cardiac blood pool suggestive of a degree of anemia. Clinical correlation is recommended. There is no intra-abdominal free air. A small pocket of intraperitoneal air in the anterior abdomen (series 3, image 28) noted. Hepatobiliary: The liver and gallbladder appear grossly unremarkable on this noncontrast CT. No intrahepatic biliary ductal dilatation. Pancreas: The pancreas is atrophic otherwise unremarkable. Spleen: Normal in size without focal abnormality. Adrenals/Urinary Tract: The adrenal glands are grossly unremarkable. Bilateral renal atrophy. Multiple bilateral renal lesions measuring up to 2  cm on the left are not well characterized on the CT. There is no hydronephrosis or nephrolithiasis on either side. The visualized ureters appear unremarkable. The urinary bladder is collapsed and not well visualized. Stomach/Bowel: There is extensive sigmoid diverticulosis with muscular hypertrophy. Mild stranding of the right hemipelvis adjacent to the sigmoid likely related to inflammatory changes of the anterior abdominal wall and less likely represent diverticulitis. Evaluation of this area is very limited due to streak artifact. Correlation with clinical exam is recommended. There is no bowel obstruction. Normal appendix. Vascular/Lymphatic: There is advanced atherosclerotic calcification of the aorta and iliac  vessels as well as atherosclerotic calcification of the mesenteric vasculature. The IVC is grossly unremarkable. No portal venous gas identified. There is no adenopathy. Reproductive: The prostate and seminal vesicles are not well visualized. A Foley catheter is seen with balloon likely at the base of the bladder. Other: A percutaneous tube enters the left anterior abdominal wall. The tube enters the upper peritoneal cavity and appears to penetrate the distal stomach and traverse the anterior wall of the antrum. The tube re- enters the right anterior abdominal wall and takes a caudad coarse in the subcutaneous soft tissues anterior to the right rectus muscle. The tube enters the distal aspect of the right rectus muscle and 3 enters into the peritoneal cavity with coiled tip located in the pelvis. There is a 10 x 5 x 11 cm right rectus sheath hematoma superior to the level of the umbilicus. Musculoskeletal: There is osteopenia with extensive degenerative changes of the spine. Lower thoracic and lumbosacral posterior fusion hardware noted. There is a total right hip arthroplasty. There is chronic deformity of the right hip with areas of cystic formation surrounding the arthroplasty which may represent loosening or related to particle disease. An infectious process is not excluded. There is remodeling of the medial wall of the iliac bone with protrusion of the hardware. There is extension of the uppermost screw of the acetabular cup through the iliac bone and into the right iliacus muscle. IMPRESSION: 1. Peritoneal dialysis catheter enters the left upper anterior abdominal wall and appears to penetrate into the lumen of the distal stomach. The catheter subsequently exits the wall of the stomach and re-enters the right anterior abdominal wall taking a caudad coarse along the right rectus abdominus muscle. The catheter then traverses the inferior right rectus abdominis and enters the peritoneal cavity within the pelvis  with tip coiled in the pelvic floor. 2. Small pockets of extraluminal air noted in the anterior upper abdomen adjacent to the gastric antrum where the catheter enters the stomach. 3. Large right rectus sheath hematoma. 4. Right hip arthroplasty with chronic deformity of the right hip and findings suggestive of arthroplasty loosening or particle disease. There is remodeling of the medial wall of the right acetabulum with protrusion of the arthroplasty. The uppermost screw of the acetabular cup component of the arthroplasty penetrates through the bone into the right iliacus musculature. 5. Other nonemergent findings including extensive diverticulosis, atherosclerosis, and extensive degenerative changes of the spine. These results were called by telephone at the time of interpretation on 11/17/2016 at 9:44 pm to Dr. Oletta Darter, who verbally acknowledged these results. Electronically Signed   By: Anner Crete M.D.   On: 11/17/2016 22:31   Dg Chest 2 View  Result Date: 11/11/2016 CLINICAL DATA:  83 year old male with chest pain.  History of MI. EXAM: CHEST  2 VIEW COMPARISON:  Chest radiograph dated 10/31/2016 FINDINGS: Cardiac monitor leads overlie the patient.  There is stable cardiomegaly. There is vascular congestion, new or worsened compared to the prior radiograph. Superimposed pneumonia is not excluded. Clinical correlation is recommended. No large pleural effusion. There is no pneumothorax. A small stent noted over the right hemidiaphragm as seen on the prior radiograph. There is osteopenia with extensive degenerative changes of the spine. There are degenerative changes of the shoulder with flattening of the right humeral head. Cervical anterior fusion plate and screw as well as lower thoracic and lumbar posterior fixation hardware noted. IMPRESSION: 1. Cardiomegaly with vascular congestion. Pneumonia is not excluded. Clinical correlation is recommended. 2. Extensive degenerative changes of the spine and  shoulders as well as lower thoracic and lumbar posterior fixation hardware. Electronically Signed   By: Anner Crete M.D.   On: 11/11/2016 22:59   Dg Chest 2 View  Result Date: 10/31/2016 CLINICAL DATA:  Shortness of breath on exertion. Chest tightness. Insertion of peritoneal dialysis catheter yesterday. EXAM: CHEST  2 VIEW COMPARISON:  08/27/2015 FINDINGS: Surgical changes in the lower cervical spine and in the thoracolumbar spine. The lungs are clear. Heart size is upper limits of normal but stable. Extensive degenerative disease in both shoulders. Atherosclerotic calcifications at the aortic arch. No large pleural effusions. Small amount of lucency underneath the right hemidiaphragm. Findings compatible with intraperitoneal air from recent catheter placement. IMPRESSION: No active cardiopulmonary disease. Small amount of free intraperitoneal air. Findings compatible with recent peritoneal catheter insertion. Electronically Signed   By: Markus Daft M.D.   On: 10/31/2016 13:12   Ct Head Wo Contrast  Result Date: 11/17/2016 CLINICAL DATA:  83 y/o  M; altered mental status. EXAM: CT HEAD WITHOUT CONTRAST TECHNIQUE: Contiguous axial images were obtained from the base of the skull through the vertex without intravenous contrast. COMPARISON:  10/31/2016 CT of the head. FINDINGS: Brain: Interval development of lucency within the right superior putamen extending into mid corona radiata compatible with late acute or subacute infarction. No evidence for large vascular territory acute infarction, acute intracranial hemorrhage, or focal mass effect. No hydrocephalus or extra-axial collection. Stable chronic microvascular ischemic changes and parenchymal volume loss of the brain. Vascular: Extensive calcific atherosclerosis of carotid siphons. Skull: No acute osseous abnormality identified. Sinuses/Orbits: No acute finding. Other: Right temporalis region postsurgical changes are stable. IMPRESSION: 1. Interval  small hypodensity within the right superior putamen extending into mid corona radiata, likely late acute or subacute infarction. 2. No acute hemorrhage or focal mass effect. 3. Stable chronic microvascular ischemic changes and parenchymal volume loss of the brain. These results will be called to the ordering clinician or representative by the Radiologist Assistant, and communication documented in the PACS or zVision Dashboard. Electronically Signed   By: Kristine Garbe M.D.   On: 11/17/2016 20:47   Ct Head Wo Contrast  Result Date: 10/31/2016 CLINICAL DATA:  Diplopia EXAM: CT HEAD WITHOUT CONTRAST TECHNIQUE: Contiguous axial images were obtained from the base of the skull through the vertex without intravenous contrast. COMPARISON:  None. FINDINGS: Brain: No mass lesion, intraparenchymal hemorrhage or extra-axial collection. No evidence of acute cortical infarct. There is periventricular hypoattenuation compatible with chronic microvascular disease. Vascular: No hyperdense vessel or unexpected calcification. Skull: Suspected iatrogenic graft material in the expected location of the right temporalis muscle. No skull fracture. Sinuses/Orbits: No sinus fluid levels or advanced mucosal thickening. No mastoid effusion. Normal orbits. IMPRESSION: No acute intracranial abnormality. Findings of chronic microvascular disease. Electronically Signed   By: Ulyses Jarred M.D.   On: 10/31/2016 19:15  Dg Chest Port 1 View  Result Date: 11/17/2016 CLINICAL DATA:  Hypertension.  CHF. EXAM: PORTABLE CHEST 1 VIEW COMPARISON:  11/15/2016 FINDINGS: 1729 hours. Low lung volumes. The cardio pericardial silhouette is enlarged. The right infrahilar lung opacity persists and is stable. Retrocardiac collapse/ consolidation again noted. Probable tiny bilateral pleural effusions. Left subclavian central line tip overlies the proximal SVC. Thoracolumbar fusion hardware noted. Telemetry leads overlie the chest. Degenerative  changes evident in both shoulders. IMPRESSION: 1. Interval progression of retrocardiac left base collapse/consolidation. 2. Tiny bilateral pleural effusions. 3. Stable hazy infrahilar opacity at the right lung base. Continued follow-up recommended to ensure resolution. Electronically Signed   By: Misty Stanley M.D.   On: 11/17/2016 19:39   Dg Chest Port 1 View  Result Date: 11/15/2016 CLINICAL DATA:  Central line placement. EXAM: PORTABLE CHEST 1 VIEW COMPARISON:  11/15/2016 FINDINGS: Interval placement of left subclavian central venous catheter with tip over the SVC. Lungs are hypoinflated with mild hazy opacification right base unchanged likely mild residual vascular congestion. No pneumothorax. Mild stable cardiomegaly. Calcified plaque over the aortic arch. Hardware intact over the cervical spine and thoracolumbar spine. Remainder of the exam is unchanged. IMPRESSION: Hypoinflation with stable hazy right basilar opacification likely mild residual vascular congestion, although cannot exclude atelectasis or infection. Left subclavian central venous catheter with tip over the SVC. No pneumothorax. Electronically Signed   By: Marin Olp M.D.   On: 11/15/2016 21:13   Dg Chest Port 1 View  Result Date: 11/15/2016 CLINICAL DATA:  Acute respiratory failure EXAM: PORTABLE CHEST 1 VIEW COMPARISON:  11/15/2016 FINDINGS: Post- operative changes of the cervical spine. Low lung volumes. Hazy bibasilar atelectasis or infiltrates. Cardiomegaly with central vascular congestion. Aortic atherosclerosis. No pneumothorax. IMPRESSION: 1. Cardiomegaly with mild central vascular congestion 2. Hazy bibasilar atelectasis or small infiltrates. Cannot exclude small pleural effusions. Electronically Signed   By: Donavan Foil M.D.   On: 11/15/2016 18:59   Dg Chest Port 1 View  Result Date: 11/15/2016 CLINICAL DATA:  Kidney EXAM: PORTABLE CHEST 1 VIEW COMPARISON:  Chest x-ray dated 11/13/2016 and chest x-ray dated  10/31/2016. FINDINGS: Cardiomegaly is stable. Atherosclerotic changes noted at the aortic arch. Lungs are clear, questionable mild atelectasis at each lung base. No pleural effusion or pneumothorax seen. No acute or suspicious osseous finding. Fixation hardware again noted within the cervical and thoracic spine. Extensive degenerative changes again noted at each shoulder. IMPRESSION: No active disease.  No evidence of pneumonia or pulmonary edema. Stable cardiomegaly. Aortic atherosclerosis. Electronically Signed   By: Franki Cabot M.D.   On: 11/15/2016 09:49   Dg Chest Port 1 View  Result Date: 11/13/2016 CLINICAL DATA:  Acute onset of shortness of breath. Initial encounter. EXAM: PORTABLE CHEST 1 VIEW COMPARISON:  Chest radiograph performed 11/11/2016 FINDINGS: The lungs are well-aerated. Vascular congestion is noted. Right perihilar and bibasilar airspace opacities raise concern for pulmonary edema. Small bilateral pleural effusions are noted. There is no evidence of pneumothorax. The cardiomediastinal silhouette is borderline normal in size. No acute osseous abnormalities are seen. Cervical and thoracolumbar spinal fusion hardware noted. Degenerative change is noted at both glenohumeral joints. IMPRESSION: Vascular congestion. Right perihilar and bibasilar airspace opacities raise concern for pulmonary edema. Small bilateral pleural effusions noted. Electronically Signed   By: Garald Balding M.D.   On: 11/13/2016 01:36   Dg Swallowing Func-speech Pathology  Result Date: 11/18/2016 Objective Swallowing Evaluation: Type of Study: MBS-Modified Barium Swallow Study Patient Details Name: HARBOR VANOVER MRN: [id number]  Date of Birth: [birth year] Today's Date: 11/18/2016 Time: SLP Start Time (ACUTE ONLY): 1321-SLP Stop Time (ACUTE ONLY): 1338 SLP Time Calculation (min) (ACUTE ONLY): 17 min Past Medical History: Past Medical History: Diagnosis Date . Acute respiratory failure with hypoxia ([place]) 03/03/2013 .  Asthma  . CAD (coronary artery disease)- MI in '09 02/28/2013 . Cancer ([place])   lymphoma  1979   . CHF (congestive heart failure) ([place])   2015 . Chronic renal insufficiency   creatinine ranges around 2.8 . Complication of anesthesia   No anesthesia complications, but concerns about neck positioning due to cervical disease . DOE (dyspnea on exertion)  . Dyspnea   upon exertion . Gout  . HCAP (healthcare-associated pneumonia) 03/03/2013 . Herpes zoster 12/2009  left eye ..........last flare up "a long time" . History of BPH  . HOH (hard of hearing)   hears well out of left ear . Hypertension  . IHD (ischemic heart disease)   Remote MI in 2009 with late presentation; no reperfusion. Has large area of infarct in the apical anterior area on last nuclear in 2011. Managed medically . Influenza A 03/03/2013 . MI, old 03/2007  ACUTE ANTEROSEPTAL . NSTEMI (non-ST elevated myocardial infarction)- type 2 02/28/2013 . OA (osteoarthritis) of knee   Left knee with revision and replacement of prosthetic L TKR . Peripheral edema  . RBBB (right bundle branch block)  . Renal insufficiency  . Staph infection   states that gentamycin & vancomycin, (30 - 45 days worth) are what did his kidneys in, per his daughter Past Surgical History: Past Surgical History: Procedure Laterality Date . BACK SURGERY    has had 3 surgeries on his back . BILATERAL CARPAL TUNNEL RELEASE   . BUNIONECTOMY   . CAPD INSERTION N/A 10/30/2016  Procedure: LAPAROSCOPIC INSERTION CONTINUOUS AMBULATORY PERITONEAL DIALYSIS  (CAPD) CATHETER   OMENTOPEXY;  Surgeon: Michael Boston, MD;  Location: [place];  Service: General;  Laterality: N/A; . CARDIAC CATHETERIZATION  03/24/2007 . CARDIOVASCULAR STRESS TEST  04/02/2009  EF 27% AND A LARGE AREA OF INFARCT IN THE APICAL ANTERIOR WITH MILD PERI-INFARCT ISCHEMIA . FRACTURE SURGERY    both ankles . INSERTION OF DIALYSIS CATHETER  10/30/2016  LAPAROSCOPIC INSERTION CONTINUOUS AMBULATORY PERITONEAL DIALYSIS  (CAPD) CATHETER   OMENTOPEXY .  JOINT REPLACEMENT    3 on right hip, 2 on knee . KNEE SURGERY  1998  left knee . neck surgeries (x2)   . TOE AMPUTATION    LEFT FOOT . TOTAL KNEE ARTHROPLASTY    left knee . TRANSTHORACIC ECHOCARDIOGRAM  04/04/2009  EF 45-50% . TRANSURETHRAL RESECTION OF PROSTATE   HPI: 83 y.o.malewith medical history significant for medically managed coronary artery disease, chronic combined systolic/diastolic CHF, hypertension, and chronic kidney disease stage IV-V, recent neck surgery, who presented to the emergency department for evaluation of chest discomfort and dyspnea. Admitted for NSTEMI - type 2, with possible pneumonia. Initial CXR concerning for PNA, WBC 15.9. Subjective: Pt in bed, family present Assessment / Plan / Recommendation CHL IP CLINICAL IMPRESSIONS 11/18/2016 Clinical Impression Mild oral impairments including prolonged mastication and transit with regular texture, premature spill with nectar due to decreased control. Swallow initiated at the level of the pyriform sinuses due to sensory deficits and motor impairments led to decreased epiglottic deflection and closure resulting in sensed penetration with thin (may have fallen below vocal cords- difficult with shoulders obstructing view). Mild-moderate vallecular residue given reduced epiglottic inversion. Stasis observed in distal portion of esophagus. Pt at risk for aspiration  given suspected prior dysphagia with what appears to be cervical osteophyte, deconditioning and recent neck surgery. Recommend Dys 3 texture, nectar thick liquids, pills whole in appleauce, swallow twice and continued ST. SLP Visit Diagnosis Dysphagia, oropharyngeal phase (R13.12) Attention and concentration deficit following -- Frontal lobe and executive function deficit following -- Impact on safety and function (No Data)   CHL IP TREATMENT RECOMMENDATION 11/18/2016 Treatment Recommendations Therapy as outlined in treatment plan below   Prognosis 11/18/2016 Prognosis for Safe Diet  Advancement (No Data) Barriers to Reach Goals -- Barriers/Prognosis Comment -- CHL IP DIET RECOMMENDATION 11/18/2016 SLP Diet Recommendations Dysphagia 3 (Mech soft) solids;Nectar thick liquid Liquid Administration via Cup;Straw Medication Administration Whole meds with puree Compensations Slow rate;Small sips/bites;Multiple dry swallows after each bite/sip Postural Changes Seated upright at 90 degrees   CHL IP OTHER RECOMMENDATIONS 11/18/2016 Recommended Consults -- Oral Care Recommendations Oral care BID Other Recommendations --   CHL IP FOLLOW UP RECOMMENDATIONS 11/18/2016 Follow up Recommendations (No Data)   CHL IP FREQUENCY AND DURATION 11/18/2016 Speech Therapy Frequency (ACUTE ONLY) min 2x/week Treatment Duration 2 weeks      CHL IP ORAL PHASE 11/18/2016 Oral Phase Impaired Oral - Pudding Teaspoon -- Oral - Pudding Cup -- Oral - Honey Teaspoon -- Oral - Honey Cup -- Oral - Nectar Teaspoon -- Oral - Nectar Cup Delayed oral transit;Premature spillage Oral - Nectar Straw Lingual/palatal residue Oral - Thin Teaspoon -- Oral - Thin Cup WFL Oral - Thin Straw -- Oral - Puree -- Oral - Mech Soft -- Oral - Regular Delayed oral transit Oral - Multi-Consistency -- Oral - Pill -- Oral Phase - Comment --  CHL IP PHARYNGEAL PHASE 11/18/2016 Pharyngeal Phase Impaired Pharyngeal- Pudding Teaspoon -- Pharyngeal -- Pharyngeal- Pudding Cup -- Pharyngeal -- Pharyngeal- Honey Teaspoon -- Pharyngeal -- Pharyngeal- Honey Cup -- Pharyngeal -- Pharyngeal- Nectar Teaspoon -- Pharyngeal -- Pharyngeal- Nectar Cup Penetration/Aspiration during swallow;Pharyngeal residue - valleculae;Reduced epiglottic inversion Pharyngeal Material enters airway, remains ABOVE vocal cords then ejected out Pharyngeal- Nectar Straw Delayed swallow initiation-pyriform sinuses Pharyngeal -- Pharyngeal- Thin Teaspoon -- Pharyngeal -- Pharyngeal- Thin Cup Penetration/Aspiration during swallow;Reduced epiglottic inversion;Reduced airway/laryngeal closure  Pharyngeal Material enters airway, CONTACTS cords and not ejected out Pharyngeal- Thin Straw -- Pharyngeal -- Pharyngeal- Puree -- Pharyngeal -- Pharyngeal- Mechanical Soft -- Pharyngeal -- Pharyngeal- Regular Pharyngeal residue - valleculae;Reduced epiglottic inversion Pharyngeal -- Pharyngeal- Multi-consistency -- Pharyngeal -- Pharyngeal- Pill -- Pharyngeal -- Pharyngeal Comment --  CHL IP CERVICAL ESOPHAGEAL PHASE 11/18/2016 Cervical Esophageal Phase WFL Pudding Teaspoon -- Pudding Cup -- Honey Teaspoon -- Honey Cup -- Nectar Teaspoon -- Nectar Cup -- Nectar Straw -- Thin Teaspoon -- Thin Cup -- Thin Straw -- Puree -- Mechanical Soft -- Regular -- Multi-consistency -- Pill -- Cervical Esophageal Comment -- No flowsheet data found. [place] Siren 11/18/2016, 4:29 PM Orbie Pyo Colvin [employer] [phone]               Microbiology: Recent Results (from the past 240 hour(s))  Culture, blood (routine x 2)     Status: None   Collection Time: 11/12/16  6:32 PM  Result Value Ref Range Status   Specimen Description BLOOD RIGHT HAND  Final   Special Requests   Final    BOTTLES DRAWN AEROBIC AND ANAEROBIC Blood Culture adequate volume   Culture NO GROWTH 5 DAYS  Final   Report Status 11/17/2016 FINAL  Final  Culture, blood (routine x 2)     Status: None  Collection Time: 11/12/16  6:42 PM  Result Value Ref Range Status   Specimen Description BLOOD LEFT HAND  Final   Special Requests   Final    BOTTLES DRAWN AEROBIC AND ANAEROBIC Blood Culture adequate volume   Culture NO GROWTH 5 DAYS  Final   Report Status 11/17/2016 FINAL  Final  MRSA PCR Screening     Status: None   Collection Time: 11/13/16 10:26 PM  Result Value Ref Range Status   MRSA by PCR NEGATIVE NEGATIVE Final    Comment:        The GeneXpert MRSA Assay (FDA approved for NASAL specimens only), is one component of a comprehensive MRSA colonization surveillance program. It is not intended to diagnose MRSA infection  nor to guide or monitor treatment for MRSA infections.   Stat Gram stain     Status: None   Collection Time: 11/14/16  2:00 AM  Result Value Ref Range Status   Specimen Description FLUID PERITONEAL  Final   Special Requests NONE  Final   Gram Stain   Final    FEW WBC PRESENT, PREDOMINANTLY MONONUCLEAR NO ORGANISMS SEEN    Report Status 11/14/2016 FINAL  Final  Aerobic Culture (superficial specimen)     Status: None   Collection Time: 11/14/16  2:00 AM  Result Value Ref Range Status   Specimen Description FLUID PERITONEAL  Final   Special Requests NONE  Final   Culture NO GROWTH 2 DAYS  Final   Report Status 11/16/2016 FINAL  Final  Culture, body fluid-bottle     Status: None   Collection Time: 11/14/16 11:41 AM  Result Value Ref Range Status   Specimen Description PERITONEAL  Final   Special Requests NONE  Final   Culture NO GROWTH 5 DAYS  Final   Report Status 11/19/2016 FINAL  Final  Gram stain     Status: None   Collection Time: 11/14/16 11:41 AM  Result Value Ref Range Status   Specimen Description PERITONEAL  Final   Special Requests NONE  Final   Gram Stain   Final    RARE WBC PRESENT, PREDOMINANTLY MONONUCLEAR NO ORGANISMS SEEN    Report Status 11/14/2016 FINAL  Final     Labs: Basic Metabolic Panel:  Recent Labs Lab 11/15/16 0947 11/16/16 1008 11/17/16 0410 11/18/16 0529 11/19/16 0658  NA 137 138 138 136 138  K 3.9 4.2 4.3 3.7 3.8  CL 97* 100* 98* 98* 98*  CO2 24 25 24 24 23   GLUCOSE 284* 113* 108* 102* 103*  BUN 102* 50* 70* 55* 73*  CREATININE 5.77* 3.64* 4.66* 4.20* 5.61*  CALCIUM 8.9 8.3* 8.7* 8.6* 8.9  PHOS 7.0* 5.6* 6.2* 5.6* 7.4*   Liver Function Tests:  Recent Labs Lab 11/14/16 1209 11/15/16 0947 11/16/16 1008 11/17/16 0410 11/18/16 0529 11/19/16 0658  AST 20  --   --   --   --   --   ALT 10*  --   --   --   --   --   ALKPHOS 90  --   --   --   --   --   BILITOT 0.9  --   --   --   --   --   PROT 6.1*  --   --   --   --   --    ALBUMIN 2.7* 2.4* 2.2* 2.3* 2.1* 2.0*   No results for input(s): LIPASE, AMYLASE in the last 168 hours.  Recent Labs Lab  11/17/16 1857  AMMONIA 26   CBC:  Recent Labs Lab 11/17/16 0410 11/17/16 2200 11/18/16 0529 11/18/16 1923 11/19/16 0658  WBC 15.2* 15.6* 15.5* 15.2* 14.2*  NEUTROABS  --  12.3*  --   --   --   HGB 7.1* 8.0* 8.1* 8.1* 8.2*  HCT 21.7* 24.5* 24.9* 24.7* 25.2*  MCV 97.3 96.1 97.3 96.5 97.3  PLT 307 310 304 321 335   Cardiac Enzymes:  Recent Labs Lab 11/17/16 1857  TROPONINI 0.73*   BNP: BNP (last 3 results)  Recent Labs  11/11/16 2239  BNP 2,178.5*    ProBNP (last 3 results) No results for input(s): PROBNP in the last 8760 hours.  CBG:  Recent Labs Lab 11/17/16 1116 11/17/16 1648 11/17/16 2131 11/18/16 0733 11/18/16 1146  GLUCAP 107* 127* 110* 107* 120*       Signed:  Sherelle Castelli MD.  [facility] 11/20/2016, 11:29 AM

## 2016-11-20 NOTE — Progress Notes (Signed)
Pt completed final dialysis treatment yesterday.    Nontunneled HD catheter removed last night.    To discharge to hospice.  We will sign off.    Please don't hesitate to contact us with any questions or concerns.    Madelon Lips MD [facility] pgr [phone]

## 2016-11-20 NOTE — Progress Notes (Signed)
  Speech Language Pathology Treatment: Dysphagia  Patient Details Name: [name] MRN: [id number] DOB: 16-Nov-1928 Today's Date: 11/20/2016 Time: 1121-1129 SLP Time Calculation (min) (ACUTE ONLY): 8 min  Assessment / Plan / Recommendation Clinical Impression  SLP following pt for dysphagia. He has made the decision to stop dialysis and is to transfer to hospice facility today. Pt presently sleeping with pt's daughter at bedside. Educated/reviewed treatment and discussed comfort feeds with her. She reports he likes and will consume the thickened apple juice. Offered to upgrade diet to regular texture, thin liquids but daughter reported he coughs "quite a bit with thin" and determined may not be comfortable for pt. Decision to keep Dys 3, nectar ordered and SLP will write order for ice chips and add pt may have sips thin liquid if desired. No further f/u at this time.        HPI: 83 [email or web address] medical history significant for medically managed coronary artery disease, chronic combined systolic/diastolic CHF, hypertension, and chronic kidney disease stage IV-V, recent neck surgery, who presented to the emergency department for evaluation of chest discomfort and dyspnea. Admitted for NSTEMI - type 2, with possible pneumonia. Initial CXR concerning for PNA, WBC 15.9.      SLP Plan  Discharge SLP treatment due to (comment) (discharge to hospice today)       Recommendations  Diet recommendations: Dysphagia 3 (mechanical soft);Nectar-thick liquid (thin liquids allowed if pt desires) Liquids provided via: Cup Medication Administration: Crushed with puree Supervision: Staff to assist with self feeding;Full supervision/cueing for compensatory strategies Compensations: Slow rate;Small sips/bites;Multiple dry swallows after each bite/sip Postural Changes and/or Swallow Maneuvers: Seated upright 90 degrees                Oral Care Recommendations: Oral care BID Follow up Recommendations:  Other (comment) (discharging to hospice facility today) SLP Visit Diagnosis: Dysphagia, oropharyngeal phase (R13.12) Plan: Discharge SLP treatment due to (comment) (discharge to hospice today)                      [place] Siren 11/20/2016, 11:37 AM   Orbie Pyo Colvin [employer] [phone]

## 2016-12-14 DIAGNOSIS — Z7409 Other reduced mobility: Secondary | ICD-10-CM | POA: Diagnosis not present

## 2016-12-14 DIAGNOSIS — M25559 Pain in unspecified hip: Secondary | ICD-10-CM | POA: Diagnosis not present

## 2016-12-14 DIAGNOSIS — T84498A Other mechanical complication of other internal orthopedic devices, implants and grafts, initial encounter: Secondary | ICD-10-CM | POA: Diagnosis not present

## 2016-12-14 DIAGNOSIS — C8510 Unspecified B-cell lymphoma, unspecified site: Secondary | ICD-10-CM | POA: Diagnosis not present

## 2016-12-14 DIAGNOSIS — Z96649 Presence of unspecified artificial hip joint: Secondary | ICD-10-CM | POA: Diagnosis not present

## 2016-12-14 DIAGNOSIS — N189 Chronic kidney disease, unspecified: Secondary | ICD-10-CM | POA: Diagnosis not present

## 2016-12-14 DIAGNOSIS — I5089 Other heart failure: Secondary | ICD-10-CM | POA: Diagnosis not present

## 2016-12-14 DIAGNOSIS — R296 Repeated falls: Secondary | ICD-10-CM | POA: Diagnosis not present

## 2016-12-14 DIAGNOSIS — N184 Chronic kidney disease, stage 4 (severe): Secondary | ICD-10-CM | POA: Diagnosis not present

## 2016-12-14 DIAGNOSIS — R269 Unspecified abnormalities of gait and mobility: Secondary | ICD-10-CM | POA: Diagnosis not present

## 2016-12-20 DEATH — deceased

## 2019-06-11 IMAGING — CT CT ABD-PELV W/O CM
2 of 4 series · 14 of 46 positions shown, 16 images · non-contrast
Comparison: Renal ultrasound dated 04/19/2014

CLINICAL DATA: 87-year-old male with abdominal pain and tenderness.
Concern for hematoma.

EXAM:
CT ABDOMEN AND PELVIS WITHOUT CONTRAST
TECHNIQUE: Multidetector CT imaging of the abdomen and pelvis was performed
following the standard protocol without IV contrast.

[Series 3: abd/ pelvis 5.0 i30f 2 · axial · 0.85mm/px · z∈[-740,-340]mm · 11 of 88 slices shown, 13 images]
[im 4/88  soft-tissue]
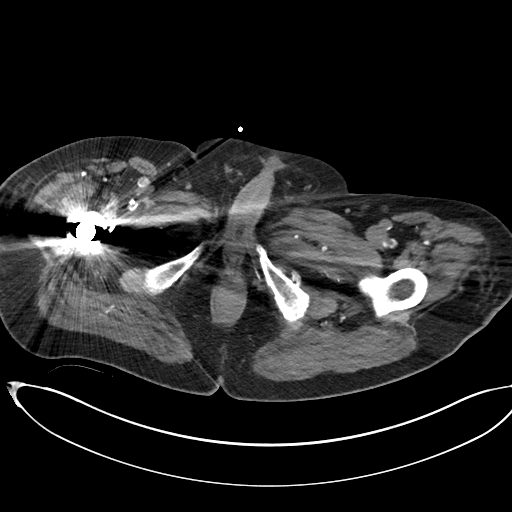
[im 4/88  bone]
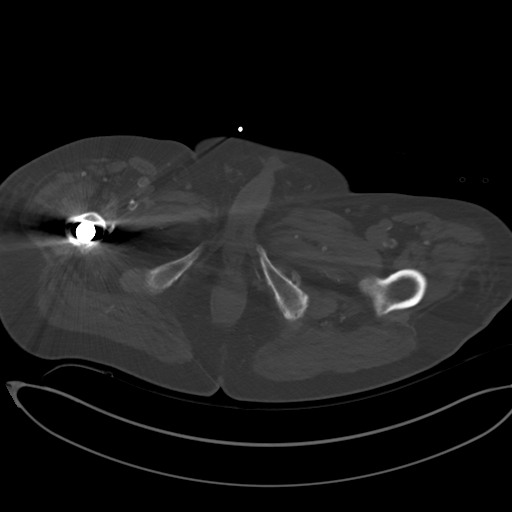
[im 12/88  soft-tissue]
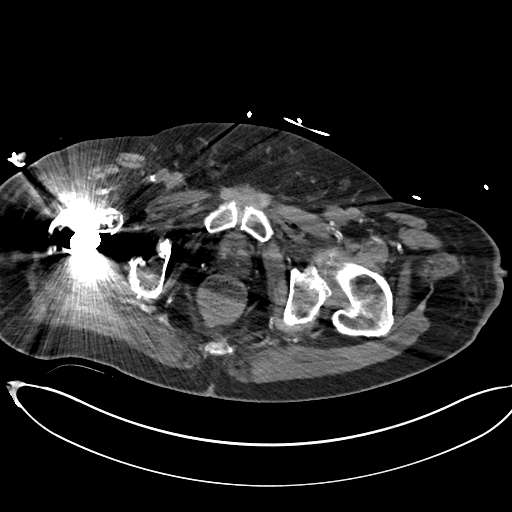
[im 20/88  soft-tissue]
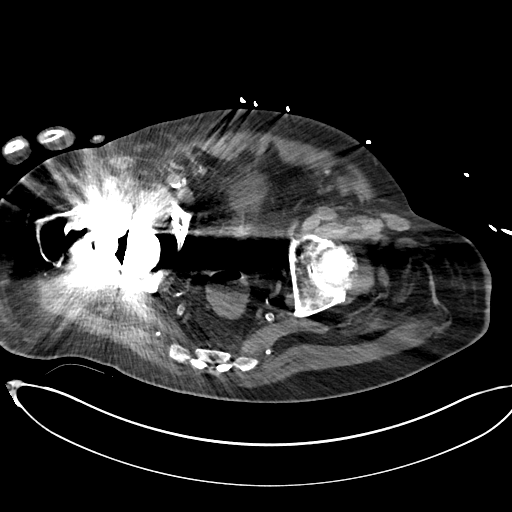
[im 28/88  soft-tissue]
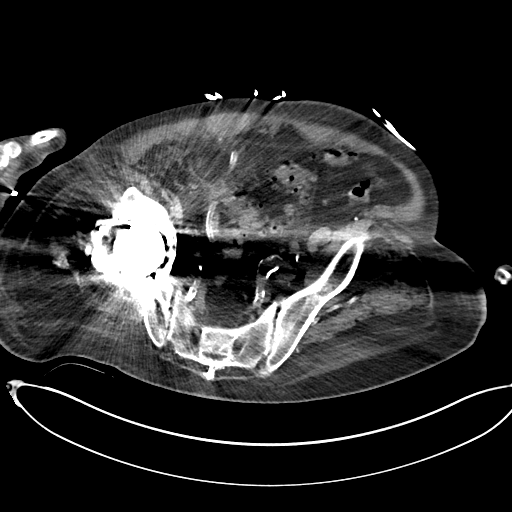
[im 36/88  soft-tissue]
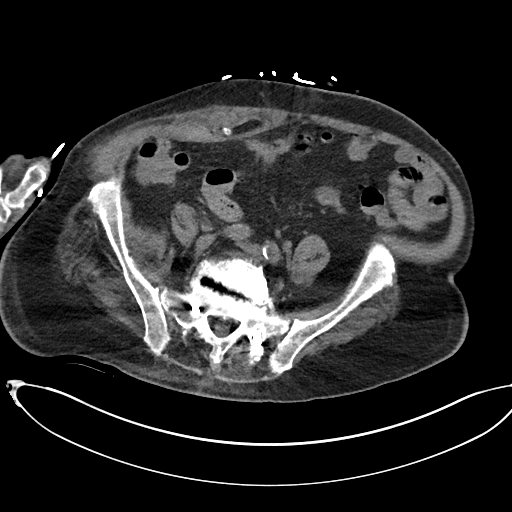
[im 44/88  soft-tissue]
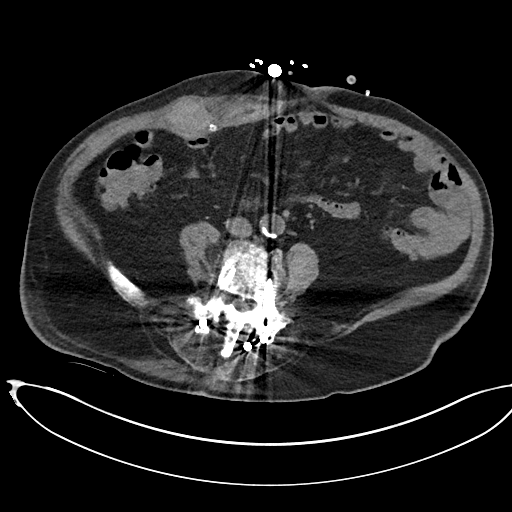
[im 52/88  soft-tissue]
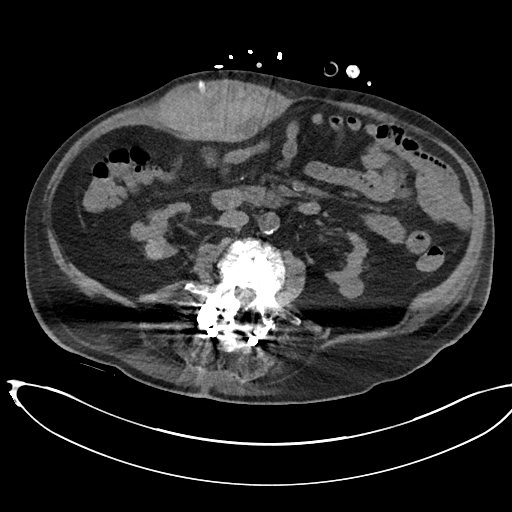
[im 60/88  soft-tissue]
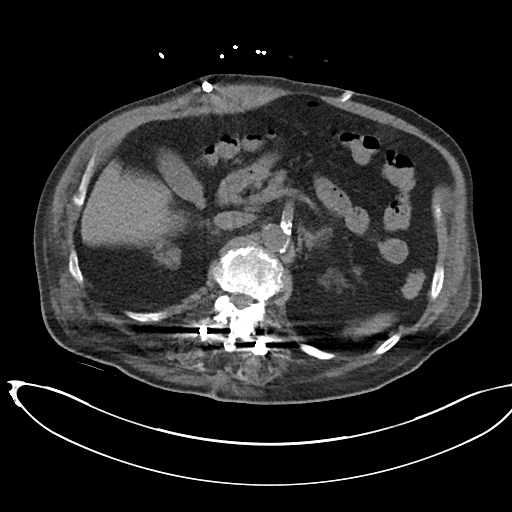
[im 68/88  soft-tissue]
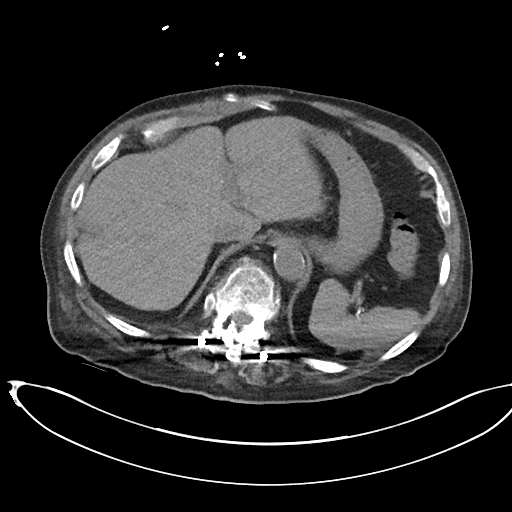
[im 68/88  bone]
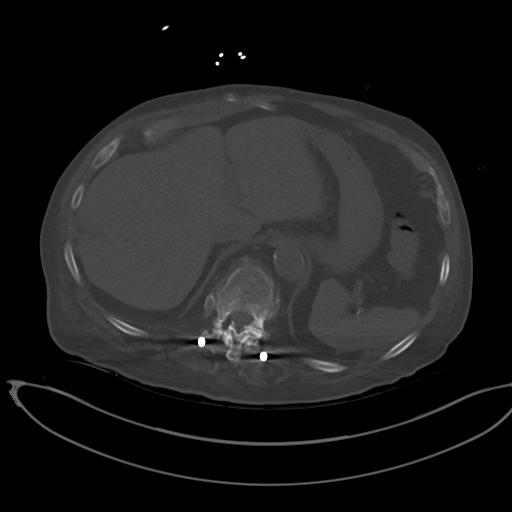
[im 76/88  soft-tissue]
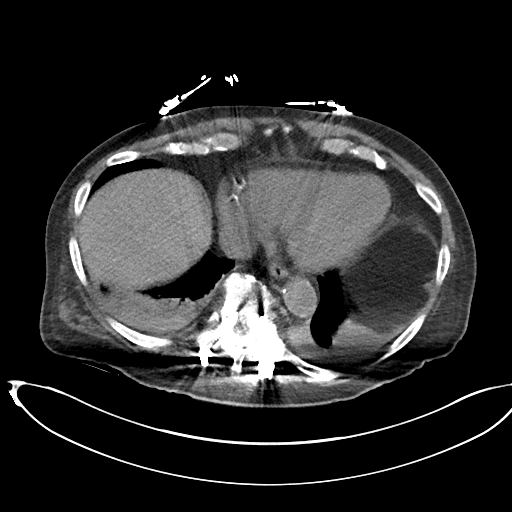
[im 84/88  soft-tissue]
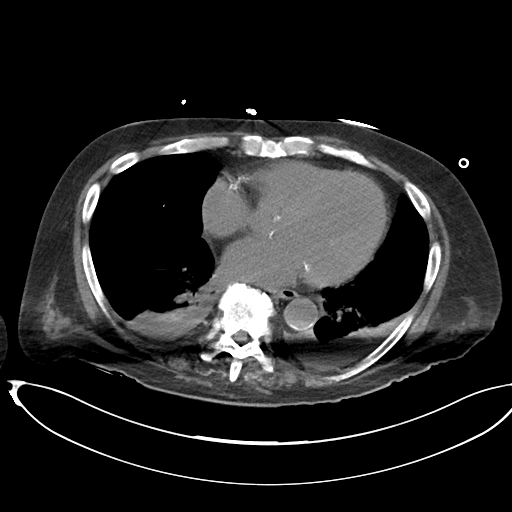

[Series 6: cor st · coronal · 0.81mm/px · 3 of 91 slices shown]
[im 31/91  soft-tissue]
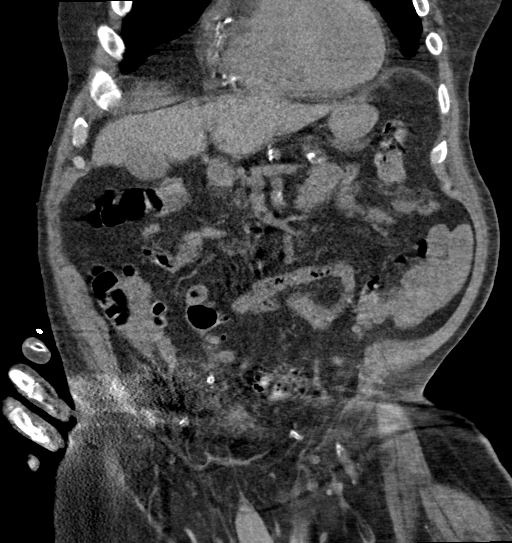
[im 41/91  soft-tissue]
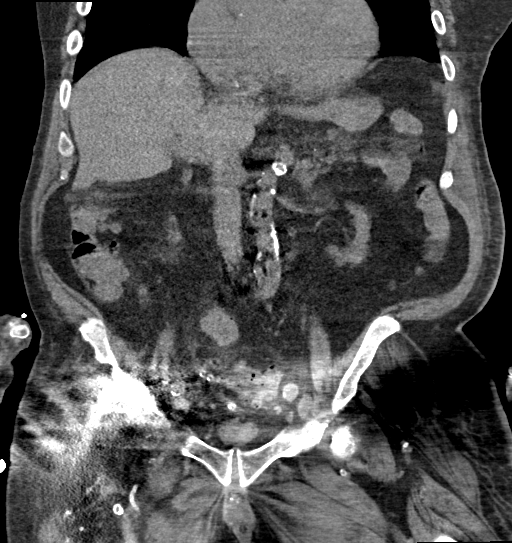
[im 51/91  soft-tissue]
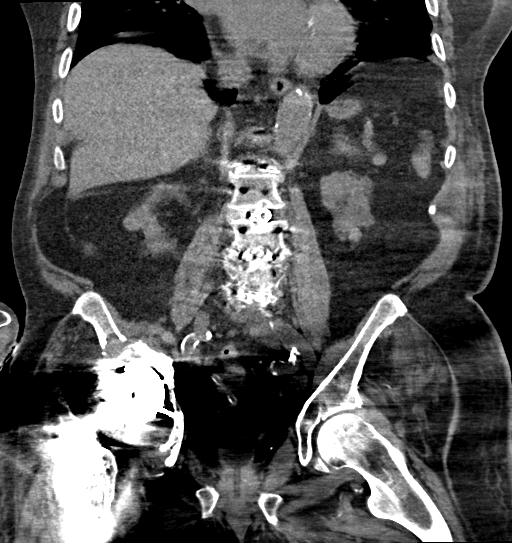

[14 of 46 positions shown; findings below may reference images not displayed]

FINDINGS: Evaluation of this exam is limited in the absence of intravenous
contrast. Evaluation is also limited due to streak artifact caused
by metallic right hip arthroplasty as well as posterior spinal
fixation screws. Evaluation is also limited due to motion artifact.

Lower chest: Probable small bilateral pleural effusions with
associated partial consolidative changes of the lung bases which may
represent compressive atelectasis versus pneumonia. Clinical
correlation is recommended. There is mild cardiomegaly. Multi vessel
coronary vascular calcification noted. There is hypoattenuation of
the cardiac blood pool suggestive of a degree of anemia. Clinical
correlation is recommended.

There is no intra-abdominal free air. A small pocket of
intraperitoneal air in the anterior abdomen (series 3, image 28)
noted.

Hepatobiliary: The liver and gallbladder appear grossly unremarkable
on this noncontrast CT. No intrahepatic biliary ductal dilatation.

Pancreas: The pancreas is atrophic otherwise unremarkable.

Spleen: Normal in size without focal abnormality.

Adrenals/Urinary Tract: The adrenal glands are grossly unremarkable.
Bilateral renal atrophy. Multiple bilateral renal lesions measuring
up to 2 cm on the left are not well characterized on the CT. There
is no hydronephrosis or nephrolithiasis on either side. The
visualized ureters appear unremarkable. The urinary bladder is
collapsed and not well visualized.

Stomach/Bowel: There is extensive sigmoid diverticulosis with
muscular hypertrophy. Mild stranding of the right hemipelvis
adjacent to the sigmoid likely related to inflammatory changes of
the anterior abdominal wall and less likely represent
diverticulitis. Evaluation of this area is very limited due to
streak artifact. Correlation with clinical exam is recommended.
There is no bowel obstruction. Normal appendix.

Vascular/Lymphatic: There is advanced atherosclerotic calcification
of the aorta and iliac vessels as well as atherosclerotic
calcification of the mesenteric vasculature. The IVC is grossly
unremarkable. No portal venous gas identified. There is no
adenopathy.

Reproductive: The prostate and seminal vesicles are not well
visualized. A Foley catheter is seen with balloon likely at the base
of the bladder.

Other: A percutaneous tube enters the left anterior abdominal wall.
The tube enters the upper peritoneal cavity and appears to penetrate
the distal stomach and traverse the anterior wall of the antrum. The
tube re- enters the right anterior abdominal wall and takes a caudad
coarse in the subcutaneous soft tissues anterior to the right rectus
muscle. The tube enters the distal aspect of the right rectus muscle
and 3 enters into the peritoneal cavity with coiled tip located in
the pelvis. There is a 10 x 5 x 11 cm right rectus sheath hematoma
superior to the level of the umbilicus.

Musculoskeletal: There is osteopenia with extensive degenerative
changes of the spine. Lower thoracic and lumbosacral posterior
fusion hardware noted. There is a total right hip arthroplasty.
There is chronic deformity of the right hip with areas of cystic
formation surrounding the arthroplasty which may represent loosening
or related to particle disease. An infectious process is not
excluded. There is remodeling of the medial wall of the iliac bone
with protrusion of the hardware. There is extension of the uppermost
screw of the acetabular cup through the iliac bone and into the
right iliacus muscle.
IMPRESSION: 1. Peritoneal dialysis catheter enters the left upper anterior
abdominal wall and appears to penetrate into the lumen of the distal
stomach. The catheter subsequently exits the wall of the stomach and
re-enters the right anterior abdominal wall taking a caudad coarse
along the right rectus abdominus muscle. The catheter then traverses
the inferior right rectus abdominis and enters the peritoneal cavity
within the pelvis with tip coiled in the pelvic floor.
2. Small pockets of extraluminal air noted in the anterior upper
abdomen adjacent to the gastric antrum where the catheter enters the
stomach.
3. Large right rectus sheath hematoma.
4. Right hip arthroplasty with chronic deformity of the right hip
and findings suggestive of arthroplasty loosening or particle
disease. There is remodeling of the medial wall of the right
acetabulum with protrusion of the arthroplasty. The uppermost screw
of the acetabular cup component of the arthroplasty penetrates
through the bone into the right iliacus musculature.
5. Other nonemergent findings including extensive diverticulosis,
atherosclerosis, and extensive degenerative changes of the spine.

These results were called by telephone at the time of interpretation
on 11/17/2016 at [DATE] to Dr. Reinke, who verbally acknowledged
these results.

## 2019-06-12 IMAGING — RF DG SWALLOWING FUNCTION - NRPT [facility]
1 series · 18 of 24 positions shown · non-contrast
Comparison: none

[Series 1: run · 18 acquisitions, 18 frames shown]
[im 1/18]
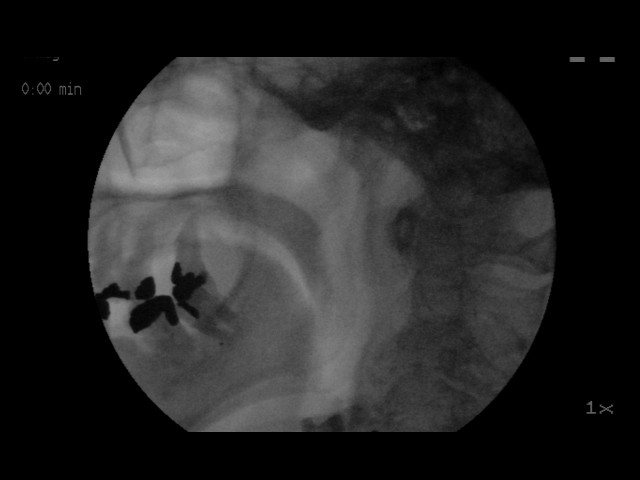
[im 2/18]
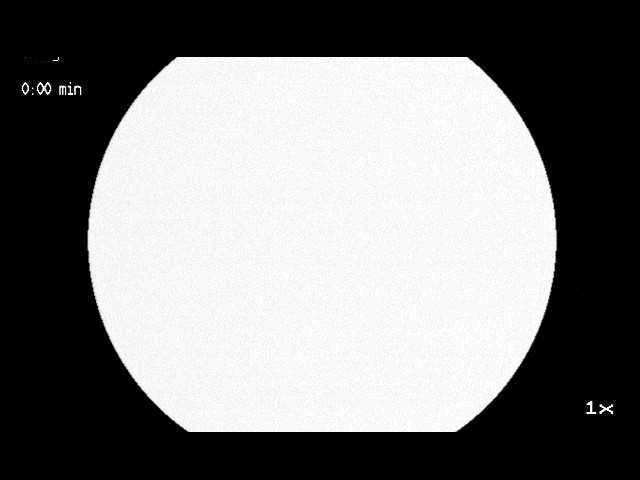
[im 3/18]
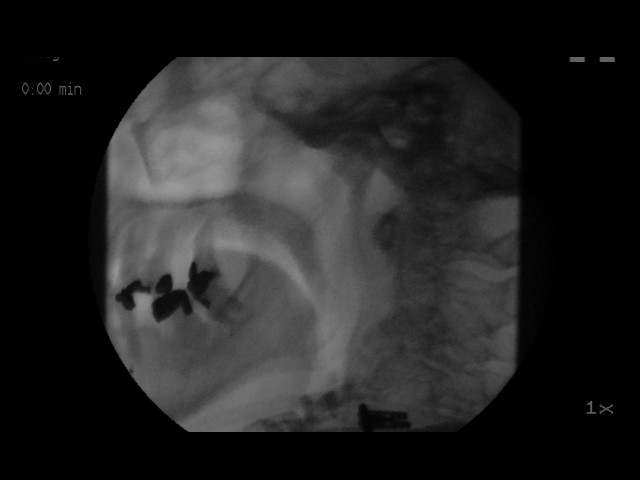
[im 4/18]
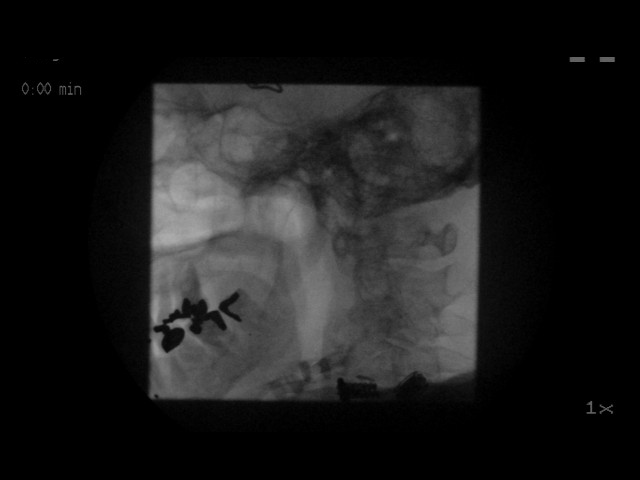
[im 5/18]
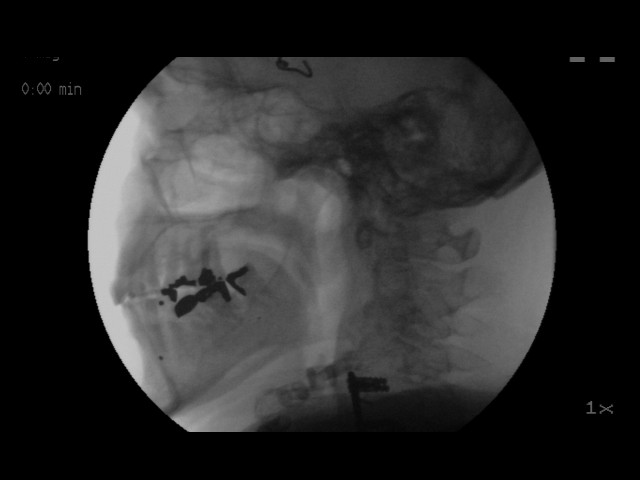
[im 6/18]
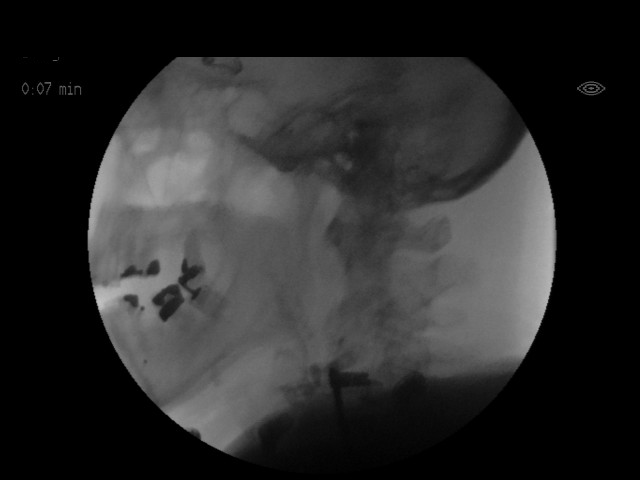
[im 7/18]
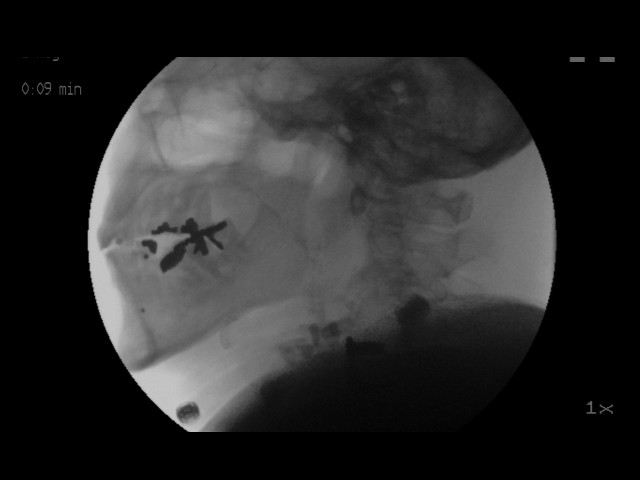
[im 8/18]
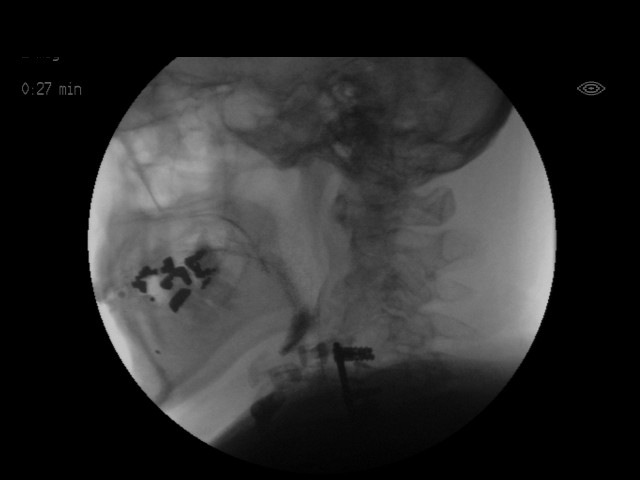
[im 9/18]
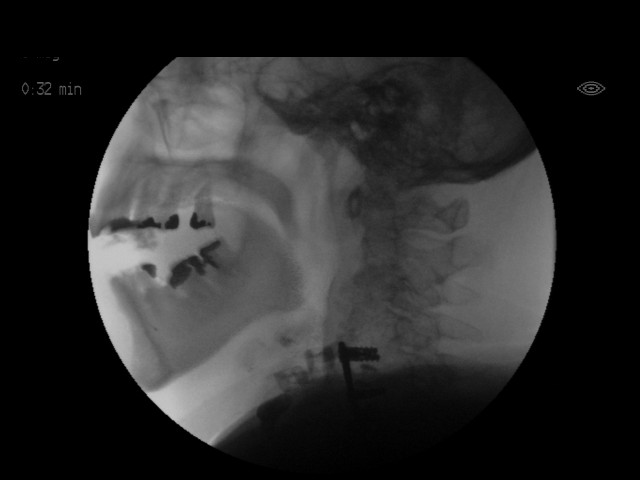
[im 10/18]
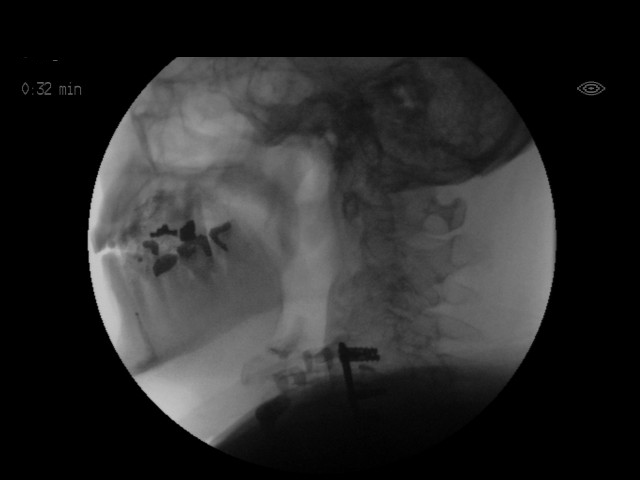
[im 11/18]
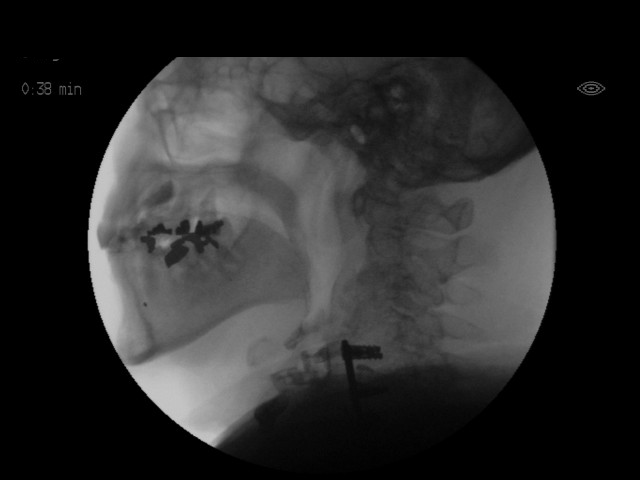
[im 12/18]
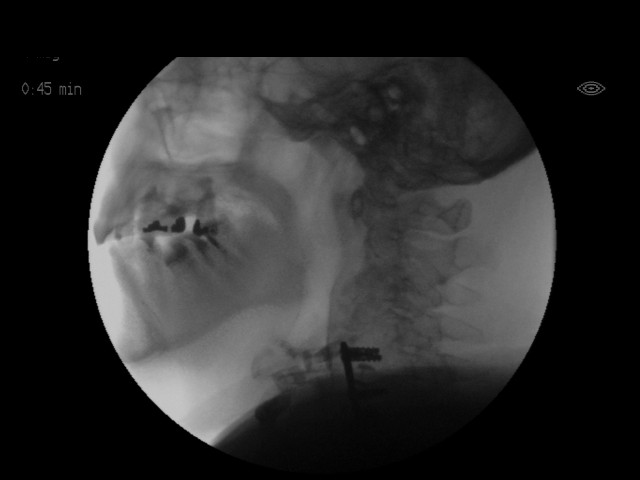
[im 13/18]
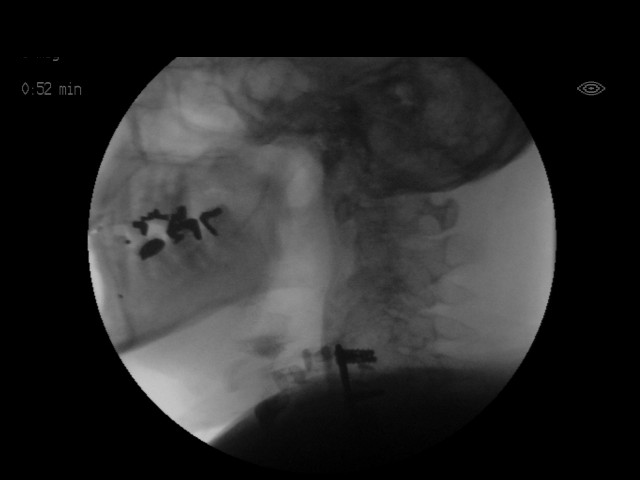
[im 15/18]
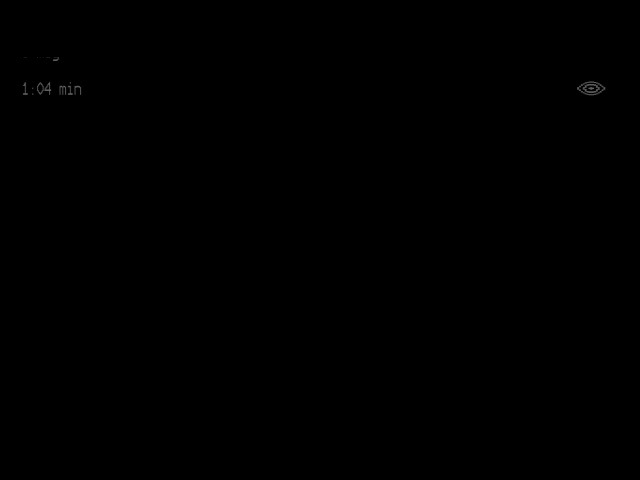
[im 15/18]
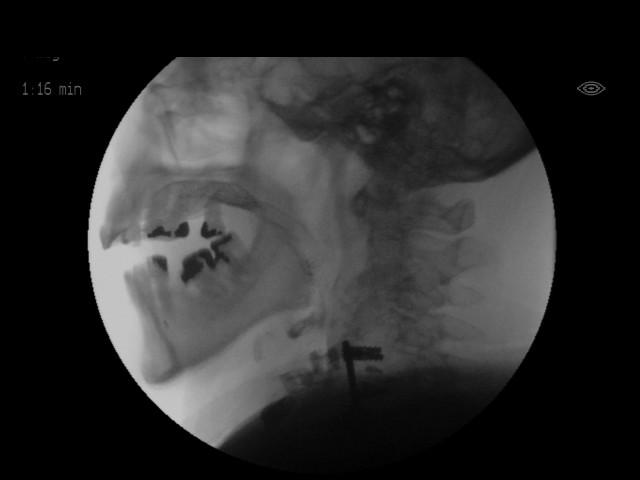
[im 16/18]
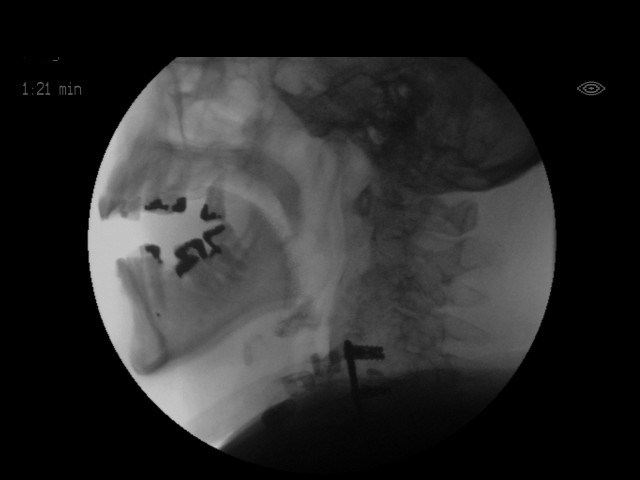
[im 18/18]
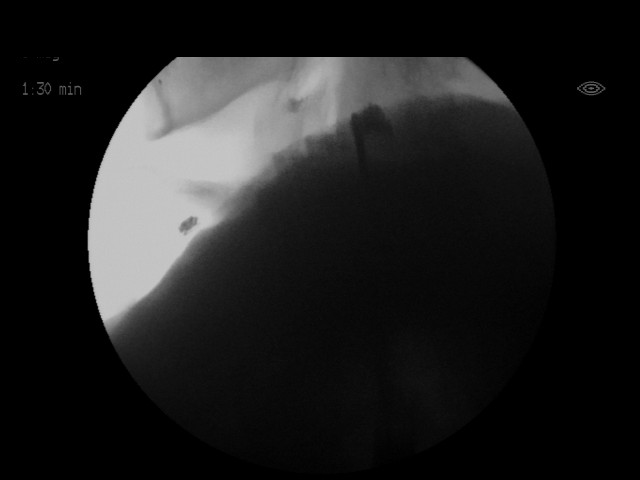
[im 18/18]
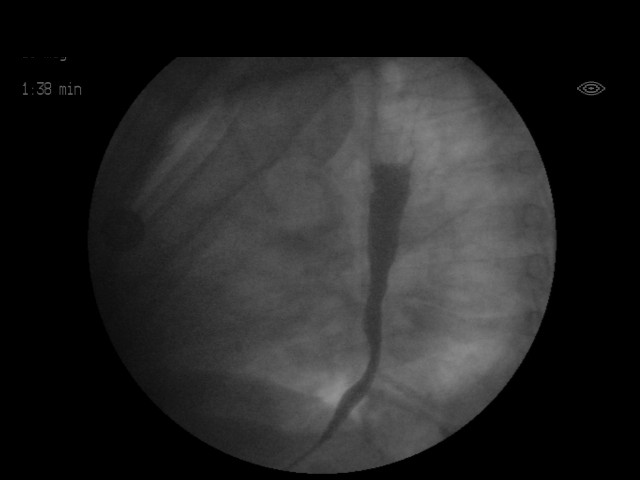

[18 of 24 positions shown; findings below may reference images not displayed]

FLUOROSCOPY FOR SWALLOWING FUNCTION STUDY:
Fluoroscopy was provided for swallowing function study, which was administered by a speech pathologist.  Final results and recommendations from this study are contained within the speech pathology report.
